# Patient Record
Sex: Male | Born: 1940 | Race: White | Hispanic: No | Marital: Married | Smoking: Former smoker
Health system: Southern US, Community
[De-identification: ages and names within clinical notes are randomized; demographics above are authoritative.]

## PROBLEM LIST (undated history)

## (undated) DIAGNOSIS — F329 Major depressive disorder, single episode, unspecified: Secondary | ICD-10-CM

## (undated) DIAGNOSIS — M1990 Unspecified osteoarthritis, unspecified site: Secondary | ICD-10-CM

## (undated) DIAGNOSIS — K220 Achalasia of cardia: Secondary | ICD-10-CM

## (undated) DIAGNOSIS — K449 Diaphragmatic hernia without obstruction or gangrene: Secondary | ICD-10-CM

## (undated) DIAGNOSIS — I77810 Thoracic aortic ectasia: Secondary | ICD-10-CM

## (undated) DIAGNOSIS — E039 Hypothyroidism, unspecified: Secondary | ICD-10-CM

## (undated) DIAGNOSIS — F32A Depression, unspecified: Secondary | ICD-10-CM

## (undated) DIAGNOSIS — M8580 Other specified disorders of bone density and structure, unspecified site: Secondary | ICD-10-CM

## (undated) DIAGNOSIS — J189 Pneumonia, unspecified organism: Secondary | ICD-10-CM

## (undated) DIAGNOSIS — Z8719 Personal history of other diseases of the digestive system: Secondary | ICD-10-CM

## (undated) DIAGNOSIS — I4510 Unspecified right bundle-branch block: Secondary | ICD-10-CM

## (undated) DIAGNOSIS — I341 Nonrheumatic mitral (valve) prolapse: Secondary | ICD-10-CM

## (undated) DIAGNOSIS — K8020 Calculus of gallbladder without cholecystitis without obstruction: Secondary | ICD-10-CM

## (undated) DIAGNOSIS — I639 Cerebral infarction, unspecified: Secondary | ICD-10-CM

## (undated) DIAGNOSIS — J302 Other seasonal allergic rhinitis: Secondary | ICD-10-CM

## (undated) DIAGNOSIS — Z9889 Other specified postprocedural states: Secondary | ICD-10-CM

## (undated) DIAGNOSIS — R911 Solitary pulmonary nodule: Secondary | ICD-10-CM

## (undated) DIAGNOSIS — G459 Transient cerebral ischemic attack, unspecified: Secondary | ICD-10-CM

## (undated) DIAGNOSIS — E7800 Pure hypercholesterolemia, unspecified: Secondary | ICD-10-CM

## (undated) DIAGNOSIS — K219 Gastro-esophageal reflux disease without esophagitis: Secondary | ICD-10-CM

## (undated) DIAGNOSIS — F419 Anxiety disorder, unspecified: Secondary | ICD-10-CM

## (undated) DIAGNOSIS — Z8379 Family history of other diseases of the digestive system: Secondary | ICD-10-CM

## (undated) DIAGNOSIS — F1021 Alcohol dependence, in remission: Secondary | ICD-10-CM

## (undated) DIAGNOSIS — J449 Chronic obstructive pulmonary disease, unspecified: Secondary | ICD-10-CM

## (undated) HISTORY — DX: Chronic obstructive pulmonary disease, unspecified: J44.9

## (undated) HISTORY — PX: CHOLECYSTECTOMY: SHX55

## (undated) HISTORY — DX: Hypothyroidism, unspecified: E03.9

## (undated) HISTORY — DX: Personal history of other diseases of the digestive system: Z87.19

## (undated) HISTORY — PX: FOOT SURGERY: SHX648

## (undated) HISTORY — DX: Unspecified right bundle-branch block: I45.10

## (undated) HISTORY — DX: Pure hypercholesterolemia, unspecified: E78.00

## (undated) HISTORY — PX: BACK SURGERY: SHX140

## (undated) HISTORY — DX: Solitary pulmonary nodule: R91.1

## (undated) HISTORY — PX: ESOPHAGOSCOPY W/ BOTOX INJECTION: SHX1533

## (undated) HISTORY — DX: Anxiety disorder, unspecified: F41.9

## (undated) HISTORY — DX: Thoracic aortic ectasia: I77.810

## (undated) HISTORY — DX: Pneumonia, unspecified organism: J18.9

## (undated) HISTORY — DX: Other specified postprocedural states: Z98.890

## (undated) HISTORY — DX: Alcohol dependence, in remission: F10.21

## (undated) HISTORY — DX: Nonrheumatic mitral (valve) prolapse: I34.1

## (undated) HISTORY — DX: Family history of other diseases of the digestive system: Z83.79

## (undated) HISTORY — PX: OTHER SURGICAL HISTORY: SHX169

## (undated) HISTORY — DX: Transient cerebral ischemic attack, unspecified: G45.9

## (undated) HISTORY — DX: Achalasia of cardia: K22.0

## (undated) HISTORY — DX: Calculus of gallbladder without cholecystitis without obstruction: K80.20

## (undated) HISTORY — DX: Other seasonal allergic rhinitis: J30.2

## (undated) HISTORY — DX: Other specified disorders of bone density and structure, unspecified site: M85.80

---

## 1997-11-29 ENCOUNTER — Inpatient Hospital Stay (HOSPITAL_COMMUNITY): Admission: RE | Admit: 1997-11-29 | Discharge: 1997-11-30 | Payer: Self-pay | Admitting: *Deleted

## 1998-02-07 ENCOUNTER — Ambulatory Visit (HOSPITAL_COMMUNITY): Admission: RE | Admit: 1998-02-07 | Discharge: 1998-02-07 | Payer: Self-pay

## 1998-02-18 ENCOUNTER — Ambulatory Visit (HOSPITAL_COMMUNITY): Admission: RE | Admit: 1998-02-18 | Discharge: 1998-02-18 | Payer: Self-pay | Admitting: *Deleted

## 1998-03-06 ENCOUNTER — Ambulatory Visit (HOSPITAL_COMMUNITY): Admission: RE | Admit: 1998-03-06 | Discharge: 1998-03-06 | Payer: Self-pay | Admitting: *Deleted

## 1999-10-28 ENCOUNTER — Encounter: Payer: Self-pay | Admitting: Critical Care Medicine

## 1999-10-29 ENCOUNTER — Ambulatory Visit: Admission: RE | Admit: 1999-10-29 | Discharge: 1999-10-29 | Payer: Self-pay | Admitting: Critical Care Medicine

## 1999-11-05 ENCOUNTER — Encounter (HOSPITAL_COMMUNITY): Admission: RE | Admit: 1999-11-05 | Discharge: 2000-02-03 | Payer: Self-pay | Admitting: Critical Care Medicine

## 1999-12-16 ENCOUNTER — Ambulatory Visit (HOSPITAL_COMMUNITY): Admission: RE | Admit: 1999-12-16 | Discharge: 1999-12-16 | Payer: Self-pay | Admitting: Gastroenterology

## 1999-12-16 ENCOUNTER — Encounter: Payer: Self-pay | Admitting: Gastroenterology

## 1999-12-29 ENCOUNTER — Encounter: Admission: RE | Admit: 1999-12-29 | Discharge: 1999-12-29 | Payer: Self-pay | Admitting: *Deleted

## 1999-12-29 ENCOUNTER — Encounter: Payer: Self-pay | Admitting: *Deleted

## 2000-02-04 ENCOUNTER — Encounter (HOSPITAL_COMMUNITY): Admission: RE | Admit: 2000-02-04 | Discharge: 2000-05-04 | Payer: Self-pay | Admitting: Critical Care Medicine

## 2000-02-16 ENCOUNTER — Encounter: Payer: Self-pay | Admitting: General Surgery

## 2000-02-18 ENCOUNTER — Observation Stay (HOSPITAL_COMMUNITY): Admission: RE | Admit: 2000-02-18 | Discharge: 2000-02-19 | Payer: Self-pay | Admitting: General Surgery

## 2000-02-18 ENCOUNTER — Encounter (INDEPENDENT_AMBULATORY_CARE_PROVIDER_SITE_OTHER): Payer: Self-pay | Admitting: Specialist

## 2000-08-05 ENCOUNTER — Encounter: Payer: Self-pay | Admitting: Critical Care Medicine

## 2000-08-05 ENCOUNTER — Ambulatory Visit (HOSPITAL_COMMUNITY): Admission: RE | Admit: 2000-08-05 | Discharge: 2000-08-05 | Payer: Self-pay | Admitting: Critical Care Medicine

## 2002-10-15 DIAGNOSIS — I639 Cerebral infarction, unspecified: Secondary | ICD-10-CM

## 2002-10-15 HISTORY — DX: Cerebral infarction, unspecified: I63.9

## 2002-10-23 ENCOUNTER — Inpatient Hospital Stay (HOSPITAL_COMMUNITY): Admission: EM | Admit: 2002-10-23 | Discharge: 2002-10-24 | Payer: Self-pay | Admitting: Emergency Medicine

## 2002-10-23 ENCOUNTER — Encounter: Payer: Self-pay | Admitting: Emergency Medicine

## 2002-10-24 ENCOUNTER — Encounter: Payer: Self-pay | Admitting: Neurology

## 2003-04-18 ENCOUNTER — Encounter: Payer: Self-pay | Admitting: Internal Medicine

## 2003-04-18 ENCOUNTER — Encounter: Admission: RE | Admit: 2003-04-18 | Discharge: 2003-04-18 | Payer: Self-pay | Admitting: Internal Medicine

## 2004-05-06 ENCOUNTER — Ambulatory Visit (HOSPITAL_COMMUNITY): Admission: RE | Admit: 2004-05-06 | Discharge: 2004-05-06 | Payer: Self-pay | Admitting: Gastroenterology

## 2004-12-09 ENCOUNTER — Ambulatory Visit (HOSPITAL_COMMUNITY): Admission: RE | Admit: 2004-12-09 | Discharge: 2004-12-09 | Payer: Self-pay | Admitting: Gastroenterology

## 2005-07-28 ENCOUNTER — Emergency Department (HOSPITAL_COMMUNITY): Admission: EM | Admit: 2005-07-28 | Discharge: 2005-07-28 | Payer: Self-pay | Admitting: *Deleted

## 2006-07-19 ENCOUNTER — Encounter: Admission: RE | Admit: 2006-07-19 | Discharge: 2006-07-19 | Payer: Self-pay | Admitting: Gastroenterology

## 2006-08-03 ENCOUNTER — Ambulatory Visit (HOSPITAL_COMMUNITY): Admission: RE | Admit: 2006-08-03 | Discharge: 2006-08-03 | Payer: Self-pay | Admitting: Gastroenterology

## 2006-10-26 ENCOUNTER — Ambulatory Visit (HOSPITAL_COMMUNITY): Admission: RE | Admit: 2006-10-26 | Discharge: 2006-10-26 | Payer: Self-pay | Admitting: Gastroenterology

## 2007-07-12 ENCOUNTER — Ambulatory Visit (HOSPITAL_COMMUNITY): Admission: RE | Admit: 2007-07-12 | Discharge: 2007-07-12 | Payer: Self-pay | Admitting: Gastroenterology

## 2007-08-02 ENCOUNTER — Ambulatory Visit (HOSPITAL_COMMUNITY): Admission: RE | Admit: 2007-08-02 | Discharge: 2007-08-02 | Payer: Self-pay | Admitting: Gastroenterology

## 2008-01-24 ENCOUNTER — Ambulatory Visit (HOSPITAL_COMMUNITY): Admission: RE | Admit: 2008-01-24 | Discharge: 2008-01-24 | Payer: Self-pay | Admitting: Gastroenterology

## 2008-09-11 ENCOUNTER — Ambulatory Visit (HOSPITAL_COMMUNITY): Admission: RE | Admit: 2008-09-11 | Discharge: 2008-09-11 | Payer: Self-pay | Admitting: Gastroenterology

## 2009-03-05 ENCOUNTER — Ambulatory Visit (HOSPITAL_COMMUNITY): Admission: RE | Admit: 2009-03-05 | Discharge: 2009-03-05 | Payer: Self-pay | Admitting: Gastroenterology

## 2009-07-29 ENCOUNTER — Ambulatory Visit (HOSPITAL_COMMUNITY): Admission: RE | Admit: 2009-07-29 | Discharge: 2009-07-29 | Payer: Self-pay | Admitting: Gastroenterology

## 2009-12-19 DIAGNOSIS — I10 Essential (primary) hypertension: Secondary | ICD-10-CM

## 2009-12-19 DIAGNOSIS — K219 Gastro-esophageal reflux disease without esophagitis: Secondary | ICD-10-CM

## 2009-12-19 DIAGNOSIS — J449 Chronic obstructive pulmonary disease, unspecified: Secondary | ICD-10-CM

## 2009-12-19 DIAGNOSIS — F329 Major depressive disorder, single episode, unspecified: Secondary | ICD-10-CM

## 2009-12-19 DIAGNOSIS — J301 Allergic rhinitis due to pollen: Secondary | ICD-10-CM

## 2009-12-19 DIAGNOSIS — I080 Rheumatic disorders of both mitral and aortic valves: Secondary | ICD-10-CM

## 2009-12-19 DIAGNOSIS — F419 Anxiety disorder, unspecified: Secondary | ICD-10-CM

## 2009-12-19 DIAGNOSIS — G459 Transient cerebral ischemic attack, unspecified: Secondary | ICD-10-CM | POA: Insufficient documentation

## 2009-12-19 DIAGNOSIS — E785 Hyperlipidemia, unspecified: Secondary | ICD-10-CM

## 2009-12-22 ENCOUNTER — Ambulatory Visit: Payer: Self-pay | Admitting: Critical Care Medicine

## 2009-12-29 ENCOUNTER — Encounter: Payer: Self-pay | Admitting: Critical Care Medicine

## 2010-01-01 ENCOUNTER — Ambulatory Visit (HOSPITAL_COMMUNITY)
Admission: RE | Admit: 2010-01-01 | Discharge: 2010-01-01 | Payer: Self-pay | Source: Home / Self Care | Admitting: Gastroenterology

## 2010-01-06 ENCOUNTER — Telehealth (INDEPENDENT_AMBULATORY_CARE_PROVIDER_SITE_OTHER): Payer: Self-pay | Admitting: *Deleted

## 2010-01-07 ENCOUNTER — Ambulatory Visit: Payer: Self-pay | Admitting: Critical Care Medicine

## 2010-02-03 ENCOUNTER — Ambulatory Visit: Payer: Self-pay | Admitting: Critical Care Medicine

## 2010-04-21 ENCOUNTER — Ambulatory Visit: Payer: Self-pay | Admitting: Critical Care Medicine

## 2010-05-07 ENCOUNTER — Telehealth: Payer: Self-pay | Admitting: Critical Care Medicine

## 2010-05-27 ENCOUNTER — Encounter: Payer: Self-pay | Admitting: Critical Care Medicine

## 2010-06-19 ENCOUNTER — Telehealth: Payer: Self-pay | Admitting: Emergency Medicine

## 2010-07-02 ENCOUNTER — Ambulatory Visit (HOSPITAL_COMMUNITY): Admission: RE | Admit: 2010-07-02 | Discharge: 2010-07-02 | Payer: Self-pay | Admitting: Gastroenterology

## 2010-07-08 ENCOUNTER — Telehealth: Payer: Self-pay | Admitting: Critical Care Medicine

## 2010-07-15 ENCOUNTER — Ambulatory Visit: Payer: Self-pay | Admitting: Critical Care Medicine

## 2010-08-16 HISTORY — PX: OTHER SURGICAL HISTORY: SHX169

## 2010-09-15 ENCOUNTER — Ambulatory Visit
Admission: RE | Admit: 2010-09-15 | Discharge: 2010-09-15 | Payer: Self-pay | Source: Home / Self Care | Attending: Critical Care Medicine | Admitting: Critical Care Medicine

## 2010-09-15 NOTE — Miscellaneous (Signed)
Summary: PFTs from 10/1999   Pulmonary Function Test Date: 10/29/1999 Height (in.): 69 Gender: Male  Pre-Spirometry FVC    Value: 3.56 L/min   Pred: 4.19 L/min     % Pred: 85 % FEV1    Value: 2.13 L     Pred: 3.29 L     % Pred: 65 % FEV1/FVC  Value: 60 %     Pred: 80 %    FEF 25-75  Value: 0.80 L/min   Pred: 3.44 L/min     % Pred: 23 %  Post-Spirometry FVC    Value: 3.64 L/min   Pred: 4.19 L/min     % Pred: 87 % FEV1    Value: 2.05 L     Pred: 3.29 L     % Pred: 62 % FEV1/FVC  Value: 56 %     Pred: 80 %    FEF 25-75  Value: 0.69 L/min   Pred: 3.44 L/min     % Pred: 20 %  Lung Volumes TLC    Value: 6.27 L   % Pred: 98 % RV    Value: 2.71 L   % Pred: 118 % DLCO    Value: 14.3 %   % Pred: 62 % DLCO/VA  Value: 2.94 %   % Pred: 78 % Clinical Lists Changes  Observations: Added new observation of DLCO/VA%EXP: 78 % (12/29/2009 14:54) Added new observation of DLCO/VA: 2.94 % (12/29/2009 14:54) Added new observation of DLCO % EXPEC: 62 % (12/29/2009 14:54) Added new observation of DLCO: 14.3 % (12/29/2009 14:54) Added new observation of RV % EXPECT: 118 % (12/29/2009 14:54) Added new observation of RV: 2.71 L (12/29/2009 14:54) Added new observation of TLC % EXPECT: 98 % (12/29/2009 14:54) Added new observation of TLC: 6.27 L (12/29/2009 14:54) Added new observation of FEF2575%EXPS: 20 % (12/29/2009 14:54) Added new observation of PSTFEF25/75P: 3.44  (12/29/2009 14:54) Added new observation of PSTFEF25/75%: 0.69 L/min (12/29/2009 14:54) Added new observation of FEV1FVCPRDPS: 80 % (12/29/2009 14:54) Added new observation of PSTFEV1/FVC: 56 % (12/29/2009 14:54) Added new observation of POSTFEV1%PRD: 62 % (12/29/2009 14:54) Added new observation of FEV1PRDPST: 3.29 L (12/29/2009 14:54) Added new observation of POST FEV1: 2.05 L/min (12/29/2009 14:54) Added new observation of POST FVC%EXP: 87 % (12/29/2009 14:54) Added new observation of FVCPRDPST: 4.19 L/min (12/29/2009 14:54) Added new  observation of POST FVC: 3.64 L (12/29/2009 14:54) Added new observation of FEF % EXPEC: 23 % (12/29/2009 14:54) Added new observation of FEF25-75%PRE: 3.44 L/min (12/29/2009 14:54) Added new observation of FEF 25-75%: 0.80 L/min (12/29/2009 14:54) Added new observation of FEV1/FVC PRE: 80 % (12/29/2009 14:54) Added new observation of FEV1/FVC: 60 % (12/29/2009 14:54) Added new observation of FEV1 % EXP: 65 % (12/29/2009 14:54) Added new observation of FEV1 PREDICT: 3.29 L (12/29/2009 14:54) Added new observation of FEV1: 2.13 L (12/29/2009 14:54) Added new observation of FVC % EXPECT: 85 % (12/29/2009 14:54) Added new observation of FVC PREDICT: 4.19 L (12/29/2009 14:54) Added new observation of FVC: 3.56 L (12/29/2009 14:54) Added new observation of PFT HEIGHT: 69  (12/29/2009 14:54) Added new observation of PFT DATE: 10/29/1999  (12/29/2009 14:54)

## 2010-09-15 NOTE — Progress Notes (Signed)
Summary: Alpha 1 test results  Phone Note Outgoing Call   Call placed by: Gweneth Dimitri RN,  May 07, 2010 9:09 AM Call placed to: Patient Summary of Call: Alpha 1 test results received.  Results  MM --  normal.   Pt aware test results normal and verbalized his understanding.   Initial call taken by: Gweneth Dimitri RN,  May 07, 2010 9:11 AM

## 2010-09-15 NOTE — Miscellaneous (Signed)
Summary: Alpha 1 Antitrypsin Results  Clinical Lists Changes  Observations: Added new observation of A-1 ANTITRYP: MM (05/27/2010 12:01)

## 2010-09-15 NOTE — Progress Notes (Signed)
Summary: [facility]  [facility]   Imported By: Lester Maybee 01/05/2010 07:29:01  _____________________________________________________________________  External Attachment:    Type:   Image     Comment:   External Document

## 2010-09-15 NOTE — Progress Notes (Signed)
Summary: yellow mucus / cough  Phone Note Call from Patient Call back at [phone]   Caller: Patient Call For: wright Summary of Call: yellow mucus tickling  in throat with  cough [facility] [address] Initial call taken by: Rickard Patience,  Jan 06, 2010 1:47 PM  Follow-up for Phone Call        called and spoke with pt. pt states he saw PW for a pulmonary consult on 12-22-2009.  Pt now c/o tickle in throat and states he "coughs so hard it makes his head hurt."  Pt states he will cough up yellow sputum but states it's difficult to get up.  Pt also c/o nasal congestion and yellow nasal drainage.  Pt denies increased sob.  Pt states PW also wanted a copy of CXR on a disc for him to review.  Pt states he is going to pick up disc this afternoon.  I offered pt an appt today with TP to address cough/congestion.  Pt was ok to wait until tomorrow and see PW instead so he can also have PW look at cxr films.  Pt scheduled to see PW tomorrow at 3:20pm.  Arman Filter LPN  Jan 06, 2010 2:42 PM

## 2010-09-15 NOTE — Assessment & Plan Note (Signed)
Summary: Pulmonary OV   Copy to:  Dr. Kirby Funk Primary Provider/Referring Provider:  Dr. Kirby Funk  CC:  Acute Visit.  On Aug 29 and 30 and pt c/o having high fever and coughing up a lot of mucus with streaks of bright red blood.  Still coughing - now prod with yellow mucus and having hoarseness.  No changes in breathing.Chris Lewis  History of Present Illness: Pulmonary OV       This is a 70 year old man with COPD   April 21, 2010 1:37 PM After mowing and weedeating started coughing and felt congested.  Had fever, then got better but still cough but not bloody, mucus is now yellow.  Sinuses are draining, and redness to membranes.  No fever now.  Pt getting botox injections for reflux and esophageal spasticity.     Preventive Screening-Counseling & Management  Alcohol-Tobacco     Smoking Status: quit > 6 months     Year Quit: 1999     Pack years: 30  Current Medications (verified): 1)  Dexilant 60 Mg Cpdr (Dexlansoprazole) .Chris Lewis.. 1 At Bedtime 2)  Klonopin 0.5 Mg Tabs (Clonazepam) .Chris Lewis.. 1 At Bedtime 3)  Caltrate 600+d Plus 600-400 Mg-Unit Tabs (Calcium Carbonate-Vit D-Min) .Chris Lewis.. 1 Two Times A Day 4)  Lutein 6 Mg Caps (Lutein) .Chris Lewis.. 1 Once Daily 5)  Multivitamins  Tabs (Multiple Vitamin) .Chris Lewis.. 1 Once Daily 6)  Aspirin 81 Mg Tbec (Aspirin) .Chris Lewis.. 1 Once Daily 7)  Metrogel 1 % Gel (Metronidazole) .... As Directed 8)  Simvastatin 40 Mg Tabs (Simvastatin) .... Take 1 Tablet By Mouth Once A Day 9)  Tums E-X 750 750 Mg Chew (Calcium Carbonate Antacid) .... As Needed 10)  Spiriva Handihaler 18 Mcg  Caps (Tiotropium Bromide Monohydrate) .... Two Puffs in Handihaler Daily 11)  Ventolin Hfa 108 (90 Base) Mcg/act  Aers (Albuterol Sulfate) .Chris Lewis.. 1-2 Puffs Every 4-6 Hours As Needed 12)  Benzonatate 100 Mg Caps (Benzonatate) .... Take One - Two By Mouth Every 4-6 Hours As Needed Cough 13)  Fluticasone Propionate 50 Mcg/act Susp (Fluticasone Propionate) .... Two Sprays Each Nostril Daily 14)  Astepro  0.15 % Soln (Azelastine Hcl) .... Two Sprays Each Nostril Daily  Allergies (verified): 1)  ! Lipitor (Atorvastatin) 2)  ! * Ivp Dye 3)  * Crestor  Past History:  Past medical, surgical, family and social histories (including risk factors) reviewed, and no changes noted (except as noted below).  Past Medical History: Reviewed history from 12/22/2009 and no changes required. 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 Cholecystectomy 2001 back surgery x 3 hernia surgery 1959 hernia repair 2005 left foot surgery 2009  Family History: Reviewed history from 12/22/2009 and no changes required. asthma- son heart disease - father, sister Lung Ca - sister  Social History: Reviewed history from 12/22/2009 and no changes required. Patient states former smoker.  Quit in Jan 1999.  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  The patient denies shortness of breath at rest, non-productive cough, coughing up blood, irregular heartbeats, indigestion, loss of appetite, weight change, abdominal pain,  difficulty swallowing, sore throat, tooth/dental problems, headaches, sneezing, itching, ear ache, anxiety, depression, hand/feet swelling, joint stiffness or pain, rash, and fever.    Vital Signs:  Patient profile:   71 year old male Height:      69 inches Weight:      170.50 pounds BMI:     25.27 O2 Sat:      96 % on Room air Temp:     98.1 degrees F  oral Pulse rate:   61 / minute BP sitting:   118 / 84  (left arm) Cuff size:   regular  Vitals Entered By: Gweneth Dimitri RN (April 21, 2010 1:30 PM)  O2 Flow:  Room air CC: Acute Visit.  On Aug 29 and 30, pt c/o having high fever and coughing up a lot of mucus with streaks of bright red blood.  Still coughing - now prod with yellow mucus and having hoarseness.  No changes in breathing. Comments Medications reviewed with patient Daytime contact number verified with patient. Gweneth Dimitri RN  April 21, 2010 1:30 PM    Physical Exam  Additional Exam:  Gen: Pleasant, well-nourished, in no distress,  normal affect ENT: No lesions,  mouth severe erythema,  oropharynx clear, no postnasal drip Neck: No JVD, no TMG, no carotid bruits Lungs: No use of accessory muscles, no dullness to percussion, few exp wheezes, distant BS Cardiovascular: RRR, heart sounds normal, no murmur or gallops, no peripheral edema Abdomen: soft and NT, no HSM,  BS normal, abd protuberant Musculoskeletal: No deformities, no cyanosis or clubbing Neuro: alert, non focal Skin: Warm, no lesions or rashes    Impression & Recommendations:  Problem # 1:  COPD (ICD-496) Assessment Deteriorated copd with acute bronchitic flare plan pulse prednisone for 8days avelox x 5days flu vaccine today chk alpha one antitrypsin assay   Medications Added to Medication List This Visit: 1)  Avelox 400 Mg Tabs (Moxifloxacin hcl) .... By mouth daily 2)  Prednisone 10 Mg Tabs (Prednisone) .... Take as directed take 4 daily for two days, then 3 daily for two days, then two daily for two days then one daily for two days then stop  Complete Medication List: 1)  Dexilant 60 Mg Cpdr (Dexlansoprazole) .Chris Lewis.. 1 at bedtime 2)  Klonopin 0.5 Mg Tabs (Clonazepam) .Chris Lewis.. 1 at bedtime 3)  Caltrate 600+d Plus 600-400 Mg-unit Tabs (Calcium carbonate-vit d-min) .Chris Lewis.. 1 two times a day 4)  Lutein 6 Mg Caps (Lutein) .Chris Lewis.. 1 once daily 5)   Multivitamins Tabs (Multiple vitamin) .Chris Lewis.. 1 once daily 6)  Aspirin 81 Mg Tbec (Aspirin) .Chris Lewis.. 1 once daily 7)  Metrogel 1 % Gel (Metronidazole) .... As directed 8)  Simvastatin 40 Mg Tabs (Simvastatin) .... Take 1 tablet by mouth once a day 9)  Tums E-x 750 750 Mg Chew (Calcium carbonate antacid) .... As needed 10)  Spiriva Handihaler 18 Mcg Caps (Tiotropium bromide monohydrate) .... Two puffs in handihaler daily 11)  Ventolin Hfa 108 (90 Base) Mcg/act Aers (Albuterol sulfate) .Chris Lewis.. 1-2 puffs every 4-6 hours as needed 12)  Benzonatate 100 Mg Caps (Benzonatate) .... Take one - two by mouth every 4-6 hours as needed cough 13)  Fluticasone Propionate 50 Mcg/act Susp (Fluticasone propionate) .... Two sprays each nostril daily 14)  Astepro 0.15 % Soln (Azelastine hcl) .... Two sprays each nostril daily 15)  Avelox 400 Mg Tabs (Moxifloxacin hcl) .... By mouth daily 16)  Prednisone 10 Mg Tabs (Prednisone) .... Take as directed take 4 daily  for two days, then 3 daily for two days, then two daily for two days then one daily for two days then stop  Other Orders: Est. Patient Level IV (04540) Admin 1st Vaccine (98119) Flu Vaccine 4yrs + (14782)  Patient Instructions: 1)  A flu vaccine will be obtained 2)  We will check the alpha one antitrypsin test 3)  Prednisone 10mg  Take 4 daily for two days, then 3 daily for two days, then two daily for two days then one daily for two days then stop 4)  Avelox one daily for 5 days (4 sent to pharmacy plus one sample 5)  No other medication change 6)  Return 2 months Prescriptions: PREDNISONE 10 MG  TABS (PREDNISONE) Take as directed Take 4 daily for two days, then 3 daily for two days, then two daily for two days then one daily for two days then stop  #20 x 0   Entered and Authorized by:   Storm Frisk MD   Signed by:   Storm Frisk MD on 04/21/2010   Method used:   [facility]ronically to        [id number]       [address]        [place], [place]  [address]       Ph: [phone] or [phone]       Fax: [phone]   RxID:   [id number] AVELOX 400 MG  TABS (MOXIFLOXACIN HCL) By mouth daily  #4 x 0   Entered and Authorized by:   Storm Frisk MD   Signed by:   Storm Frisk MD on 04/21/2010   Method used:   [facility] to        [id number]       [address]       [place], [place]  [address]       Ph: [phone] or [phone]       Fax: [phone]   RxID:   [id number]    Immunization History:  Influenza Immunization History:    Influenza:  historical (05/16/2009)   Prevention & Chronic Care Immunizations   Influenza vaccine: Fluvax 3+  (04/21/2010)    Tetanus booster: Not documented    Pneumococcal vaccine: Not documented    H. zoster vaccine: Not documented  Colorectal Screening   Hemoccult: Not documented    Colonoscopy: Not documented  Other Screening   PSA: Not documented   Smoking status: quit > 6 months  (04/21/2010)  Lipids   Total Cholesterol: Not documented   LDL: Not documented   LDL Direct: Not documented   HDL: Not documented   Triglycerides: Not documented    SGOT (AST): Not documented   SGPT (ALT): Not documented   Alkaline phosphatase: Not documented   Total bilirubin: Not documented  Hypertension   Last Blood Pressure: 118 / 84  (04/21/2010)   Serum creatinine: Not documented   Serum potassium Not documented  Self-Management Support :    Hypertension self-management support: Not documented    Lipid self-management support: Not documented    Nursing Instructions: Give Flu vaccine today    Flu Vaccine Consent Questions     Do you have a history of severe allergic reactions to this vaccine? no    Any prior history of allergic reactions to egg and/or gelatin? no    Do you have  a sensitivity to the preservative Thimersol? no    Do you have a past history of Guillan-Barre  Syndrome? no    Do you currently have an acute febrile illness? no    Have you ever had a severe reaction to latex? no    Vaccine information given and explained to patient? yes    Are you currently pregnant? no    Lot Number:[id number]   Exp Date:02/13/2011   Site Given  Left [email or web address].sign  IM .lbflu Gweneth Dimitri RN  April 21, 2010 2:09 PM  Appended Document: Pulmonary OV fax Jonny Ruiz griffin

## 2010-09-15 NOTE — Progress Notes (Signed)
Summary: congestion  Phone Note Call from Patient   Caller: Patient Call For: Roise Emert Summary of Call: head congestion yellow and green. he is not on antibiotic . [facility] [address] Initial call taken by: Rickard Patience,  July 08, 2010 10:38 AM  Follow-up for Phone Call        The patient c/o recurring head and chest congestion. Mucus is yellow to green in color x2-3 days. He denies any increased SOB, wheezing, sore throat of fever. He is using loratadine 10mg  as needed and has started back on fluticasone and astepro daily. Pt is sch for OV with PW on Wed., 11/30.Marland Kitchen He wants to know if he should have Pred taper or abx before OV. Please advise. Follow-up by: Michel Bickers CMA,  July 08, 2010 10:58 AM  Additional Follow-up for Phone Call Additional follow up Details #1::        rx avelox 400mg /d x 5days prednisone 10mg  Take 4 daily for two days, then 3 daily for two days, then two daily for two days then one daily for two days then stop keep 11/30 ov  Additional Follow-up by: Storm Frisk MD,  July 08, 2010 1:14 PM    Additional Follow-up for Phone Call Additional follow up Details #2::    pt advised of recs and to keep appt. rx sent. Carron Curie CMA  July 08, 2010 1:20 PM   New/Updated Medications: PREDNISONE 10 MG TABS (PREDNISONE) Take 4 daily for two days, then 3 daily for two days, then two daily for two days then one daily for two days then stop AVELOX 400 MG TABS (MOXIFLOXACIN HCL) Take 1 tablet by mouth once a day Prescriptions: AVELOX 400 MG TABS (MOXIFLOXACIN HCL) Take 1 tablet by mouth once a day  #5 x 0   Entered by:   Carron Curie CMA   Authorized by:   Storm Frisk MD   Signed by:   Carron Curie CMA on 07/08/2010   Method used:   [facility]ically to        [id number]       [address]       [place], [place]  [address]       Ph: [phone] or [phone]       Fax: [phone]   RxID:    [id number] PREDNISONE 10 MG TABS (PREDNISONE) Take 4 daily for two days, then 3 daily for two days, then two daily for two days then one daily for two days then stop  #20 x 0   Entered by:   Carron Curie CMA   Authorized by:   Storm Frisk MD   Signed by:   Carron Curie CMA on 07/08/2010   Method used:   [facility]ically to        [id number]       [address]       [place], [place]  [address]       Ph: [phone] or [phone]       Fax: [phone]   RxID:   [id number]

## 2010-09-15 NOTE — Assessment & Plan Note (Signed)
Summary: Pulmonary OV   Copy to:  Dr. Kirby Funk Primary Montee Tallman/Referring Raegan Sipp:  Dr. Kirby Funk  CC:  Follow up.  chest congestion - difficult to get up and affects breathing.  also c/o dull chest pain - worse this am..  History of Present Illness: Pulmonary OV       This is a 70 year old man with COPD    July 15, 2010 2:28 PM Was getting better, then awoke 4am with diarrhea and hurt all over.   Wife is ill as well. Also other family members with same illness. Was ok prior to this. Called in on 11/23 and rx pred and avelox and this helped.  Notes dry eyes. Just had botox injection of esophagus on 07/02/10 Dysphagia is better but heartburn is worse     Current Medications (verified): 1)  Dexilant 60 Mg Cpdr (Dexlansoprazole) .Marland Kitchen.. 1 At Bedtime 2)  Klonopin 0.5 Mg Tabs (Clonazepam) .Marland Kitchen.. 1 At Bedtime 3)  Caltrate 600+d Plus 600-400 Mg-Unit Tabs (Calcium Carbonate-Vit D-Min) .Marland Kitchen.. 1 Two Times A Day 4)  Lutein 6 Mg Caps (Lutein) .Marland Kitchen.. 1 Once Daily 5)  Multivitamins  Tabs (Multiple Vitamin) .Marland Kitchen.. 1 Once Daily 6)  Aspirin 81 Mg Tbec (Aspirin) .Marland Kitchen.. 1 Once Daily 7)  Metrogel 1 % Gel (Metronidazole) .... As Directed 8)  Simvastatin 40 Mg Tabs (Simvastatin) .... Take 1 Tablet By Mouth Once A Day 9)  Tums E-X 750 750 Mg Chew (Calcium Carbonate Antacid) .... As Needed 10)  Spiriva Handihaler 18 Mcg  Caps (Tiotropium Bromide Monohydrate) .... Two Puffs in Handihaler Daily 11)  Ventolin Hfa 108 (90 Base) Mcg/act  Aers (Albuterol Sulfate) .Marland Kitchen.. 1-2 Puffs Every 4-6 Hours As Needed 12)  Benzonatate 100 Mg Caps (Benzonatate) .... Take One - Two By Mouth Every 4-6 Hours As Needed Cough 13)  Fluticasone Propionate 50 Mcg/act Susp (Fluticasone Propionate) .... Two Sprays Each Nostril Daily 14)  Astepro 0.15 % Soln (Azelastine Hcl) .... Two Sprays Each Nostril Daily 15)  Prednisone 10 Mg Tabs (Prednisone) .... Take 4 Daily For Two Days, Then 3 Daily For Two Days, Then Two Daily For Two Days  Then One Daily For Two Days Then Stop 16)  Avelox 400 Mg Tabs (Moxifloxacin Hcl) .... Take 1 Tablet By Mouth Once A Day 17)  Loratadine 10 Mg Tabs (Loratadine) .... Take 1 Tablet By Mouth Once A Day  Allergies (verified): 1)  ! Lipitor (Atorvastatin) 2)  ! * Ivp Dye 3)  * Crestor  Past History:  Past medical, surgical, family and social histories (including risk factors) reviewed, and no changes noted (except as noted below).  Past Medical History: Reviewed history from 12/22/2009 and no changes required. 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 Cholecystectomy 2001 back surgery x 3 hernia surgery 1959 hernia repair 2005 left foot surgery 2009  Clinical Reports Reviewed:  CXR:  11/04/2009: CXR Results:  Left base atelectasis,  chronic interstitial changes  PFT's:  12/29/2009: DLCO %Predicted:  62 FEF 25/75 %Predicted:  23 FEV1 %Predicted:  65 FVC %Predicted:  85 Post Spirometry FEF 25/75 %Predicted:  20 Post Spirometry FEV1 %Predicted:  62 Post Spirometry FVC %Predicted:  87 RV %Predicted:  118 TLC %Predicted:  98  12/22/2009: FEV1 %Predicted:  38 FEV1/FVC %Predicted:  63.70 FVC %Predicted:  59.40   Family  History: Reviewed history from 12/22/2009 and no changes required. asthma- son heart disease - father, sister Lung Ca - sister  Social History: Reviewed history from 12/22/2009 and no changes required. Patient states former smoker.  Quit in Jan 1999.  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  The patient denies non-productive cough, coughing up blood, irregular heartbeats, loss of appetite, weight change, tooth/dental problems, headaches, nasal congestion/difficulty breathing through nose, sneezing, itching, ear ache, anxiety, depression, hand/feet swelling, joint stiffness or pain, rash, change in color of mucus, and fever.    Vital Signs:  Patient profile:   70 year old male Height:      69 inches Weight:      173.38 pounds BMI:     25.70 O2 Sat:      95 % on Room air Temp:     98.2 degrees F oral Pulse rate:   70 / minute BP sitting:   114 / 80  (left arm) Cuff size:   regular  Vitals Entered By: Gweneth Dimitri RN (July 15, 2010 2:11 PM)  O2 Flow:  Room air CC: Follow up.  chest congestion - difficult to get up and affects breathing.  also c/o dull chest pain - worse this am. Comments Medications reviewed with patient Daytime contact number verified with patient. Gweneth Dimitri RN  July 15, 2010 2:13 PM    Physical Exam  Additional Exam:  Gen: Pleasant, well-nourished, in no distress,  normal affect ENT: No lesions,  mouth severe erythema,  oropharynx clear, no postnasal drip Neck: No JVD, no TMG, no carotid bruits Lungs: No use of accessory muscles, no dullness to percussion, few exp wheezes, distant BS Cardiovascular: RRR, heart sounds normal, no murmur or gallops, no peripheral edema Abdomen: soft and NT, no HSM,  BS normal, abd protuberant Musculoskeletal: No deformities, no cyanosis or clubbing Neuro: alert, non focal Skin: Warm, no lesions or rashes    Impression & Recommendations:  Problem # 1:  COPD (ICD-496) copd exacerb ppt by gerd and microaspiration plan finish current avelox/pred RX cont BDs  Problem # 2:  ESOPHAGEAL REFLUX (ICD-530.81) Assessment: Deteriorated esophageal dysfunction better with botox but GERD worse with  recent dietary indiscretion /noncompliance plan double dexilant until samples run out (10days samples given)  His updated medication list for this problem includes:    Dexilant 60 Mg Cpdr (Dexlansoprazole) .Marland Kitchen... 1 at bedtime    Tums E-x 750 750 Mg Chew (Calcium carbonate antacid) .Marland Kitchen... As needed  Orders: Est. Patient Level IV (11914)  Medications Added to Medication List This Visit: 1)  Prednisone 10 Mg Tabs (Prednisone) .... Take 4 daily for two days, then 3 daily for two days, then two daily for two days then one daily for two days then stop 2)  Avelox 400 Mg Tabs (Moxifloxacin hcl) .... Take 1 tablet by mouth once a day 3)  Loratadine 10 Mg Tabs (Loratadine) .... Take 1 tablet by mouth once a day  Patient Instructions: 1)  Finish Avelox and prednisone 2)  Follow STRICT Reflux Diet 3)  Double Dexilant one twice a day until samples gone ,then resume one daily 4)  No change in inhaled medications. 5)  Stop loratidine 6)  Stay on fluticasone/astepro 7)  Return 2 months  Appended Document: Pulmonary OV fax john griffin; Dr Randa Evens Enid Baas

## 2010-09-15 NOTE — Assessment & Plan Note (Signed)
Summary: Pulmonary OV   Copy to:  Dr. Kirby Funk Primary Provider/Referring Provider:  Dr. Kirby Funk  CC:  2 wk follow up.  Pt states he is feeling better.  Cough and tickle in throat have improved. Chris Lewis  History of Present Illness: Pulmonary OV       This is a 70 year old man with COPD   Dx with Copd.   since 2002.  Seen by Dr Delford Field 2003, records not available on this OV. PFT[facility] at [facility]  2002 ? results but showed moderate obstruction Was doing well, until resp  virus issue 2/11.  Two grandchildren ill and pt became ill.  Now not getting over the situation.  Worse since that time. Has been rx with zpak, pred, levaquin and now amoxicillin. ([facility]) Pt notes strep screen was neg.  Now on amoxicillin Not able to eat or swallow for two days,  now throat is better was rx spiriva 2 weeks and then stopped.  ? if helped Pt denies any significant sore throat, nasal congestion or excess secretions, fever, chills, sweats, unintended weight loss, pleurtic or exertional chest pain, orthopnea PND, or leg swelling Pt denies any increase in rescue therapy over baseline, denies waking up needing it or having any early am or nocturnal exacerbations of coughing/wheezing/or dyspnea. Pt also denies any obvious fluctuation in symptoms wiht weather or environmental change or other alleviating or aggravating factors  Pt with hx of severe GERD and has had multiple botox injections into distal esoph sphincter per GI MD. Still witn ongoing GERD symptoms and now on dexilant.  He is noncompliant with diet    Jan 07, 2010 3:28 PM Now is coughing more.  The pt notes a tickles in throat.   There is now more yellow mucous.   The pt had botox injection last week into distal esophagus. The pt is using cough drops,  The pt notes  if coughs hard and nose will run and is yellow out of nose,  had pain in L ear. Dyspnea is better compared to prior OV. Pt denies any significant sore throat, nasal congestion  , fever, chills, sweats, unintended weight loss, pleurtic or exertional chest pain, orthopnea PND, or leg swelling  February 03, 2010 2:09 PM At the last ov we rec: Flonase/nasonex two sprays each nostril daily Astepro two sprays each nostril daily Cyclic cough protocol A depomedrol 120mg  injection will be given Augmentin one twice daily for 7days Now cough is less,  still with some mucus that comes up.  Dyspnea is better unless if exerts self excessively The botox not helping as much.  Still has dysphagia despite botox.    Preventive Screening-Counseling & Management  Alcohol-Tobacco     Smoking Status: quit > 6 months  Current Medications (verified): 1)  Dexilant 60 Mg Cpdr (Dexlansoprazole) .Chris Lewis.. 1 At Bedtime 2)  Klonopin 0.5 Mg Tabs (Clonazepam) .Chris Lewis.. 1 At Bedtime 3)  Caltrate 600+d Plus 600-400 Mg-Unit Tabs (Calcium Carbonate-Vit D-Min) .Chris Lewis.. 1 Two Times A Day 4)  Lutein 6 Mg Caps (Lutein) .Chris Lewis.. 1 Once Daily 5)  Lexapro 10 Mg Tabs (Escitalopram Oxalate) .Chris Lewis.. 1 Once Daily 6)  Multivitamins  Tabs (Multiple Vitamin) .Chris Lewis.. 1 Once Daily 7)  Aspirin 81 Mg Tbec (Aspirin) .Chris Lewis.. 1 Once Daily 8)  Metrogel 1 % Gel (Metronidazole) .... As Directed 9)  Simvastatin 40 Mg Tabs (Simvastatin) .... Take 1 Tablet By Mouth Once A Day 10)  Tums E-X 750 750 Mg Chew (Calcium Carbonate Antacid) .Chris KitchenMarland KitchenMarland Lewis  As Needed 11)  Spiriva Handihaler 18 Mcg  Caps (Tiotropium Bromide Monohydrate) .... Two Puffs in Handihaler Daily 12)  Proair Hfa 108 (90 Base) Mcg/act  Aers (Albuterol Sulfate) .Chris Lewis.. 1-2 Puffs Every 4-6 Hours As Needed 13)  Benzonatate 100 Mg Caps (Benzonatate) .... Take One - Two By Mouth Every 4-6 Hours As Needed Cough 14)  Fluticasone Propionate 50 Mcg/act Susp (Fluticasone Propionate) .... Two Sprays Each Nostril Daily 15)  Astepro 0.15 % Soln (Azelastine Hcl) .... Two Sprays Each Nostril Daily  Allergies (verified): 1)  ! Lipitor (Atorvastatin) 2)  ! * Ivp Dye 3)  * Crestor  Past History:  Past medical,  surgical, family and social histories (including risk factors) reviewed, and no changes noted (except as noted below).  Past Medical History: Reviewed history from 12/22/2009 and no changes required. 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 Cholecystectomy 2001 back surgery x 3 hernia surgery 1959 hernia repair 2005 left foot surgery 2009  Family History: Reviewed history from 12/22/2009 and no changes required. asthma- son heart disease - father, sister Lung Ca - sister  Social History: Reviewed history from 12/22/2009 and no changes required. Patient states former smoker.  Quit in Jan 1999.  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  The patient denies shortness of breath at rest, productive cough, non-productive cough, coughing up blood, chest pain, irregular heartbeats, acid heartburn, indigestion, loss of appetite, weight change, abdominal pain, difficulty swallowing, sore throat, tooth/dental problems, headaches, nasal congestion/difficulty breathing through nose, sneezing, itching, ear ache, anxiety, depression, hand/feet swelling, joint stiffness or pain, rash, change in color of mucus, and fever.    Vital Signs:  Patient profile:   70 year old male Height:      69 inches Weight:      172 pounds BMI:     25.49 O2 Sat:      93 % on Room air Temp:     98.0 degrees F oral Pulse rate:   70 / minute BP sitting:   120 / 84  (right arm) Cuff size:   regular  Vitals Entered By: Gweneth Dimitri RN (February 03, 2010 2:03 PM)  O2 Flow:  Room air CC: 2 wk  follow up.  Pt states he is feeling better.  Cough and tickle in throat have improved.  Comments Medications reviewed with patient Daytime contact number verified with patient. Gweneth Dimitri RN  February 03, 2010 2:04 PM    Physical Exam  Additional Exam:  Gen: Pleasant, well-nourished, in no distress,  normal affect ENT: No lesions,  mouth severe erythema,  oropharynx clear, no postnasal drip Neck: No JVD, no TMG, no carotid bruits Lungs: No use of accessory muscles, no dullness to percussion, clear without rales or rhonchi, distant BS Cardiovascular: RRR, heart sounds normal, no murmur or gallops, no peripheral edema Abdomen: soft and NT, no HSM,  BS normal, abd protuberant Musculoskeletal: No deformities, no cyanosis or clubbing Neuro: alert, non focal Skin: Warm, no lesions or rashes    Impression & Recommendations:  Problem # 1:  COPD (ICD-496) Assessment Improved  Moderate COPD Golds stage III with  GERD as a major exacerbating feature.  Not oxygen dependent, improved  with reflux rx  plan reflux diet adherence cont spiriva cont dexilant daily   Problem # 2:  ALLERGIC RHINITIS, SEASONAL (ICD-477.0) Assessment: Improved stable allergic rhinitis plan cont astepro/flonase  Medications Added to Medication List This Visit: 1)  Simvastatin 40 Mg Tabs (Simvastatin) .... Take 1 tablet by mouth once a day 2)  Ventolin Hfa 108 (90 Base) Mcg/act Aers (Albuterol sulfate) .Chris Lewis.. 1-2 puffs every 4-6 hours as needed  Complete Medication List: 1)  Dexilant 60 Mg Cpdr (Dexlansoprazole) .Chris Lewis.. 1 at bedtime 2)  Klonopin 0.5 Mg Tabs (Clonazepam) .Chris Lewis.. 1 at bedtime 3)  Caltrate 600+d Plus 600-400 Mg-unit Tabs (Calcium carbonate-vit d-min) .Chris Lewis.. 1 two times a day 4)  Lutein 6 Mg Caps (Lutein) .Chris Lewis.. 1 once daily 5)  Lexapro 10 Mg Tabs (Escitalopram oxalate) .Chris Lewis.. 1 once daily 6)  Multivitamins Tabs (Multiple vitamin) .Chris Lewis.. 1 once daily 7)  Aspirin 81 Mg Tbec (Aspirin) .Chris Lewis.. 1 once daily 8)   Metrogel 1 % Gel (Metronidazole) .... As directed 9)  Simvastatin 40 Mg Tabs (Simvastatin) .... Take 1 tablet by mouth once a day 10)  Tums E-x 750 750 Mg Chew (Calcium carbonate antacid) .... As needed 11)  Spiriva Handihaler 18 Mcg Caps (Tiotropium bromide monohydrate) .... Two puffs in handihaler daily 12)  Ventolin Hfa 108 (90 Base) Mcg/act Aers (Albuterol sulfate) .Chris Lewis.. 1-2 puffs every 4-6 hours as needed 13)  Benzonatate 100 Mg Caps (Benzonatate) .... Take one - two by mouth every 4-6 hours as needed cough 14)  Fluticasone Propionate 50 Mcg/act Susp (Fluticasone propionate) .... Two sprays each nostril daily 15)  Astepro 0.15 % Soln (Azelastine hcl) .... Two sprays each nostril daily  Other Orders: Est. Patient Level III (04540)  Patient Instructions: 1)  No change in medications 2)  Return in   4       months Prescriptions: VENTOLIN HFA 108 (90 BASE) MCG/ACT  AERS (ALBUTEROL SULFATE) 1-2 puffs every 4-6 hours as needed  #1 x 6   Entered and Authorized by:   Storm Frisk MD   Signed by:   Storm Frisk MD on 02/03/2010   Method used:   Print then Give to Patient   RxID:   [id number]

## 2010-09-15 NOTE — Assessment & Plan Note (Signed)
Summary: Pulmonary Consultation   Copy to:  Dr. Kirby Funk Primary Provider/Referring Provider:  Dr. Kirby Funk  CC:  Pulmonary Consult for COPD.  and COPD initial evaluation.  History of Present Illness: Pulmonary Consultation       This is a 70 year old man who presents for COPD initial evaluation.  The patient complains of history of diagnosed COPD, shortness of breath, chest tightness, chest pain worse with breathing and coughing, cough, mucous production, nocturnal awakening, and congestion, but denies wheezing and exercise induced symptoms.  Prior evaluation and testing has included Cxr.  The dyspnea is described as with ADL's, with walking stairs, and during the day.  Associated disease(s) include(s) difficulty swallowing, sore throat, change in color of mucous, productive cough, non-productive cough, chest pain, and chest tightness.  Prior effective treatment has included tiatropium.    Dx with Copd.   since 2002.  Seen by Dr Delford Field 2003, records not available on this OV. PFTs at [facility]  2002 ? results but showed moderate obstruction Was doing well, until resp  virus issue 2/11.  Two grandchildren ill and pt became ill.  Now not getting over the situation.  Worse since that time. Has been rx with zpak, pred, levaquin and now amoxicillin. ([facility]) Pt notes strep screen was neg.  Now on amoxicillin Not able to eat or swallow for two days,  now throat is better was rx spiriva 2 weeks and then stopped.  ? if helped Pt denies any significant sore throat, nasal congestion or excess secretions, fever, chills, sweats, unintended weight loss, pleurtic or exertional chest pain, orthopnea PND, or leg swelling Pt denies any increase in rescue therapy over baseline, denies waking up needing it or having any early am or nocturnal exacerbations of coughing/wheezing/or dyspnea. Pt also denies any obvious fluctuation in symptoms wiht weather or environmental change or other alleviating  or aggravating factors  Pt with hx of severe GERD and has had multiple botox injections into distal esoph sphincter per GI MD. Still witn ongoing GERD symptoms and now on dexilant.  He is noncompliant with diet   Preventive Screening-Counseling & Management  Alcohol-Tobacco     Smoking Status: quit     Year Quit: 1999     Pack years: 93  Current Medications (verified): 1)  Dexilant 60 Mg Cpdr (Dexlansoprazole) .Marland Kitchen.. 1 Every Am 2)  Klonopin 0.5 Mg Tabs (Clonazepam) .Marland Kitchen.. 1 At Bedtime 3)  Fish Oil 1000 Mg Caps (Omega-3 Fatty Acids) .... 2 Once Daily 4)  Caltrate 600+d Plus 600-400 Mg-Unit Tabs (Calcium Carbonate-Vit D-Min) .Marland Kitchen.. 1 Two Times A Day 5)  Lutein 6 Mg Caps (Lutein) .Marland Kitchen.. 1 Once Daily 6)  Lexapro 10 Mg Tabs (Escitalopram Oxalate) .Marland Kitchen.. 1 Once Daily 7)  Multivitamins  Tabs (Multiple Vitamin) .Marland Kitchen.. 1 Once Daily 8)  Aspirin 81 Mg Tbec (Aspirin) .Marland Kitchen.. 1 Once Daily 9)  Metrogel 1 % Gel (Metronidazole) .... As Directed 10)  Veramyst 27.5 Mcg/spray Susp (Fluticasone Furoate) .... 2 Sprays Each Nostril Once Daily 11)  Pravastatin Sodium 40 Mg Tabs (Pravastatin Sodium) .Marland Kitchen.. 1 Once Daily 12)  Amoxicillin 500 Mg Caps (Amoxicillin) .... Q8h 13)  Tums E-X 750 750 Mg Chew (Calcium Carbonate Antacid) .... As Needed  Allergies (verified): 1)  ! Lipitor (Atorvastatin) 2)  ! * Ivp Dye  Past History:  Past medical, surgical, family and social histories (including risk factors) reviewed, and no changes noted (except as noted below).  Past Medical History: Hx of TIA (ICD-435.9) ESOPHAGEAL REFLUX (  ICD-530.81) MITRAL REGURGITATION (ICD-396.3) ALLERGIC RHINITIS, SEASONAL (ICD-477.0) DEPRESSION (ICD-311) HYPERTENSION, BENIGN (ICD-401.1) HYPERLIPIDEMIA (ICD-272.4) HYPERCHOLESTEROLEMIA (ICD-272.0) ANXIETY (ICD-300.00) COPD (ICD-496)  Past Surgical History: Cholecystectomy 2001 back surgery x 3 hernia surgery 1959 hernia repair 2005 left foot surgery 2009  Family History: Reviewed  history and no changes required. asthma- son heart disease - father, sister Lung Ca - sister  Social History: Reviewed history and no changes required. Patient states former smoker.  Quit in Jan 1999.  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  The patient denies coughing up blood, irregular heartbeats, indigestion, loss of appetite, weight change, tooth/dental problems, headaches, itching, ear ache, hand/feet swelling, joint stiffness or pain, rash, change in color of mucus, and fever.        See HPI for Pulmonary, Cardiac, General, and ENT review of systems.  Vital Signs:  Patient profile:   70 year old male Height:      59 inches Weight:      174 pounds BMI:     35.27 O2 Sat:      96 % on Room air Temp:     98.0 degrees F oral Pulse rate:   74 / minute BP sitting:   116 / 82  (left arm) Cuff size:   59regular  Vitals Entered By: Gweneth Dimitri RN (Dec 22, 2009 3:47 PM)  O2 Flow:  Room air CC: Pulmonary Consult for COPD. , COPD initial evaluation Comments Medications reviewed with patient Daytime contact number verified with patient. Gweneth Dimitri RN  Dec 22, 2009 3:51 PM    Physical Exam  Additional Exam:  Gen: Pleasant, well-nourished, in no distress,  normal affect ENT: No lesions,  mouth severe erythema,  oropharynx clear, no postnasal drip Neck: No JVD, no TMG, no carotid bruits Lungs: No use of accessory muscles, no dullness to percussion, clear without rales or rhonchi, distant BS Cardiovascular: RRR, heart sounds normal, no murmur or gallops, no peripheral edema Abdomen: soft and NT, no  HSM,  BS normal, abd protuberant Musculoskeletal: No deformities, no cyanosis or clubbing Neuro: alert, non focal Skin: Warm, no lesions or rashes    CXR  Procedure date:  11/04/2009  Findings:      Left base atelectasis,  chronic interstitial changes  Pulmonary Function Test Date: 12/22/2009 4:18 PM Gender: Male  Pre-Spirometry FVC    Value: 2.52 L/min   % Pred: 59.40 % FEV1    Value: 1.18 L     Pred: 3.12 L     % Pred: 38 % FEV1/FVC  Value: 46.90 %     % Pred: 63.70 %  Impression & Recommendations:  Problem # 1:  COPD (ICD-496) Assessment Deteriorated Moderate COPD Golds stage III with  GERD as a major exacerbating feature.  Not oxygen dependent plan reflux diet adherence resume spiriva d/c fish oil stay on PPI f/u with GI MD finish amoxicillin  Medications Added to Medication List This Visit: 1)  Fish Oil 1000 Mg Caps (Omega-3 fatty acids) .... 2 once daily 2)  Amoxicillin 500 Mg Caps (Amoxicillin) .... Q8h 3)  Tums E-x 750 750 Mg Chew (Calcium carbonate antacid) .... As needed 4)  Spiriva Handihaler 18 Mcg Caps (Tiotropium bromide monohydrate) .... Two puffs in handihaler daily 5)  Proair Hfa 108 (90 Base) Mcg/act Aers (Albuterol sulfate) .Marland Kitchen.. 1-2 puffs every 4-6 hours as needed  Complete Medication List: 1)  Dexilant 60 Mg Cpdr (Dexlansoprazole) .Marland Kitchen.. 1 every am 2)  Klonopin 0.5 Mg Tabs (Clonazepam) .Marland Kitchen.. 1 at bedtime 3)  Caltrate 600+d Plus 600-400 Mg-unit Tabs (Calcium carbonate-vit d-min) .Marland Kitchen.. 1 two times a day 4)  Lutein 6 Mg Caps (Lutein) .Marland Kitchen.. 1 once daily 5)  Lexapro 10 Mg Tabs (Escitalopram oxalate) .Marland Kitchen.. 1 once daily 6)  Multivitamins Tabs (Multiple vitamin) .Marland Kitchen.. 1 once daily 7)  Aspirin 81 Mg Tbec (Aspirin) .Marland Kitchen.. 1 once daily 8)  Metrogel 1 % Gel (Metronidazole) .... As directed 9)  Veramyst 27.5 Mcg/spray Susp (Fluticasone furoate) .... 2 sprays each nostril once daily 10)  Pravastatin Sodium 40 Mg Tabs (Pravastatin sodium) .Marland Kitchen.. 1 once daily 11)   Amoxicillin 500 Mg Caps (Amoxicillin) .... Q8h 12)  Tums E-x 750 750 Mg Chew (Calcium carbonate antacid) .... As needed 13)  Spiriva Handihaler 18 Mcg Caps (Tiotropium bromide monohydrate) .... Two puffs in handihaler daily 14)  Proair Hfa 108 (90 Base) Mcg/act Aers (Albuterol sulfate) .Marland Kitchen.. 1-2 puffs every 4-6 hours as needed  Other Orders: New Patient Level V (16109) Spirometry w/Graph (60454) Prescription Created Electronically [phone])  Patient Instructions: 1)  Follow Reflux diet 2)  Start Spiriva daily 3)  Stay on Dexilant daily 4)  Stop Fish oil 5)  Return two months Prescriptions: PROAIR HFA 108 (90 BASE) MCG/ACT  AERS (ALBUTEROL SULFATE) 1-2 puffs every 4-6 hours as needed  #1 x 6   Entered and Authorized by:   Storm Frisk MD   Signed by:   Storm Frisk MD on 12/22/2009   Method used:   [facility]  [id number]       [address]       [place], [place]  [address]       Ph: [phone] or [phone]       Fax: [phone]   RxID:   [id number] SPIRIVA HANDIHALER 18 MCG  CAPS (TIOTROPIUM BROMIDE MONOHYDRATE) Two puffs in handihaler daily  #30 x 6   Entered and Authorized by:   Storm Frisk MD   Signed by:   Storm Frisk MD on 12/22/2009   Method used:   [facility]        [facility]  [id number]       [address]       [place], [place]  [address]       Ph: [phone] or [phone]       Fax: [phone]   RxID:   [id number]    CardioPerfect Spirometry  ID: [id number] Patient: Chris Lewis, Chris Lewis DOB: 07-Jan-1941 Age: 70 Years Old Sex: Male Race: White Height: 59 Weight: 174 Status: Confirmed Recorded: 12/22/2009 4:18 PM  Parameter  Measured Predicted %Predicted FVC     2.52        4.24        59.40 FEV1     1.18        3.12        38 FEV1%   46.90        73.60        63.70 PEF    4.80  8.11        59.20   Comments: Severe airflow  obstruction  Interpretation: Pre: FVC= 2.52L FEV1= 1.18L FEV1%= 46.9% 1.18/2.52 FEV1/FVC (12/22/2009 4:22:18 PM), Severe obstruction.Moderately severe restriction     Appended Document: Pulmonary Consultation fax Kirby Funk, Mee Hives both @ [facility]

## 2010-09-15 NOTE — Progress Notes (Signed)
Summary: speak to nurse  Phone Note Call from Patient Call back at Home Phone [phone]   Caller: Patient Call For: wright Summary of Call: Pt c/o of blurred vision, watery eyes, some pain in this area x3 wks wants to know if it could be related to his spiriva, pls advise.//[facility] [address] Initial call taken by: Darletta Moll,  June 19, 2010 4:31 PM  Follow-up for Phone Call        pt saw Dr. Delford Field on 04-21-10 and was given avelox and prednisone for copd flare. At this time he was also c/o watery eyes. pt states all symptoms have improved except for his eyes. he states they have gotten worse and vision is now blurry and eyes are now sore. Pt wants to know could this be due to any meds that he is on. Please advise. Carron Curie CMA  June 19, 2010 4:50 PM  ? whether this reflects allergies that precipitated his COPD exacerbation. Would recommend loratadine 10mg  once daily (OTC) through the weekend, then call our office next week to report if he is better. Leslye Peer MD  June 19, 2010 5:21 PM   Follow-up by: Leslye Peer MD,  June 19, 2010 5:21 PM  Additional Follow-up for Phone Call Additional follow up Details #1::        pt advised of recs.Carron Curie CMA  June 19, 2010 5:36 PM

## 2010-09-15 NOTE — Assessment & Plan Note (Signed)
Summary: Pulmonary OV   Copy to:  Dr. Kirby Funk Primary Provider/Referring Provider:  Dr. Kirby Funk  CC:  Acute visit.  Pt c/o "tickle in throat" and cough worsening over the past several days.  He states that cough is prod with yellow sputum and also has yellow nasal d/c.  Pt states that cough seems to be worse at night especially when he lies down.Marland Kitchen  History of Present Illness: Pulmonary OV       This is a 70 year old man with COPD   Dx with Copd.   since 2002.  Seen by Dr Delford Field 2003, records not available on this OV. PFTs at [facility]  2002 ? results but showed moderate obstruction Was doing well, until resp  virus issue 2/11.  Two grandchildren ill and pt became ill.  Now not getting over the situation.  Worse since that time. Has been rx with zpak, pred, levaquin and now amoxicillin. ([facility]) Pt notes strep screen was neg.  Now on amoxicillin Not able to eat or swallow for two days,  now throat is better was rx spiriva 2 weeks and then stopped.  ? if helped Pt denies any significant sore throat, nasal congestion or excess secretions, fever, chills, sweats, unintended weight loss, pleurtic or exertional chest pain, orthopnea PND, or leg swelling Pt denies any increase in rescue therapy over baseline, denies waking up needing it or having any early am or nocturnal exacerbations of coughing/wheezing/or dyspnea. Pt also denies any obvious fluctuation in symptoms wiht weather or environmental change or other alleviating or aggravating factors  Pt with hx of severe GERD and has had multiple botox injections into distal esoph sphincter per GI MD. Still witn ongoing GERD symptoms and now on dexilant.  He is noncompliant with diet Jan 07, 2010 3:28 PM Now is coughing more.  The pt notes a tickles in throat.   There is now more yellow mucous.   The pt had botox injection last week into distal esophagus. The pt is using cough drops,  The pt notes  if coughs hard and nose will  run and is yellow out of nose,  had pain in L ear. Dyspnea is better compared to prior OV. Pt denies any significant sore throat, nasal congestion , fever, chills, sweats, unintended weight loss, pleurtic or exertional chest pain, orthopnea PND, or leg swelling    Preventive Screening-Counseling & Management  Alcohol-Tobacco     Smoking Status: quit > 6 months  Current Medications (verified): 1)  Dexilant 60 Mg Cpdr (Dexlansoprazole) .Marland Kitchen.. 1 At Bedtime 2)  Klonopin 0.5 Mg Tabs (Clonazepam) .Marland Kitchen.. 1 At Bedtime 3)  Caltrate 600+d Plus 600-400 Mg-Unit Tabs (Calcium Carbonate-Vit D-Min) .Marland Kitchen.. 1 Two Times A Day 4)  Lutein 6 Mg Caps (Lutein) .Marland Kitchen.. 1 Once Daily 5)  Lexapro 10 Mg Tabs (Escitalopram Oxalate) .Marland Kitchen.. 1 Once Daily 6)  Multivitamins  Tabs (Multiple Vitamin) .Marland Kitchen.. 1 Once Daily 7)  Aspirin 81 Mg Tbec (Aspirin) .Marland Kitchen.. 1 Once Daily 8)  Metrogel 1 % Gel (Metronidazole) .... As Directed 9)  Pravastatin Sodium 40 Mg Tabs (Pravastatin Sodium) .Marland Kitchen.. 1 Once Daily 10)  Tums E-X 750 750 Mg Chew (Calcium Carbonate Antacid) .... As Needed 11)  Spiriva Handihaler 18 Mcg  Caps (Tiotropium Bromide Monohydrate) .... Two Puffs in Handihaler Daily 12)  Proair Hfa 108 (90 Base) Mcg/act  Aers (Albuterol Sulfate) .Marland Kitchen.. 1-2 Puffs Every 4-6 Hours As Needed  Allergies (verified): 1)  ! Lipitor (Atorvastatin) 2)  ! *  Ivp Dye  Past History:  Past medical, surgical, family and social histories (including risk factors) reviewed, and no changes noted (except as noted below).  Past Medical History: Reviewed history from 12/22/2009 and no changes required. 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 Cholecystectomy 2001 back surgery x 3 hernia surgery 1959 hernia repair 2005 left foot surgery 2009  Family History: Reviewed history from 12/22/2009 and no changes required. asthma- son heart disease - father, sister Lung Ca - sister  Social History: Reviewed history from 12/22/2009 and no changes required. Patient states former smoker.  Quit in Jan 1999.  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  The patient denies shortness of breath with activity, shortness of breath at rest, coughing up blood, chest pain, irregular heartbeats, acid heartburn, indigestion, loss of appetite, weight change, abdominal pain, difficulty swallowing, sore throat, tooth/dental problems, headaches, sneezing, itching, ear ache, anxiety, depression, hand/feet swelling, joint stiffness or pain, rash, change in color of mucus, and fever.    Vital Signs:  Patient profile:   70 year old male Weight:      174 pounds O2 Sat:      95 % on Room air Temp:     97.7 degrees F oral Pulse rate:   70 / minute BP sitting:   110 / 60  (left arm)  Vitals Entered By: Vernie Murders (Jan 07, 2010 3:13 PM)  O2 Flow:  Room air  Physical Exam  Additional Exam:  Gen: Pleasant, well-nourished, in no distress,  normal affect ENT: No lesions,  mouth severe erythema,  oropharynx clear, no postnasal drip Neck: No JVD, no TMG, no carotid bruits Lungs: No use of accessory muscles, no dullness to percussion, clear without rales or rhonchi, distant BS Cardiovascular: RRR, heart sounds normal, no murmur or gallops, no peripheral edema Abdomen: soft and NT, no HSM,  BS normal, abd protuberant Musculoskeletal: No deformities, no cyanosis or clubbing Neuro: alert, non focal Skin: Warm, no lesions or rashes    Impression  & Recommendations:  Problem # 1:  ACUTE BRONCHITIS (ICD-466.0) Assessment Deteriorated Acute tracheobronchitis with associated moderate COPD exacerbation, cyclical cough, allergic rhinitis also drivers of cough plan Flonase/nasonex two sprays each nostril daily Astepro two sprays each nostril daily Cyclic cough protocol using tussicaps/tessalon perles A depomedrol 120mg  injection will be given Augmentin one twice daily for 7days No other medication changes Return 2 weeks for recheck The following medications were removed from the medication list:    Amoxicillin 500 Mg Caps (Amoxicillin) ..... Q8h His updated medication list for this problem includes:    Spiriva Handihaler 18 Mcg Caps (Tiotropium bromide monohydrate) .Marland Kitchen..Marland Kitchen Two puffs in handihaler daily    Proair Hfa 108 (90 Base) Mcg/act Aers (Albuterol sulfate) .Marland Kitchen... 1-2 puffs every 4-6 hours as needed    Tussicaps 10-8 Mg Xr12h-cap (Hydrocod polst-chlorphen polst) ..... One by mouth two times a day as needed cough    Benzonatate 100 Mg Caps (Benzonatate) .Marland Kitchen... Take one - two by mouth every 4-6 hours as needed cough    Amoxicillin-pot Clavulanate 875-125 Mg Tabs (Amoxicillin-pot clavulanate) ..... One by mouth  two times a day  Orders: Est. Patient Level V (62130) Depo- Medrol 40mg  (J1030) Admin of Therapeutic Inj  intramuscular or subcutaneous (86578)  Medications Added to Medication List This Visit: 1)  Dexilant 60 Mg Cpdr (Dexlansoprazole) .Marland Kitchen.. 1 at bedtime 2)  Tussicaps 10-8 Mg Xr12h-cap (Hydrocod polst-chlorphen polst) .... One by mouth two times a day as needed cough 3)  Benzonatate 100 Mg Caps (Benzonatate) .... Take one - two by mouth every 4-6 hours as needed cough 4)  Fluticasone Propionate 50 Mcg/act Susp (Fluticasone propionate) .... Two sprays each nostril daily 5)  Astepro 0.15 % Soln (Azelastine hcl) .... Two sprays each nostril daily 6)  Amoxicillin-pot Clavulanate 875-125 Mg Tabs (Amoxicillin-pot clavulanate) .... One  by mouth two times a day  Complete Medication List: 1)  Dexilant 60 Mg Cpdr (Dexlansoprazole) .Marland Kitchen.. 1 at bedtime 2)  Klonopin 0.5 Mg Tabs (Clonazepam) .Marland Kitchen.. 1 at bedtime 3)  Caltrate 600+d Plus 600-400 Mg-unit Tabs (Calcium carbonate-vit d-min) .Marland Kitchen.. 1 two times a day 4)  Lutein 6 Mg Caps (Lutein) .Marland Kitchen.. 1 once daily 5)  Lexapro 10 Mg Tabs (Escitalopram oxalate) .Marland Kitchen.. 1 once daily 6)  Multivitamins Tabs (Multiple vitamin) .Marland Kitchen.. 1 once daily 7)  Aspirin 81 Mg Tbec (Aspirin) .Marland Kitchen.. 1 once daily 8)  Metrogel 1 % Gel (Metronidazole) .... As directed 9)  Pravastatin Sodium 40 Mg Tabs (Pravastatin sodium) .Marland Kitchen.. 1 once daily 10)  Tums E-x 750 750 Mg Chew (Calcium carbonate antacid) .... As needed 11)  Spiriva Handihaler 18 Mcg Caps (Tiotropium bromide monohydrate) .... Two puffs in handihaler daily 12)  Proair Hfa 108 (90 Base) Mcg/act Aers (Albuterol sulfate) .Marland Kitchen.. 1-2 puffs every 4-6 hours as needed 13)  Tussicaps 10-8 Mg Xr12h-cap (Hydrocod polst-chlorphen polst) .... One by mouth two times a day as needed cough 14)  Benzonatate 100 Mg Caps (Benzonatate) .... Take one - two by mouth every 4-6 hours as needed cough 15)  Fluticasone Propionate 50 Mcg/act Susp (Fluticasone propionate) .... Two sprays each nostril daily 16)  Astepro 0.15 % Soln (Azelastine hcl) .... Two sprays each nostril daily 17)  Amoxicillin-pot Clavulanate 875-125 Mg Tabs (Amoxicillin-pot clavulanate) .... One by mouth two times a day  Patient Instructions: 1)  Flonase/nasonex two sprays each nostril daily 2)  Astepro two sprays each nostril daily 3)  Cyclic cough protocol 4)  A depomedrol 120mg  injection will be given 5)  Augmentin one twice daily for 7days 6)  No other medication changes 7)  Return 2 weeks for recheck Prescriptions: AMOXICILLIN-POT CLAVULANATE 875-125 MG TABS (AMOXICILLIN-POT CLAVULANATE) One by mouth two times a day  #14 x 0   Entered and Authorized by:   Storm Frisk MD   Signed by:   Storm Frisk MD on  01/07/2010   Method used:   [facility]        [facility]  [id number]       [address]       [place], [place]  [address]       Ph: [phone] or [phone]       Fax: [phone]   RxID:   [id number] ASTEPRO 0.15 % SOLN (AZELASTINE HCL) Two sprays each nostril daily  #1 x 6   Entered and Authorized by:   Storm Frisk MD   Signed by:   Storm Frisk MD on 01/07/2010   Method used:   [facility]        [facility]  [address] [id number]       [address]       [place], [place]  [address]       Ph: [phone] or [phone]       Fax: [phone]   RxID:   [id number] FLUTICASONE PROPIONATE 50 MCG/ACT SUSP (FLUTICASONE PROPIONATE) Two sprays each nostril daily  #1 x 4   Entered and Authorized by:   Storm Frisk MD   Signed by:   Storm Frisk MD on 01/07/2010   Method used:   [facility]        [facility]  [id number]       [address]       [place], [place]  [address]       Ph: [phone] or [phone]       Fax: [phone]   RxID:   [id number] BENZONATATE 100 MG CAPS (BENZONATATE) Take one - two by mouth every 4-6 hours as needed cough  #90 x 4   Entered and Authorized by:   Storm Frisk MD   Signed by:   Storm Frisk MD on 01/07/2010   Method used:   [facility]        [facility]  [id number]       [address]       [place], [place]  [address]       Ph: [phone] or [phone]       Fax: [phone]   RxID:   [id number] TUSSICAPS 10-8 MG XR12H-CAP (HYDROCOD POLST-CHLORPHEN POLST) One by mouth two times a day as needed cough  #14 x 0   Entered and Authorized by:   Storm Frisk MD   Signed by:   Storm Frisk MD on 01/07/2010   Method used:   Print then Give to Patient   RxID:   [id number]      Medication Administration  Injection # 1:    Medication: Depo-  Medrol 40mg     Diagnosis: ACUTE BRONCHITIS (ICD-466.0)    Route: IM    Site: RUOQ gluteus    Exp Date: 06/16/2012    Lot #: [id number]    Mfr: [employer]    Patient tolerated injection without complications    Given by: Vernie Murders (Jan 07, 2010 4:10 PM)  Injection # 2:    Medication: Depo- Medrol 80mg     Diagnosis: ACUTE BRONCHITIS (ICD-466.0)    Route: IM    Site: RUOQ gluteus    Exp Date: 06/16/2012    Lot #: [id number]    Mfr: [employer]    Patient tolerated injection without complications    Given by: Vernie Murders (Jan 07, 2010 4:10 PM)  Orders Added: 1)  Est. Patient Level V [61443] 2)  Depo- Medrol 40mg  [J1030] 3)  Admin of Therapeutic Inj  intramuscular or subcutaneous [96372]   Appended Document: Pulmonary OV fax Kirby Funk

## 2010-09-23 NOTE — Assessment & Plan Note (Signed)
Summary: Pulmonary OV   Copy to:  Dr. Kirby Funk Primary Provider/Referring Provider:  Dr. Kirby Funk  CC:  2 month follow up.  Pt states breathing is doing "fine" but does c/o chest congestion and prod cough with yellowish green mucus off and on x 2 wks.  .  History of Present Illness: Pulmonary OV       This is a 70 year old man with COPD    July 15, 2010 2:28 PM Was getting better, then awoke 4am with diarrhea and hurt all over.   Wife is ill as well. Also other family members with same illness. Was ok prior to this. Called in on 11/23 and rx pred and avelox and this helped.  Notes dry eyes. Just had botox injection of esophagus on 07/02/10 Dysphagia is better but heartburn is worse   September 15, 2010 1:50 PM Is better vs 11/11.   First few weeks of Jan was ok until then with the wind and cool air and got dyspneic and awoke congestion.   Yellow and green mucus is now productive. Pt remains dyspneic. Using botox injections on esophagus.  Not as effective as before, last rx 12/11.   Preventive Screening-Counseling & Management  Alcohol-Tobacco     Smoking Status: quit > 6 months     Year Quit: 1999     Pack years: 69  Current Medications (verified): 1)  Dexilant 60 Mg Cpdr (Dexlansoprazole) .Marland Kitchen.. 1 At Bedtime 2)  Klonopin 0.5 Mg Tabs (Clonazepam) .Marland Kitchen.. 1 At Bedtime 3)  Caltrate 600+d Plus 600-400 Mg-Unit Tabs (Calcium Carbonate-Vit D-Min) .Marland Kitchen.. 1 Two Times A Day 4)  Lutein 6 Mg Caps (Lutein) .Marland Kitchen.. 1 Once Daily 5)  Multivitamins  Tabs (Multiple Vitamin) .Marland Kitchen.. 1 Once Daily 6)  Aspirin 81 Mg Tbec (Aspirin) .Marland Kitchen.. 1 Once Daily 7)  Metrogel 1 % Gel (Metronidazole) .... As Directed 8)  Simvastatin 40 Mg Tabs (Simvastatin) .... Take 1 Tablet By Mouth Once A Day 9)  Tums E-X 750 750 Mg Chew (Calcium Carbonate Antacid) .... As Needed 10)  Spiriva Handihaler 18 Mcg  Caps (Tiotropium Bromide Monohydrate) .... Two Puffs in Handihaler Daily 11)  Ventolin Hfa 108 (90 Base) Mcg/act   Aers (Albuterol Sulfate) .Marland Kitchen.. 1-2 Puffs Every 4-6 Hours As Needed 12)  Benzonatate 100 Mg Caps (Benzonatate) .... Take One - Two By Mouth Every 4-6 Hours As Needed Cough 13)  Fluticasone Propionate 50 Mcg/act Susp (Fluticasone Propionate) .... Two Sprays Each Nostril Daily  Allergies (verified): 1)  ! Lipitor (Atorvastatin) 2)  ! * Ivp Dye 3)  * Crestor  Past History:  Past medical, surgical, family and social histories (including risk factors) reviewed, and no changes noted (except as noted below).  Past Medical History: Reviewed history from 12/22/2009 and no changes required. 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 Cholecystectomy 2001 back surgery x 3 hernia surgery 1959 hernia repair 2005 left foot surgery 2009  Family History: Reviewed history from 12/22/2009 and no changes required. asthma- son heart disease - father, sister Lung Ca - sister  Social History: Reviewed history from 12/22/2009 and no changes required. Patient states former smoker.  Quit in Jan 1999.  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  The patient denies shortness of breath at rest, non-productive cough, coughing up blood, chest pain, irregular heartbeats, loss of appetite, weight change, abdominal pain, difficulty swallowing, sore throat, tooth/dental problems, headaches, nasal congestion/difficulty breathing through nose, sneezing, itching, ear ache, anxiety, depression, hand/feet swelling, joint  stiffness or pain, rash, and fever.    Vital Signs:  Patient profile:   70 year old male Height:      69 inches Weight:      171.38 pounds BMI:     25.40 O2 Sat:      96 % on Room air Temp:     97.9 degrees F oral Pulse rate:   69 / minute BP sitting:   118 / 76  (right arm) Cuff size:   regular  Vitals Entered By: Gweneth Dimitri RN (September 15, 2010 1:42 PM)  O2 Flow:  Room air CC: 2 month follow up.  Pt states breathing is doing "fine" but does c/o chest congestion and prod cough with yellowish green mucus off and on x 2 wks.   Comments Medications reviewed with patient Daytime contact number verified with patient. Gweneth Dimitri RN  September 15, 2010 1:42 PM    Physical Exam  Additional Exam:  Gen: Pleasant, well-nourished, in no distress,  normal affect ENT: No lesions,  mouth severe erythema,  oropharynx clear, no postnasal drip Neck: No JVD, no TMG, no carotid bruits Lungs: No use of accessory muscles, no dullness to percussion, distant BS Cardiovascular: RRR, heart sounds normal, no murmur or gallops, no peripheral edema Abdomen: soft and NT, no HSM,  BS normal, abd protuberant Musculoskeletal: No deformities, no cyanosis or clubbing Neuro: alert, non focal Skin: Warm, no lesions or rashes    Impression & Recommendations:  Problem # 1:  COPD (ICD-496) Assessment Deteriorated acute tracheobronchits with GERD flare  plan Doxycycline one twice daily for 7days Use ranitidine at bedtime once a day for 30days and stop (sent to pharmacy) No other med changes Follow strict reflux diet Return 2 months   Medications Added to Medication List This Visit: 1)  Ranitidine Hcl 300 Mg Tabs (Ranitidine hcl) .... Use one by mouth daily 2)  Doxycycline Monohydrate 100 Mg Caps (Doxycycline monohydrate) .... By mouth twice daily  Complete Medication List: 1)  Dexilant 60 Mg Cpdr (Dexlansoprazole) .Marland Kitchen.. 1 at bedtime 2)  Klonopin 0.5 Mg Tabs (Clonazepam) .Marland Kitchen.. 1 at bedtime 3)   Caltrate 600+d Plus 600-400 Mg-unit Tabs (Calcium carbonate-vit d-min) .Marland Kitchen.. 1 two times a day 4)  Lutein 6 Mg Caps (Lutein) .Marland Kitchen.. 1 once daily 5)  Multivitamins Tabs (Multiple vitamin) .Marland Kitchen.. 1 once daily 6)  Aspirin 81 Mg Tbec (Aspirin) .Marland Kitchen.. 1 once daily 7)  Metrogel 1 % Gel (Metronidazole) .... As directed 8)  Simvastatin 40 Mg Tabs (Simvastatin) .... Take 1 tablet by mouth once a day 9)  Tums E-x 750 750 Mg Chew (Calcium carbonate antacid) .... As needed 10)  Spiriva Handihaler 18 Mcg Caps (Tiotropium bromide monohydrate) .... Two puffs in handihaler daily 11)  Ventolin Hfa 108 (90 Base) Mcg/act Aers (Albuterol sulfate) .Marland Kitchen.. 1-2 puffs every 4-6 hours as needed 12)  Benzonatate 100 Mg Caps (Benzonatate) .... Take one - two by mouth every 4-6 hours as needed cough 13)  Fluticasone Propionate 50 Mcg/act Susp (Fluticasone propionate) .... Two sprays each nostril daily 14)  Ranitidine Hcl 300 Mg Tabs (Ranitidine hcl) .... Use one by mouth  daily 15)  Doxycycline Monohydrate 100 Mg Caps (Doxycycline monohydrate) .... By mouth twice daily  Other Orders: Est. Patient Level IV (16109)  Patient Instructions: 1)  Doxycycline one twice daily for 7days 2)  Use ranitidine at bedtime once a day for 30days and stop (sent to pharmacy) 3)  No other med changes 4)  Follow strict reflux diet 5)  Return 2 months  Prescriptions: DOXYCYCLINE MONOHYDRATE 100 MG  CAPS (DOXYCYCLINE MONOHYDRATE) By mouth twice daily  #14 x 0   Entered and Authorized by:   Storm Frisk MD   Signed by:   Storm Frisk MD on 09/15/2010   Method used:   Electronically to        [facility]  [address] [id number] (retail)       [address]       [place], [place]  [address]       Ph: [phone] or [phone]       Fax: [phone]   RxID:   [id number] RANITIDINE HCL 300 MG TABS (RANITIDINE HCL) Use one by mouth daily  #30 x 0   Entered and Authorized by:   Storm Frisk MD   Signed by:   Storm Frisk MD on 09/15/2010   Method used:   Electronically to        [facility]  [address] [id number] (retail)       [address]       [place], [place]  [address]       Ph: [phone] or [phone]       Fax: [phone]   RxID:   [id number]   Appended Document: Pulmonary OV fax Kirby Funk

## 2010-12-03 ENCOUNTER — Ambulatory Visit (HOSPITAL_COMMUNITY)
Admission: RE | Admit: 2010-12-03 | Discharge: 2010-12-03 | Disposition: A | Discharge status: Home / Self Care | Payer: BC Managed Care – PPO | Source: Ambulatory Visit | Attending: Gastroenterology | Admitting: Gastroenterology

## 2010-12-03 DIAGNOSIS — K220 Achalasia of cardia: Secondary | ICD-10-CM | POA: Insufficient documentation

## 2010-12-09 NOTE — Op Note (Signed)
  NAME:  Chris Lewis, Chris Lewis NO.:  [id number]  MEDICAL RECORD NO.:  [id number]           PATIENT TYPE:  O  LOCATION:  [facility]                         FACILITY:  [facility]  PHYSICIAN:  Danise Edge, M.D.   DATE OF BIRTH:  24-Jan-1941  DATE OF PROCEDURE:  12/03/2010 DATE OF DISCHARGE:                              OPERATIVE REPORT   PROCEDURE:  Esophagogastroduodenoscopy with Botox injection into the lower esophageal sphincter.  HISTORY:  Chris Lewis.  Chris Lewis is a 70 year old male born on 10-16-1940.  The patient has achalasia managed with Botox injections.  The patient has received Botox injections into the lower esophageal sphincter on August 02, 2007, January 24, 2008, September 11, 2008, March 05, 2009, July 29, 2009, February 01, 2010 and July 02, 2010.  ENDOSCOPIST:  Danise Edge, M.D.  PREMEDICATIONS: 1. Fentanyl 100 mcg. 2. Versed 5 mg.  DESCRIPTION OF PROCEDURE:  After obtaining informed consent, the patient was placed in the left lateral decubitus position.  The Pentax gastroscope was passed through the posterior hypopharynx into the proximal esophagus without difficulty.  The hypopharynx, larynx and vocal cords appeared normal.  Esophagoscopy:  The proximal, mid and lower segments of the esophageal mucosa appeared normal.  The squamocolumnar junction was noted at approximately 44 cm from the incisor teeth.  There was a small amount of residual solid food and saliva filling the distal esophagus.  Gastroscopy:  Retroflexed view of the gastric cardia and fundus was normal.  The gastric body, antrum and pylorus appeared completely normal.  Duodenoscopy:  The duodenal bulb and descending duodenum appeared normal.  Botox injection into the lower esophageal sphincter.  A total of 100 units of Botox were injected into the lower esophageal sphincter, 25 units in 4-quadrant injections were performed.  ASSESSMENT: 1. Achalasia associated with normal  esophagogastroduodenoscopy. 2. A 100 units of Botox injected into the lower esophageal sphincter     muscle.          ______________________________ Danise Edge, M.D.     MJ/MEDQ  D:  12/03/2010  T:  12/03/2010  Job:  [id number]  Electronically Signed by Danise Edge M.D. on 12/09/2010 12:07:15 PM

## 2010-12-29 NOTE — Op Note (Signed)
NAME:  HALEY, ROZA NO.:  [id number]   MEDICAL RECORD NO.:  [id number]          PATIENT TYPE:  AMB   LOCATION:  ENDO                         FACILITY:  [facility]   PHYSICIAN:  Danise Edge, M.D.   DATE OF BIRTH:  Jun 17, 1941   DATE OF PROCEDURE:  03/05/2009  DATE OF DISCHARGE:  03/05/2009                               OPERATIVE REPORT   HISTORY:  Mr. Kalik Hoare. Bomar is a 70 year old male, born May 04, 1941.  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  He has elected to receive Botox  injections approximately every 6 months.  He has declined achalasia  surgery.  He received his first Botox injection on August 02, 2007.  He received his second Botox injection on January 24, 2008.  He received  his third Botox injection on September 11, 2008.   ALLERGIES:  IODINATED CONTRAST DYE and LIPITOR.   CURRENT MEDICATIONS:  2% MetroGel, Combivent, pravastatin, clonazepam,  aspirin, multivitamin, B complex vitamin, lutein tablet, calcium,  vitamin D, fish oil, Advair Diskus.   PAST MEDICAL AND SURGICAL HISTORY:  1. Achalasia.  2. Hypercholesterolemia.  3. Chronic obstructive pulmonary disease.  4. Chronic anxiety.  5. Seasonal allergic rhinitis.  6. Transient ischemic attack in 2005.  7. Mitral valve regurgitation.  8. Osteopenia.  9. Cholecystectomy.  10.Right inguinal herniorrhaphy.  11.Three back surgeries.  12.Foot surgery.   HABITS:  The patient smokes cigarettes for 36 years, but quit in 1999.  He does not consume alcohol.   ENDOSCOPIST:  Danise Edge, MD   PREMEDICATIONS:  1. Fentanyl 75 mcg.  2. Versed 10 mg.   PROCEDURE:  After obtaining informed consent, the patient was placed in  the left lateral decubitus position.  I administered intravenous  fentanyl and intravenous Versed to achieve conscious sedation for the  procedure.  The patient's blood pressure, oxygen saturation, and cardiac  rhythm were monitored throughout the procedure and documented in  the  medical record.   The Pentax gastroscope was passed through the posterior hypopharynx into  the proximal esophagus without difficulty.  The hypopharynx, larynx, and  vocal cords appeared normal.   ESOPHAGOSCOPY:  The proximal, mid, and lower segments of the esophageal  mucosa appeared normal.  There was no endoscopic evidence for the  presence of erosive esophagitis or Barrett esophagus.  The  squamocolumnar junction is noted at approximately 45 cm from the incisor  teeth.   GASTROSCOPY:  Retroflex view of the gastric cardia and fundus was  normal.  The gastric body, antrum, and pylorus appeared normal.   DUODENOSCOPY:  There is an isolated erosion in the duodenal bulb with a  normal-appearing descending duodenum.   PROCEDURE:  Botox injection at the esophagogastric junction.  A total of  100 units of Botox was injected at the esophagogastric junction.  25  units of Botox was injected in four quadrants without complications.   ASSESSMENT:  1. Chronic achalasia.  2. 100 units of Botox injected at the esophagogastric junction.           ______________________________  Danise Edge, M.D.  MJ/MEDQ  D:  03/05/2009  T:  03/06/2009  Job:  [id number]   cc:   Thora Lance, M.D.

## 2010-12-29 NOTE — Op Note (Signed)
NAME:  JAHSIR, RAMA NO.:  [id number]   MEDICAL RECORD NO.:  [id number]          PATIENT TYPE:  AMB   LOCATION:  ENDO                         FACILITY:  [facility]   PHYSICIAN:  Danise Edge, M.D.   DATE OF BIRTH:  April 18, 1941   DATE OF PROCEDURE:  09/11/2008  DATE OF DISCHARGE:                               OPERATIVE REPORT   PROCEDURE INDICATIONS:  Mr. Calder Oblinger. Paget is a 70 year old male born on  01/05/1941.  Mr. Labell has chronic achalasia.  To manage his  dysphagia, he has elected to receive Botox injections approximately  every 6 months.  We have discussed achalasia surgery.  His last Botox  injection at the esophagogastric junction was approximately 7 months  ago.  He has redeveloped dysphagia.   CHRONIC MEDICATIONS:  1. Clonazepam 0.5 mg each evening.  2. Doxycycline hyclate 20 mg twice a day.  3. Lipitor 20 mg daily.  4. Nexium 40 mg each morning.  5. MetroGel 2% daily.  6. Combivent inhaler p.r.n.   MEDICATION ALLERGIES:  IODINATED CONTRAST DYE.   PAST MEDICAL HISTORY:  Achalasia, hypercholesterolemia, hypertension,  chronic obstructive pulmonary disease, anxiety, seasonal allergic  rhinitis, remote transient ischemic attack, mitral valve regurgitation,  osteopenia, and normal screening colonoscopy on December 09, 2004   FAMILY HISTORY:  Noncontributory.   HABITS:  Mr. Pardon does not smoke cigarettes or consume alcohol.   ENDOSCOPIST:  Danise Edge, MD   PREMEDICATION:  Fentanyl 100 mcg and Versed 10 mg.   PROCEDURE:  After obtaining informed consent, Mr. Mccauslin was placed in  the left lateral decubitus position.  I administered intravenous  fentanyl and intravenous Versed to achieve conscious sedation for the  procedure.  The patient's blood pressure, oxygen saturation, and cardiac  rhythm were monitored throughout the procedure and documented in the  medical record.   The Pentax gastroscope was passed through the posterior hypopharynx  into  the proximal esophagus without difficulty.  The hypopharynx, larynx, and  vocal cords appeared normal.   Esophagoscopy:  The proximal mid and lower segments of the esophageal  mucosa appeared normal.  There was no endoscopic evidence for the  presence of erosive esophagitis or Barrett esophagus.  It took some  pressure to pop through the closed lower esophageal sphincter.   Gastroscopy:  Retroflexed view of the gastric cardia and fundus was  normal.  The gastric body appeared normal.  There were a few scattered  circular erosions in the gastric antrum.  The pylorus appeared normal.   Duodenoscopy:  The duodenal bulb and descending duodenum appeared  normal.   Procedure:  Botox injection at the esophagogastric junction.  A total of  100 units of Botox was injected at the esophagogastric junction noted at  approximately 40 cm from the incisor teeth.  25 units of Botox was  injected in 4 quadrants without complications.   ASSESSMENT:  Chronic achalasia.  100 units of Botox was injected at the  esophagogastric junction.           ______________________________  Danise Edge, M.D.     MJ/MEDQ  D:  09/11/2008  T:  09/11/2008  Job:  [id number]   cc:   Thora Lance, M.D.

## 2010-12-29 NOTE — Op Note (Signed)
NAME:  Chris Lewis, JUSTO NO.:  [id number]   MEDICAL RECORD NO.:  [id number]          PATIENT TYPE:  AMB   LOCATION:  ENDO                         FACILITY:  [facility]   PHYSICIAN:  Danise Edge, M.D.   DATE OF BIRTH:  Jul 14, 1941   DATE OF PROCEDURE:  01/24/2008  DATE OF DISCHARGE:                               OPERATIVE REPORT   An esophagogastroduodenoscopy with Botox injection at the  esophagogastric junction to treat achalasia.   REFERRING PHYSICIAN:  Thora Lance, MD.   PROCEDURE INDICATION:  Mr. Ruby Dilone. Yeagley is a 70 year old male born  1941-06-27.  Mr. Deguia has achalasia.  On August 02, 2007, he  underwent his first esophagogastroduodenoscopy with Botox injection at  the esophagogastric junction with excellent results.  He is still  considering Heller myotomy surgery, but would like to have a repeat  Botox injection.   ENDOSCOPIST:  Danise Edge, MD.   PREMEDICATION:  1. Fentanyl 50 mcg.  2. Versed 5 mg.   PROCEDURE:  After obtaining informed consent, Mr. Banks was placed in  the left lateral decubitus position.  I administered intravenous  fentanyl and intravenous Versed to achieve conscious sedation for the  procedure.  The patient's blood pressure, oxygen saturation, and cardiac  rhythm were monitored throughout the procedure and documented in the  medical record.   The Pentax gastroscope was passed through the posterior hypopharynx into  the proximal esophagus without difficulty.  The hypopharynx, larynx, and  vocal cords appeared normal.   Esophagoscopy:  The proximal mid and lower segments of the esophageal  mucosa appeared normal.  The esophagogastric junction and squamocolumnar  junction are noted at approximately 43 cm from the incisor teeth.  The  esophageal mucosa appears completely normal.  There is no esophageal  stricture formation.  There is slight resistance of the passage of the  gastroscope through the lower  esophageal sphincter.   Gastroscopy:  Retroflex view of the gastric cardia and fundus was  normal.  The gastric body, antrum, and pylorus appeared normal.   Duodenoscopy:  The duodenal bulb and descending duodenum appeared  normal.   Botox injection.  Four 25 units of Botox injections were delivered in a  circumferential in a four-quadrant fashion at the esophagogastric  junction; 100 units of Botox was administered.   ASSESSMENT:  1. Achalasia.  2. Normal esophagogastroduodenoscopy.  3. Botox 100 units delivered to the lower esophageal sphincter.           ______________________________  Danise Edge, M.D.     MJ/MEDQ  D:  01/24/2008  T:  01/24/2008  Job:  [id number]

## 2010-12-29 NOTE — Op Note (Signed)
NAME:  Chris Lewis, Chris Lewis NO.:  [id number]   MEDICAL RECORD NO.:  [id number]          PATIENT TYPE:  AMB   LOCATION:  ENDO                         FACILITY:  [facility]   PHYSICIAN:  Danise Edge, M.D.   DATE OF BIRTH:  08-Apr-1941   DATE OF PROCEDURE:  08/02/2007  DATE OF DISCHARGE:                               OPERATIVE REPORT   PROCEDURE:  Esophagogastroduodenoscopy with Botox injection at the  esophagogastric junction.   HISTORY:  Mr. Garey Alleva.  Weekes is a 71 year old male born May 04, 1941.  Mr. Altergott has esophageal dysphagia, probably secondary to  achalasia.   On Dec 16, 1999 Mr. Marshburn's esophagogastroduodenoscopy was normal.   On April 16, 2004 his esophagogastroduodenoscopy was normal.  A 16-mm  Savary dilator was passed without mucosal stricture dilation.   On July 19, 2006 his barium swallow revealed diminished primary  peristaltic waves in the esophagus associated with moderate tertiary  contractions in the mid - distal esophagus associated with a sliding  hiatal hernia and possible short segment stricture at the  esophagogastric junction.   On August 03, 2006 his esophagogastroduodenoscopy was normal.  A 16-mm  Savary dilator passed without resistance and without mucosal stricture  dilation.   On October 26, 2006 he underwent an esophageal manometry which revealed a  low resting lower esophageal sphincter pressure with complete  relaxation; frequent swallow associated non peristaltic esophageal body  contractions; low esophageal body peristaltic contractions (30 mmHg).   Mr. Meacham recently underwent an attempted nuclear medicine gastric  emptying study.  The consumed nuclear meal never exited the esophagus  and a gastric emptying study could not be performed.   CHRONIC MEDICATIONS:  Lexapro, clonazepam, Nexium, aspirin, lutein,  MetroGel, folic acid, sublingual hyoscyamine with meals, Lipitor,  multivitamin, Citracal.   ENDOSCOPIST:   Danise Edge, M.D.   PREMEDICATION:  Fentanyl 50 mcg, Versed 5 mg.   PROCEDURE:  After obtaining informed consent, Mr. Toso was placed in  the left lateral decubitus position.  I administered intravenous  fentanyl and intravenous Versed to achieve conscious sedation for the  procedure.  The patient's blood pressure, oxygen saturation and cardiac  rhythm were monitored throughout the procedure and documented in the  medical record.   The Pentax gastroscope was passed through the posterior hypopharynx into  the proximal esophagus without difficulty.  The hypopharynx, larynx and  vocal cords appeared normal.   Esophagoscopy:  The proximal, mid and lower segments of the esophageal  mucosa appeared normal.  There is resistance to the passage of the  gastroscope at the lower esophageal sphincter area into the stomach.  There is residual saliva pooling in the mid - distal esophagus.  The  squamocolumnar junction is noted at 43 cm from the incisor teeth.  There  is no endoscopic evidence for the presence of erosive esophagitis or  Barrett's esophagus.  I do not detect mucosal stricture formation.   Gastroscopy:  Retroflex view of the gastric cardia and fundus was  normal.  The gastric body, antrum and pylorus appeared normal.   Duodenoscopy:  There are multiple  small erosions in the duodenal bulb  extending into the proximal second portion of the duodenum.  The distal  second portion of the duodenum appeared normal.  These were probably due  to his aspirin.   Botox injection.  Four 25 unit Botox injections were performed at the  esophagogastric junction without difficulty.   ASSESSMENT:  1. Probable achalasia.  2. Erosions in the duodenal bulb, probably due to aspirin.  3. 100 units Botox injected at the esophagogastric junction (four      injections at 25 units each).           ______________________________  Danise Edge, M.D.     MJ/MEDQ  D:  08/02/2007  T:  08/02/2007   Job:  [id number]

## 2011-01-01 NOTE — Discharge Summary (Signed)
NAME:  Chris Lewis, Chris Lewis                           ACCOUNT NO.:  [id number]   MEDICAL RECORD NO.:  [id number]                   PATIENT TYPE:  INP   LOCATION:  3019                                 FACILITY:  [facility]   PHYSICIAN:  Casimiro Needle L. Thad Ranger, M.D.           DATE OF BIRTH:  26-Sep-1940   DATE OF ADMISSION:  10/23/2002  DATE OF DISCHARGE:  10/24/2002                                 DISCHARGE SUMMARY   ADMISSION DIAGNOSES:  1. Transient right homonomous hemianopsia most likely transient ischemia     attack.  2. History of gastroesophageal reflux disease.  3. History of lumbosacral radiculopathy with back surgery.   DISCHARGE DIAGNOSES:  1. Transient right homonomous hemianopsia most likely transient ischemia     attack.  2. History of gastroesophageal reflux disease.  3. History of lumbosacral radiculopathy with back surgery.   CONDITION ON DISCHARGE:  Improved.   DIET:  Gastroesophageal.   ACTIVITY:  Ad lib.   DISCHARGE MEDICATIONS:  1. Plavix 75 mg p.o. daily (new).  2. Aspirin 325 mg daily.  3. Protonix 40 mg daily.  4. Crestor 10 mg p.o. daily (new).  5. Folic acid 1 mg p.o. daily (new).  6. Combivent inhaler as needed.   LABORATORY DATA:  1. CT of the head October 23, 2002 negative.  2. Chest x-ray October 23, 2002 emphysema without acute process.  3. MRI of the brain October 24, 2002, essentially normal with no evidence of     acute infarct.  4. MRA intracranial dominant right vertebral artery, mild ectasia of the     cerebral vascular system without high grade stenosis.  5. EKG October 23, 2002 marked sinus bradycardia with a rate of 46 beats per     minute.  6. Carotid Doppler October 24, 2002, no ICA stenosis.  7. Laboratory review.  CBC and CMET normal. Homocystine elevated at 15.54.     Lipid panel:  Total cholesterol 192, HDL low at 30, LDL elevated at 133.   HOSPITAL COURSE:  Please see admission H&P for full details. Briefly, this  is a 70 year old man who  presented with a transient right homonomous  hemianopsia who was admitted for a TIA workup. Plavix was added to his  aspirin. He underwent routine stroke workup with the aforementioned studies.  His risk factors were felt to be primarily hyperlipidemia and  hyperhomocystinemia and he was started on Crestor and folic acid  respectively for these. He had no recurrence of symptoms during  hospitalization. He was felt stable for discharge and was discharged to home  on October 24, 2002. He was to follow up with his primary physician and his  other physicians as scheduled and follow-up with neurology as needed.  Michael L. Thad Ranger, M.D.    MLR/MEDQ  D:  02/10/2003  T:  02/11/2003  Job:  [id number]   cc:   Ike Bene, M.D.  [address]  [place]  [place] [address]  Fax: [phone]   Armanda Magic, M.D.  [address]  [place], [place] [address]  Fax: [phone]   Danise Edge, M.D.  [address]  [place]  [place] [address]  Fax: [phone]    cc:   Ike Bene, M.D.  [address]  [place]  [place] [address]  Fax: [phone]   Armanda Magic, M.D.  [address]  [place], [place] [address]  Fax: [phone]   Danise Edge, M.D.  [address]  [place]  [place] [address]  Fax: [phone]

## 2011-01-01 NOTE — Op Note (Signed)
NAME:  CAULIN, BEGLEY NO.:  [id number]   MEDICAL RECORD NO.:  [id number]          PATIENT TYPE:  AMB   LOCATION:  ENDO                         FACILITY:  [facility]   PHYSICIAN:  Danise Edge, M.D.   DATE OF BIRTH:  1941-06-29   DATE OF PROCEDURE:  08/03/2006  DATE OF DISCHARGE:                               OPERATIVE REPORT   PROCEDURE:  Esophagogastroduodenoscopy and Savary esophageal dilation.   INDICATIONS FOR PROCEDURE:  Mr. Tiegan Terpstra is a 70 year old male born  08-06-1941.  Mr. Banner has chronic esophageal dysphagia,  probably due to an esophageal motility disorder (diffuse esophageal  spasm versus achalasia).  He takes Nexium to control heartburn.   On Dec 16, 1999, his esophagogastroduodenoscopy was normal except for  the presence of a small hiatal hernia.   On April 16, 2004, his esophagogastroduodenoscopy was normal.  A 16  mm Savary dilator was passed without mucosal stricture dilation.   On April 19, 2006, Mr. Sagraves underwent a barium swallow x-ray study  including the barium tablet.  A sliding hiatal hernia with short segment  stricture was noted at the gastroesophageal junction.  There was a  diminished primary peristalsis with moderate tertiary contractions in  the mid and distal esophagus.  The barium tablet lodged at the  gastroesophageal junction.   CHRONIC MEDICATIONS:  Nexium and aspirin.   ENDOSCOPIST:  Danise Edge, M.D.   PREMEDICATION:  Fentanyl 60 mcg and Versed 7.5 mg.   PROCEDURE:  After obtaining informed consent, Mr. Kocsis was placed in  the left lateral decubitus position.  I administered intravenous  fentanyl and intravenous Versed to achieve conscious sedation for the  procedure.  The patient's blood pressure, oxygen saturation and cardiac  rhythm were monitored throughout the procedure and documented in the  medical record.   The Pentax gastroscope was passed through the posterior hypopharynx into  the  proximal esophagus without difficulty.  The hypopharynx, larynx and  vocal cords appeared normal.   ESOPHAGOSCOPY:  The proximal mid and lower segments of the esophageal  mucosa appear completely normal.  The squamocolumnar junction and  esophagogastric junction are noted at approximately 42 cm from the  incisor teeth.  There is no endoscopic evidence for the presence of  erosive esophagitis or Barrett's esophagus.  Pressure was applied in  order to pop through the lower esophageal sphincter into the stomach.  There are no esophageal mucosal strictures present.  The esophagus is  slightly dilated and tortuous.   GASTROSCOPY:  Retroflexed view of the gastric cardia and fundus was  normal.  The gastric body, antrum and pylorus appeared normal.   DUODENOSCOPY:  The duodenal bulb and descending duodenum appeared  normal.   SAVARY ESOPHAGEAL DILATION:  The Savary dilator wire was passed through  the gastroscope and the tip of the guidewire advanced to the distal  gastric antrum.  The 16 mm Savary dilator was passed without resistance.  Radiology was not utilized.  Repeat esophagogastrostomy revealed a  completely intact and normal esophageal mucosa and no gastric trauma due  to the guidewire.  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  I think Mr. Martos is developing achalasia.  He does  have diminished primary peristalsis by barium swallow.  There is  resistance at the lower esophageal sphincter to passage of the  gastroscope unassociated with an esophageal stricture.   I will discuss esophageal manometry with Mr. Formica in the office.           ______________________________  Danise Edge, M.D.     MJ/MEDQ  D:  08/03/2006  T:  08/03/2006  Job:  [id number]   cc:   Thora Lance, M.D.

## 2011-01-01 NOTE — Op Note (Signed)
[facility]  Patient:    Chris Lewis, Chris Lewis                        MRN: [id number] Proc. Date: 02/18/00 Adm. Date:  [id number] Attending:  Brandy Hale CC:         Yvette Rack. Dorna Bloom, M.D.                           Operative Report  PREOPERATIVE DIAGNOSIS:  Chronic cholecystitis with cholelithiasis.  POSTOPERATIVE DIAGNOSIS:  Chronic cholecystitis with cholelithiasis.  OPERATION PERFORMED:  Laparoscopic cholecystectomy.  SURGEON:  Angelia Mould. Derrell Lolling, M.D.  FIRST ASSISTANT:  Rose Phi. Maple Hudson, M.D.  OPERATIVE INDICATIONS:  This is a 70 year old white male who gives a four-year history of intermittent episodes of postprandial right upper quadrant pain and upper mid-right back pain.  He gets bloated after certain types of meals. These pain episodes last about one hour and then resolve.  This has become progressive and is now almost a daily problem.  He has had extensive workup, including an extensive cardiac workup, which has been urevealing.  He is noted to have moderately severe COPD.  He has gastroesophageal reflux disease and has undergone esophageal dilatation in the past but is now completely asymptomatic.  CT scan of the chest and abdomen shows bullous emphysema of the lungs and a calcified gallstone.  It is felt that his symptoms are almost certainly due to biliary colic.  He is brought to the operating room electively.  OPERATIVE FINDINGS:  The gallbladder was chronically inflamed.  It was discolored, slightly thick-walled, and contained extensive adhesions of the omentum and duodenum to it, suggesting prior inflammatory episodes.  The liver otherwise looked normal.  The stomach and duodenum, small intestine and large intestine were otherwise normal.  There were no other intra-abdominal adhesions.  OPERATIVE TECHNIQUE:  Following the induction of general endotracheal anesthesia, the patients abdomen was prepped and draped in a sterile  fashion. Marcaine 0.5% with epinephrine was used as a local infiltration anesthetic. A vertical incision was made inside the lower rim of the umbilicus.  The fascia was incised in the midline and the abdominal cavity entered under direct vision.  A 10 mm Hasson trocar was inserted and secured with a pursestring suture of 0 Vicryl.  Pneumoperitoneum was created.  A video camera was inserted with visualization and findings as described above.  A 10 mm trocar was placed in the subxiphoid region and two 5 mm trocars placed in the right midabdomen.  The gallbladder was placed on traction.  Adhesions were taken down.  We carefully dissected out the infundibulum of the gallbladder, the cystic duct, and cystic artery.  We clearly identified the junction of the cystic duct with the gallbladder and the region of the common bile duct.  We created a large window behind the cystic duct and the cystic artery to clarify the anatomy. The cystic duct and cystic artery were then secured with multiple metal clips and divided.  The gallbladder was dissected from its bed uneventfully with electrocautery and was removed through the umbilical port.  The operative field was copiously irrigated both in the subhepatic and subphrenic spaces. At the completion of the case, there was no bleeding and no bile leak whatsoever.  The trocars were removed under direct vision, and there was no bleeding from the trocar sites.  The pneumoperitoneum was released.  The fascia of the umbilicus  was closed with 0 Vicryl suture.  The skin incisions were closed with subcuticular sutures of 4-0 Vicryl and Steri-Strips.  Clean bandages were placed and the patient taken to the recovery room in stable condition.  Estimated blood loss was about 10 cc.  Complications:  None. Sponge, needle, and instrument counts were correct.DD:  02/18/00 TD:  02/18/00 Job: [id number]

## 2011-01-01 NOTE — H&P (Signed)
NAME:  Chris Lewis, Chris Lewis                           ACCOUNT NO.:  [id number]   MEDICAL RECORD NO.:  [id number]                   PATIENT TYPE:  EMS   LOCATION:  MAJO                                 FACILITY:  [facility]   PHYSICIAN:  Michael L. Thad Ranger, M.D.           DATE OF BIRTH:  04-10-1941   DATE OF ADMISSION:  10/23/2002  DATE OF DISCHARGE:                                HISTORY & PHYSICAL   CHIEF COMPLAINT:  Visual changes and confusion.   HISTORY OF PRESENT ILLNESS:  This is the initial stroke service admission  for this 70 year old man with a past medical history which includes  gastroesophageal reflux disease.  He was in his usual state of fairly good  health until last evening when he says while looking at catalogue, he  noticed that he had trouble reading the right half of words; along with  this, he had a fairly severe headache in the frontal areas behind the eyes  which had a little bit of a throbbing quality.  There was no associated  speech abnormality and he denied experiencing any weakness or numbness of  the extremities.  This lasted about 15 minutes and as far as he knows,  completely resolved, although he did not try to do any more reading that  evening.  The next morning he awakened and felt pretty good, although he  still had a mild residual headache.  He had another episode of having  difficulty reading, specifically being able to see the right half of words  around noontime, which again resolved after 15 minutes.  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  He noted this difficulty with speech and  comprehension and went to the local fire department for evaluation and they  advised him to come here to be further evaluation.  He says  this has now  resolved and he feels fine right now.  He denies having any previous history  of similar events to this.  He did have some chest pain last evening, but he  says this was typical of his gastroesophageal reflux disease and was  eventually relieved with Tums.  He denies any shortness of breath or  palpitations.   PAST MEDICAL HISTORY:  Past medical history as above.  He has had some  endoscopies for his stomach problems.  He also has mitral valve prolapse and  bradycardia.  He had a recent negative Cardiolite done by Dr. Armanda Magic.  He also has a history of back surgery x3 done by Dr. Alanson Aly. 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   FAMILY HISTORY:  Father had a stroke and is now on Coumadin.  He also has a  sister with a pacemaker.   SOCIAL HISTORY:  He quit smoking in 1999.  He is normally independent in his  activities of daily living.   ALLERGIES:  No known drug allergies.   MEDICATIONS:  Aspirin, Protonix and Tums.   REVIEW OF SYSTEMS:  CONSTITUTIONAL:  No fever or chills.  No weight changes.  HEAD:  Headache as above.  He has no history of headache.  EYES:  Visual  changes as above.  ENT:  No recent URI symptoms.  CV:  Some chest pain as  above, not definitely cardiac.  No palpitations.  RESPIRATORY:  Occasional  shortness of breath and coughing.  GI:  He does have some vague abdominal  pain but no nausea or vomiting.  GU:  Some nocturia.  MUSCULOSKELETAL:  Negative.  NEUROLOGIC:  As above.  NEUROLOGIC:  He has had no definite  slurring of speech, weakness, focal weakness or numbness.   PHYSICAL EXAMINATION:  VITAL SIGNS:  Temperature 97.1, blood pressure  110/75, pulse 55, respirations 18.  GENERAL:  This is a healthy-appearing man lying on the gurney in no evident  distress.  HEAD:  Cranium is normocephalic and atraumatic.  Oropharynx is benign.  NECK:  Neck supple without carotid bruits.  HEART:   Heart bradycardic, sometimes markedly so, but was regular in rhythm  without murmur.  CHEST:  Chest clear to auscultation bilaterally.  ABDOMEN:  Abdomen is soft and nontender with normoactive bowel sounds.  EXTREMITIES:  No edema.  Pulses 2+.  NEUROLOGIC:  Mental status:  He is awake, alert and completely oriented to  time, place and person.  Recent and remote memory are intact.  Attention  span, concentration and fund of knowledge are appropriate.  Speech is fluent  and not dysarthric.  He is able to repeat a phrase and can name objects and  read both simple words and sentences.  Cranial nerves:  Funduscopic exam is  benign.  Pupils are equal and briskly reactive.  Extraocular movements are  normal without nystagmus.  Visual fields are full to confrontation.  Hearing  is intact and symmetric to finger rub.  Facial sensation is intact to  pinprick.  Face, tongue and palate all move normally and symmetrically.  Shoulder shrug strength is normal.  Motor testing:  Normal bulk and tone.  There is weakness of the left ankle in both dorsiflexors and plantarflexors  as well as extensors of the toes, otherwise, normal strength throughout.  Sensation is intact to pinprick and light touch throughout.  Reflexes are 2+  and symmetric.  Toes are downgoing.  Cerebellar:  Rapid alternating  movements are normal.  Finger-to-nose and heel-to-shin are performed well.  Gait is normal.  He favors the right leg a little bit and has a bit of  trouble tandem walking due to some weakness of the left foot.   LABORATORY REVIEW:  CBC and CMET are normal.  Cardiac enzymes are negative.  Coagulations are normal.   CT of the head is personally reviewed and to my eye is normal.   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  These symptoms are highly concerning for left brain transient  ischemic  attacks involving the posterotemporal and/or occipital areas.   PLAN:  1. Admit for observation.  2. Will  add Plavix to aspirin.  3. MRI and MRA of the head.  4. Carotid Doppler.  5. He had a recent echocardiogram at Dr. Norris Cross office on December 2003     and we will acquire the records for that.  He is not likely to require     another echo.  6. We will check fasting homocysteine and lipid panel.  7. Disposition pending above.                                               Michael L. Thad Ranger, M.D.    MLR/MEDQ  D:  10/23/2002  T:  10/24/2002  Job:  [id number]   cc:   Ike Bene, M.D.  [address]  [place]  [place] [address]  Fax: [phone]   Armanda Magic, M.D.  [address]  [place], [place] [address]  Fax: [phone]   Danise Edge, M.D.  [address]  [place]  [place] [address]  Fax: [phone]

## 2011-01-01 NOTE — Op Note (Signed)
NAME:  Chris Lewis, Chris Lewis NO.:  [id number]   MEDICAL RECORD NO.:  [id number]          PATIENT TYPE:  AMB   LOCATION:  ENDO                         FACILITY:  [facility]   PHYSICIAN:  Danise Edge, M.D.   DATE OF BIRTH:  Dec 20, 1940   DATE OF PROCEDURE:  DATE OF DISCHARGE:  10/26/2006                               OPERATIVE REPORT   PROCEDURE INDICATION:  The patient is a 70 year old male born [birth year].  The patient has chronic solid food and liquid esophageal  dysphagia.   On Dec 16, 1999, his esophagogastroduodenoscopy was normal.   On April 16, 2004, his esophagogastroduodenoscopy was normal.  A 16-  mm Savary dilator was passed without mucosal stricture dilation.   On July 19, 2006, his barium swallow x-ray revealed diminished  primary peristaltic waves with moderate tertiary contractions in the mid  - distal esophagus associated with a sliding hiatal hernia and possible  short segment stricture at the esophagogastric junction.   On August 03, 2006 his esophagogastroduodenoscopy was normal.  The 16-  mm Savary dilator passed without resistance and without esophageal  mucosal stricture dilation.   The patient is scheduled for esophageal manometry to look for signs of  achalasia.   LOWER ESOPHAGEAL SPHINCTER DATA:  The patient's resting lower esophageal  sphincter pressure is low at 11.2 mmHg.  His lower esophageal sphincter  relaxes completely with each swallow.  With relaxation the residual  pressure at the lower esophageal sphincter is 0.1 mmHg.   ESOPHAGEAL BODY DATA:  The patient performed 9 wet swallows.  Thirty-  three (33%) percent of the wet swallows generated a peristaltic  contraction with a low amplitude (30 mmHg).  Fifty-six (56%) percent of  the wet swallows generated simultaneous contractions in the esophagus.  Eleven (11%) percent of the wet swallows resulted in a retrograde  contractions of the esophagus.   IMPRESSION:  1.  Low resting lower esophageal sphincter pressure with complete      relaxation.  2. Frequent swallow-associated non peristaltic esophageal body      contractions.  3. Low esophageal body peristaltic contractions (30 mmHg).   PLAN:  The patient is being started on Prevacid SoluTab twice daily plus  pre-meal sublingual hyoscyamine.           ______________________________  Danise Edge, M.D.    MJ/MEDQ  D:  11/11/2006  T:  11/11/2006  Job:  [id number]

## 2011-01-01 NOTE — Op Note (Signed)
NAME:  Chris Lewis, Chris Lewis NO.:  [id number]   MEDICAL RECORD NO.:  [id number]          PATIENT TYPE:  AMB   LOCATION:  ENDO                         FACILITY:  [facility]   PHYSICIAN:  Danise Edge, M.D.   DATE OF BIRTH:  Oct 24, 1940   DATE OF PROCEDURE:  12/09/2004  DATE OF DISCHARGE:                                 OPERATIVE REPORT   PROCEDURE PERFORMED:  Screening colonoscopy.   INDICATIONS:  Chris Lewis is a 70 year old male born May 04, 1941. Chris Lewis is scheduled to undergo his first screening colonoscopy with  polypectomy to prevent colon cancer.   ENDOSCOPIST:  Danise Edge, M.D.   PREMEDICATION:  Versed 7.5 milligrams, Demerol 70 milligrams.   PROCEDURE:  After obtaining informed consent, Chris Lewis was placed in the  left lateral decubitus position. I administered intravenous Demerol and  intravenous Versed to achieve conscious sedation for the procedure. The  patient's blood pressure, oxygen saturation and cardiac rhythm were  monitored throughout the procedure and documented in the medical record.   Anal inspection was normal. Digital rectal exam revealed a nonnodular  prostate. The Olympus adjustable pediatric colonoscope was introduced into  the rectum and advanced to the cecum. A normal-appearing ileocecal valve was  intubated and the distal ileum inspected. Colonic preparation for the exam  today was excellent.   Rectum normal.  Retroflex view of the distal rectum normal.  Sigmoid colon and descending colon.  A few small diverticula are present.  Splenic flexure normal.  Transverse colon normal.  Hepatic flexure normal.  Descending colon normal.  Cecum and ileocecal valve normal.  Distal ileum normal.   ASSESSMENT:  Normal proctocolonoscopy to the cecum with inspection of the  distal ileum.      MJ/MEDQ  D:  12/09/2004  T:  12/09/2004  Job:  [id number]

## 2011-01-01 NOTE — Op Note (Signed)
NAME:  Chris Lewis, Chris Lewis NO.:  [id number]   MEDICAL RECORD NO.:  [id number]          PATIENT TYPE:  AMB   LOCATION:  ENDO                         FACILITY:  [facility]   PHYSICIAN:  Danise Edge, M.D.   DATE OF BIRTH:  06-04-1941   DATE OF PROCEDURE:  05/06/2004  DATE OF DISCHARGE:                                 OPERATIVE REPORT   PROCEDURE:  Esophagogastroduodenoscopy with Savary esophageal dilation.   PROCEDURE INDICATION:  Chris Lewis is a 70 year old male, born  September 14, 1940.  Chris Lewis has intermittent dysphagia to both liquids  and solids.  When he develops dysphagia, he has no difficulty breathing.  He  is able to excuse himself from the table and regurgitate the obstructing  solid or liquid bolus.  He is on Protonix for heartburn prevention.  He has  a history of mitral valve prolapse and takes amoxicillin prior to dental  procedures.   Dec 16, 1999, esophagogastroduodenoscopy was performed to evaluate dysphagia  and revealed a small hiatal hernia; otherwise, the  esophagogastroduodenoscopy was normal.   ENDOSCOPIST:  Charolett Bumpers, M.D.   PREMEDICATION:  1.  Versed 7.5 mg.  2.  Demerol 50 mg.   DESCRIPTION OF PROCEDURE:  After obtaining informed consent, Chris Lewis was  placed in the left lateral decubitus position.  I administered intravenous  Demerol and intravenous Versed to achieve conscious sedation for the  procedure.  The patient's blood pressure, oxygen saturation, and cardiac  rhythm were monitored throughout the procedure and documented in the medical  record.   The Olympus gastroscope was passed through the posterior hypopharynx into  the proximal esophagus without difficulty.  The hypopharynx, larynx, and  vocal cords appeared normal.   ESOPHAGOSCOPY:  The proximal, mid, and lower segments of the esophageal  mucosa appear completely normal.  There is no endoscopic evidence for the  presence of erosive esophagitis,  Barrett's esophagus, esophageal mucosal  scarring, or esophageal obstruction.   GASTROSCOPY:  Retroflexed view of the gastric cardia and fundus was normal.  The diaphragmatic hiatus was somewhat patulous.  The gastric body, antrum,  and pylorus appear normal.   DUODENOSCOPY:  The duodenal bulb and descending duodenum appear normal.   SAVARY ESOPHAGEAL DILATION:  The Savary dilator wire was passed through the  endoscope and the tip of the guidewire advanced to the distal gastric  antrum, as confirmed both endoscopically and fluoroscopically.  Under  fluoroscopic guidance, the 16 mm Savary dilator was passed without  resistance.  Repeat esophagogastroscopy following esophageal dilation  revealed intact esophageal mucosa, indicating no esophageal mucosa stricture  formation and no gastric trauma due to the guidewire.   ASSESSMENT:  Normal esophagogastroduodenoscopy.  A 16 mm Savary dilator  passed without disrupting the normal-appearing esophageal mucosa.   RECOMMENDATIONS:  I suspect Chris Lewis has an esophageal motility disorder  such as diffuse esophageal spasm.  I had no difficulty in traversing the  esophagogastric junction, and I doubt he has full-blown achalasia.   To improve esophageal motility, he could try p.r.n. sublingual nitroglycerin  or p.r.n. sublingual hyoscyamine.  MJ/MEDQ  D:  05/06/2004  T:  05/06/2004  Job:  [id number]   cc:   Ike Bene, M.D.  [address]  [place]  [place] [id number]  Fax: [phone]

## 2011-01-12 ENCOUNTER — Other Ambulatory Visit: Payer: Self-pay | Admitting: Critical Care Medicine

## 2011-05-13 ENCOUNTER — Ambulatory Visit (HOSPITAL_COMMUNITY)
Admission: RE | Admit: 2011-05-13 | Discharge: 2011-05-13 | Disposition: A | Discharge status: Home / Self Care | Payer: BC Managed Care – PPO | Source: Ambulatory Visit | Attending: Gastroenterology | Admitting: Gastroenterology

## 2011-05-13 DIAGNOSIS — J4489 Other specified chronic obstructive pulmonary disease: Secondary | ICD-10-CM | POA: Insufficient documentation

## 2011-05-13 DIAGNOSIS — J449 Chronic obstructive pulmonary disease, unspecified: Secondary | ICD-10-CM | POA: Insufficient documentation

## 2011-05-13 DIAGNOSIS — E7800 Pure hypercholesterolemia, unspecified: Secondary | ICD-10-CM | POA: Insufficient documentation

## 2011-05-13 DIAGNOSIS — K220 Achalasia of cardia: Secondary | ICD-10-CM | POA: Insufficient documentation

## 2011-05-13 DIAGNOSIS — F411 Generalized anxiety disorder: Secondary | ICD-10-CM | POA: Insufficient documentation

## 2011-05-13 DIAGNOSIS — Z8673 Personal history of transient ischemic attack (TIA), and cerebral infarction without residual deficits: Secondary | ICD-10-CM | POA: Insufficient documentation

## 2011-05-13 DIAGNOSIS — I059 Rheumatic mitral valve disease, unspecified: Secondary | ICD-10-CM | POA: Insufficient documentation

## 2011-05-13 DIAGNOSIS — K2960 Other gastritis without bleeding: Secondary | ICD-10-CM | POA: Insufficient documentation

## 2011-05-13 DIAGNOSIS — R1310 Dysphagia, unspecified: Secondary | ICD-10-CM | POA: Insufficient documentation

## 2011-05-16 NOTE — Op Note (Signed)
  NAME:  STEFFEN, HASE NO.:  [id number]  MEDICAL RECORD NO.:  [id number]  LOCATION:  WLEN                         FACILITY:  [facility]  PHYSICIAN:  Danise Edge, M.D.   DATE OF BIRTH:  [birth year]  DATE OF PROCEDURE:  05/13/2011 DATE OF DISCHARGE:                              OPERATIVE REPORT   REFERRING PHYSICIANS: 1. Thora Lance, M.D. 2. Armanda Magic, M.D.  PROCEDURE:  Esophagogastroduodenoscopy with Botox injection into the lower esophageal sphincter muscle.  HISTORY:  Chris Lewis is a 70 year old male, born on Apr 19, 1941.  The patient has chronic achalasia and has received Botox injections since December 2008.  ENDOSCOPIST:  Danise Edge, M.D.  PREMEDICATION: 1. Fentanyl 100 mcg. 2. Versed 7.5 mg.  PROCEDURE IN DETAIL:  The patient was placed in the left lateral decubitus position.  The Pentax gastroscope was passed through the posterior hypopharynx into the proximal esophagus without difficulty. The hypopharynx, larynx, and vocal cords appeared normal.  Esophagoscopy:  The proximal mid and lower segments of the esophageal mucosa appeared normal.  The squamocolumnar junction was noted at approximately 40 cm from the incisor teeth.  A 100 units Botox was injected into the lower esophageal sphincter at the esophagogastric junction.  Four quadrant injections with 25 units of Botox were utilized.  Gastroscopy:  Retroflex view of the gastric, cardia, and fundus was normal.  The gastric body appeared normal.  There were a few scattered circular erosions in the gastric antrum.  The pylorus appeared normal.  Duodenoscopy:  The duodenal bulb and __________ duodenum appeared normal.  ASSESSMENT: 1. Chronic achalasia.  A 100 units of Botox injected into the lower     esophageal sphincter muscle. 2. Scattered erosions in the gastric antrum.          ______________________________ Danise Edge, M.D.     MJ/MEDQ  D:  05/13/2011   T:  05/13/2011  Job:  [id number]  Electronically Signed by Danise Edge M.D. on 05/16/2011 01:44:36 PM

## 2011-05-18 ENCOUNTER — Ambulatory Visit (HOSPITAL_COMMUNITY)
Admission: RE | Admit: 2011-05-18 | Discharge: 2011-05-18 | Disposition: A | Discharge status: Home / Self Care | Payer: BC Managed Care – PPO | Source: Ambulatory Visit | Attending: Cardiology | Admitting: Cardiology

## 2011-05-18 ENCOUNTER — Encounter: Payer: Self-pay | Admitting: Thoracic Surgery (Cardiothoracic Vascular Surgery)

## 2011-05-18 ENCOUNTER — Inpatient Hospital Stay (HOSPITAL_BASED_OUTPATIENT_CLINIC_OR_DEPARTMENT_OTHER)
Admission: RE | Admit: 2011-05-18 | Discharge: 2011-05-18 | Disposition: A | Discharge status: Home / Self Care | Payer: BC Managed Care – PPO | Source: Ambulatory Visit | Attending: Cardiology | Admitting: Cardiology

## 2011-05-18 DIAGNOSIS — E7800 Pure hypercholesterolemia, unspecified: Secondary | ICD-10-CM

## 2011-05-18 DIAGNOSIS — Z8719 Personal history of other diseases of the digestive system: Secondary | ICD-10-CM

## 2011-05-18 DIAGNOSIS — Z9889 Other specified postprocedural states: Secondary | ICD-10-CM | POA: Insufficient documentation

## 2011-05-18 DIAGNOSIS — J4489 Other specified chronic obstructive pulmonary disease: Secondary | ICD-10-CM | POA: Insufficient documentation

## 2011-05-18 DIAGNOSIS — J449 Chronic obstructive pulmonary disease, unspecified: Secondary | ICD-10-CM | POA: Insufficient documentation

## 2011-05-18 DIAGNOSIS — G459 Transient cerebral ischemic attack, unspecified: Secondary | ICD-10-CM | POA: Insufficient documentation

## 2011-05-18 DIAGNOSIS — I059 Rheumatic mitral valve disease, unspecified: Secondary | ICD-10-CM | POA: Insufficient documentation

## 2011-05-18 DIAGNOSIS — I340 Nonrheumatic mitral (valve) insufficiency: Secondary | ICD-10-CM

## 2011-05-18 DIAGNOSIS — J302 Other seasonal allergic rhinitis: Secondary | ICD-10-CM | POA: Insufficient documentation

## 2011-05-18 DIAGNOSIS — Z8379 Family history of other diseases of the digestive system: Secondary | ICD-10-CM | POA: Insufficient documentation

## 2011-05-18 LAB — POCT I-STAT 3, ART BLOOD GAS (G3+)
Acid-Base Excess: 1 mmol/L (ref 0.0–2.0)
Bicarbonate: 25.9 mEq/L — ABNORMAL HIGH (ref 20.0–24.0)
O2 Saturation: 90 %
pO2, Arterial: 59 mmHg — ABNORMAL LOW (ref 80.0–100.0)

## 2011-05-18 LAB — POCT I-STAT 3, VENOUS BLOOD GAS (G3P V)
Bicarbonate: 22.3 mEq/L (ref 20.0–24.0)
O2 Saturation: 69 %
TCO2: 23 mmol/L (ref 0–100)
pH, Ven: 7.347 — ABNORMAL HIGH (ref 7.250–7.300)

## 2011-05-19 ENCOUNTER — Other Ambulatory Visit: Payer: Self-pay | Admitting: Cardiology

## 2011-05-19 DIAGNOSIS — K220 Achalasia of cardia: Secondary | ICD-10-CM

## 2011-05-21 ENCOUNTER — Ambulatory Visit
Admission: RE | Admit: 2011-05-21 | Discharge: 2011-05-21 | Disposition: A | Discharge status: Home / Self Care | Payer: BC Managed Care – PPO | Source: Ambulatory Visit | Attending: Cardiology | Admitting: Cardiology

## 2011-05-21 DIAGNOSIS — K220 Achalasia of cardia: Secondary | ICD-10-CM

## 2011-05-24 ENCOUNTER — Encounter: Payer: BC Managed Care – PPO | Admitting: Thoracic Surgery (Cardiothoracic Vascular Surgery)

## 2011-05-25 ENCOUNTER — Encounter: Payer: Self-pay | Admitting: Thoracic Surgery (Cardiothoracic Vascular Surgery)

## 2011-05-25 ENCOUNTER — Institutional Professional Consult (permissible substitution) (INDEPENDENT_AMBULATORY_CARE_PROVIDER_SITE_OTHER): Payer: BC Managed Care – PPO | Admitting: Thoracic Surgery (Cardiothoracic Vascular Surgery)

## 2011-05-25 ENCOUNTER — Other Ambulatory Visit: Payer: Self-pay | Admitting: Thoracic Surgery (Cardiothoracic Vascular Surgery)

## 2011-05-25 VITALS — BP 129/80 | HR 90 | Resp 20 | Ht 69.0 in | Wt 165.0 lb

## 2011-05-25 DIAGNOSIS — I059 Rheumatic mitral valve disease, unspecified: Secondary | ICD-10-CM

## 2011-05-25 DIAGNOSIS — I340 Nonrheumatic mitral (valve) insufficiency: Secondary | ICD-10-CM

## 2011-05-25 NOTE — Progress Notes (Signed)
PCP is Lillia Mountain, MD, MD Referring Provider is Quintella Reichert, MD  Chief Complaint  Patient presents with  . Mitral Regurgitation    HPI:  Patient is a 70 year old gentleman from [place] with long-standing history of mitral valve prolapse and mitral regurgitation. Most recently the patient has been followed by Dr. Armanda Magic with serial echocardiograms. Recent echocardiogram performed as an outpatient confirmed the presence of mitral valve prolapse with at least moderate mitral regurgitation. The patient underwent elective left and right heart catheterization on 05/18/2011. He was found to have normal coronary artery anatomy with no significant coronary artery disease. There was some difficulty passing the Swan-Ganz catheter at the time of the procedure, and right heart pressures have not been reported. Elective cardiothoracic surgical consultation has been requested to consider mitral valve repair.   Past Medical History  Diagnosis Date  . Achalasia   . Hypercholesterolemia   . Chronic obstructive pulmonary disease   . Anxiety   . Seasonal allergic rhinitis   . Transient ischemic attack   . Mitral valve regurgitation   . Osteopenia   . FH: cholecystectomy   . S/P right inguinal herniorrhaphy   . COPD (chronic obstructive pulmonary disease)     Past Surgical History  Procedure Date  . Back surgery     x 3  . Foot surgery   . Esophagogastroduodenoscopy and savary esophageal dilation.     x9  . Eyebrow surgery 2012  . Rt inguinal herniorrhaphy 1969/1997  . Cholecystectomy   . Esophagoscopy w/ botox injection     x9 beginning 2008, most recent Sept. 2012    Family History  Problem Relation Age of Onset  . Stroke Father 94    cva  . Scleroderma Mother 64  . Lung cancer Sister   . Heart disease Sister     Social History History  Substance Use Topics  . Smoking status: Former Smoker    Quit date: 08/16/1997  . Smokeless tobacco: Never Used  .  Alcohol Use: No    Current Outpatient Prescriptions  Medication Sig Dispense Refill  . albuterol-ipratropium (COMBIVENT) 18-103 MCG/ACT inhaler Inhale 2 puffs into the lungs every 6 (six) hours as needed.        Marland Kitchen aspirin 81 MG tablet Take 81 mg by mouth daily.        . beta carotene w/minerals (OCUVITE) tablet Take 1 tablet by mouth daily.        . calcium carbonate 200 MG capsule Take 250 mg by mouth 2 (two) times daily with a meal.        . clonazePAM (KLONOPIN) 0.5 MG tablet Take 0.5 mg by mouth 2 (two) times daily as needed.        Marland Kitchen dextromethorphan-guaiFENesin (MUCINEX DM) 30-600 MG per 12 hr tablet Take 1 tablet by mouth every 12 (twelve) hours.        Marland Kitchen doxycycline (VIBRA-TABS) 100 MG tablet Take 100 mg by mouth daily.       . metroNIDAZOLE (METROGEL) 0.75 % gel Apply topically 2 (two) times daily.        . multivitamin-lutein (OCUVITE-LUTEIN) CAPS Take 1 capsule by mouth daily.        . predniSONE (DELTASONE) 20 MG tablet 5 mg. Tapering down 2 1/2 tablets po every day      . tiotropium (SPIRIVA) 18 MCG inhalation capsule Place 18 mcg into inhaler and inhale daily.        . VENTOLIN HFA 108 (90 BASE)  MCG/ACT inhaler USE 1-2 PUFFS EVERY 4 TO 6 HOURS AS NEEDED  1 Inhaler  6    Allergies  Allergen Reactions  . Atorvastatin     REACTION: muscle pain  . Contrast Media (Iodinated Diagnostic Agents)   . Rosuvastatin     REACTION: muscle weakness    Review of Systems  Constitutional: Positive for unexpected weight change.       The patient reports losing a few pounds in weight recently because he waited so long to have his achalasia treated.  HENT: Negative.   Eyes: Negative.   Respiratory: Positive for cough and shortness of breath.   Cardiovascular: Negative for chest pain, palpitations and leg swelling.       The patient reports mild exertional shortness of breath which occurs only with relatively strenuous physical activity.  However, the patient also reports occasional  episodes of PND.  He denies any chest pain, resting SOB, orthopnea, palpitations, syncope, or lower extremity edema.   Gastrointestinal:       The patient has longstanding symptoms of difficulty swallowing some types of solid foods which has partially improved following recent EGD with Botox injection.  Genitourinary: Positive for frequency.  Musculoskeletal: Positive for myalgias.  Neurological: Negative.   Hematological: Negative.   Psychiatric/Behavioral: Negative.     BP 129/80  Pulse 90  Resp 20  Ht 5\' 9"  (1.753 m)  Wt 165 lb (74.844 kg)  BMI 24.37 kg/m2  SpO2 95% Physical Exam  Vitals reviewed. Constitutional: He is oriented to person, place, and time. He appears well-developed and well-nourished.  HENT:  Head: Normocephalic.  Eyes: Pupils are equal, round, and reactive to light.  Neck: Normal range of motion. Neck supple. No JVD present.  Cardiovascular: Normal rate and regular rhythm.   Murmur heard.      Prominent grade IV/VI holosystolic murmur heard best at the apex with radiation all across the precordium and to the back.  Pulmonary/Chest: Effort normal and breath sounds normal. He has no wheezes. He has no rales.  Abdominal: Soft. Bowel sounds are normal.  Musculoskeletal: Normal range of motion. He exhibits no edema.  Lymphadenopathy:    He has no cervical adenopathy.  Neurological: He is alert and oriented to person, place, and time.       Grossly non-focal  Skin: Skin is warm and dry.  Psychiatric: He has a normal mood and affect. His behavior is normal. Judgment and thought content normal.     Diagnostic Tests:  Transthoracic echocardiogram performed at [facility] cardiology on 04/29/2011 is reviewed and compared with transthoracic echocardiogram performed at [place] on 05/18/2011. Both reveal the presence of at least moderate mitral regurgitation with the jet of regurgitation that is eccentric and directed anteriorly around the left atrium. There appears  to be likely prolapse of the posterior leaflet, although the leaflet is not well visualized. Left ventricular size and function are normal. No other significant abnormalities are noted. There is mild left atrial enlargement. There is trace to mild tricuspid regurgitation.  Left heart catheterization performed 05/18/2011 is reviewed. This reveals normal coronary artery anatomy with no significant coronary artery disease. Right heart pressure data are not available. Left ventricular function appears normal. There is mitral regurgitation appreciated, although probably not 4+.  Upper GI barium swallow performed October 5 is reviewed. The esophagus is only mildly dilated. There is classical bird beak appearance of the GE junction consistent with long-standing achalasia. There does not appear to be any sort of epiphrenic diverticulum or  significant other distal esophageal pathology. Contrast does pass into the stomach although delayed.   Impression:  Mitral valve prolapse with probably at least moderate mitral regurgitation. The patient has very mild symptoms of exertional shortness of breath that he states only occur with fairly strenuous physical activity. However, he does report occasional episodes of paroxysmal nocturnal dyspnea that is rapidly improved by sitting up or getting out of bed. Left ventricular size and function are normal. The patient does not have coronary artery disease. Transesophageal echocardiogram would be helpful to more definitively establish the severity of mitral regurgitation and whether or not the patient has a flail portion of his posterior mitral leaflet. If this is true and he therefore in fact had severe mitral regurgitation, it would be reasonable to consider elective surgical repair. However, the patient also has achalasia, and there is some concern as to whether or not transesophageal echocardiogram would be safe. However, I suspect that transesophageal echocardiogram could be  performed safely as long as the probe is not advanced all the way into the stomach. We could also potential use a pediatric sized probe to potentially decrease the risk. Intraoperative transesophageal echocardiogram would be necessary at the time of surgery to guide repair. Finally, the patient is currently receiving a prolonged taper course of oral prednisone therapy for unclear reasons. Apparently this was prescribed by Dr. Valentina Lucks.   Plan:  We will discuss with Dr. Mayford Knife the possibility of proceeding with transesophageal echocardiogram to more definitively assess the severity of mitral regurgitation and the feasibility of mitral valve repair. We will also obtain records from Dr. Jone Baseman office to ascertain as to the reason for the patient's current course of steroids. We will obtain MRA of the thoracic and abdominal aorta to rule out significant aortoiliac disease. CT angiogram could be performed instead, but the patient has some type of history of IV contrast allergy to iodine containing contrast. We will plan to see the patient back in 2 weeks for further followup.

## 2011-05-26 ENCOUNTER — Encounter: Payer: BC Managed Care – PPO | Admitting: Thoracic Surgery (Cardiothoracic Vascular Surgery)

## 2011-05-26 ENCOUNTER — Other Ambulatory Visit: Payer: Self-pay | Admitting: Thoracic Surgery (Cardiothoracic Vascular Surgery)

## 2011-05-26 DIAGNOSIS — I7409 Other arterial embolism and thrombosis of abdominal aorta: Secondary | ICD-10-CM

## 2011-05-27 ENCOUNTER — Other Ambulatory Visit: Payer: Self-pay | Admitting: Thoracic Surgery (Cardiothoracic Vascular Surgery)

## 2011-05-27 DIAGNOSIS — I7409 Other arterial embolism and thrombosis of abdominal aorta: Secondary | ICD-10-CM

## 2011-06-01 ENCOUNTER — Other Ambulatory Visit: Payer: Self-pay | Admitting: Thoracic Surgery (Cardiothoracic Vascular Surgery)

## 2011-06-02 ENCOUNTER — Encounter: Payer: Self-pay | Admitting: Critical Care Medicine

## 2011-06-02 ENCOUNTER — Ambulatory Visit (INDEPENDENT_AMBULATORY_CARE_PROVIDER_SITE_OTHER): Payer: BC Managed Care – PPO | Admitting: Critical Care Medicine

## 2011-06-02 DIAGNOSIS — J449 Chronic obstructive pulmonary disease, unspecified: Secondary | ICD-10-CM

## 2011-06-02 DIAGNOSIS — J4489 Other specified chronic obstructive pulmonary disease: Secondary | ICD-10-CM

## 2011-06-02 DIAGNOSIS — J209 Acute bronchitis, unspecified: Secondary | ICD-10-CM

## 2011-06-02 LAB — CREATININE, SERUM: Creat: 1.08 mg/dL (ref 0.50–1.35)

## 2011-06-02 MED ORDER — AZITHROMYCIN 250 MG PO TABS: ORAL_TABLET | ORAL | Status: DC

## 2011-06-02 MED ORDER — TIOTROPIUM BROMIDE MONOHYDRATE 18 MCG IN CAPS: 18.0000 ug | ORAL_CAPSULE | Freq: Every day | RESPIRATORY_TRACT | Status: DC

## 2011-06-02 MED ORDER — ALBUTEROL SULFATE HFA 108 (90 BASE) MCG/ACT IN AERS: 2.0000 | INHALATION_SPRAY | RESPIRATORY_TRACT | Status: DC | PRN

## 2011-06-02 NOTE — Progress Notes (Signed)
Subjective:    Patient ID: Chris Lewis, male    DOB: 11/23/1940, 70 y.o.   MRN: [id number]  HPI This is a 70 year old man with COPD  July 15, 2010 2:28 PM  Was getting better, then awoke 4am with diarrhea and hurt all over. Wife is ill as well.  Also other family members with same illness. Was ok prior to this.  Called in on 11/23 and rx pred and avelox and this helped. Notes dry eyes.  Just had botox injection of esophagus on 07/02/10 Dysphagia is better but heartburn is worse  September 15, 2010 1:50 PM  Is better vs 11/11. First few weeks of Jan was ok until then with the wind and cool air and got dyspneic and awoke congestion. Yellow and green mucus is now productive.  Pt remains dyspneic. Using botox injections on esophagus. Not as effective as before, last rx 12/11.   06/02/2011 Not seen since 1/12.  Noting more cough and congestion. Noting on Echo with Mitral valve regurge.  05/18/11: R/L heart cath>>>not as bad MR.  Saw Dr Cornelius Moras,  For a CT scan of thorax for check.  ??needs a repair. Notes more clear mucus , notes sl pndrip.  Notes some dyspnea with exertion.   Pt denies any significant sore throat, nasal congestion or excess secretions, fever, chills, sweats, unintended weight loss, pleurtic or exertional chest pain, orthopnea PND, or leg swelling Pt denies any increase in rescue therapy over baseline, denies waking up needing it or having any early am or nocturnal exacerbations of coughing/wheezing/or dyspnea. Pt also denies any obvious fluctuation in symptoms with  weather or environmental change or other alleviating or aggravating factors     Review of Systems  HENT: Sore throat: pw.   ignore above entry, error Review of Systems  HENT: Sore throat: pw.   Constitutional:   No  weight loss, night sweats,  Fevers, chills, fatigue, lassitude. HEENT:   No headaches,  Difficulty swallowing,  Tooth/dental problems,  Sore throat,                No sneezing, itching, ear ache,  nasal congestion, post nasal drip,   CV:  No chest pain,  Orthopnea, PND, swelling in lower extremities, anasarca, dizziness, palpitations  GI  No heartburn, indigestion, abdominal pain, nausea, vomiting, diarrhea, change in bowel habits, loss of appetite  Resp: Notes  shortness of breath with exertion not at rest.  Notes  excess mucus, notes  productive cough,  No non-productive cough,  No coughing up of blood.  No change in color of mucus.  No wheezing.  No chest wall deformity  Skin: no rash or lesions.  GU: no dysuria, change in color of urine, no urgency or frequency.  No flank pain.  MS:  No joint pain or swelling.  No decreased range of motion.  No back pain.  Psych:  No change in mood or affect. No depression or anxiety.  No memory loss.     Objective:   Physical Exam  Filed Vitals:   06/02/11 1427  BP: 116/76  Pulse: 74  Temp: 98.4 F (36.9 C)  TempSrc: Oral  Height: 5\' 9"  (1.753 m)  Weight: 163 lb (73.936 kg)  SpO2: 94%    Gen: Pleasant, well-nourished, in no distress,  normal affect  ENT: No lesions,  mouth clear,  oropharynx clear, no postnasal drip  Neck: No JVD, no TMG, no carotid bruits  Lungs: No use of accessory muscles, no dullness  to percussion, few exp wheezes  Cardiovascular: RRR, heart sounds normal, no murmur or gallops, no peripheral edema  Abdomen: soft and NT, no HSM,  BS normal  Musculoskeletal: No deformities, no cyanosis or clubbing  Neuro: alert, non focal  Skin: Warm, no lesions or rashes       Assessment & Plan:   COPD 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  albuterol (VENTOLIN HFA) 108 (90 BASE) MCG/ACT inhaler Inhale 2 puffs into the lungs every 4 (four) hours as needed for wheezing.  1 Inhaler  6  . aspirin 81 MG tablet Take 81 mg by mouth daily.        . Biotin 1000 MCG tablet Take 1,000 mcg by mouth daily.        . calcium carbonate (TUMS EX) 750 MG chewable tablet Chew 1 tablet by mouth as needed.        . calcium carbonate 200 MG capsule Take 600 mg by mouth 2 (two) times daily with a meal.       . Cholecalciferol (VITAMIN D3) 1000 UNITS CAPS Take 2,000 Units by mouth daily.        . clonazePAM (KLONOPIN) 0.5 MG tablet Take 0.5 mg by mouth daily.       Marland Kitchen doxycycline (VIBRA-TABS) 100 MG tablet Take 100 mg by mouth daily.       . Lutein 6 MG CAPS Take by mouth daily.        . metroNIDAZOLE (METROGEL) 0.75 % gel Apply topically 2 (two) times daily.        . multivitamin-lutein (OCUVITE-LUTEIN) CAPS Take 1 capsule by mouth daily.        . predniSONE (DELTASONE) 5 MG tablet Take 5 mg by mouth daily. Pt takes 2.5 tablets daily,  12.5mg  total      . simvastatin (ZOCOR) 40 MG tablet Take 40 mg by mouth daily.        Chris Lewis (EYE DROPS ADVANCED RELIEF OP) Apply 1-2 drops to eye 2 (two) times daily.        Marland Kitchen tiotropium (SPIRIVA) 18 MCG inhalation capsule Place 1 capsule (18 mcg total) into inhaler and inhale daily.  30 capsule  6  . DISCONTD: tiotropium (SPIRIVA) 18 MCG inhalation capsule Place 18 mcg into inhaler and inhale daily.        Marland Kitchen DISCONTD: VENTOLIN HFA 108 (90 BASE) MCG/ACT inhaler USE 1-2 PUFFS EVERY 4 TO 6 HOURS AS NEEDED  1 Inhaler  6  . azithromycin (ZITHROMAX) 250 MG tablet Take two once then one daily until gone  6 each  0  . dextromethorphan-guaiFENesin (MUCINEX DM) 30-600 MG per 12 hr tablet Take 1 tablet by mouth every 12 (twelve) hours.        Marland Kitchen DISCONTD: albuterol-ipratropium (COMBIVENT) 18-103 MCG/ACT inhaler Inhale 2 puffs into the lungs every 6 (six) hours as needed.        Marland Kitchen DISCONTD: beta carotene  w/minerals (OCUVITE) tablet Take 1 tablet by mouth daily.        Marland Kitchen DISCONTD: predniSONE (DELTASONE) 20 MG tablet 5 mg. Tapering down 2 1/2 tablets po every day

## 2011-06-02 NOTE — Patient Instructions (Signed)
Azithromycin for 5days sent to pharmacy with refills on Ventolin/Spiriva Return 4 months  No other med changes

## 2011-06-02 NOTE — Assessment & Plan Note (Addendum)
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

## 2011-06-04 ENCOUNTER — Ambulatory Visit
Admission: RE | Admit: 2011-06-04 | Discharge: 2011-06-04 | Disposition: A | Discharge status: Home / Self Care | Payer: BC Managed Care – PPO | Source: Ambulatory Visit | Attending: Thoracic Surgery (Cardiothoracic Vascular Surgery) | Admitting: Thoracic Surgery (Cardiothoracic Vascular Surgery)

## 2011-06-04 ENCOUNTER — Other Ambulatory Visit: Payer: BC Managed Care – PPO

## 2011-06-04 DIAGNOSIS — I7409 Other arterial embolism and thrombosis of abdominal aorta: Secondary | ICD-10-CM

## 2011-06-04 DIAGNOSIS — I340 Nonrheumatic mitral (valve) insufficiency: Secondary | ICD-10-CM | POA: Insufficient documentation

## 2011-06-04 DIAGNOSIS — I341 Nonrheumatic mitral (valve) prolapse: Secondary | ICD-10-CM | POA: Insufficient documentation

## 2011-06-04 MED ORDER — IOHEXOL 350 MG/ML SOLN: 100.0000 mL | Freq: Once | INTRAVENOUS | Status: AC | PRN

## 2011-06-04 NOTE — Cardiovascular Report (Addendum)
NAME:  Chris Lewis, Chris Lewis NO.:  [id number]  MEDICAL RECORD NO.:  [id number]  LOCATION:                               FACILITY:  [facility]  PHYSICIAN:  Armanda Magic, M.D.     DATE OF BIRTH:  1941-07-11  DATE OF PROCEDURE:  05/18/2011 DATE OF DISCHARGE:                           CARDIAC CATHETERIZATION   REFERRING PHYSICIAN:  Thora Lance, MD  PROCEDURE:  Right and left heart catheterization, coronary angiography, left ventriculography.  OPERATOR:  Armanda Magic, MD  INDICATIONS:  Severe mitral valve prolapse with MR by echo and shortness of breath.  COMPLICATIONS:  None.  IV ACCESS:  Via right femoral artery 4-French sheath, right femoral vein 5-French sheath.  This is a 70 year old male with a history of achalasia treated with Botox injections, dyslipidemia, COPD, and severe mitral valve prolapse of the posterior mitral valve leaflets with moderate to severe MR who presented for heart catheterization in preparation for possible mitral valve repair.  The patient was brought to the cardiac catheterization laboratory in a fasting nonsedated state.  Informed consent was obtained.  The patient was connected to continuous heart rate and pulse oximetry monitoring, intermittent blood monitoring.  The right groin was prepped and draped in sterile fashion.  Xylocaine 1% was used for local anesthesia.  Using modified Seldinger technique, a 5-French sheath was placed in the right femoral vein.  Under fluoroscopic guidance, a Swan-Ganz catheter was advanced into the right atrium under balloon flotation.  Right atrial pressure was obtained and right atrial O2 saturations were obtained. The catheter was then advanced into the right ventricle and right ventricular pressure was obtained.  The catheter was then attempted to be advanced across the pulmonic valve, but after multiple attempts the catheter could not be advanced or pulled back and the catheter was attempted to  pulled back.  The catheter was pulled back and removed under fluoroscopic guidance.  A 4-French JR-4 catheter was placed under fluoroscopic guidance in the vicinity of the left coronary artery could not engage the coronary ostium.  The catheter was exchanged out over a guidewire for a 4-French JL-5 catheter which successfully engaged the left coronary ostium. Multiple cine films were taken at 30 degree RAO, 40 degree LAO views. This catheter was then exchanged out over a guidewire for a 4-French 3-D RCA catheter which successfully engaged the right coronary ostium. Multiple cine films were taken at 30 degree RAO, 40 degree LAO views. This catheter was then exchanged out over a guidewire for a 4-French angled pigtail catheter which was placed under fluoroscopic guidance in left ventricular cavity.  Left ventriculography was performed in the 30 degree RAO view using total of 30 mL of contrast, 12 mL per second.  The catheter was then pulled back across the aortic valve with no significant gradient noted.  At the end of procedure, all catheters and sheaths were removed.  Manual compression was performed until adequate hemostasis was obtained.  The patient was transferred back to room in stable condition.  RESULTS:  Left main coronary artery is widely patent and bifurcates in left anterior descending artery and left circumflex artery.  The left anterior descending artery is  widely patent throughout its course giving rise to a large first diagonal branch which is widely patent in a small second diagonal branch.  The ongoing LAD traverses the apex and is widely patent.  The left circumflex is widely patent throughout its course.  It gives rise to a first obtuse marginal branch which is large and widely patent. It then gives rise to a small second obtuse marginal branch which is widely patent.  It then gives rise to a posterior descending artery which traverses the inferior wall and is  widely patent.  The right coronary artery is nondominant and widely patent giving rise distally to acute RV marginal branch and then terminating in a posterior lateral branch.  The right system is widely patent.  Left ventriculography has normal LV function, EF 60%, LV pressure 113/12 mmHg, aortic pressure 113/66 mmHg, LVEDP 17 mmHg, EF 60%.  ASSESSMENT: 1. Widely patent coronary arteries with left dominant system. 2. Normal left ventricular function. 3. Mitral valve prolapse with severe mitral regurgitation.  PLAN: 1. We will check a 2-D echocardiogram since there was some difficulty     in passing the Swan-Ganz catheter across the pulmonic valve. 2. If 2-D echocardiogram okay, we will discharge home after IV fluid     and bedrest are complete.  I will schedule a CVTS consult with Dr.     Cornelius Moras for mitral valve prolapse with MR for possible mitral valve     repair.  He will follow up with my nurse practitioner in 2 weeks     for groin check.     Armanda Magic, M.D.     TT/MEDQ  D:  05/18/2011  T:  05/18/2011  Job:  [id number]  cc:   Thora Lance, M.D.  Electronically Signed by Armanda Magic M.D. on 06/04/2011 08:21:36 PM

## 2011-06-07 ENCOUNTER — Encounter: Payer: Self-pay | Admitting: Thoracic Surgery (Cardiothoracic Vascular Surgery)

## 2011-06-07 ENCOUNTER — Ambulatory Visit (INDEPENDENT_AMBULATORY_CARE_PROVIDER_SITE_OTHER): Payer: BC Managed Care – PPO | Admitting: Thoracic Surgery (Cardiothoracic Vascular Surgery)

## 2011-06-07 VITALS — BP 122/75 | HR 72 | Resp 18 | Ht 69.0 in | Wt 165.0 lb

## 2011-06-07 DIAGNOSIS — I340 Nonrheumatic mitral (valve) insufficiency: Secondary | ICD-10-CM

## 2011-06-07 DIAGNOSIS — R911 Solitary pulmonary nodule: Secondary | ICD-10-CM | POA: Insufficient documentation

## 2011-06-07 DIAGNOSIS — J984 Other disorders of lung: Secondary | ICD-10-CM

## 2011-06-07 DIAGNOSIS — I341 Nonrheumatic mitral (valve) prolapse: Secondary | ICD-10-CM

## 2011-06-07 DIAGNOSIS — I059 Rheumatic mitral valve disease, unspecified: Secondary | ICD-10-CM

## 2011-06-07 NOTE — Progress Notes (Addendum)
PCP is Lillia Mountain, MD, MD Referring Provider is Lillia Mountain, MD  Chief Complaint  Patient presents with  . Follow-up    2 week f/u, discuss CTA Chest results    HPI: Patient returns for followup of mitral regurgitation. He was originally seen in our office 2 weeks ago and a full consultation note was dictated at that time and is available in EPIC. Since then he underwent CT angiogram of the chest abdomen and pelvis. We also obtained records from his primary care physician's office. He has not yet heard from Dr. Norris Cross office with regards to scheduling a transesophageal echocardiogram. He feels well and reports no new problems or complaints. His review of systems is unchanged.  Past Medical History  Diagnosis Date  . Achalasia   . Hypercholesterolemia   . Chronic obstructive pulmonary disease   . Anxiety   . Seasonal allergic rhinitis   . Transient ischemic attack   . Mitral valve regurgitation   . Osteopenia   . FH: cholecystectomy   . S/P right inguinal herniorrhaphy   . COPD (chronic obstructive pulmonary disease)   . MVP (mitral valve prolapse)   . MR (mitral regurgitation)     Past Surgical History  Procedure Date  . Back surgery     x 3  . Foot surgery   . Esophagogastroduodenoscopy and savary esophageal dilation.     x9  . Eyebrow surgery 2012  . Rt inguinal herniorrhaphy 1969/1997  . Cholecystectomy   . Esophagoscopy w/ botox injection     x9 beginning 2008, most recent Sept. 2012    Family History  Problem Relation Age of Onset  . Stroke Father 49    cva  . Scleroderma Mother 52  . Lung cancer Sister   . Heart disease Sister     Social History History  Substance Use Topics  . Smoking status: Former Smoker -- 1.5 packs/day for 38 years    Types: Cigarettes    Quit date: 08/16/1997  . Smokeless tobacco: Never Used  . Alcohol Use: No    Current Outpatient Prescriptions  Medication Sig Dispense Refill  . albuterol (VENTOLIN HFA)  108 (90 BASE) MCG/ACT inhaler Inhale 2 puffs into the lungs every 4 (four) hours as needed for wheezing.  1 Inhaler  6  . aspirin 81 MG tablet Take 81 mg by mouth daily.        Marland Kitchen azithromycin (ZITHROMAX) 250 MG tablet Take two once then one daily until gone  6 each  0  . Biotin 1000 MCG tablet Take 1,000 mcg by mouth daily.        . calcium carbonate (TUMS EX) 750 MG chewable tablet Chew 1 tablet by mouth as needed.        . calcium carbonate 200 MG capsule Take 600 mg by mouth 2 (two) times daily with a meal.       . Cholecalciferol (VITAMIN D3) 1000 UNITS CAPS Take 2,000 Units by mouth daily.        . clonazePAM (KLONOPIN) 0.5 MG tablet Take 0.5 mg by mouth daily.       Marland Kitchen dextromethorphan-guaiFENesin (MUCINEX DM) 30-600 MG per 12 hr tablet Take 1 tablet by mouth every 12 (twelve) hours.        Marland Kitchen doxycycline (VIBRA-TABS) 100 MG tablet Take 100 mg by mouth daily.       . Lutein 6 MG CAPS Take by mouth daily.        . metroNIDAZOLE (METROGEL) 0.75 %  gel Apply topically 2 (two) times daily.        . multivitamin-lutein (OCUVITE-LUTEIN) CAPS Take 1 capsule by mouth daily.        . predniSONE (DELTASONE) 5 MG tablet Take 5 mg by mouth daily. Pt takes 2.5 tablets daily,  12.5mg  total      . simvastatin (ZOCOR) 40 MG tablet Take 40 mg by mouth daily.        Rocco Pauls (EYE DROPS ADVANCED RELIEF OP) Apply 1-2 drops to eye 2 (two) times daily.        Marland Kitchen tiotropium (SPIRIVA) 18 MCG inhalation capsule Place 1 capsule (18 mcg total) into inhaler and inhale daily.  30 capsule  6    Allergies  Allergen Reactions  . Contrast Media (Iodinated Diagnostic Agents) Rash    Heavy rash and itching 20 yrs ago. Needs full premeds and does well with this.  . Atorvastatin     REACTION: muscle pain  . Rosuvastatin     REACTION: muscle weakness    Review of Systems: Unchanged  BP 122/75  Pulse 72  Resp 18  Ht 5\' 9"  (1.753 m)  Wt 165 lb (74.844 kg)  BMI 24.37 kg/m2  SpO2 94% Physical  Exam: Unchanged  Diagnostic Tests: CT angiogram of the chest abdomen and pelvis performed 06/04/2011 is reviewed. This demonstrates mild atherosclerotic disease afflicting the descending thoracic and abdominal aorta. There is some eccentric plaque located in the descending abdominal aorta below the renal arteries. This is fairly mild and there is minimal associated calcification. There are no flow limiting lesions noted in there are no significant intraluminal filling defects appreciated. There was a very small 4 mm benign-appearing nodule appreciated in the right upper lobe. This does not appear very suspicious that all.  Impression: Mitral valve prolapse with mitral regurgitation. The patient has very mild symptoms of exertional shortness of breath which are quite stable. Transesophageal echocardiogram would be most helpful to ascertain exactly how severe the mitral regurgitation is at this point in time, to see whether or not there is presence of a flail leaflet, and to get a better feel for how likely it would be that his valve should be repairable at the time of elective surgery. The patient does have achalasia. However, recent barium swallow does not reveal any signs of complications related to achalasia, and I suspect that with care a transesophageal echocardiogram could be completed with fairly low risk, particularly if no attempt is made to advance the probe through the GE junction. Based upon recent CT angiogram, I feel that the patient may be a good candidate for minimally invasive approach for mitral valve repair once we can ascertain whether or not he in fact has severe mitral regurgitation.  Plan: We will touch base with Dr. Mayford Knife with regards to possible plans for transesophageal echocardiogram. We will see the patient back in followup once this has been completed.   We obtain records from Dr. Jone Baseman office. Mr. Vosler increased dose of prednisone has been diagnosed to treat  polymyalgia rheumatica. He continues on a slow taper.

## 2011-07-01 ENCOUNTER — Other Ambulatory Visit: Payer: Self-pay | Admitting: Cardiology

## 2011-07-01 ENCOUNTER — Encounter: Payer: Self-pay | Admitting: Cardiology

## 2011-07-01 NOTE — H&P (Signed)
Subjective:     CC:    1. TT/PRE TEE WORK-UP/424.0/LABS BMET/CBC WITH DIFF/PT PANEL/SEE [name].        HPI:  General:  Mr Tetrault is a 70 yo male followed by Dr Mayford Knife with a hx of MVP with significant MR. He also has a hx of COPD and GERD. He is being evaluated for possible MR surgery by Dr Barry Dienes. Recent Cardiac Cath with patent vessels and normal LV function. Dr Mayford Knife plans to proceed with TEE to further evaluate Mitral valve per Dr Orvan July request. He has a history of achalasia. He says that his SOB is stable and he has not needed his inhalers. He denies any chest pain, but does have epigastric soreness chronically. .  Patient denies chest pain, palpitations, dizziness, syncope, swelling, nor PND. no SOB at rest.       ROS:  as noted in HPI, slight nasuea this am after eating out in diner this am, with mild diarrhea, otherwise no complaints, no back pain, no neurological complaints. no cough, congestion, weakness, dizziness, no falls.       Medical History: achalasia treated with Botox injections, Hypercholesterolemia, COPD, Wright, Anxiety, Seasonal allergic rhinitis, TIA,2005, posterior MV leaflet MVP with moderate to severe MR, mild TR/PR, mild LAE by echo 04/29/2011, Osteopenia, t-1.9 and -2.3 back and hip, 9/08, ALLERGY TO IODINATED CONTRAST, 10/12 cardiac cath with patent vessels and normal LV function.        Surgical History: cholecystectomy, [facility]rell Lolling , right inguinal herniorrhaphy 1969/1997, back surgery x 3, Robinson , foot surgery 2008, EGD , eyebrow surgery 2012.        Family History: Father: deceased 66 yrs CVA Mother: deceased 82 yrs Scleroderma Sister 1: alive 54 yrs Lung cancer, pacemaker Sister 2: alive 87 yrs 2 sister(s) . 1 son(s) , 1 daughter(s) .  Significant GI family history: None.       Social History:  General: History of smoking cigarettes: Former smoker, Quit in year 1999, Pack-year Hx: 54. no Smoking. 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 Caffeine: yes. no Recreational drug use. Exercise: minimal. Occupation: retired, Naval architect. Marital Status: Married.        Medications: Calcium 600 + D 600-200 MG-UNIT Tablet 1 tablet twice a day, Multivitamins Tablet one tablet once a day, Aspirin 81 MG Tablet 1 tablet Once a day, Metrogel 2% Gel 1 application a thin layer to affected area Once a day, Clonazepam 0.5 MG Tablet 1 tablet as needed each evening, Lutein 6 MG Capsule 1 capsule Daily, Simvastatin 40 MG Tablet 1 tablet every evening Once a day, Albuterol Sulfate HFA 108 (90 Base) MCG/ACT Aerosol Solution 2 puffs as needed every 4 hrs, Spiriva HandiHaler 18 MCG Capsule 1 capsule, 2 puffs Once a day, PredniSONE 5 MG 5 MG Tablet 2 tablets once a day, Biotin 1000 MCG Tablet 1 tablet Once a day, Vitamin D3 1000 UNIT Tablet 1 tablet Once a day, Tums 500 MG Tablet Chewable 1 tablet PRN, OTC Advanced Eye Relief 1-2 drops morn & evening, Medication List reviewed and reconciled with the patient       Allergies: iodinated contrast dye, Lipitor: muscle aches.       Objective:     Vitals: Wt 165.6, Wt change .8 lb, Ht 68.5, BMI 24.81, Pulse sitting 72, BP sitting 110/84.       Examination:  Cardiology, General:  GENERAL APPEARANCE: pleasant, NAD. HEENT: unremarkable. CAROTID UPSTROKE: normal, no bruit. JVD: flat. HEART  SOUNDS: regular, normal S1, S2, no S3 or S4. MURMUR: absent. LUNGS: no rales or wheezes. ABDOMEN: soft, non tender, positive bowel sounds, no masses felt. EXTREMITIES: no leg edema. PERIPHERAL PULSES: 2 plus bilateral.        Assessment:     Assessment:  1. Mitral valve disorders - 424.0 (Primary)    Plan:     1. Mitral valve disorders  LAB: BASIC METABOLIC    GLUCOSE 88 70-99 - MG/DL    BUN 15 0-98 - MG/DL    CREATININE 1.19 1.47-8.29 - MG/DL    eGFR (NON-AFRICAN AMERICAN) 69.43 >60.00 - ML/MIN    eGFR (AFRICAN AMERICAN) 84.01 >60.00 - ML/MIN    SODIUM 144 136-145 - MMOL/L    POTASSIUM 4.1 3.5-5.5  - MMOL/L    CHLORIDE 103 98-107 - MMOL/L    C02 30 22-32 - MMOL/L    ANION GAP 15.1 6.0-20.0 - MMOL/L    CALCIUM 9.1 8.6-10.3 - MG/DL     FERGUSON,CYNTHIA A 06/30/2011 07:46:27 PM > ok for TEE McVey,[name] 07/01/2011 08:44:49 AM >    LAB: CBC WITH DIFF    WBC 6.8 4.0-11.0 - K/uL    RBC 5.18 4.20-5.80 - M/uL    HGB 16.0 13.0-17.0 - g/dL    HCT 56.2 13.0-86.5 - %    MCV 92.3 80.0-94.0 - fL    MCHC 33.5 32.0-36.0 - g/dL    RDW 78.4 69.6-29.5 - %    PLT 152 150-400 - K/uL    NEUT % 67.2 43.3-71.9 - %    LYMPH% 19.9 16.8-43.5 - %    MONO % 9.8 4.6-12.4 - %    EOS % 2.70 0.00-7.80 - %    BASO % 0.4 0.0-1.0 - %    NEUT # 4.5 1.9-7.2 - K/uL    LYMPH# 1.40 1.10-2.73 - K/uL    MONO # 0.7 0.3-0.8 - K/uL    EOS # 0.20 0.00-0.60 - K/uL    BASO # 0.00 0.00-0.10 - K/uL     FERGUSON,CYNTHIA A 06/30/2011 07:46:48 PM > ok for TEE McVey,[name] 07/01/2011 08:44:29 AM >    LAB: PT and PTT (284132) ,LC    aPTT 28 24-33 - sec    Prothrombin Time 10.8 9.1-12.0 - sec    INR 1.0 0.8-1.2 -     FERGUSON,CYNTHIA A 07/01/2011 08:15:36 AM > ok     will proceed with TEE with possible risk incluidng injury to esophagus, reasction to sedation and respiratory effects reviewed. questions answered and pt is agreeable to proceed.         Immunizations:        Labs:        Procedure Codes: 44010 ECL BMP, 85025 ECL CBC PLATELET DIFF, 85730 PTT, 85610 ZZPROTHROMBIN TIME, 85610 PROTHROMBIN TIME, 36415 BLOOD COLLECTION ROUTINE VENIPUNCTURE       Preventive:         Follow Up: TT as scheduled (Reason: Mitral valve prolapse.)      Provider: Michaell Cowing. Emelda Fear, NP  Patient: Lonell, Stamos DOB: 02/06/1941 Date: 06/30/2011

## 2011-07-06 ENCOUNTER — Encounter (HOSPITAL_COMMUNITY)
Admission: RE | Disposition: A | Discharge status: Home / Self Care | Payer: Self-pay | Source: Ambulatory Visit | Attending: Cardiology

## 2011-07-06 ENCOUNTER — Encounter (HOSPITAL_COMMUNITY): Payer: Self-pay | Admitting: *Deleted

## 2011-07-06 ENCOUNTER — Ambulatory Visit (HOSPITAL_COMMUNITY)
Admission: RE | Admit: 2011-07-06 | Discharge: 2011-07-06 | Disposition: A | Discharge status: Home / Self Care | Payer: BC Managed Care – PPO | Source: Ambulatory Visit | Attending: Cardiology | Admitting: Cardiology

## 2011-07-06 DIAGNOSIS — I059 Rheumatic mitral valve disease, unspecified: Secondary | ICD-10-CM | POA: Insufficient documentation

## 2011-07-06 DIAGNOSIS — R0602 Shortness of breath: Secondary | ICD-10-CM | POA: Insufficient documentation

## 2011-07-06 DIAGNOSIS — J4489 Other specified chronic obstructive pulmonary disease: Secondary | ICD-10-CM | POA: Insufficient documentation

## 2011-07-06 DIAGNOSIS — J449 Chronic obstructive pulmonary disease, unspecified: Secondary | ICD-10-CM | POA: Insufficient documentation

## 2011-07-06 DIAGNOSIS — K219 Gastro-esophageal reflux disease without esophagitis: Secondary | ICD-10-CM | POA: Insufficient documentation

## 2011-07-06 HISTORY — PX: TEE WITHOUT CARDIOVERSION: SHX5443

## 2011-07-06 HISTORY — DX: Gastro-esophageal reflux disease without esophagitis: K21.9

## 2011-07-06 HISTORY — DX: Diaphragmatic hernia without obstruction or gangrene: K44.9

## 2011-07-06 HISTORY — DX: Cerebral infarction, unspecified: I63.9

## 2011-07-06 HISTORY — DX: Unspecified osteoarthritis, unspecified site: M19.90

## 2011-07-06 SURGERY — ECHOCARDIOGRAM, TRANSESOPHAGEAL
Anesthesia: Moderate Sedation

## 2011-07-06 MED ORDER — FENTANYL CITRATE 0.05 MG/ML IJ SOLN
INTRAMUSCULAR | Status: AC
  Filled 2011-07-06: qty 2

## 2011-07-06 MED ORDER — FENTANYL CITRATE 0.05 MG/ML IJ SOLN
INTRAMUSCULAR | Status: DC | PRN
  Administered 2011-07-06: 25 ug via INTRAVENOUS

## 2011-07-06 MED ORDER — MIDAZOLAM HCL 10 MG/2ML IJ SOLN
INTRAMUSCULAR | Status: AC
  Filled 2011-07-06: qty 2

## 2011-07-06 MED ORDER — LIDOCAINE VISCOUS 2 % MT SOLN
OROMUCOSAL | Status: AC
  Filled 2011-07-06: qty 15

## 2011-07-06 MED ORDER — MIDAZOLAM HCL 10 MG/2ML IJ SOLN
INTRAMUSCULAR | Status: DC | PRN
  Administered 2011-07-06: 2 mg via INTRAVENOUS

## 2011-07-06 MED ORDER — SODIUM CHLORIDE 0.45 % IV SOLN
INTRAVENOUS | Status: DC
  Administered 2011-07-06: 500 mL via INTRAVENOUS

## 2011-07-06 NOTE — Procedures (Signed)
Low normal LVF EF 50% Normal trileaflet AV with no AI Normal TV with mild TR Normal PV with mild PR Moderately dilated LA with normal LA appendage Normal RA Normal RV Severe MVP of the posterior MV leaflet with possible ruptured chordae tendinae and severe MR eccentrically directed towards the septum and wraps around the entire LA with 2 large jets. Normal IAS with no evidence of PFO by colorflow doppler Normal thoracic aorat

## 2011-07-06 NOTE — H&P (View-Only) (Signed)
Subjective:     CC:    1. TT/PRE TEE WORK-UP/424.0/LABS BMET/CBC WITH DIFF/PT PANEL/SEE [name].        HPI:  General:  Mr Chris Lewis is a 70 yo male followed by Dr Turner with a hx of MVP with significant MR. He also has a hx of COPD and GERD. He is being evaluated for possible MR surgery by Dr Owens. Recent Cardiac Cath with patent vessels and normal LV function. Dr Turner plans to proceed with TEE to further evaluate Mitral valve per Dr [name]'s request. He has a history of achalasia. He says that his SOB is stable and he has not needed his inhalers. He denies any chest pain, but does have epigastric soreness chronically. .  Patient denies chest pain, palpitations, dizziness, syncope, swelling, nor PND. no SOB at rest.       ROS:  as noted in HPI, slight nasuea this am after eating out in diner this am, with mild diarrhea, otherwise no complaints, no back pain, no neurological complaints. no cough, congestion, weakness, dizziness, no falls.       Medical History: achalasia treated with Botox injections, Hypercholesterolemia, COPD, Wright, Anxiety, Seasonal allergic rhinitis, TIA,2005, posterior MV leaflet MVP with moderate to severe MR, mild TR/PR, mild LAE by echo 04/29/2011, Osteopenia, t-1.9 and -2.3 back and hip, 9/08, ALLERGY TO IODINATED CONTRAST, 10/12 cardiac cath with patent vessels and normal LV function.        Surgical History: cholecystectomy, [place] , right inguinal herniorrhaphy 1969/1997, back surgery x 3, Robinson , foot surgery 2008, EGD , eyebrow surgery 2012.        Family History: Father: deceased 94 yrs CVA Mother: deceased 84 yrs Scleroderma Sister 1: alive 74 yrs Lung cancer, pacemaker Sister 2: alive 76 yrs 2 sister(s) . 1 son(s) , 1 daughter(s) .  Significant GI family history: None.       Social History:  General: History of smoking cigarettes: Former smoker, Quit in year 1999, Pack-year Hx: 54. no Smoking. 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 Caffeine: yes. no Recreational drug use. Exercise: minimal. Occupation: retired, purchasing agent. Marital Status: Married.        Medications: Calcium 600 + D 600-200 MG-UNIT Tablet 1 tablet twice a day, Multivitamins Tablet one tablet once a day, Aspirin 81 MG Tablet 1 tablet Once a day, Metrogel 2% Gel 1 application a thin layer to affected area Once a day, Clonazepam 0.5 MG Tablet 1 tablet as needed each evening, Lutein 6 MG Capsule 1 capsule Daily, Simvastatin 40 MG Tablet 1 tablet every evening Once a day, Albuterol Sulfate HFA 108 (90 Base) MCG/ACT Aerosol Solution 2 puffs as needed every 4 hrs, Spiriva HandiHaler 18 MCG Capsule 1 capsule, 2 puffs Once a day, PredniSONE 5 MG 5 MG Tablet 2 tablets once a day, Biotin 1000 MCG Tablet 1 tablet Once a day, Vitamin D3 1000 UNIT Tablet 1 tablet Once a day, Tums 500 MG Tablet Chewable 1 tablet PRN, OTC Advanced Eye Relief 1-2 drops morn & evening, Medication List reviewed and reconciled with the patient       Allergies: iodinated contrast dye, Lipitor: muscle aches.       Objective:     Vitals: Wt 165.6, Wt change .8 lb, Ht 68.5, BMI 24.81, Pulse sitting 72, BP sitting 110/84.       Examination:  Cardiology, General:  GENERAL APPEARANCE: pleasant, NAD. HEENT: unremarkable. CAROTID UPSTROKE: normal, no bruit. JVD: flat. HEART   SOUNDS: regular, normal S1, S2, no S3 or S4. MURMUR: absent. LUNGS: no rales or wheezes. ABDOMEN: soft, non tender, positive bowel sounds, no masses felt. EXTREMITIES: no leg edema. PERIPHERAL PULSES: 2 plus bilateral.        Assessment:     Assessment:  1. Mitral valve disorders - 424.0 (Primary)    Plan:     1. Mitral valve disorders  LAB: BASIC METABOLIC    GLUCOSE 88 70-99 - MG/DL    BUN 15 6-26 - MG/DL    CREATININE 1.05 0.60-1.30 - MG/DL    eGFR (NON-AFRICAN AMERICAN) 69.43 >60.00 - ML/MIN    eGFR (AFRICAN AMERICAN) 84.01 >60.00 - ML/MIN    SODIUM 144 136-145 - MMOL/L    POTASSIUM 4.1 3.5-5.5  - MMOL/L    CHLORIDE 103 98-107 - MMOL/L    C02 30 22-32 - MMOL/L    ANION GAP 15.1 6.0-20.0 - MMOL/L    CALCIUM 9.1 8.6-10.3 - MG/DL     FERGUSON,CYNTHIA A 06/30/2011 07:46:27 PM > ok for TEE McVey,[name] 07/01/2011 08:44:49 AM >    LAB: CBC WITH DIFF    WBC 6.8 4.0-11.0 - K/uL    RBC 5.18 4.20-5.80 - M/uL    HGB 16.0 13.0-17.0 - g/dL    HCT 47.8 39.0-52.0 - %    MCV 92.3 80.0-94.0 - fL    MCHC 33.5 32.0-36.0 - g/dL    RDW 13.3 11.5-15.5 - %    PLT 152 150-400 - K/uL    NEUT % 67.2 43.3-71.9 - %    LYMPH% 19.9 16.8-43.5 - %    MONO % 9.8 4.6-12.4 - %    EOS % 2.70 0.00-7.80 - %    BASO % 0.4 0.0-1.0 - %    NEUT # 4.5 1.9-7.2 - K/uL    LYMPH# 1.40 1.10-2.73 - K/uL    MONO # 0.7 0.3-0.8 - K/uL    EOS # 0.20 0.00-0.60 - K/uL    BASO # 0.00 0.00-0.10 - K/uL     FERGUSON,CYNTHIA A 06/30/2011 07:46:48 PM > ok for TEE McVey,[name] 07/01/2011 08:44:29 AM >    LAB: PT and PTT (020321) ,LC    aPTT 28 24-33 - sec    Prothrombin Time 10.8 9.1-12.0 - sec    INR 1.0 0.8-1.2 -     FERGUSON,CYNTHIA A 07/01/2011 08:15:36 AM > ok     will proceed with TEE with possible risk incluidng injury to esophagus, reasction to sedation and respiratory effects reviewed. questions answered and pt is agreeable to proceed.         Immunizations:        Labs:        Procedure Codes: 80048 ECL BMP, 85025 ECL CBC PLATELET DIFF, 85730 PTT, 85610 ZZPROTHROMBIN TIME, 85610 PROTHROMBIN TIME, 36415 BLOOD COLLECTION ROUTINE VENIPUNCTURE       Preventive:         Follow Up: TT as scheduled (Reason: Mitral valve prolapse.)      Provider: Cynthia A. Ferguson, NP  Patient: Lankford, Yukio A DOB: 05/06/1941 Date: 06/30/2011     

## 2011-07-06 NOTE — Interval H&P Note (Signed)
History and Physical Interval Note:   07/06/2011   10:46 AM   Chris Lewis  has presented today for surgery, with the diagnosis of mitral valve eval  The various methods of treatment have been discussed with the patient and family. After consideration of risks, benefits and other options for treatment, the patient has consented to  Procedure(s): TRANSESOPHAGEAL ECHOCARDIOGRAM (TEE) as a surgical intervention .  The patients' history has been reviewed, patient examined, no change in status, stable for surgery.  I have reviewed the patients' chart and labs.  Questions were answered to the patient's satisfaction.     Quintella Reichert  MD

## 2011-07-07 ENCOUNTER — Encounter (HOSPITAL_COMMUNITY): Payer: Self-pay

## 2011-07-12 ENCOUNTER — Ambulatory Visit (INDEPENDENT_AMBULATORY_CARE_PROVIDER_SITE_OTHER): Payer: BC Managed Care – PPO | Admitting: Thoracic Surgery (Cardiothoracic Vascular Surgery)

## 2011-07-12 ENCOUNTER — Encounter: Payer: Self-pay | Admitting: Thoracic Surgery (Cardiothoracic Vascular Surgery)

## 2011-07-12 VITALS — BP 109/73 | HR 68 | Temp 96.8°F | Resp 20 | Ht 69.0 in | Wt 165.0 lb

## 2011-07-12 DIAGNOSIS — J984 Other disorders of lung: Secondary | ICD-10-CM

## 2011-07-12 DIAGNOSIS — I059 Rheumatic mitral valve disease, unspecified: Secondary | ICD-10-CM

## 2011-07-12 MED ORDER — AMIODARONE HCL 200 MG PO TABS: 200.0000 mg | ORAL_TABLET | Freq: Two times a day (BID) | ORAL | Status: DC

## 2011-07-12 NOTE — Patient Instructions (Signed)
Begin amiodarone one week prior to surgery. Stop aspirin two weeks prior to surgery.

## 2011-07-12 NOTE — Progress Notes (Signed)
[address]            [place] [address]          [phone]     CARDIOTHORACIC SURGERY OFFICE NOTE  Referring Provider is Lillia Mountain, MD PCP is Lillia Mountain, MD, MD   HPI:  Patient returns for follow-up of mitral regurgitation.  He was last seen here in the office on 06/07/2011. Last week he underwent transesophageal echocardiogram by Dr. Mayford Knife. This procedure was tolerated well despite the fact patient has achalasia. Transesophageal echocardiogram confirmed the presence of severe mitral regurgitation with a flail segment of the posterior leaflet of the mitral valve. Left ventricular systolic function was normal with ejection fraction estimated 50-55%. No other significant abnormalities were noted.  The patient reports that overall he remains essentially stable. He does complain of continued progression of exertional fatigue and shortness of breath. Last week he states that he had a brief viral illness that was manifested as generalized arthralgias and myalgias without fever. These symptoms resolved fairly rapidly and he states that he is now essentially back to his baseline. His review of systems is otherwise unchanged from previously.   Current Outpatient Prescriptions  Medication Sig Dispense Refill  . albuterol (VENTOLIN HFA) 108 (90 BASE) MCG/ACT inhaler Inhale 2 puffs into the lungs every 4 (four) hours as needed for wheezing.  1 Inhaler  6  . aspirin 81 MG tablet Take 81 mg by mouth daily.        . Biotin 1000 MCG tablet Take 1,000 mcg by mouth daily.        . calcium carbonate (TUMS EX) 750 MG chewable tablet Chew 1 tablet by mouth as needed.       . calcium carbonate 200 MG capsule Take 600 mg by mouth 2 (two) times daily with a meal.       . Cholecalciferol (VITAMIN D3) 1000 UNITS CAPS Take 1,000 Units by mouth daily.       . clonazePAM (KLONOPIN) 0.5 MG tablet Take 0.5 mg by mouth at bedtime as needed.       . Lutein 6 MG  CAPS Take by mouth daily.        . metroNIDAZOLE (METROGEL) 1 % gel Apply 1 application topically.        . predniSONE (DELTASONE) 5 MG tablet Take 5 mg by mouth daily. Pt takes 2 tablets daily      . simvastatin (ZOCOR) 40 MG tablet Take 40 mg by mouth daily.        Rocco Pauls (EYE DROPS ADVANCED RELIEF OP) Apply 1-2 drops to eye 2 (two) times daily.        Marland Kitchen tiotropium (SPIRIVA) 18 MCG inhalation capsule Place 1 capsule (18 mcg total) into inhaler and inhale daily.  30 capsule  6  . amiodarone (PACERONE) 200 MG tablet Take 1 tablet (200 mg total) by mouth 2 (two) times daily. Begin 7 days prior to surgery.  14 tablet  0      Physical Exam:   BP 109/73  Pulse 68  Temp(Src) 96.8 F (36 C) (Oral)  Resp 20  Ht 5\' 9"  (1.753 m)  Wt 165 lb (74.844 kg)  BMI 24.37 kg/m2  SpO2 96%  Auscultation of the chest demonstrates clear breath sounds which are symmetrical bilaterally. Cardiovascular exam confirms regular rate and rhythm with a grade 4/6 holosystolic murmur heard  best along the sternal border. The abdomen is soft and nontender. Extremities are warm and well-perfused. There is no lower extremity edema.  Diagnostic Tests:  Transesophageal echocardiogram performed 07/06/2011 is reviewed. This confirmed the presence of mitral valve prolapse with a flail segment of the middle scallop of the posterior leaflet and severe mitral regurgitation. There are no other complicating features. 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 There should be a high likelihood for successful valve repair.  Impression:  Mitral valve prolapse with severe mitral regurgitation and partially flail posterior leaflet. The patient appears to be reasonably good candidate for elective mitral valve repair using minimally invasive approach. Risks associated with surgery will be slightly increased due to his other comorbid medical conditions, but overall  the risks of surgery should be quite low and there should be a high likelihood that his valve should be repairable. The patient does have achalasia but he tolerated transesophageal echocardiogram well.  Plan:  The rationale for elective mitral valve repair surgery has been explained, including a comparison between surgery and continued medical therapy with close follow-up.  The likelihood of successful and durable valve repair has been discussed with particular reference to the findings of their recent echocardiogram.  Based upon these findings and previous experience, I have quoted them a greater than 95% percent likelihood of successful valve repair.  In the unlikely event that their valve cannot be successfully repaired, we discussed the possibility of replacing the mitral valve using a mechanical prosthesis with the attendant need for long-term anticoagulation versus the alternative of replacing it using a bioprosthetic tissue valve with its potential for late structural valve deterioration and failure, depending upon the patient's longevity.  Alternative surgical approaches have been discussed, including a comparison between conventional sternotomy and minimally-invasive techniques.  The relative risks and benefits of each have been reviewed as they pertain to the patient's specific circumstances, and all of their questions have been addressed.  The patient and his wife desire to proceed with elective surgery before the end of the year. We tentatively plan for minimally invasive mitral valve repair on Tuesday, December 18. I've given the patient a prescription for amiodarone to begin one week prior to surgery to decrease his risk of perioperative atrial arrhythmias. I've instructed him to stop taking aspirin 2 weeks prior to surgery. We will plan to see him back for final followup on Monday, December 17. All of his questions been addressed.    Salvatore Decent. Cornelius Moras, MD

## 2011-07-13 ENCOUNTER — Other Ambulatory Visit: Payer: Self-pay | Admitting: Thoracic Surgery (Cardiothoracic Vascular Surgery)

## 2011-07-13 DIAGNOSIS — I359 Nonrheumatic aortic valve disorder, unspecified: Secondary | ICD-10-CM

## 2011-07-13 DIAGNOSIS — I059 Rheumatic mitral valve disease, unspecified: Secondary | ICD-10-CM

## 2011-07-14 ENCOUNTER — Other Ambulatory Visit: Payer: Self-pay

## 2011-07-14 ENCOUNTER — Encounter (HOSPITAL_COMMUNITY): Payer: Self-pay | Admitting: Cardiology

## 2011-07-14 DIAGNOSIS — I059 Rheumatic mitral valve disease, unspecified: Secondary | ICD-10-CM

## 2011-07-23 ENCOUNTER — Encounter (HOSPITAL_COMMUNITY): Payer: Self-pay

## 2011-07-30 ENCOUNTER — Encounter (HOSPITAL_COMMUNITY)
Admission: RE | Admit: 2011-07-30 | Discharge: 2011-07-30 | Disposition: A | Discharge status: Home / Self Care | Payer: BC Managed Care – PPO | Source: Ambulatory Visit | Attending: Thoracic Surgery (Cardiothoracic Vascular Surgery) | Admitting: Thoracic Surgery (Cardiothoracic Vascular Surgery)

## 2011-07-30 ENCOUNTER — Ambulatory Visit (HOSPITAL_COMMUNITY)
Admission: RE | Admit: 2011-07-30 | Discharge: 2011-07-30 | Disposition: A | Discharge status: Home / Self Care | Payer: BC Managed Care – PPO | Source: Ambulatory Visit | Attending: Thoracic Surgery (Cardiothoracic Vascular Surgery) | Admitting: Thoracic Surgery (Cardiothoracic Vascular Surgery)

## 2011-07-30 ENCOUNTER — Encounter (HOSPITAL_COMMUNITY): Payer: Self-pay

## 2011-07-30 ENCOUNTER — Other Ambulatory Visit: Payer: Self-pay | Admitting: Cardiology

## 2011-07-30 ENCOUNTER — Other Ambulatory Visit: Payer: Self-pay

## 2011-07-30 ENCOUNTER — Inpatient Hospital Stay (HOSPITAL_COMMUNITY)
Admission: RE | Admit: 2011-07-30 | Discharge: 2011-07-30 | Disposition: A | Discharge status: Home / Self Care | Payer: BC Managed Care – PPO | Source: Ambulatory Visit | Attending: Thoracic Surgery (Cardiothoracic Vascular Surgery) | Admitting: Thoracic Surgery (Cardiothoracic Vascular Surgery)

## 2011-07-30 DIAGNOSIS — Z01818 Encounter for other preprocedural examination: Secondary | ICD-10-CM | POA: Insufficient documentation

## 2011-07-30 DIAGNOSIS — R0602 Shortness of breath: Secondary | ICD-10-CM | POA: Insufficient documentation

## 2011-07-30 DIAGNOSIS — I059 Rheumatic mitral valve disease, unspecified: Secondary | ICD-10-CM

## 2011-07-30 DIAGNOSIS — Z01810 Encounter for preprocedural cardiovascular examination: Secondary | ICD-10-CM | POA: Insufficient documentation

## 2011-07-30 DIAGNOSIS — J449 Chronic obstructive pulmonary disease, unspecified: Secondary | ICD-10-CM | POA: Insufficient documentation

## 2011-07-30 DIAGNOSIS — J4489 Other specified chronic obstructive pulmonary disease: Secondary | ICD-10-CM | POA: Insufficient documentation

## 2011-07-30 DIAGNOSIS — Z01812 Encounter for preprocedural laboratory examination: Secondary | ICD-10-CM | POA: Insufficient documentation

## 2011-07-30 DIAGNOSIS — R0789 Other chest pain: Secondary | ICD-10-CM | POA: Insufficient documentation

## 2011-07-30 DIAGNOSIS — F17200 Nicotine dependence, unspecified, uncomplicated: Secondary | ICD-10-CM | POA: Insufficient documentation

## 2011-07-30 HISTORY — DX: Depression, unspecified: F32.A

## 2011-07-30 HISTORY — DX: Major depressive disorder, single episode, unspecified: F32.9

## 2011-07-30 LAB — COMPREHENSIVE METABOLIC PANEL
Alkaline Phosphatase: 66 U/L (ref 39–117)
BUN: 13 mg/dL (ref 6–23)
GFR calc Af Amer: >90 mL/min (ref >90)
Glucose, Bld: 112 mg/dL — ABNORMAL HIGH (ref 70–99)
Potassium: 3.9 mEq/L (ref 3.5–5.1)
Total Bilirubin: 0.5 mg/dL (ref 0.3–1.2)
Total Protein: 6.5 g/dL (ref 6.0–8.3)

## 2011-07-30 LAB — BLOOD GAS, ARTERIAL
Bicarbonate: 25.5 mEq/L — ABNORMAL HIGH (ref 20.0–24.0)
FIO2: 0.21 %
O2 Saturation: 94.8 %
Patient temperature: 98.6
TCO2: 26.7 mmol/L (ref 0–100)

## 2011-07-30 LAB — URINALYSIS, ROUTINE W REFLEX MICROSCOPIC
Ketones, ur: NEGATIVE mg/dL
Leukocytes, UA: NEGATIVE
Nitrite: NEGATIVE
Protein, ur: NEGATIVE mg/dL

## 2011-07-30 LAB — SURGICAL PCR SCREEN
MRSA, PCR: NEGATIVE
Staphylococcus aureus: NEGATIVE

## 2011-07-30 LAB — ABO/RH: ABO/RH(D): A POS

## 2011-07-30 LAB — PROTIME-INR: Prothrombin Time: 13 seconds (ref 11.6–15.2)

## 2011-07-30 LAB — PULMONARY FUNCTION TEST

## 2011-07-30 LAB — CBC
HCT: 46 % (ref 39.0–52.0)
Hemoglobin: 15.9 g/dL (ref 13.0–17.0)
MCHC: 34.6 g/dL (ref 30.0–36.0)
MCV: 91.1 fL (ref 78.0–100.0)

## 2011-07-30 MED ORDER — ALBUTEROL SULFATE (5 MG/ML) 0.5% IN NEBU
2.5000 mg | INHALATION_SOLUTION | Freq: Once | RESPIRATORY_TRACT | Status: AC
  Administered 2011-07-30: 2.5 mg via RESPIRATORY_TRACT
  Filled 2011-07-30: qty 0.5

## 2011-07-30 MED ORDER — CHLORHEXIDINE GLUCONATE 4 % EX LIQD: 30.0000 mL | CUTANEOUS | Status: DC

## 2011-07-30 NOTE — Progress Notes (Signed)
*  PRELIMINARY RESULTS* Pre Operative Dopplers for MVR completed. No evidence of extracranial carotid artery stenosis. Vertebral arteries are patent and flow is antegrade. Palmer arch evaluation -  Doppler waveforms remain norma with radial and ulnar compression on the right. Left Doppler waveforms remained normal with radial compression and reversed with ulnar compression.  Chris Lewis, [place] D 07/30/2011, 10:02 AM

## 2011-07-30 NOTE — Pre-Procedure Instructions (Signed)
20 Chris Lewis  07/30/2011   Your procedure is scheduled on:  Tuesday, December 18th.  Report to [facility] at 5:30AM.  Call this number if you have problems the morning of surgery: [phone]   Remember:   Do not eat food:After Midnight.  May have clear liquids: up to 4 Hours before arrival. 1:30am  Clear liquids include soda, tea, black coffee, apple or grape juice, broth.  Take these medicines the morning of surgery with A SIP OF WATER: Albuertrol Inhaler, Amiondarone, Prednisone, Eye Drops, Spiriva    Stop Multivitamin, Lutein, Vitamin D, Calcium Citrate, Biotin, Aspirin now  Do not wear jewelry, make-up or nail polish.  Do not wear lotions, powders, or perfumes. You may wear deodorant.  Do not shave 48 hours prior to surgery.  Do not bring valuables to the hospital.  Contacts, dentures or bridgework may not be worn into surgery.  Leave suitcase in the car. After surgery it may be brought to your room.  For patients admitted to the hospital, checkout time is 11:00 AM the day of discharge.     Special Instructions: Incentive Spirometry - Practice and bring it with you on the day of surgery. and CHG Shower Use Special Wash: 1/2 bottle night before surgery and 1/2 bottle morning of surgery.   Please read over the following fact sheets that you were given: Pain Booklet, Coughing and Deep Breathing, Blood Transfusion Information, Open Heart Packet and Surgical Site Infection Prevention

## 2011-08-02 ENCOUNTER — Ambulatory Visit (INDEPENDENT_AMBULATORY_CARE_PROVIDER_SITE_OTHER): Payer: BC Managed Care – PPO | Admitting: Thoracic Surgery (Cardiothoracic Vascular Surgery)

## 2011-08-02 ENCOUNTER — Encounter: Payer: Self-pay | Admitting: Thoracic Surgery (Cardiothoracic Vascular Surgery)

## 2011-08-02 VITALS — BP 122/71 | HR 65 | Resp 18 | Ht 69.0 in | Wt 165.0 lb

## 2011-08-02 DIAGNOSIS — I059 Rheumatic mitral valve disease, unspecified: Secondary | ICD-10-CM

## 2011-08-02 DIAGNOSIS — J984 Other disorders of lung: Secondary | ICD-10-CM

## 2011-08-02 MED ORDER — DEXTROSE 5 % IV SOLN
750.0000 mg | INTRAVENOUS | Status: DC
  Filled 2011-08-02: qty 750

## 2011-08-02 MED ORDER — VANCOMYCIN HCL 1000 MG IV SOLR
1250.0000 mg | INTRAVENOUS | Status: AC
  Administered 2011-08-03: 1250 mg via INTRAVENOUS
  Filled 2011-08-02: qty 1250

## 2011-08-02 MED ORDER — POTASSIUM CHLORIDE 2 MEQ/ML IV SOLN
80.0000 meq | INTRAVENOUS | Status: DC
  Filled 2011-08-02: qty 40

## 2011-08-02 MED ORDER — DOPAMINE-DEXTROSE 3.2-5 MG/ML-% IV SOLN
2.0000 ug/kg/min | INTRAVENOUS | Status: DC
  Filled 2011-08-02: qty 250

## 2011-08-02 MED ORDER — METOPROLOL TARTRATE 12.5 MG HALF TABLET
12.5000 mg | ORAL_TABLET | Freq: Once | ORAL | Status: AC
  Administered 2011-08-03: 12.5 mg via ORAL
  Filled 2011-08-02: qty 1

## 2011-08-02 MED ORDER — NITROGLYCERIN IN D5W 200-5 MCG/ML-% IV SOLN
2.0000 ug/min | INTRAVENOUS | Status: AC
  Administered 2011-08-03: 5 ug/min via INTRAVENOUS
  Filled 2011-08-02: qty 250

## 2011-08-02 MED ORDER — SODIUM CHLORIDE 0.9 % IV SOLN
INTRAVENOUS | Status: AC
  Administered 2011-08-03: 1 [IU]/h via INTRAVENOUS
  Filled 2011-08-02: qty 1

## 2011-08-02 MED ORDER — MAGNESIUM SULFATE 50 % IJ SOLN
40.0000 meq | INTRAMUSCULAR | Status: DC
  Filled 2011-08-02: qty 10

## 2011-08-02 MED ORDER — SODIUM CHLORIDE 0.9 % IV SOLN
INTRAVENOUS | Status: DC
  Filled 2011-08-02: qty 40

## 2011-08-02 MED ORDER — DEXTROSE 5 % IV SOLN
1.5000 g | INTRAVENOUS | Status: AC
  Administered 2011-08-03: 1.5 g via INTRAVENOUS
  Administered 2011-08-03: 750 g via INTRAVENOUS
  Filled 2011-08-02: qty 1.5

## 2011-08-02 MED ORDER — PHENYLEPHRINE HCL 10 MG/ML IJ SOLN
30.0000 ug/min | INTRAVENOUS | Status: AC
  Administered 2011-08-03: 15 ug/min via INTRAVENOUS
  Filled 2011-08-02: qty 2

## 2011-08-02 MED ORDER — EPINEPHRINE HCL 1 MG/ML IJ SOLN
0.5000 ug/min | INTRAVENOUS | Status: DC
  Filled 2011-08-02: qty 4

## 2011-08-02 MED ORDER — SODIUM CHLORIDE 0.9 % IV SOLN
0.1000 ug/kg/h | INTRAVENOUS | Status: AC
  Administered 2011-08-03: .2 ug/h via INTRAVENOUS
  Filled 2011-08-02: qty 4

## 2011-08-02 MED ORDER — PLASMA-LYTE 148 IV SOLN
INTRAVENOUS | Status: AC
  Administered 2011-08-03: 08:00:00
  Filled 2011-08-02: qty 0.5

## 2011-08-02 NOTE — Progress Notes (Signed)
[address]            [place] [address]          [phone]     CARDIOTHORACIC SURGERY OFFICE NOTE  Referring Provider is Armanda Magic, MD PCP is Lillia Mountain, MD, MD   HPI:  Patient returns for follow-up with plans to proceed with elective mitral valve repair tomorrow morning. He reports no new problems since he was last seen in the office and he is eager to proceed with surgery. He has tolerated amiodarone without significant side effect over the past week. He has no new polyps or complaints. His review of systems is unchanged from previously.   Current Outpatient Prescriptions  Medication Sig Dispense Refill  . albuterol (VENTOLIN HFA) 108 (90 BASE) MCG/ACT inhaler Inhale 2 puffs into the lungs every 4 (four) hours as needed for wheezing.  1 Inhaler  6  . amiodarone (PACERONE) 200 MG tablet Take 1 tablet (200 mg total) by mouth 2 (two) times daily. Begin 7 days prior to surgery.  14 tablet  0  . aspirin 81 MG tablet Take 81 mg by mouth daily.        . Biotin 1000 MCG tablet Take 1,000 mcg by mouth daily.        . calcium carbonate (TUMS EX) 750 MG chewable tablet Chew 1 tablet by mouth as needed. For gas and reflux.      . Calcium Citrate-Vitamin D (CALCIUM CITRATE + PO) Take 1 tablet by mouth daily.        . Cholecalciferol (VITAMIN D3) 1000 UNITS CAPS Take 1,000 Units by mouth 2 (two) times daily.       . clonazePAM (KLONOPIN) 0.5 MG tablet Take 0.5 mg by mouth at bedtime as needed. For sleep.      Marland Kitchen doxycycline (VIBRAMYCIN) 100 MG capsule       . metroNIDAZOLE (METROGEL) 1 % gel Apply 1 application topically daily.       . Multiple Vitamins-Minerals (MULTIVITAMINS THER. W/MINERALS) TABS Take 1 tablet by mouth daily.        Bertram Gala Glycol-Propyl Glycol (SYSTANE OP) Apply 1 drop to eye 2 (two) times daily.        . predniSONE (DELTASONE) 5 MG tablet Take 5 mg by mouth daily.       . simvastatin (ZOCOR) 40 MG tablet Take 40 mg by  mouth daily.        Marland Kitchen tiotropium (SPIRIVA) 18 MCG inhalation capsule Place 1 capsule (18 mcg total) into inhaler and inhale daily.  30 capsule  6  . Lutein 6 MG CAPS Take 1 capsule by mouth daily.        No current facility-administered medications for this visit.   Facility-Administered Medications Ordered in Other Visits  Medication Dose Route Frequency Provider Last Rate Last Dose  . aminocaproic acid (AMICAR) 10 g in sodium chloride 0.9 % 100 mL infusion   Intravenous To OR Crystal Stillinger Robertson, PHARMD      . cefUROXime (ZINACEF) 1.5 g in dextrose 5 % 50 mL IVPB  1.5 g Intravenous To OR Crystal [employer], PHARMD      . cefUROXime (ZINACEF) 750 mg in dextrose 5 % 50 mL IVPB  750 mg Intravenous To OR Crystal Stillinger Robertson, PHARMD      . chlorhexidine (HIBICLENS) 4 % liquid 2 application  30 mL Topical  UD Purcell Nails, MD      . dexmedetomidine [facility] 400 mcg in sodium chloride 0.9 % 100 mL infusion  0.1-0.7 mcg/kg/hr Intravenous To OR Crystal Salomon Fick, PHARMD      . DOPamine (INTROPIN) 800 mg in dextrose 5 % 250 mL infusion  2-20 mcg/kg/min Intravenous To OR Crystal Stillinger Robertson, PHARMD      . EPINEPHrine (ADRENALIN) 4,000 mcg in dextrose 5 % 250 mL infusion  0.5-20 mcg/min Intravenous To OR Crystal Salomon Fick, PHARMD      . heparin 2,500 Units, papaverine 30 mg in electrolyte-148 (PLASMALYTE-148) 500 mL irrigation   Irrigation To OR Crystal Stillinger Robertson, PHARMD      . insulin regular (NOVOLIN R,HUMULIN R) 1 Units/mL in sodium chloride 0.9 % 100 mL infusion   Intravenous To OR Crystal [employer], PHARMD      . magnesium sulfate (IV Push/IM) injection 40 mEq  40 mEq Other To OR [employer], PHARMD      . metoprolol tartrate (LOPRESSOR) tablet 12.5 mg  12.5 mg Oral Once Purcell Nails, MD      . nitroGLYCERIN 0.2 mg/mL in dextrose 5 % infusion  2-200 mcg/min Intravenous To OR Crystal [employer], PHARMD      . phenylephrine (NEO-SYNEPHRINE) 20,000 mcg in dextrose 5 % 250 mL infusion  30-200 mcg/min Intravenous To OR Crystal [employer], PHARMD      . potassium chloride injection 80 mEq  80 mEq Other To OR [employer], PHARMD      . vancomycin (VANCOCIN) 1,250 mg in sodium chloride 0.9 % 250 mL IVPB  1,250 mg Intravenous To OR Crystal Salomon Fick, PHARMD          Physical Exam:   BP 122/71  Pulse 65  Resp 18  Ht 5\' 9"  (1.753 m)  Wt 165 lb (74.844 kg)  BMI 24.37 kg/m2  HEENT:  Unremarkable  Chest:   Clear to auscultation  CV:   Regular rate and rhythm with prominent systolic murmur  Abdomen:  Soft and nontender  Extremities:  Warm and well-perfused  Diagnostic Tests:  Pulmonary function tests performed 07/30/2011 are reviewed. These demonstrate severe chronic obstructive pulmonary disease with baseline FEV1 measured 1.42 L or 46% predicted. This increased slightly to 1.58 L with bronchodilator therapy. Forced vital capacity was 2.91 L. The FEF 25-75 was 0.57 L or 25% predicted. There was evidence for hyperinflation. Diffusion capacity was 56% predicted.   Impression:  Mitral valve prolapse with severe mitral regurgitation associated with mild exertional shortness of breath and occasional episodes of paroxysmal nocturnal dyspnea. The patient also has severe chronic obstructive pulmonary disease.  He quit smoking many years ago.  Plan:  We plan to proceed with elective mitral valve repair via right mini thoracotomy approach tomorrow.  The rationale for elective mitral valve repair surgery has been explained, including a comparison between surgery and continued medical therapy with close follow-up.  The likelihood of successful and durable valve repair has been discussed with particular reference to the findings of their recent echocardiogram.  Based upon these findings and previous experience, I have quoted them a greater than 90  percent likelihood of successful valve repair.  In the unlikely event that their valve cannot be successfully repaired, we discussed the possibility of replacing the mitral valve using a mechanical prosthesis with the attendant need for long-term anticoagulation versus the alternative of replacing it using a bioprosthetic tissue valve with its potential for late structural valve  deterioration and failure, depending upon the patient's longevity.  The patient specifically requests that if the mitral valve must be replaced that it be done using a bioprosthetic tissue valve.  Alternative surgical approaches have been discussed, including a comparison between conventional sternotomy and minimally-invasive techniques.  The relative risks and benefits of each have been reviewed as they pertain to the patient's specific circumstances, and all of their questions have been addressed.   Salvatore Decent. Cornelius Moras, MD 08/02/2011 4:19 PM

## 2011-08-02 NOTE — H&P (Signed)
PCP is Chris Mountain, MD, MD Referring Provider is Chris Reichert, MD    Chief Complaint   Patient presents with   .  Mitral Regurgitation     HPI:  Patient is a 70 year old gentleman from [place] with long-standing history of mitral valve prolapse and mitral regurgitation. Most recently the patient has been followed by Dr. Armanda Lewis with serial echocardiograms. Recent echocardiogram performed as an outpatient confirmed the presence of mitral valve prolapse with severe mitral regurgitation.  Elective cardiothoracic surgical consultation has been requested to consider mitral valve repair.     Past Medical History   Diagnosis  Date   .  Achalasia     .  Hypercholesterolemia     .  Chronic obstructive pulmonary disease     .  Anxiety     .  Seasonal allergic rhinitis     .  Transient ischemic attack     .  Mitral valve regurgitation     .  Osteopenia     .  FH: cholecystectomy     .  S/P right inguinal herniorrhaphy     .  COPD (chronic obstructive pulmonary disease)         Past Surgical History   Procedure  Date   .  Back surgery         x 3   .  Foot surgery     .  Esophagogastroduodenoscopy and savary esophageal dilation.         x9   .  Eyebrow surgery  2012   .  Rt inguinal herniorrhaphy  1969/1997   .  Cholecystectomy     .  Esophagoscopy w/ botox injection         x9 beginning 2008, most recent Sept. 2012       Family History   Problem  Relation  Age of Onset   .  Stroke  Father  37       cva   .  Scleroderma  Mother  24   .  Lung cancer  Sister     .  Heart disease  Sister       Social History History   Substance Use Topics   .  Smoking status:  Former Smoker       Quit date:  08/16/1997   .  Smokeless tobacco:  Never Used   .  Alcohol Use:  No       Current Outpatient Prescriptions   Medication  Sig  Dispense  Refill   .  albuterol-ipratropium (COMBIVENT) 18-103 MCG/ACT inhaler  Inhale 2 puffs into the lungs every 6 (six) hours as  needed.           Marland Kitchen  aspirin 81 MG tablet  Take 81 mg by mouth daily.           .  beta carotene w/minerals (OCUVITE) tablet  Take 1 tablet by mouth daily.           .  calcium carbonate 200 MG capsule  Take 250 mg by mouth 2 (two) times daily with a meal.           .  clonazePAM (KLONOPIN) 0.5 MG tablet  Take 0.5 mg by mouth 2 (two) times daily as needed.           Marland Kitchen  dextromethorphan-guaiFENesin (MUCINEX DM) 30-600 MG per 12 hr tablet  Take 1 tablet by mouth every 12 (twelve) hours.           Marland Kitchen  doxycycline (VIBRA-TABS) 100 MG tablet  Take 100 mg by mouth daily.          .  metroNIDAZOLE (METROGEL) 0.75 % gel  Apply topically 2 (two) times daily.           .  multivitamin-lutein (OCUVITE-LUTEIN) CAPS  Take 1 capsule by mouth daily.           .  predniSONE (DELTASONE) 20 MG tablet  5 mg. Tapering down 2 1/2 tablets po every day         .  tiotropium (SPIRIVA) 18 MCG inhalation capsule  Place 18 mcg into inhaler and inhale daily.           .  VENTOLIN HFA 108 (90 BASE) MCG/ACT inhaler  USE 1-2 PUFFS EVERY 4 TO 6 HOURS AS NEEDED   1 Inhaler   6       Allergies   Allergen  Reactions   .  Atorvastatin         REACTION: muscle pain   .  Contrast Media (Iodinated Diagnostic Agents)     .  Rosuvastatin         REACTION: muscle weakness     Review of Systems  Constitutional: Positive for unexpected weight change.        The patient reports losing a few pounds in weight recently because he waited so long to have his achalasia treated.  HENT: Negative.   Eyes: Negative.   Respiratory: Positive for cough and shortness of breath.   Cardiovascular: Negative for chest pain, palpitations and leg swelling.        The patient reports mild exertional shortness of breath which occurs only with relatively strenuous physical activity.  However, the patient also reports occasional episodes of PND.  He denies any chest pain, resting SOB, orthopnea, palpitations, syncope, or lower extremity edema.     Gastrointestinal:        The patient has longstanding symptoms of difficulty swallowing some types of solid foods which has partially improved following recent EGD with Botox injection.  Genitourinary: Positive for frequency.  Musculoskeletal: Positive for myalgias.  Neurological: Negative.   Hematological: Negative.   Psychiatric/Behavioral: Negative.     BP 129/80  Pulse 90  Resp 20  Ht 5\' 9"  (1.753 m)  Wt 165 lb (74.844 kg)  BMI 24.37 kg/m2  SpO2 95% Physical Exam  Vitals reviewed. Constitutional: He is oriented to person, place, and time. He appears well-developed and well-nourished.  HENT:   Head: Normocephalic.  Eyes: Pupils are equal, round, and reactive to light.  Neck: Normal range of motion. Neck supple. No JVD present.  Cardiovascular: Normal rate and regular rhythm.    Murmur heard.      Prominent grade IV/VI holosystolic murmur heard best at the apex with radiation all across the precordium and to the back.  Pulmonary/Chest: Effort normal and breath sounds normal. He has no wheezes. He has no rales.  Abdominal: Soft. Bowel sounds are normal.  Musculoskeletal: Normal range of motion. He exhibits no edema.  Lymphadenopathy:    He has no cervical adenopathy.  Neurological: He is alert and oriented to person, place, and time.       Grossly non-focal  Skin: Skin is warm and dry.  Psychiatric: He has a normal mood and affect. His behavior is normal. Judgment and thought content normal.     Diagnostic Tests:  Transthoracic echocardiogram performed at [facility] cardiology on 04/29/2011 is reviewed and compared with transthoracic echocardiogram performed  at [facility] on 05/18/2011. Both reveal the presence of at least moderate mitral regurgitation with the jet of regurgitation that is eccentric and directed anteriorly around the left atrium. There appears to be likely prolapse of the posterior leaflet, although the leaflet is not well visualized. Left ventricular size  and function are normal. No other significant abnormalities are noted. There is mild left atrial enlargement. There is trace to mild tricuspid regurgitation.  Left heart catheterization performed 05/18/2011 is reviewed. This reveals normal coronary artery anatomy with no significant coronary artery disease. Right heart pressure data are not available. Left ventricular function appears normal. There is mitral regurgitation appreciated, although probably not 4+.  Upper GI barium swallow performed October 5 is reviewed. The esophagus is only mildly dilated. There is classical bird beak appearance of the GE junction consistent with long-standing achalasia. There does not appear to be any sort of epiphrenic diverticulum or significant other distal esophageal pathology. Contrast does pass into the stomach although delayed.  CT angiogram of the chest abdomen and pelvis performed 06/04/2011 is reviewed. This demonstrates mild atherosclerotic disease afflicting the descending thoracic and abdominal aorta. There is some eccentric plaque located in the descending abdominal aorta below the renal arteries. This is fairly mild and there is minimal associated calcification. There are no flow limiting lesions noted in there are no significant intraluminal filling defects appreciated. There was a very small 4 mm benign-appearing nodule appreciated in the right upper lobe. This does not appear very suspicious that all.  Transesophageal echocardiogram performed 07/06/2011 is reviewed. This confirmed the presence of mitral valve prolapse with a flail segment of the middle scallop of the posterior leaflet and severe mitral regurgitation. There are no other complicating features. 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 There should be a high likelihood for successful valve repair.  Pulmonary function tests performed 07/30/2011 are reviewed. These demonstrate severe chronic  obstructive pulmonary disease with baseline FEV1 measured 1.42 L or 46% predicted. This increased slightly to 1.58 L with bronchodilator therapy. Forced vital capacity was 2.91 L. The FEF 25-75 was 0.57 L or 25% predicted. There was evidence for hyperinflation. Diffusion capacity was 56% predicted.   Impression:  Mitral valve prolapse with severe mitral regurgitation associated with mild exertional shortness of breath and occasional episodes of paroxysmal nocturnal dyspnea. The patient also has severe chronic obstructive pulmonary disease.  He quit smoking many years ago.  Plan:  We plan to proceed with elective mitral valve repair via right mini thoracotomy approach tomorrow.  The rationale for elective mitral valve repair surgery has been explained, including a comparison between surgery and continued medical therapy with close follow-up.  The likelihood of successful and durable valve repair has been discussed with particular reference to the findings of their recent echocardiogram.  Based upon these findings and previous experience, I have quoted them a greater than 90 percent likelihood of successful valve repair.  In the unlikely event that their valve cannot be successfully repaired, we discussed the possibility of replacing the mitral valve using a mechanical prosthesis with the attendant need for long-term anticoagulation versus the alternative of replacing it using a bioprosthetic tissue valve with its potential for late structural valve deterioration and failure, depending upon the patient's longevity.  The patient specifically requests that if the mitral valve must be replaced that it be done using a bioprosthetic tissue valve.  Alternative surgical approaches have been discussed, including a comparison between conventional sternotomy and minimally-invasive  techniques.  The relative risks and benefits of each have been reviewed as they pertain to the patient's specific circumstances, and all of  their questions have been addressed.   Salvatore Decent. Cornelius Moras, MD 08/02/2011 5:34 PM

## 2011-08-03 ENCOUNTER — Encounter (HOSPITAL_COMMUNITY): Payer: Self-pay | Admitting: Anesthesiology

## 2011-08-03 ENCOUNTER — Inpatient Hospital Stay (HOSPITAL_COMMUNITY): Payer: BC Managed Care – PPO | Admitting: Anesthesiology

## 2011-08-03 ENCOUNTER — Encounter (HOSPITAL_COMMUNITY)
Admission: RE | Disposition: A | Discharge status: Home / Self Care | Payer: Self-pay | Source: Ambulatory Visit | Attending: Thoracic Surgery (Cardiothoracic Vascular Surgery)

## 2011-08-03 ENCOUNTER — Other Ambulatory Visit: Payer: Self-pay

## 2011-08-03 ENCOUNTER — Encounter (HOSPITAL_COMMUNITY): Payer: Self-pay | Admitting: Thoracic Surgery (Cardiothoracic Vascular Surgery)

## 2011-08-03 ENCOUNTER — Inpatient Hospital Stay (HOSPITAL_COMMUNITY)
Admission: RE | Admit: 2011-08-03 | Discharge: 2011-08-09 | DRG: 105 | Disposition: A | Discharge status: Home / Self Care | Payer: BC Managed Care – PPO | Source: Ambulatory Visit | Attending: Thoracic Surgery (Cardiothoracic Vascular Surgery) | Admitting: Thoracic Surgery (Cardiothoracic Vascular Surgery)

## 2011-08-03 ENCOUNTER — Encounter (HOSPITAL_COMMUNITY): Payer: Self-pay | Admitting: *Deleted

## 2011-08-03 ENCOUNTER — Other Ambulatory Visit: Payer: Self-pay | Admitting: Thoracic Surgery (Cardiothoracic Vascular Surgery)

## 2011-08-03 ENCOUNTER — Inpatient Hospital Stay (HOSPITAL_COMMUNITY): Payer: BC Managed Care – PPO

## 2011-08-03 DIAGNOSIS — D62 Acute posthemorrhagic anemia: Secondary | ICD-10-CM | POA: Diagnosis not present

## 2011-08-03 DIAGNOSIS — I059 Rheumatic mitral valve disease, unspecified: Principal | ICD-10-CM | POA: Diagnosis present

## 2011-08-03 DIAGNOSIS — F329 Major depressive disorder, single episode, unspecified: Secondary | ICD-10-CM | POA: Diagnosis present

## 2011-08-03 DIAGNOSIS — F411 Generalized anxiety disorder: Secondary | ICD-10-CM | POA: Diagnosis present

## 2011-08-03 DIAGNOSIS — J449 Chronic obstructive pulmonary disease, unspecified: Secondary | ICD-10-CM

## 2011-08-03 DIAGNOSIS — R911 Solitary pulmonary nodule: Secondary | ICD-10-CM | POA: Diagnosis present

## 2011-08-03 DIAGNOSIS — E8779 Other fluid overload: Secondary | ICD-10-CM | POA: Diagnosis not present

## 2011-08-03 DIAGNOSIS — Z87891 Personal history of nicotine dependence: Secondary | ICD-10-CM

## 2011-08-03 DIAGNOSIS — J309 Allergic rhinitis, unspecified: Secondary | ICD-10-CM | POA: Diagnosis present

## 2011-08-03 DIAGNOSIS — K220 Achalasia of cardia: Secondary | ICD-10-CM | POA: Diagnosis present

## 2011-08-03 DIAGNOSIS — I10 Essential (primary) hypertension: Secondary | ICD-10-CM | POA: Diagnosis present

## 2011-08-03 DIAGNOSIS — Z8673 Personal history of transient ischemic attack (TIA), and cerebral infarction without residual deficits: Secondary | ICD-10-CM

## 2011-08-03 DIAGNOSIS — J4489 Other specified chronic obstructive pulmonary disease: Secondary | ICD-10-CM | POA: Diagnosis present

## 2011-08-03 DIAGNOSIS — K219 Gastro-esophageal reflux disease without esophagitis: Secondary | ICD-10-CM | POA: Diagnosis present

## 2011-08-03 DIAGNOSIS — E785 Hyperlipidemia, unspecified: Secondary | ICD-10-CM | POA: Diagnosis present

## 2011-08-03 DIAGNOSIS — F3289 Other specified depressive episodes: Secondary | ICD-10-CM | POA: Diagnosis present

## 2011-08-03 HISTORY — PX: MITRAL VALVE REPAIR: SHX2039

## 2011-08-03 LAB — POCT I-STAT 3, ART BLOOD GAS (G3+)
Acid-base deficit: 4 mmol/L — ABNORMAL HIGH (ref 0.0–2.0)
Acid-base deficit: 3 mmol/L — ABNORMAL HIGH (ref 0.0–2.0)
Bicarbonate: 22.4 mEq/L (ref 20.0–24.0)
Bicarbonate: 24.9 mEq/L — ABNORMAL HIGH (ref 20.0–24.0)
Bicarbonate: 24.8 mEq/L — ABNORMAL HIGH (ref 20.0–24.0)
Bicarbonate: 23.7 mEq/L (ref 20.0–24.0)
O2 Saturation: 100 %
O2 Saturation: 91 %
O2 Saturation: 96 %
O2 Saturation: 93 %
Patient temperature: 33.9
Patient temperature: 36.3
TCO2: 24 mmol/L (ref 0–100)
TCO2: 27 mmol/L (ref 0–100)
TCO2: 26 mmol/L (ref 0–100)
TCO2: 25 mmol/L (ref 0–100)
pCO2 arterial: 41.9 mmHg (ref 35.0–45.0)
pCO2 arterial: 43.3 mmHg (ref 35.0–45.0)
pH, Arterial: 7.308 — ABNORMAL LOW (ref 7.350–7.450)
pH, Arterial: 7.377 (ref 7.350–7.450)
pH, Arterial: 7.342 — ABNORMAL LOW (ref 7.350–7.450)

## 2011-08-03 LAB — CBC
Hemoglobin: 11.7 g/dL — ABNORMAL LOW (ref 13.0–17.0)
MCH: 30.8 pg (ref 26.0–34.0)
MCH: 30.2 pg (ref 26.0–34.0)
MCHC: 33.9 g/dL (ref 30.0–36.0)
MCHC: 33.4 g/dL (ref 30.0–36.0)
Platelets: 123 10*3/uL — ABNORMAL LOW (ref 150–400)
RDW: 13.1 % (ref 11.5–15.5)

## 2011-08-03 LAB — POCT I-STAT 4, (NA,K, GLUC, HGB,HCT)
Glucose, Bld: 91 mg/dL (ref 70–99)
Glucose, Bld: 132 mg/dL — ABNORMAL HIGH (ref 70–99)
Glucose, Bld: 150 mg/dL — ABNORMAL HIGH (ref 70–99)
Glucose, Bld: 109 mg/dL — ABNORMAL HIGH (ref 70–99)
HCT: 32 % — ABNORMAL LOW (ref 39.0–52.0)
HCT: 32 % — ABNORMAL LOW (ref 39.0–52.0)
Hemoglobin: 13.6 g/dL (ref 13.0–17.0)
Hemoglobin: 13.6 g/dL (ref 13.0–17.0)
Hemoglobin: 10.9 g/dL — ABNORMAL LOW (ref 13.0–17.0)
Hemoglobin: 10.9 g/dL — ABNORMAL LOW (ref 13.0–17.0)
Potassium: 3.5 mEq/L (ref 3.5–5.1)
Potassium: 3.8 mEq/L (ref 3.5–5.1)
Potassium: 3 mEq/L — ABNORMAL LOW (ref 3.5–5.1)
Sodium: 143 mEq/L (ref 135–145)
Sodium: 143 mEq/L (ref 135–145)
Sodium: 140 mEq/L (ref 135–145)
Sodium: 143 mEq/L (ref 135–145)

## 2011-08-03 LAB — PROTIME-INR
INR: 1.36 (ref 0.00–1.49)
Prothrombin Time: 17 seconds — ABNORMAL HIGH (ref 11.6–15.2)

## 2011-08-03 LAB — POCT I-STAT, CHEM 8
BUN: 11 mg/dL (ref 6–23)
Creatinine, Ser: 0.9 mg/dL (ref 0.50–1.35)
Glucose, Bld: 176 mg/dL — ABNORMAL HIGH (ref 70–99)
Potassium: 4.3 mEq/L (ref 3.5–5.1)
Sodium: 142 mEq/L (ref 135–145)

## 2011-08-03 LAB — GLUCOSE, CAPILLARY
Glucose-Capillary: 102 mg/dL — ABNORMAL HIGH (ref 70–99)
Glucose-Capillary: 145 mg/dL — ABNORMAL HIGH (ref 70–99)
Glucose-Capillary: 153 mg/dL — ABNORMAL HIGH (ref 70–99)
Glucose-Capillary: 177 mg/dL — ABNORMAL HIGH (ref 70–99)
Glucose-Capillary: 99 mg/dL (ref 70–99)

## 2011-08-03 LAB — CREATININE, SERUM: Creatinine, Ser: 0.86 mg/dL (ref 0.50–1.35)

## 2011-08-03 LAB — MAGNESIUM: Magnesium: 2.7 mg/dL — ABNORMAL HIGH (ref 1.5–2.5)

## 2011-08-03 LAB — PLATELET COUNT: Platelets: 85 10*3/uL — ABNORMAL LOW (ref 150–400)

## 2011-08-03 SURGERY — REPAIR, MITRAL VALVE, MINIMALLY INVASIVE
Anesthesia: General | Site: Chest | Laterality: Right | Wound class: Clean

## 2011-08-03 MED ORDER — DOCUSATE SODIUM 100 MG PO CAPS
200.0000 mg | ORAL_CAPSULE | Freq: Every day | ORAL | Status: DC
  Administered 2011-08-04: 200 mg via ORAL
  Filled 2011-08-03: qty 2

## 2011-08-03 MED ORDER — VECURONIUM BROMIDE 10 MG IV SOLR
INTRAVENOUS | Status: DC | PRN
  Administered 2011-08-03: 10 mg via INTRAVENOUS
  Administered 2011-08-03 (×2): 5 mg via INTRAVENOUS

## 2011-08-03 MED ORDER — PROPOFOL 10 MG/ML IV BOLUS
INTRAVENOUS | Status: DC | PRN
  Administered 2011-08-03: 50 mg via INTRAVENOUS

## 2011-08-03 MED ORDER — ALBUMIN HUMAN 5 % IV SOLN
250.0000 mL | INTRAVENOUS | Status: AC | PRN
  Administered 2011-08-04: 250 mL via INTRAVENOUS

## 2011-08-03 MED ORDER — PANTOPRAZOLE SODIUM 40 MG PO TBEC: 40.0000 mg | DELAYED_RELEASE_TABLET | Freq: Every day | ORAL | Status: DC

## 2011-08-03 MED ORDER — MORPHINE SULFATE 4 MG/ML IJ SOLN
2.0000 mg | INTRAMUSCULAR | Status: DC | PRN
  Administered 2011-08-03: 4 mg via INTRAVENOUS
  Filled 2011-08-03: qty 1

## 2011-08-03 MED ORDER — SODIUM CHLORIDE 0.9 % IV SOLN: INTRAVENOUS | Status: DC

## 2011-08-03 MED ORDER — METOPROLOL TARTRATE 12.5 MG HALF TABLET
12.5000 mg | ORAL_TABLET | Freq: Two times a day (BID) | ORAL | Status: DC
  Administered 2011-08-05 – 2011-08-08 (×7): 12.5 mg via ORAL
  Filled 2011-08-03 (×13): qty 1

## 2011-08-03 MED ORDER — SIMVASTATIN 40 MG PO TABS
40.0000 mg | ORAL_TABLET | Freq: Every day | ORAL | Status: DC
  Administered 2011-08-04 – 2011-08-08 (×5): 40 mg via ORAL
  Filled 2011-08-03 (×7): qty 1

## 2011-08-03 MED ORDER — MIDAZOLAM HCL 2 MG/2ML IJ SOLN: 2.0000 mg | INTRAMUSCULAR | Status: DC | PRN

## 2011-08-03 MED ORDER — HYDROCORTISONE SOD SUCCINATE 100 MG IJ SOLR
100.0000 mg | Freq: Three times a day (TID) | INTRAMUSCULAR | Status: AC
  Administered 2011-08-03 – 2011-08-04 (×3): 100 mg via INTRAVENOUS
  Filled 2011-08-03 (×3): qty 2

## 2011-08-03 MED ORDER — 0.9 % SODIUM CHLORIDE (POUR BTL) OPTIME
TOPICAL | Status: DC | PRN
  Administered 2011-08-03: 6000 mL

## 2011-08-03 MED ORDER — ROCURONIUM BROMIDE 100 MG/10ML IV SOLN
INTRAVENOUS | Status: DC | PRN
  Administered 2011-08-03: 50 mg via INTRAVENOUS

## 2011-08-03 MED ORDER — POTASSIUM CHLORIDE 10 MEQ/50ML IV SOLN
10.0000 meq | INTRAVENOUS | Status: AC
  Administered 2011-08-03 (×3): 10 meq via INTRAVENOUS

## 2011-08-03 MED ORDER — SODIUM CHLORIDE 0.9 % IR SOLN
Status: DC | PRN
  Administered 2011-08-03: 9000 mL

## 2011-08-03 MED ORDER — INSULIN ASPART 100 UNIT/ML ~~LOC~~ SOLN
0.0000 [IU] | SUBCUTANEOUS | Status: AC
  Administered 2011-08-03: 4 [IU] via SUBCUTANEOUS
  Administered 2011-08-03: 2 [IU] via SUBCUTANEOUS
  Filled 2011-08-03: qty 3

## 2011-08-03 MED ORDER — CALCIUM CHLORIDE 10 % IV SOLN
1.0000 g | Freq: Once | INTRAVENOUS | Status: AC | PRN
  Filled 2011-08-03: qty 10

## 2011-08-03 MED ORDER — METOPROLOL TARTRATE 25 MG/10 ML ORAL SUSPENSION
12.5000 mg | Freq: Two times a day (BID) | ORAL | Status: DC
  Administered 2011-08-04: 12.5 mg
  Filled 2011-08-03 (×13): qty 5

## 2011-08-03 MED ORDER — VANCOMYCIN HCL 1000 MG IV SOLR
1000.0000 mg | Freq: Once | INTRAVENOUS | Status: AC
  Administered 2011-08-03: 1000 mg via INTRAVENOUS
  Filled 2011-08-03: qty 1000

## 2011-08-03 MED ORDER — ASPIRIN 81 MG PO CHEW: 324.0000 mg | CHEWABLE_TABLET | Freq: Every day | ORAL | Status: DC

## 2011-08-03 MED ORDER — LACTATED RINGERS IV SOLN: INTRAVENOUS | Status: DC

## 2011-08-03 MED ORDER — POTASSIUM CHLORIDE 10 MEQ/50ML IV SOLN
10.0000 meq | INTRAVENOUS | Status: AC
  Administered 2011-08-03 (×2): 10 meq via INTRAVENOUS

## 2011-08-03 MED ORDER — SODIUM CHLORIDE 0.9 % IR SOLN
Status: DC | PRN
  Administered 2011-08-03: 1000 mL

## 2011-08-03 MED ORDER — SODIUM CHLORIDE 0.9 % IV SOLN
5.0000 g | Freq: Once | INTRAVENOUS | Status: DC
  Filled 2011-08-03: qty 20

## 2011-08-03 MED ORDER — DOXYCYCLINE HYCLATE 100 MG PO TABS
100.0000 mg | ORAL_TABLET | Freq: Every day | ORAL | Status: DC
  Administered 2011-08-04: 100 mg via ORAL
  Filled 2011-08-03: qty 1

## 2011-08-03 MED ORDER — PHENYLEPHRINE HCL 10 MG/ML IJ SOLN
0.0000 ug/min | INTRAVENOUS | Status: DC
  Administered 2011-08-03: 15 ug/min via INTRAVENOUS
  Filled 2011-08-03 (×2): qty 2

## 2011-08-03 MED ORDER — BISACODYL 10 MG RE SUPP: 10.0000 mg | Freq: Every day | RECTAL | Status: DC

## 2011-08-03 MED ORDER — ONDANSETRON HCL 4 MG/2ML IJ SOLN
4.0000 mg | Freq: Four times a day (QID) | INTRAMUSCULAR | Status: DC | PRN
  Administered 2011-08-04 – 2011-08-05 (×2): 4 mg via INTRAVENOUS
  Filled 2011-08-03 (×2): qty 2

## 2011-08-03 MED ORDER — MORPHINE SULFATE 2 MG/ML IJ SOLN: 1.0000 mg | INTRAMUSCULAR | Status: AC | PRN

## 2011-08-03 MED ORDER — MIDAZOLAM HCL 5 MG/5ML IJ SOLN
INTRAMUSCULAR | Status: DC | PRN
  Administered 2011-08-03 (×5): 2 mg via INTRAVENOUS

## 2011-08-03 MED ORDER — OXYCODONE HCL 5 MG PO TABS
5.0000 mg | ORAL_TABLET | ORAL | Status: DC | PRN
  Administered 2011-08-04 – 2011-08-05 (×2): 5 mg via ORAL
  Filled 2011-08-03 (×2): qty 1

## 2011-08-03 MED ORDER — HYDROCORTISONE SOD SUCCINATE 100 MG PF FOR IT USE
INTRAMUSCULAR | Status: DC | PRN
  Administered 2011-08-03 (×2): 125 mg via INTRATHECAL

## 2011-08-03 MED ORDER — MAGNESIUM SULFATE 40 MG/ML IJ SOLN
4.0000 g | Freq: Once | INTRAMUSCULAR | Status: AC
  Administered 2011-08-03: 4 g via INTRAVENOUS
  Filled 2011-08-03: qty 100

## 2011-08-03 MED ORDER — INSULIN ASPART 100 UNIT/ML ~~LOC~~ SOLN
0.0000 [IU] | SUBCUTANEOUS | Status: DC
  Administered 2011-08-04 (×2): 2 [IU] via SUBCUTANEOUS
  Administered 2011-08-04: 4 [IU] via SUBCUTANEOUS
  Administered 2011-08-04 – 2011-08-05 (×3): 2 [IU] via SUBCUTANEOUS

## 2011-08-03 MED ORDER — NITROGLYCERIN IN D5W 200-5 MCG/ML-% IV SOLN: 0.0000 ug/min | INTRAVENOUS | Status: DC

## 2011-08-03 MED ORDER — DEXMEDETOMIDINE HCL 100 MCG/ML IV SOLN
0.1000 ug/kg/h | INTRAVENOUS | Status: DC
  Filled 2011-08-03: qty 2

## 2011-08-03 MED ORDER — FENTANYL CITRATE 0.05 MG/ML IJ SOLN
INTRAMUSCULAR | Status: DC | PRN
  Administered 2011-08-03: 700 ug via INTRAVENOUS
  Administered 2011-08-03: 100 ug via INTRAVENOUS
  Administered 2011-08-03: 250 ug via INTRAVENOUS
  Administered 2011-08-03: 150 ug via INTRAVENOUS
  Administered 2011-08-03: 50 ug via INTRAVENOUS

## 2011-08-03 MED ORDER — TIOTROPIUM BROMIDE MONOHYDRATE 18 MCG IN CAPS
18.0000 ug | ORAL_CAPSULE | Freq: Every day | RESPIRATORY_TRACT | Status: DC
  Administered 2011-08-04 – 2011-08-09 (×6): 18 ug via RESPIRATORY_TRACT
  Filled 2011-08-03 (×2): qty 5

## 2011-08-03 MED ORDER — ACETAMINOPHEN 160 MG/5ML PO SOLN
975.0000 mg | Freq: Four times a day (QID) | ORAL | Status: AC
  Filled 2011-08-03: qty 40.6

## 2011-08-03 MED ORDER — HEPARIN SODIUM (PORCINE) 1000 UNIT/ML IJ SOLN
INTRAMUSCULAR | Status: DC | PRN
  Administered 2011-08-03: 26000 [IU] via INTRAVENOUS

## 2011-08-03 MED ORDER — BISACODYL 5 MG PO TBEC
10.0000 mg | DELAYED_RELEASE_TABLET | Freq: Every day | ORAL | Status: DC
  Administered 2011-08-04 – 2011-08-05 (×2): 10 mg via ORAL
  Filled 2011-08-03 (×2): qty 2

## 2011-08-03 MED ORDER — DEXTROSE 5 % IV SOLN
1.5000 g | Freq: Two times a day (BID) | INTRAVENOUS | Status: DC
  Administered 2011-08-03 – 2011-08-04 (×3): 1.5 g via INTRAVENOUS
  Filled 2011-08-03 (×4): qty 1.5

## 2011-08-03 MED ORDER — SODIUM CHLORIDE 0.9 % IJ SOLN: 3.0000 mL | INTRAMUSCULAR | Status: DC | PRN

## 2011-08-03 MED ORDER — SODIUM CHLORIDE 0.9 % IV SOLN
10.0000 g | INTRAVENOUS | Status: DC | PRN
  Administered 2011-08-03: 5 g/h via INTRAVENOUS
  Administered 2011-08-03: 13:00:00 via INTRAVENOUS

## 2011-08-03 MED ORDER — SODIUM CHLORIDE 0.9 % IV SOLN
INTRAVENOUS | Status: DC
  Filled 2011-08-03: qty 1

## 2011-08-03 MED ORDER — GLYCOPYRROLATE 0.2 MG/ML IJ SOLN
INTRAMUSCULAR | Status: DC | PRN
  Administered 2011-08-03: 0.2 mg via INTRAVENOUS

## 2011-08-03 MED ORDER — SODIUM CHLORIDE 0.9 % IJ SOLN
3.0000 mL | Freq: Two times a day (BID) | INTRAMUSCULAR | Status: DC
  Administered 2011-08-04 (×2): 3 mL via INTRAVENOUS

## 2011-08-03 MED ORDER — BUPIVACAINE 0.5 % ON-Q PUMP SINGLE CATH 400 ML
400.0000 mL | INJECTION | Status: AC
  Filled 2011-08-03: qty 400

## 2011-08-03 MED ORDER — METOPROLOL TARTRATE 1 MG/ML IV SOLN: 2.5000 mg | INTRAVENOUS | Status: DC | PRN

## 2011-08-03 MED ORDER — ALBUMIN HUMAN 5 % IV SOLN
INTRAVENOUS | Status: DC | PRN
  Administered 2011-08-03 (×3): via INTRAVENOUS

## 2011-08-03 MED ORDER — ACETAMINOPHEN 500 MG PO TABS
1000.0000 mg | ORAL_TABLET | Freq: Four times a day (QID) | ORAL | Status: AC
  Administered 2011-08-04 – 2011-08-08 (×16): 1000 mg via ORAL
  Filled 2011-08-03 (×20): qty 2

## 2011-08-03 MED ORDER — LACTATED RINGERS IV SOLN
INTRAVENOUS | Status: DC | PRN
  Administered 2011-08-03: 07:00:00 via INTRAVENOUS

## 2011-08-03 MED ORDER — ACETAMINOPHEN 650 MG RE SUPP
650.0000 mg | RECTAL | Status: AC
  Administered 2011-08-03: 650 mg via RECTAL

## 2011-08-03 MED ORDER — FAMOTIDINE IN NACL 20-0.9 MG/50ML-% IV SOLN
20.0000 mg | Freq: Two times a day (BID) | INTRAVENOUS | Status: AC
  Administered 2011-08-04: 20 mg via INTRAVENOUS
  Filled 2011-08-03: qty 50

## 2011-08-03 MED ORDER — SODIUM CHLORIDE 0.45 % IV SOLN: INTRAVENOUS | Status: DC

## 2011-08-03 MED ORDER — PROTAMINE SULFATE 10 MG/ML IV SOLN
INTRAVENOUS | Status: DC | PRN
  Administered 2011-08-03: 260 mg via INTRAVENOUS

## 2011-08-03 MED ORDER — SODIUM CHLORIDE 0.9 % IV SOLN: 250.0000 mL | INTRAVENOUS | Status: DC

## 2011-08-03 MED ORDER — ASPIRIN EC 325 MG PO TBEC
325.0000 mg | DELAYED_RELEASE_TABLET | Freq: Every day | ORAL | Status: DC
  Administered 2011-08-04: 325 mg via ORAL
  Filled 2011-08-03 (×2): qty 1

## 2011-08-03 MED ORDER — ACETAMINOPHEN 160 MG/5ML PO SOLN: 650.0000 mg | ORAL | Status: AC

## 2011-08-03 SURGICAL SUPPLY — 133 items
ADAPTER CARDIO PERF ANTE/RETRO (ADAPTER) ×2 IMPLANT
ADH SKN CLS APL DERMABOND .7 (GAUZE/BANDAGES/DRESSINGS) ×5
ADPR PRFSN 84XANTGRD RTRGD (ADAPTER) ×1
APL SKNCLS STERI-STRIP NONHPOA (GAUZE/BANDAGES/DRESSINGS) ×1
ATTRACTOMAT 16X20 MAGNETIC DRP (DRAPES) ×2 IMPLANT
BAG DECANTER FOR FLEXI CONT (MISCELLANEOUS) ×2 IMPLANT
BENZOIN TINCTURE PRP APPL 2/3 (GAUZE/BANDAGES/DRESSINGS) ×2 IMPLANT
BLADE STERNUM SYSTEM 6 (BLADE) ×2 IMPLANT
BLADE SURG 11 STRL SS (BLADE) ×2 IMPLANT
BLADE SURG ROTATE 9660 (MISCELLANEOUS) ×2 IMPLANT
CANISTER SUCTION 2500CC (MISCELLANEOUS) ×6 IMPLANT
CANNULA FEM VENOUS REMOTE 22FR (CANNULA) ×1 IMPLANT
CANNULA FEMORAL ART 14 SM (MISCELLANEOUS) ×2 IMPLANT
CANNULA GUNDRY RCSP 15FR (MISCELLANEOUS) ×2 IMPLANT
CANNULA OPTISITE PERFUSION 16F (CANNULA) IMPLANT
CANNULA OPTISITE PERFUSION 18F (CANNULA) ×1 IMPLANT
CARDIAC SUCTION (MISCELLANEOUS) ×2 IMPLANT
CATH KIT ON Q 5IN SLV (PAIN MANAGEMENT) ×1 IMPLANT
CLOTH BEACON ORANGE TIMEOUT ST (SAFETY) ×2 IMPLANT
CONN ST 1/4X3/8  BEN (MISCELLANEOUS) ×2
CONN ST 1/4X3/8 BEN (MISCELLANEOUS) ×2 IMPLANT
CONT SPEC STER OR (MISCELLANEOUS) ×2 IMPLANT
COVER MAYO STAND STRL (DRAPES) ×2 IMPLANT
COVER SURGICAL LIGHT HANDLE (MISCELLANEOUS) ×4 IMPLANT
CRADLE DONUT ADULT HEAD (MISCELLANEOUS) ×2 IMPLANT
DERMABOND ADVANCED (GAUZE/BANDAGES/DRESSINGS) ×5
DERMABOND ADVANCED .7 DNX12 (GAUZE/BANDAGES/DRESSINGS) ×2 IMPLANT
DEVICE TROCAR PUNCTURE CLOSURE (ENDOMECHANICALS) ×2 IMPLANT
DRAIN CHANNEL 28F RND 3/8 FF (WOUND CARE) ×4 IMPLANT
DRAPE BILATERAL SPLIT (DRAPES) ×2 IMPLANT
DRAPE C-ARM 42X72 X-RAY (DRAPES) ×2 IMPLANT
DRAPE CV SPLIT W-CLR ANES SCRN (DRAPES) ×2 IMPLANT
DRAPE INCISE IOBAN 66X45 STRL (DRAPES) ×5 IMPLANT
DRAPE SLUSH/WARMER DISC (DRAPES) ×1 IMPLANT
DRSG COVADERM 4X14 (GAUZE/BANDAGES/DRESSINGS) ×1 IMPLANT
DRSG COVADERM 4X8 (GAUZE/BANDAGES/DRESSINGS) ×2 IMPLANT
ELECT BLADE 6.5 EXT (BLADE) ×2 IMPLANT
ELECT REM PT RETURN 9FT ADLT (ELECTROSURGICAL) ×4
ELECTRODE REM PT RTRN 9FT ADLT (ELECTROSURGICAL) ×2 IMPLANT
FEMORAL VENOUS CANN RAP (CANNULA) IMPLANT
GLOVE BIO SURGEON STRL SZ 6 (GLOVE) ×6 IMPLANT
GLOVE BIO SURGEON STRL SZ 6.5 (GLOVE) ×6 IMPLANT
GLOVE BIO SURGEON STRL SZ7.5 (GLOVE) ×2 IMPLANT
GLOVE BIOGEL PI IND STRL 6.5 (GLOVE) IMPLANT
GLOVE BIOGEL PI IND STRL 7.0 (GLOVE) IMPLANT
GLOVE BIOGEL PI INDICATOR 6.5 (GLOVE) ×5
GLOVE BIOGEL PI INDICATOR 7.0 (GLOVE) ×5
GLOVE ORTHO TXT STRL SZ7.5 (GLOVE) ×6 IMPLANT
GOWN STRL NON-REIN LRG LVL3 (GOWN DISPOSABLE) ×14 IMPLANT
GUIDEWIRE ANG ZIPWIRE 038X150 (WIRE) ×2 IMPLANT
INSERT CONFORM CROSS CLAMP 66M (MISCELLANEOUS) ×2 IMPLANT
INSERT CONFORM CROSS CLAMP 86M (MISCELLANEOUS) ×2 IMPLANT
IV NS 1000ML (IV SOLUTION) ×2
IV NS 1000ML BAXH (IV SOLUTION) IMPLANT
IV NS IRRIG 3000ML ARTHROMATIC (IV SOLUTION) ×3 IMPLANT
KIT BASIN OR (CUSTOM PROCEDURE TRAY) ×2 IMPLANT
KIT DILATOR VASC 18G NDL (KITS) ×2 IMPLANT
KIT DRAINAGE VACCUM ASSIST (KITS) ×1 IMPLANT
KIT ROOM TURNOVER OR (KITS) ×2 IMPLANT
KIT SUCTION CATH 14FR (SUCTIONS) ×2 IMPLANT
LEAD PACING MYOCARDI (MISCELLANEOUS) ×2 IMPLANT
LINE VENT (MISCELLANEOUS) ×1 IMPLANT
NDL AORTIC ROOT 14G 7F (CATHETERS) ×1 IMPLANT
NDL SUT 1 .5 CRC FRENCH EYE (NEEDLE) IMPLANT
NEEDLE AORTIC ROOT 14G 7F (CATHETERS) ×2 IMPLANT
NEEDLE FRENCH EYE (NEEDLE) ×2
NS IRRIG 1000ML POUR BTL (IV SOLUTION) ×12 IMPLANT
PACK OPEN HEART (CUSTOM PROCEDURE TRAY) ×2 IMPLANT
PAD ARMBOARD 7.5X6 YLW CONV (MISCELLANEOUS) ×4 IMPLANT
PAD ELECT DEFIB RADIOL ZOLL (MISCELLANEOUS) ×2 IMPLANT
PATCH CORMATRIX 4CMX7CM (Prosthesis & Implant Heart) ×1 IMPLANT
RETRACTOR TRL SOFT TISSUE LG (INSTRUMENTS) IMPLANT
RETRACTOR TRM SOFT TISSUE 7.5 (INSTRUMENTS) ×1 IMPLANT
RING MITRAL MEMO 3D 30MM SMD30 (Prosthesis & Implant Heart) ×1 IMPLANT
SET CANNULATION TOURNIQUET (MISCELLANEOUS) ×2 IMPLANT
SET CARDIOPLEGIA MPS 5001102 (MISCELLANEOUS) ×1 IMPLANT
SET IRRIG TUBING LAPAROSCOPIC (IRRIGATION / IRRIGATOR) ×2 IMPLANT
SOLUTION ANTI FOG 6CC (MISCELLANEOUS) ×2 IMPLANT
SPONGE GAUZE 4X4 12PLY (GAUZE/BANDAGES/DRESSINGS) ×2 IMPLANT
SPONGE GAUZE 4X4 FOR O.R. (GAUZE/BANDAGES/DRESSINGS) ×1 IMPLANT
SUCKER WEIGHTED FLEX (MISCELLANEOUS) ×4 IMPLANT
SUT BONE WAX W31G (SUTURE) ×2 IMPLANT
SUT ETHIBOND (SUTURE) ×3 IMPLANT
SUT ETHIBOND 2 0 SH (SUTURE) IMPLANT
SUT ETHIBOND 2 0 V4 (SUTURE) IMPLANT
SUT ETHIBOND 2 0V4 GREEN (SUTURE) IMPLANT
SUT ETHIBOND 2-0 RB-1 WHT (SUTURE) ×3 IMPLANT
SUT ETHIBOND 4 0 TF (SUTURE) IMPLANT
SUT ETHIBOND 5 0 C 1 30 (SUTURE) IMPLANT
SUT ETHIBOND NAB MH 2-0 36IN (SUTURE) ×2 IMPLANT
SUT ETHIBOND X763 2 0 SH 1 (SUTURE) ×2 IMPLANT
SUT GORETEX 6.0 TH-9 30 IN (SUTURE) IMPLANT
SUT GORETEX CV 4 TH 22 36 (SUTURE) ×2 IMPLANT
SUT GORETEX CV-5 36 IN (SUTURE) ×1 IMPLANT
SUT GORETEX CV-5THC-13 36IN (SUTURE) ×14 IMPLANT
SUT GORETEX CV4 TH-18 (SUTURE) ×5 IMPLANT
SUT GORETEX TH-18 36 INCH (SUTURE) IMPLANT
SUT MNCRL AB 3-0 PS2 18 (SUTURE) ×4 IMPLANT
SUT PROLENE 3 0 SH DA (SUTURE) ×8 IMPLANT
SUT PROLENE 3 0 SH1 36 (SUTURE) ×8 IMPLANT
SUT PROLENE 4 0 RB 1 (SUTURE) ×4
SUT PROLENE 4-0 RB1 .5 CRCL 36 (SUTURE) ×1 IMPLANT
SUT PROLENE 5 0 C 1 36 (SUTURE) ×4 IMPLANT
SUT PROLENE 6 0 C 1 30 (SUTURE) ×7 IMPLANT
SUT SILK  1 MH (SUTURE) ×9
SUT SILK 1 MH (SUTURE) ×9 IMPLANT
SUT SILK 1 TIES 10X30 (SUTURE) ×2 IMPLANT
SUT SILK 2 0 SH CR/8 (SUTURE) ×2 IMPLANT
SUT SILK 2 0 TIES 10X30 (SUTURE) ×2 IMPLANT
SUT SILK 2 0SH CR/8 30 (SUTURE) ×4 IMPLANT
SUT SILK 3 0 (SUTURE) ×2
SUT SILK 3 0 SH CR/8 (SUTURE) ×2 IMPLANT
SUT SILK 3 0SH CR/8 30 (SUTURE) ×2 IMPLANT
SUT SILK 3-0 18XBRD TIE 12 (SUTURE) ×1 IMPLANT
SUT TEM PAC WIRE 2 0 SH (SUTURE) ×4 IMPLANT
SUT VIC AB 2-0 CTX 36 (SUTURE) ×4 IMPLANT
SUT VIC AB 2-0 UR6 27 (SUTURE) ×4 IMPLANT
SUT VIC AB 3-0 SH 8-18 (SUTURE) ×7 IMPLANT
SUT VICRYL 2 TP 1 (SUTURE) ×2 IMPLANT
SYRINGE 10CC LL (SYRINGE) ×2 IMPLANT
SYSTEM SAHARA CHEST DRAIN ATS (WOUND CARE) ×2 IMPLANT
TAPE CLOTH SURG 4X10 WHT LF (GAUZE/BANDAGES/DRESSINGS) ×2 IMPLANT
TOWEL OR 17X24 6PK STRL BLUE (TOWEL DISPOSABLE) ×2 IMPLANT
TOWEL OR 17X26 10 PK STRL BLUE (TOWEL DISPOSABLE) ×3 IMPLANT
TRAY FOLEY IC TEMP SENS 14FR (CATHETERS) ×2 IMPLANT
TROCAR XCEL BLADELESS 5X75MML (TROCAR) ×2 IMPLANT
TROCAR XCEL NON-BLD 11X100MML (ENDOMECHANICALS) ×4 IMPLANT
TUBE SUCT INTRACARD DLP 20F (MISCELLANEOUS) ×2 IMPLANT
TUNNELER SHEATH ON-Q 11GX8 (MISCELLANEOUS) ×1 IMPLANT
UNDERPAD 30X30 INCONTINENT (UNDERPADS AND DIAPERS) ×2 IMPLANT
WATER STERILE IRR 1000ML POUR (IV SOLUTION) ×4 IMPLANT
WIRE BENTSON .035X145CM (WIRE) ×2 IMPLANT
YANKAUER SUCT BULB TIP NO VENT (SUCTIONS) ×1 IMPLANT

## 2011-08-03 NOTE — Transfer of Care (Signed)
Immediate Anesthesia Transfer of Care Note  Patient: Chris Lewis  Procedure(s) Performed:  MINIMALLY INVASIVE MITRAL VALVE REPAIR (MVR) - minimally invasive MVR  Patient Location: PACU and SICU  Anesthesia Type: General  Level of Consciousness: sedated and unresponsive  Airway & Oxygen Therapy: Patient remains intubated per anesthesia plan and Patient placed on Ventilator (see vital sign flow sheet for setting)  Post-op Assessment: Report given to PACU RN and Post -op Vital signs reviewed and stable  Post vital signs: Reviewed and stable  Complications: No apparent anesthesia complications

## 2011-08-03 NOTE — Procedures (Signed)
Extubation Procedure Note  Patient Details:   Name: Chris Lewis DOB: 1941/01/04 MRN: [id number]   Airway Documentation:   Pt awake and alert. Extubated per protocol, placed on 4L [place], sat 92%. Positive cuff leak, NIF-20, VC 800, HR 80, RR 25. Pt able to vocalize.  Evaluation  O2 sats: stable throughout and currently acceptable Complications: No apparent complications Patient did tolerate procedure well. Bilateral Breath Sounds: Clear   Yes  Arloa Koh 08/03/2011, 7:21 PM

## 2011-08-03 NOTE — Anesthesia Preprocedure Evaluation (Addendum)
Anesthesia Evaluation  Patient identified by MRN, date of birth, ID band Patient awake    Reviewed: Allergy & Precautions, H&P , NPO status , Patient's Chart, lab work & pertinent test results, reviewed documented beta blocker date and time   Airway Mallampati: II TM Distance: <3 FB Neck ROM: Full    Dental  (+) Teeth Intact   Pulmonary shortness of breath and with exertion, COPD COPD inhaler,  clear to auscultation        Cardiovascular hypertension, Pt. on medications Regular + Systolic murmurs    Neuro/Psych PSYCHIATRIC DISORDERS Anxiety Depression  Neuromuscular disease CVA, No Residual Symptoms    GI/Hepatic hiatal hernia, GERD-  Medicated,  Endo/Other    Renal/GU      Musculoskeletal   Abdominal   Peds  Hematology   Anesthesia Other Findings   Reproductive/Obstetrics                          Anesthesia Physical Anesthesia Plan  ASA: III  Anesthesia Plan: General   Post-op Pain Management:    Induction: Intravenous  Airway Management Planned: Double Lumen EBT  Additional Equipment: Arterial line, PA Cath, Ultrasound Guidance Line Placement, 3D TEE and CVP  Intra-op Plan:   Post-operative Plan: Post-operative intubation/ventilation  Informed Consent: I have reviewed the patients History and Physical, chart, labs and discussed the procedure including the risks, benefits and alternatives for the proposed anesthesia with the patient or authorized representative who has indicated his/her understanding and acceptance.   Dental advisory given  Plan Discussed with: CRNA and Anesthesiologist  Anesthesia Plan Comments:         Anesthesia Quick Evaluation

## 2011-08-03 NOTE — Brief Op Note (Signed)
08/03/2011  2:38 PM  PATIENT:  Chris Lewis  70 y.o. 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

## 2011-08-03 NOTE — OR Nursing (Signed)
13:50pm called vol. Desk to inform family off pump, 1st call made to SICU

## 2011-08-03 NOTE — Progress Notes (Signed)
  Echocardiogram Echocardiogram Transesophageal has been performed.  [name] 08/03/2011, 9:11 AM

## 2011-08-03 NOTE — Op Note (Signed)
CARDIOTHORACIC SURGERY OPERATIVE NOTE  Date of Procedure: 08/03/2011  Preoperative Diagnosis: Severe Mitral Regurgitation  Postoperative Diagnosis: Same  Procedure:    Minimally-Invasive Mitral Valve Repair  Complex valvuloplasty including triangular resection of posterior leaflet  Goretex neocord replacement x2  # 30 mm Sorin Memo 3D Ring Annuloplasty    Surgeon: Salvatore Decent. Cornelius Moras, MD Assistant: Al Corpus, CSFA Anesthesia: Zenon Mayo, MD  Operative Findings:  Fibroelastic deficiency type degenerative disease  Type II dysfunction with severe mitral regurgitation  Normal left ventricular systolic function  No residual mitral regurgitation following successful valve repair   BRIEF CLINICAL NOTE AND INDICATIONS FOR SURGERY  Patient is a 70 year old gentleman from [place] with long-standing history of mitral valve prolapse and mitral regurgitation. Most recently the patient has been followed by Dr. Armanda Magic with serial echocardiograms. Recent echocardiogram performed as an outpatient confirmed the presence of mitral valve prolapse with severe mitral regurgitation.  The patient reports mild exertional shortness of breath which occurs only with relatively strenuous physical activity. However, the patient also reports occasional episodes of PND. He denies any chest pain, resting SOB, orthopnea, palpitations, syncope, or lower extremity edema. Left heart catheterization performed 05/18/2011 is reviewed. This reveals normal coronary artery anatomy with no significant coronary artery disease.  Transesophageal echocardiogram performed 07/06/2011 is reviewed. This confirmed the presence of mitral valve prolapse with a flail segment of the middle scallop of the posterior leaflet and severe mitral regurgitation. There are no other complicating features. 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 There should be a high  likelihood for successful valve repair.  The patient provides full informed consent for the procedure as described.   DETAILS OF THE OPERATIVE PROCEDURE  The patient is brought to the operating room on the above mentioned date and central monitoring was established by the anesthesia team including placement of Swan-Ganz catheter through the left internal jugular vein.  A radial arterial line is placed. The patient is placed in the supine position on the operating table.  Intravenous antibiotics are administered. General endotracheal anesthesia is induced uneventfully. The patient is initially intubated using a dual lumen endotracheal tube.  A Foley catheter is placed.  Baseline transesophageal echocardiogram was performed.  Findings were notable for flail segment of middle scallop (P2) of posterior leaflet with severe mitral regurgitation.  There is normal LV systolic function.  A soft roll is placed behind the patient's left scapula and the neck gently extended and turned to the left.   The patient's right neck, chest, abdomen, both groins, and both lower extremities are prepared and draped in a sterile manner. A time out procedure is performed.  A small incision is made in the right inguinal crease and the anterior surface of the right common femoral artery and right common femoral vein are identified.  A right miniature anterolateral thoracotomy incision is performed. The incision is placed just lateral to and superior to the right nipple. The pectoralis major muscle is retracted medially and completely preserved. The right pleural space is entered through the 4th intercostal space. A soft tissue retractor is placed.  Two 11 mm ports are placed through separate stab incisions inferiorly. The right pleural space is insufflated continuously with carbon dioxide gas through the posterior port during the remainder of the operation.  A pledgeted sutures placed through the dome of the right hemidiaphragm  and retracted inferiorly to facilitate exposure.  A longitudinal incision is made in the pericardium 3 cm anterior  to the phrenic nerve and silk traction sutures are placed on either side of the incision for exposure.  The patient is placed in Trendelenburg position. The right internal jugular vein is cannulated with Seldinger technique and a guidewire advanced into the right atrium. The patient is heparinized systemically. The right internal jugular vein is cannulated with a 14 [place] pediatric femoral venous cannula. Pursestring sutures are placed on the anterior surface of the right common femoral vein and right common femoral artery. The right common femoral vein is cannulated with the Seldinger technique and a guidewire is advanced under transesophageal echocardiogram guidance through the right atrium. The femoral vein is cannulated with a long 22 French femoral venous cannula. The right common femoral artery is cannulated with Seldinger technique and a flexible guidewire is advanced until it can be appreciated intraluminally in the descending thoracic aorta on transesophageal echocardiogram. The femoral artery is cannulated with an 18 French femoral arterial cannula.  Adequate heparinization is verified.      The entire pre-bypass portion of the operation was notable for stable hemodynamics.  Cardiopulmonary bypass was begun.  Vacuum assist venous drainage is utilized. The incision in the pericardium is extended in both directions. Venous drainage and exposure are notably excellent. A retrograde cardioplegia cannula is placed through the right atrium into the coronary sinus using transesophageal echocardiogram guidance.  An antegrade cardioplegia cannula is placed in the ascending aorta.    The patient is cooled to 28C systemic temperature.  The aortic cross clamp is applied and cold blood cardioplegia is delivered initially in an antegrade fashion through the aortic root.   Supplemental cardioplegia  is given retrograde through the coronary sinus catheter. The initial cardioplegic arrest is rapid with early diastolic arrest.  Repeat doses of cardioplegia are administered intermittently every 20 to 30 minutes throughout the entire cross clamp portion of the operation through the aortic root and through the coronary sinus catheter in order to maintain completely flat electrocardiogram.  Myocardial protection was felt to be excellent.  A left atriotomy incision was performed through the interatrial groove and extended partially across the back wall of the left atrium after opening the oblique sinus inferiorly.  The mitral valve is exposed using a self-retaining retractor.  The mitral valve was inspected and notable for fibroelastic deficiency type degenerative disease with a flail portion of the middle scallop (P2) of the posterior leaflet.  There is also a small perforation in the flail segment.  There are no other complicating features.  Interrupted 2-0 Ethibond horizontal mattress sutures are placed circumferentially around the entire mitral valve annulus. The sutures will ultimately be utilized for ring annuloplasty, and at this juncture there are utilized to suspend the valve symmetrically.  A triangular resection of the flail portion of P2 is performed including the portion with a small perforation.  The intervening vertical defect is closed using interrupted everting simple CV-5 Goretex sutures.  A single pledgeted CV-4 Goretex suture is placed through the head of the posterior papillary muscle in a horizontal mattress fashion and tied.  The two limbs of the suture are gathered in an organized fashion and placed into the LV for later retrieval.    The mitral valve is sized to accept a 30 mm annuloplasty ring based upon the distance between the two commissures, the height and the total surface area of the anterior leaflet.  Ring annuloplasty is performed using a Sorin Memo 3D annuloplasty ring (size  30 mm, catalog #[id number], serial [id number]).  The ring  is secured in place uneventfully.  The ventricle is filled with saline and the repair appears competent.  The two Goretex cords are retrieved, woven through the posterior leaflet in the middle of P2, and tied after filling the ventricle with saline to adjust the correct length.  The valve is again tested with saline and appears to be perfectly competent with a broad symmetrical line of coaptation of the anterior and posterior leaflet. There is no residual leak. Rewarming is begun.  The atriotomy was closed  using a 2-layer closure of running 3-0 Prolene suture after placing a sump drain across the mitral valve to serve as a left ventricular vent.  One final dose of warm retrograde "hot shot" cardioplegia was administered retrograde through the coronary sinus catheter while all air was evacuated through the aortic root.  The aortic cross clamp was removed after a total cross clamp time of 139 minutes.  Epicardial pacing wires are fixed to the inferior wall of the right ventricule and to the right atrial appendage. The patient is rewarmed to 37C temperature. The left ventricular vent is removed.  The patient is ventilated and flow volumes turndown while the mitral valve repair is inspected using transesophageal echocardiogram. The valve repair appears intact with no residual leak. The antegrade cardioplegia cannula is now removed. The patient is weaned and disconnected from cardiopulmonary bypass.  The patient's rhythm at separation from bypass was AV paced.  The patient was weaned from bypass without any inotropic support. Total cardiopulmonary bypass time for the operation was 191 minutes.  Followup transesophageal echocardiogram performed after separation from bypass revealed a well-seated annuloplasty ring in the mitral position with a normal functioning mitral valve. There was no residual leak.  Left ventricular function was unchanged from  preoperatively.  The femoral arterial and venous cannulae were removed uneventfully. There was a palpable pulse in the distal right common femoral artery after removal of the cannula. Protamine was administered to reverse the anticoagulation. The right internal jugular cannula was removed and manual pressure held on the neck for 15 minutes.  Single lung ventilation was begun. The atriotomy closure was inspected for hemostasis. The pericardial sac was drained using a 28 French Bard drain placed through the anterior port incision.  The pericardium was closed using a patch of core matrix bovine submucosal tissue patch. The right pleural space is irrigated with saline solution and inspected for hemostasis. The right pleural space was drained using a 28 French Bard drain placed through the posterior port incision. The miniature thoracotomy incision was closed in multiple layers in routine fashion. The right groin incision was inspected for hemostasis and closed in multiple layers in routine fashion.  The post-bypass portion of the operation was notable for stable rhythm and hemodynamics.  No blood products were administered during the operation.  The patient tolerated the procedure well.  The patient was reintubated using a single lumen endotracheal tube and subsequently transported to the surgical intensive care unit in stable condition. There were no intraoperative complications. All sponge instrument and needle counts are verified correct at completion of the operation.   Salvatore Decent. Cornelius Moras MD 08/03/2011 3:02 PM

## 2011-08-03 NOTE — Anesthesia Procedure Notes (Addendum)
Procedure Name: Intubation Date/Time: 08/03/2011 8:00 AM Performed by: Elizbeth Squires Pre-anesthesia Checklist: Patient identified, Emergency Drugs available, Suction available and Patient being monitored Patient Re-evaluated:Patient Re-evaluated prior to inductionOxygen Delivery Method: Circle System Utilized Preoxygenation: Pre-oxygenation with 100% oxygen Intubation Type: IV induction Ventilation: Oral airway inserted - appropriate to patient size and Two handed mask ventilation required Grade View: Grade II Tube type: Oral Endobronchial tube: Double lumen EBT, Left, EBT position confirmed by auscultation and EBT position confirmed by fiberoptic bronchoscope and 39 Fr Number of attempts: 2 Airway Equipment and Method: video-laryngoscopy Placement Confirmation: ETT inserted through vocal cords under direct vision,  positive ETCO2 and breath sounds checked- equal and bilateral Tube secured with: Tape Dental Injury: Teeth and Oropharynx as per pre-operative assessment  Difficulty Due To: Difficult Airway- due to limited oral opening and Difficult Airway- due to reduced neck mobility Comments: DVL with MAC 3 x1 - unable to visualize cords.  DVL with glidescope.  Grade I view. Bougie used to place DLT.  Visualized placement thru cords with Glidescope.   Performed by: Elizbeth Squires Tube size: 8.0 mm Number of attempts: 1 Comments: DLT exchanged over bougie under Glidescope view.

## 2011-08-03 NOTE — Progress Notes (Signed)
Patient ID: Chris Lewis, male   DOB: [birth year], 70 y.o.   MRN: [id number]   Filed Vitals:   08/03/11 1910 08/03/11 1915 08/03/11 1930 08/03/11 1945  BP: 122/82     Pulse: 80 80 80 80  Temp: 97.3 F (36.3 C) 97.2 F (36.2 C) 97.2 F (36.2 C) 97.2 F (36.2 C)  TempSrc:      Resp: 25 21 19 17   Weight:      SpO2: 92% 95% 91% 94%   CI=2.14  Extubated Urine output good CT output low  BMET    Component Value Date/Time   NA 143 08/03/2011 1521   K 3.0* 08/03/2011 1521   CL 105 07/30/2011 1233   CO2 24 07/30/2011 1233   GLUCOSE 109* 08/03/2011 1521   BUN 13 07/30/2011 1233   CREATININE 0.95 07/30/2011 1233   CREATININE 1.08 06/01/2011 1115   CALCIUM 9.5 07/30/2011 1233   GFRNONAA 81* 07/30/2011 1233   GFRAA >90 07/30/2011 1233    CBC    Component Value Date/Time   WBC 13.5* 08/03/2011 1515   RBC 3.80* 08/03/2011 1515   HGB 11.2* 08/03/2011 1521   HCT 33.0* 08/03/2011 1521   PLT 123* 08/03/2011 1515   MCV 90.8 08/03/2011 1515   MCH 30.8 08/03/2011 1515   MCHC 33.9 08/03/2011 1515   RDW 13.0 08/03/2011 1515    A/P:  Stable s/p MV repair.  K+ repleated.  Continue present course.

## 2011-08-03 NOTE — Interval H&P Note (Signed)
History and Physical Interval Note:  08/03/2011 7:42 AM  Chris Lewis  has presented today for surgery, with the diagnosis of mitral regurgitation  The various methods of treatment have been discussed with the patient and family. After consideration of risks, benefits and other options for treatment, the patient has consented to  Procedure(s): MINIMALLY INVASIVE MITRAL VALVE REPAIR (MVR) as a surgical intervention .  The patients' history has been reviewed, patient examined, no change in status, stable for surgery.  I have reviewed the patients' chart and labs.  Questions were answered to the patient's satisfaction.     [name],[name]

## 2011-08-03 NOTE — OR Nursing (Signed)
14:30 2nd call made to SICU

## 2011-08-03 NOTE — Anesthesia Postprocedure Evaluation (Signed)
  Anesthesia Post-op Note  Patient: Chris Lewis  Procedure(s) Performed:  MINIMALLY INVASIVE MITRAL VALVE REPAIR (MVR) - minimally invasive MVR  Patient Location: SICU  Anesthesia Type: General  Level of Consciousness: Patient remains intubated per anesthesia plan  Airway and Oxygen Therapy: Patient remains intubated per anesthesia plan  Post-op Pain: none  Post-op Assessment: Post-op Vital signs reviewed, Patient's Cardiovascular Status Stable and Respiratory Function Stable  Post-op Vital Signs: Reviewed and stable  Complications: No apparent anesthesia complications

## 2011-08-04 ENCOUNTER — Inpatient Hospital Stay (HOSPITAL_COMMUNITY): Payer: BC Managed Care – PPO

## 2011-08-04 ENCOUNTER — Encounter (HOSPITAL_COMMUNITY): Payer: Self-pay | Admitting: Thoracic Surgery (Cardiothoracic Vascular Surgery)

## 2011-08-04 DIAGNOSIS — K220 Achalasia of cardia: Secondary | ICD-10-CM | POA: Insufficient documentation

## 2011-08-04 LAB — PREPARE FRESH FROZEN PLASMA: Unit division: 0

## 2011-08-04 LAB — POCT I-STAT, CHEM 8
Calcium, Ion: 1.15 mmol/L (ref 1.12–1.32)
Creatinine, Ser: 0.9 mg/dL (ref 0.50–1.35)
Glucose, Bld: 150 mg/dL — ABNORMAL HIGH (ref 70–99)
Hemoglobin: 11.2 g/dL — ABNORMAL LOW (ref 13.0–17.0)
TCO2: 25 mmol/L (ref 0–100)

## 2011-08-04 LAB — GLUCOSE, CAPILLARY
Glucose-Capillary: 93 mg/dL (ref 70–99)
Glucose-Capillary: 121 mg/dL — ABNORMAL HIGH (ref 70–99)
Glucose-Capillary: 158 mg/dL — ABNORMAL HIGH (ref 70–99)

## 2011-08-04 LAB — BASIC METABOLIC PANEL
BUN: 12 mg/dL (ref 6–23)
Calcium: 8.1 mg/dL — ABNORMAL LOW (ref 8.4–10.5)
Chloride: 108 mEq/L (ref 96–112)
Creatinine, Ser: 0.85 mg/dL (ref 0.50–1.35)
GFR calc Af Amer: >90 mL/min (ref >90)
GFR calc non Af Amer: 85 mL/min — ABNORMAL LOW (ref >90)

## 2011-08-04 LAB — CBC
HCT: 33.3 % — ABNORMAL LOW (ref 39.0–52.0)
MCH: 30.8 pg (ref 26.0–34.0)
MCH: 31.3 pg (ref 26.0–34.0)
MCHC: 33.9 g/dL (ref 30.0–36.0)
MCHC: 33.9 g/dL (ref 30.0–36.0)
MCV: 90.7 fL (ref 78.0–100.0)
Platelets: 134 10*3/uL — ABNORMAL LOW (ref 150–400)
Platelets: 115 10*3/uL — ABNORMAL LOW (ref 150–400)
RDW: 13.2 % (ref 11.5–15.5)
RDW: 13.5 % (ref 11.5–15.5)

## 2011-08-04 LAB — PREPARE PLATELET PHERESIS: Unit division: 0

## 2011-08-04 LAB — TYPE AND SCREEN
ABO/RH(D): A POS
Antibody Screen: NEGATIVE

## 2011-08-04 LAB — CREATININE, SERUM: Creatinine, Ser: 0.92 mg/dL (ref 0.50–1.35)

## 2011-08-04 LAB — MAGNESIUM: Magnesium: 2.5 mg/dL (ref 1.5–2.5)

## 2011-08-04 MED ORDER — POTASSIUM CHLORIDE 10 MEQ/50ML IV SOLN
10.0000 meq | INTRAVENOUS | Status: AC
  Administered 2011-08-04 (×3): 10 meq via INTRAVENOUS
  Filled 2011-08-04: qty 100

## 2011-08-04 MED ORDER — INSULIN ASPART 100 UNIT/ML ~~LOC~~ SOLN: 0.0000 [IU] | SUBCUTANEOUS | Status: DC

## 2011-08-04 MED ORDER — FUROSEMIDE 10 MG/ML IJ SOLN
20.0000 mg | Freq: Four times a day (QID) | INTRAMUSCULAR | Status: AC
  Administered 2011-08-04 (×2): 20 mg via INTRAVENOUS
  Filled 2011-08-04 (×3): qty 2

## 2011-08-04 MED ORDER — AMIODARONE HCL 200 MG PO TABS
200.0000 mg | ORAL_TABLET | Freq: Two times a day (BID) | ORAL | Status: DC
  Administered 2011-08-04 – 2011-08-09 (×11): 200 mg via ORAL
  Filled 2011-08-04 (×13): qty 1

## 2011-08-04 MED ORDER — PATIENT'S GUIDE TO USING COUMADIN BOOK
Freq: Once | Status: AC
  Administered 2011-08-05: 10:00:00
  Filled 2011-08-04: qty 1

## 2011-08-04 MED ORDER — WARFARIN SODIUM 2.5 MG PO TABS
2.5000 mg | ORAL_TABLET | Freq: Every day | ORAL | Status: DC
  Administered 2011-08-04 – 2011-08-06 (×3): 2.5 mg via ORAL
  Filled 2011-08-04 (×4): qty 1

## 2011-08-04 MED ORDER — WARFARIN VIDEO
Freq: Once | Status: AC
  Administered 2011-08-05: 16:00:00

## 2011-08-04 MED FILL — Potassium Chloride Inj 2 mEq/ML: INTRAVENOUS | Qty: 40 | Status: AC

## 2011-08-04 MED FILL — Magnesium Sulfate Inj 50%: INTRAMUSCULAR | Qty: 10 | Status: AC

## 2011-08-04 NOTE — Progress Notes (Signed)
   CARDIOTHORACIC SURGERY PROGRESS NOTE   R1 Day Post-Op Procedure(s) (LRB): MINIMALLY INVASIVE MITRAL VALVE REPAIR (MVR) (Right)  Subjective: Doing very well. Denies pain, SOB. No complaints.  Objective: Vital signs: BP Readings from Last 1 Encounters:  08/04/11 98/65   Pulse Readings from Last 1 Encounters:  08/04/11 80   Resp Readings from Last 1 Encounters:  08/04/11 19   Temp Readings from Last 1 Encounters:  08/04/11 98.6 F (37 C) Core (Comment)    Hemodynamics: PAP: (21-49)/(8-30) 34/14 mmHg CO:  [4 L/min-5 L/min] 4.6 L/min CI:  [2.1 L/min/m2-2.6 L/min/m2] 2.4 L/min/m2  Physical Exam:  Rhythm:   Sinus brady, A-paced  Breath sounds: clear  Heart sounds:  RRR, no murmur  Incisions:  Dressings dry  Abdomen:  Soft, non tender  Extremities:  Warm, well perfused   Intake/Output from previous day: 12/18 0701 - 12/19 0700 In: 6139.4 [I.V.:3449.4; Blood:1560; NG/GT:30; IV Piggyback:1100] Out: 6900 [Urine:4610; Blood:1600; Chest Tube:690] Intake/Output this shift: Total I/O In: 66.3 [I.V.:66.3] Out: 50 [Urine:50]  Lab Results:  [facility] 08/04/11 0424 08/03/11 2047  WBC 18.9* 13.5*  HGB 11.3* 11.7*  HCT 33.3* 35.0*  PLT 134* 126*   BMET:  Basename 08/04/11 0424 08/03/11 2047 08/03/11 2042  NA 140 -- 142  K 3.8 -- 4.3  CL 108 -- 107  CO2 23 -- --  GLUCOSE 100* -- 176*  BUN 12 -- 11  CREATININE 0.85 0.86 --  CALCIUM 8.1* -- --    CBG (last 3)   Basename 08/04/11 0735 08/04/11 0348 08/03/11 2349  GLUCAP 121* 93 145*   ABG    Component Value Date/Time   PHART 7.342* 08/03/2011 2038   HCO3 23.7 08/03/2011 2038   TCO2 24 08/03/2011 2042   ACIDBASEDEF 2.0 08/03/2011 2038   O2SAT 93.0 08/03/2011 2038   CXR: Looks good  Assessment/Plan: S/P Procedure(s) (LRB): MINIMALLY INVASIVE MITRAL VALVE REPAIR (MVR) (Right)  Doing very well POD#1 Vasodilated on low dose Neo drip Expected post op acute blood loss anemia, mild Expected post op volume  excess, mild   Mobilize  Wean neo as tolerated  Hold diuretics until stable off neo  Chris Lewis H 08/04/2011 8:31 AM

## 2011-08-04 NOTE — Progress Notes (Signed)
Patient ID: Chris Lewis, male   DOB: 11-01-1940, 70 y.o.   MRN: [id number] POD #1 MV repair Comfortable BP 92/60  Pulse 80  Temp(Src) 97.8 F (36.6 C) (Oral)  Resp 20  Ht 5\' 9"  (1.753 m)  Wt 78 kg (171 lb 15.3 oz)  BMI 25.39 kg/m2  SpO2 95% I/O net - 536 so far today Up in chair K 4.2 Cr 0.9 HCT 33

## 2011-08-04 NOTE — Progress Notes (Deleted)
Discussed in the long length of stay meeting Chris Lewis Weeks 08/04/2011  

## 2011-08-04 NOTE — Progress Notes (Signed)
UR Completed.   Muns, Rigoberto Repass Jane [phone] 08/04/2011  

## 2011-08-05 ENCOUNTER — Encounter (HOSPITAL_COMMUNITY): Payer: Self-pay | Admitting: Pharmacy Technician

## 2011-08-05 ENCOUNTER — Inpatient Hospital Stay (HOSPITAL_COMMUNITY): Payer: BC Managed Care – PPO

## 2011-08-05 LAB — BASIC METABOLIC PANEL
BUN: 15 mg/dL (ref 6–23)
CO2: 27 mEq/L (ref 19–32)
Calcium: 8 mg/dL — ABNORMAL LOW (ref 8.4–10.5)
Creatinine, Ser: 0.96 mg/dL (ref 0.50–1.35)

## 2011-08-05 LAB — CBC
MCH: 30.2 pg (ref 26.0–34.0)
MCHC: 32.6 g/dL (ref 30.0–36.0)
MCV: 92.6 fL (ref 78.0–100.0)
Platelets: 92 10*3/uL — ABNORMAL LOW (ref 150–400)
RDW: 13.8 % (ref 11.5–15.5)
WBC: 12.9 10*3/uL — ABNORMAL HIGH (ref 4.0–10.5)

## 2011-08-05 LAB — GLUCOSE, CAPILLARY: Glucose-Capillary: 121 mg/dL — ABNORMAL HIGH (ref 70–99)

## 2011-08-05 MED ORDER — HYPROMELLOSE (GONIOSCOPIC) 2.5 % OP SOLN
2.0000 [drp] | Freq: Three times a day (TID) | OPHTHALMIC | Status: DC | PRN
  Filled 2011-08-05: qty 15

## 2011-08-05 MED ORDER — PANTOPRAZOLE SODIUM 40 MG PO TBEC
40.0000 mg | DELAYED_RELEASE_TABLET | Freq: Every day | ORAL | Status: DC
Start: 2011-08-05 — End: 2011-08-09
  Administered 2011-08-05 – 2011-08-09 (×5): 40 mg via ORAL
  Filled 2011-08-05 (×4): qty 1

## 2011-08-05 MED ORDER — ALBUMIN HUMAN 5 % IV SOLN: 12.5000 g | Freq: Once | INTRAVENOUS | Status: DC

## 2011-08-05 MED ORDER — CLONAZEPAM 0.5 MG PO TABS: 0.5000 mg | ORAL_TABLET | Freq: Every evening | ORAL | Status: DC | PRN

## 2011-08-05 MED ORDER — ONDANSETRON HCL 4 MG/2ML IJ SOLN: 4.0000 mg | Freq: Four times a day (QID) | INTRAMUSCULAR | Status: DC | PRN

## 2011-08-05 MED ORDER — ALUM & MAG HYDROXIDE-SIMETH 400-400-40 MG/5ML PO SUSP
15.0000 mL | ORAL | Status: DC | PRN
  Filled 2011-08-05 (×2): qty 30

## 2011-08-05 MED ORDER — ONDANSETRON HCL 4 MG PO TABS: 4.0000 mg | ORAL_TABLET | Freq: Four times a day (QID) | ORAL | Status: DC | PRN

## 2011-08-05 MED ORDER — POTASSIUM CHLORIDE 10 MEQ/50ML IV SOLN
10.0000 meq | INTRAVENOUS | Status: AC
  Administered 2011-08-05 (×3): 10 meq via INTRAVENOUS
  Filled 2011-08-05 (×2): qty 50

## 2011-08-05 MED ORDER — PREDNISONE 5 MG PO TABS
5.0000 mg | ORAL_TABLET | Freq: Every day | ORAL | Status: DC
  Administered 2011-08-05 – 2011-08-09 (×5): 5 mg via ORAL
  Filled 2011-08-05 (×6): qty 1

## 2011-08-05 MED ORDER — ZOLPIDEM TARTRATE 5 MG PO TABS: 5.0000 mg | ORAL_TABLET | Freq: Every evening | ORAL | Status: DC | PRN

## 2011-08-05 MED ORDER — FUROSEMIDE 40 MG PO TABS
40.0000 mg | ORAL_TABLET | Freq: Two times a day (BID) | ORAL | Status: DC
  Administered 2011-08-06 – 2011-08-07 (×3): 40 mg via ORAL
  Filled 2011-08-05 (×5): qty 1

## 2011-08-05 MED ORDER — POLYVINYL ALCOHOL 1.4 % OP SOLN
2.0000 [drp] | Freq: Three times a day (TID) | OPHTHALMIC | Status: DC | PRN
  Filled 2011-08-05: qty 15

## 2011-08-05 MED ORDER — SODIUM CHLORIDE 0.9 % IJ SOLN
3.0000 mL | Freq: Two times a day (BID) | INTRAMUSCULAR | Status: DC
  Administered 2011-08-05 – 2011-08-08 (×7): 3 mL via INTRAVENOUS

## 2011-08-05 MED ORDER — TRAMADOL HCL 50 MG PO TABS: 50.0000 mg | ORAL_TABLET | ORAL | Status: DC | PRN

## 2011-08-05 MED ORDER — POVIDONE-IODINE 10 % EX SOLN
1.0000 "application " | Freq: Two times a day (BID) | CUTANEOUS | Status: DC
  Administered 2011-08-05 – 2011-08-09 (×9): 1 via TOPICAL
  Filled 2011-08-05: qty 15

## 2011-08-05 MED ORDER — ALBUTEROL SULFATE HFA 108 (90 BASE) MCG/ACT IN AERS
2.0000 | INHALATION_SPRAY | RESPIRATORY_TRACT | Status: DC | PRN
  Filled 2011-08-05: qty 6.7

## 2011-08-05 MED ORDER — POTASSIUM CHLORIDE 10 MEQ/50ML IV SOLN
INTRAVENOUS | Status: AC
  Filled 2011-08-05: qty 50

## 2011-08-05 MED ORDER — OXYCODONE HCL 5 MG PO TABS
5.0000 mg | ORAL_TABLET | ORAL | Status: DC | PRN
  Administered 2011-08-07 (×2): 5 mg via ORAL
  Administered 2011-08-08 – 2011-08-09 (×2): 10 mg via ORAL
  Filled 2011-08-05 (×2): qty 1
  Filled 2011-08-05 (×2): qty 2

## 2011-08-05 MED ORDER — ACETAMINOPHEN 325 MG PO TABS: 650.0000 mg | ORAL_TABLET | Freq: Four times a day (QID) | ORAL | Status: DC | PRN

## 2011-08-05 MED ORDER — MOVING RIGHT ALONG BOOK
Freq: Once | Status: AC
  Administered 2011-08-05: 17:00:00
  Filled 2011-08-05 (×2): qty 1

## 2011-08-05 MED ORDER — SODIUM CHLORIDE 0.9 % IJ SOLN
3.0000 mL | INTRAMUSCULAR | Status: DC | PRN
  Administered 2011-08-05: 3 mL via INTRAVENOUS
  Administered 2011-08-05: 11:00:00 via INTRAVENOUS

## 2011-08-05 MED ORDER — COUMADIN BOOK
Freq: Once | Status: AC
  Filled 2011-08-05: qty 1

## 2011-08-05 MED ORDER — POTASSIUM CHLORIDE CRYS ER 20 MEQ PO TBCR
20.0000 meq | EXTENDED_RELEASE_TABLET | Freq: Two times a day (BID) | ORAL | Status: DC
  Administered 2011-08-06 – 2011-08-07 (×3): 20 meq via ORAL
  Filled 2011-08-05 (×6): qty 1

## 2011-08-05 MED ORDER — ALBUMIN HUMAN 5 % IV SOLN
12.5000 g | Freq: Once | INTRAVENOUS | Status: AC
  Administered 2011-08-05: 12.5 g via INTRAVENOUS

## 2011-08-05 MED ORDER — SODIUM CHLORIDE 0.9 % IV SOLN: 250.0000 mL | INTRAVENOUS | Status: DC | PRN

## 2011-08-05 MED ORDER — ASPIRIN EC 81 MG PO TBEC
81.0000 mg | DELAYED_RELEASE_TABLET | Freq: Every day | ORAL | Status: DC
  Administered 2011-08-05 – 2011-08-09 (×5): 81 mg via ORAL
  Filled 2011-08-05 (×5): qty 1

## 2011-08-05 MED FILL — Electrolyte-R (PH 7.4) Solution: INTRAVENOUS | Qty: 4000 | Status: AC

## 2011-08-05 MED FILL — Heparin Sodium (Porcine) Inj 1000 Unit/ML: INTRAMUSCULAR | Qty: 60 | Status: AC

## 2011-08-05 MED FILL — Sodium Chloride Irrigation Soln 0.9%: Qty: 3000 | Status: AC

## 2011-08-05 MED FILL — Sodium Chloride IV Soln 0.9%: INTRAVENOUS | Qty: 1000 | Status: AC

## 2011-08-05 NOTE — Progress Notes (Signed)
   CARDIOTHORACIC SURGERY PROGRESS NOTE   R2 Days Post-Op Procedure(s) (LRB): MINIMALLY INVASIVE MITRAL VALVE REPAIR (MVR) (Right)  Subjective: Feels nauseated this morning.  Some pain mild chest pain. No SOB  Objective: Vital signs: BP Readings from Last 1 Encounters:  08/05/11 92/64   Pulse Readings from Last 1 Encounters:  08/05/11 58   Resp Readings from Last 1 Encounters:  08/05/11 20   Temp Readings from Last 1 Encounters:  08/05/11 97.9 F (36.6 C) Oral    Hemodynamics: PAP: (28-39)/(10-20) 35/15 mmHg  Physical Exam:  Rhythm:   Sinus w/ HR 50-60  Breath sounds: clear  Heart sounds:  RRR, no murmur  Incisions:  Dressings dry  Abdomen:  Soft, non-tender  Extremities:  warm   Intake/Output from previous day: 12/19 0701 - 12/20 0700 In: 1015.4 [I.V.:511.4; IV Piggyback:504] Out: 2305 [Urine:2005; Chest Tube:300] Intake/Output this shift:    Lab Results:  Basename 08/05/11 0430 08/04/11 1750 08/04/11 1730  WBC 12.9* -- 15.1*  HGB 10.6* 11.2* --  HCT 32.5* 33.0* --  PLT 92* -- 115*   BMET:  Basename 08/05/11 0430 08/04/11 1750 08/04/11 0424  NA 137 140 --  K 3.6 4.2 --  CL 104 103 --  CO2 27 -- 23  GLUCOSE 130* 150* --  BUN 15 12 --  CREATININE 0.96 0.90 --  CALCIUM 8.0* -- 8.1*    CBG (last 3)   Basename 08/05/11 0748 08/05/11 0355 08/04/11 2344  GLUCAP 117* 137* 150*   ABG    Component Value Date/Time   PHART 7.342* 08/03/2011 2038   HCO3 23.7 08/03/2011 2038   TCO2 25 08/04/2011 1750   ACIDBASEDEF 2.0 08/03/2011 2038   O2SAT 93.0 08/03/2011 2038   CXR: clear  Assessment/Plan: S/P Procedure(s) (LRB): MINIMALLY INVASIVE MITRAL VALVE REPAIR (MVR) (Right)  Stable POD #2  Mobilize Diuresis Transfer step down Keep tubes in until output decreased D/C metoprolol and continue amiodarone Coumadin   Arlon Bleier H 08/05/2011 8:11 AM

## 2011-08-05 NOTE — Plan of Care (Signed)
Problem: Phase III Progression Outcomes Goal: Time patient transferred to PCTU/Telemetry POD Outcome: Completed/Met Date Met:  08/05/11 08/05/11 @ 1520

## 2011-08-06 ENCOUNTER — Inpatient Hospital Stay (HOSPITAL_COMMUNITY): Payer: BC Managed Care – PPO

## 2011-08-06 LAB — CBC
MCH: 30.7 pg (ref 26.0–34.0)
MCHC: 32.8 g/dL (ref 30.0–36.0)
Platelets: 104 10*3/uL — ABNORMAL LOW (ref 150–400)
RBC: 3.71 MIL/uL — ABNORMAL LOW (ref 4.22–5.81)

## 2011-08-06 LAB — BASIC METABOLIC PANEL
Calcium: 8.6 mg/dL (ref 8.4–10.5)
GFR calc non Af Amer: 82 mL/min — ABNORMAL LOW (ref >90)
Sodium: 139 mEq/L (ref 135–145)

## 2011-08-06 LAB — PROTIME-INR: Prothrombin Time: 15.2 seconds (ref 11.6–15.2)

## 2011-08-06 MED ORDER — GUAIFENESIN ER 600 MG PO TB12
600.0000 mg | ORAL_TABLET | Freq: Two times a day (BID) | ORAL | Status: DC
  Administered 2011-08-06 – 2011-08-09 (×6): 600 mg via ORAL
  Filled 2011-08-06 (×8): qty 1

## 2011-08-06 NOTE — Progress Notes (Addendum)
3 Days Post-Op Procedure(s) (LRB): MINIMALLY INVASIVE MITRAL VALVE REPAIR (MVR) (Right)  Subjective: C/O cough, with thick, dark mucus.  Hard to cough it up.  Also, received multiple laxatives, etc.and had diarrhea yesterday.  Feels better this am, but still weak.  Objective: Vital signs in last 24 hours: Patient Vitals for the past 24 hrs:  BP Temp Temp src Pulse Resp SpO2 Weight  08/06/11 0530 122/73 mmHg 97.7 F (36.5 C) Oral 65  20  93 % -  08/06/11 0441 - - - - - - 169 lb 8.5 oz (76.9 kg)  08/05/11 2047 - 98.7 F (37.1 C) Oral 71  20  91 % -  08/05/11 1519 102/62 mmHg 97.5 F (36.4 C) Oral 62  20  95 % -  08/05/11 1202 - 97.5 F (36.4 C) Oral - - - -  08/05/11 1200 105/64 mmHg - - 59  21  94 % -  08/05/11 1000 96/59 mmHg - - 58  14  92 % -  08/05/11 0900 99/61 mmHg - - 58  18  92 % -   Current Weight  08/06/11 169 lb 8.5 oz (76.9 kg)     Intake/Output from previous day: 12/20 0701 - 12/21 0700 In: 203 [I.V.:53; IV Piggyback:150] Out: 902 [Urine:400; Emesis/NG output:20; Stool:2; Chest Tube:480]    PHYSICAL EXAM:  Heart: RRR Lungs: coarse bilateral BS Wound: clean and dry Extremities: mild LE edema  Lab Results: CBC: Basename 08/06/11 0552 08/05/11 0430  WBC 11.0* 12.9*  HGB 11.4* 10.6*  HCT 34.8* 32.5*  PLT 104* 92*   BMET:  Basename 08/06/11 0552 08/05/11 0430  NA 139 137  K 4.7 3.6  CL 105 104  CO2 28 27  GLUCOSE 112* 130*  BUN 17 15  CREATININE 0.93 0.96  CALCIUM 8.6 8.0*    PT/INR:  Basename 08/06/11 0552  LABPROT 15.2  INR 1.18   CXR- stable small right pleural effusion/atx  Assessment/Plan: S/P Procedure(s) (LRB): MINIMALLY INVASIVE MITRAL VALVE REPAIR (MVR) (Right) CV- SR/SB, HRs in 50s-70s.  BPs 90-120s.  Continue low dose beta blocker, Amiodarone. Pulm- encouraged IS, will add flutter valve, Mucinex.  Wean O2 as tolerated (on 2.5 L). CT put out 480 cc over past 24 hours.  Will leave for now.   Cont. Coumadin for MV repair. GI-  hold laxatives and stool softeners today. CRPI Vol overload- continue diuresis.   LOS: 3 days    COLLINS,GINA H 08/06/2011   I have seen and examined the patient and agree with Ms. Collins' assessment

## 2011-08-06 NOTE — Progress Notes (Signed)
CARDIAC REHAB PHASE I   PRE:  Rate/Rhythm: 65 SR    BP: sitting 107/56    SaO2: 95 2 1/2 L  MODE:  Ambulation: 350 ft   POST:  Rate/Rhythm: 76 SR    BP: sitting 94/56     SaO2: 93 2L  Tolerated well, steady with RW, 2L and assist x2. No specific c/o. To recliner.  [phone]  Harriet Masson CES, ACSM

## 2011-08-07 ENCOUNTER — Inpatient Hospital Stay (HOSPITAL_COMMUNITY): Payer: BC Managed Care – PPO

## 2011-08-07 LAB — PROTIME-INR
INR: 1.04 (ref 0.00–1.49)
Prothrombin Time: 13.8 seconds (ref 11.6–15.2)

## 2011-08-07 MED ORDER — WARFARIN SODIUM 5 MG PO TABS
5.0000 mg | ORAL_TABLET | Freq: Every day | ORAL | Status: DC
  Administered 2011-08-07 – 2011-08-08 (×2): 5 mg via ORAL
  Filled 2011-08-07 (×3): qty 1

## 2011-08-07 MED ORDER — FUROSEMIDE 40 MG PO TABS
40.0000 mg | ORAL_TABLET | Freq: Every day | ORAL | Status: DC
  Administered 2011-08-08: 40 mg via ORAL
  Filled 2011-08-07 (×2): qty 1

## 2011-08-07 MED ORDER — POTASSIUM CHLORIDE CRYS ER 20 MEQ PO TBCR
20.0000 meq | EXTENDED_RELEASE_TABLET | Freq: Every day | ORAL | Status: DC
  Administered 2011-08-08: 20 meq via ORAL
  Filled 2011-08-07 (×2): qty 1

## 2011-08-07 NOTE — Progress Notes (Signed)
Chest tube discontinued per protocol 08/07/2011 At 10:00 AM Patient tolerated well. Vaseline occlusive dressing and gauze applied.  Elease Hashimoto RN

## 2011-08-07 NOTE — Progress Notes (Addendum)
[address]            [place] [address]          [phone]     4 Days Post-Op  Procedure(s) (LRB): MINIMALLY INVASIVE MITRAL VALVE REPAIR (MVR) (Right) Subjective: Feeling stronger, appetite improved. Shortness of breath improving.  Objective  Telemetry NSR  Temp:  [97.9 F (36.6 C)-99.3 F (37.4 C)] 97.9 F (36.6 C) (12/22 0510) Pulse Rate:  [65-69] 67  (12/22 0510) Resp:  [18] 18  (12/22 0510) BP: (88-103)/(56-72) 103/72 mmHg (12/22 0510) SpO2:  [89 %-96 %] 91 % (12/22 0510) Weight:  [164 lb 0.4 oz (74.4 kg)] 164 lb 0.4 oz (74.4 kg) (12/22 0510)   Intake/Output Summary (Last 24 hours) at 08/07/11 0841 Last data filed at 08/07/11 0700  Gross per 24 hour  Intake    240 ml  Output   2245 ml  Net  -2005 ml       General appearance: alert, cooperative and no distress Heart: regular rate and rhythm, S1, S2 normal and no murmur or rub Lungs: mild coarseness on right, otherwise clear Abdomen: soft, non-tender; bowel sounds normal; no masses,  no organomegaly Extremities: no significant edema Wound: incisions healing without signs of infection  Lab Results:  Basename 08/06/11 0552 08/05/11 0430 08/04/11 1730  NA 139 137 --  K 4.7 3.6 --  CL 105 104 --  CO2 28 27 --  GLUCOSE 112* 130* --  BUN 17 15 --  CREATININE 0.93 0.96 --  CALCIUM 8.6 8.0* --  MG -- -- 2.2  PHOS -- -- --   No results found for this basename: AST:2,ALT:2,ALKPHOS:2,BILITOT:2,PROT:2,ALBUMIN:2 in the last 72 hours No results found for this basename: LIPASE:2,AMYLASE:2 in the last 72 hours  Basename 08/06/11 0552 08/05/11 0430  WBC 11.0* 12.9*  NEUTROABS -- --  HGB 11.4* 10.6*  HCT 34.8* 32.5*  MCV 93.8 92.6  PLT 104* 92*   No results found for this basename: CKTOTAL:4,CKMB:4,TROPONINI:4 in the last 72 hours No components found with this basename: POCBNP:3 No results found for this basename: DDIMER in the last 72 hours No results found for this  basename: HGBA1C in the last 72 hours No results found for this basename: CHOL,HDL,LDLCALC,TRIG,CHOLHDL in the last 72 hours No results found for this basename: TSH,T4TOTAL,FREET3,T3FREE,THYROIDAB in the last 72 hours No results found for this basename: VITAMINB12,FOLATE,FERRITIN,TIBC,IRON,RETICCTPCT in the last 72 hours  Medications: Scheduled    . acetaminophen  1,000 mg Oral Q6H   Or  . acetaminophen (TYLENOL) oral liquid 160 mg/5 mL  975 mg Per Tube Q6H  . amiodarone  200 mg Oral BID PC  . aspirin EC  81 mg Oral Daily  . furosemide  40 mg Oral BID  . guaiFENesin  600 mg Oral BID  . metoprolol tartrate  12.5 mg Oral BID   Or  . metoprolol tartrate  12.5 mg Per Tube BID  . pantoprazole  40 mg Oral QAC breakfast  . potassium chloride  20 mEq Oral BID PC  . povidone-iodine  1 application Topical BID  . predniSONE  5 mg Oral Daily  . simvastatin  40 mg Oral q1800  . sodium chloride  3 mL Intravenous Q12H  . tiotropium  18 mcg Inhalation Daily  . warfarin  2.5 mg Oral q1800  . DISCONTD: bisacodyl  10 mg Oral Daily  . DISCONTD: bisacodyl  10 mg Rectal Daily     Radiology/Studies:  Dg Chest Port 1 View  08/06/2011  *RADIOLOGY REPORT*  Clinical Data: Open heart surgery, follow-up  PORTABLE CHEST - 1 VIEW  Comparison: Portable chest x-ray of 08/05/2011  Findings: Two right chest tubes remain and no pneumothorax is seen. There is right basilar atelectasis present.  Mild right chest wall subcutaneous emphysema remains.  Cardiomegaly is stable.  IMPRESSION: Two right chest tubes remain with right basilar atelectasis.  No pneumothorax.  Original Report Authenticated By: Juline Patch, M.D.    INR: 1.04 Will add last result for INR, ABG once components are confirmed Will add last 4 CBG results once components are confirmed  Assessment/Plan: S/P Procedure(s) (LRB): MINIMALLY INVASIVE MITRAL VALVE REPAIR (MVR) (Right)  1. Rhythm stable on current rx 2. CBG well controlled 3.  Decrease lasix to once daily 4. D/c chest tubes, 170 cc out yesterday 5. Increase coumadin dose  LOS: 4 days    GOLD,WAYNE E 12/22/20128:41 AM    I have seen and examined the patient and agree with the above assessment. On Q empty- will d/c

## 2011-08-07 NOTE — Progress Notes (Signed)
CARDIAC REHAB PHASE I   PRE:  Rate/Rhythm: 65 SR  BP:  Supine: 100/62  Sitting:   Standing:    SaO2: 90 RA  MODE:  Ambulation: 450 ft   POST:  Rate/Rhythem: 69 SR  BP:  Supine:   Sitting: 100/56  Standing:    SaO2: 91 RA  Pt did well with ambulation in hallway.  At half way point, pt SaO2 was 89% RA.  Pt had no complaints with walk and returned to bed with call bell in reach after walk.  Reviewed pt d/c education.  Pt would like walker for home use as it makes him feel safer.  Also, please continue to monitor oxygen level for home use since he does linger around 90%.  Fabio Pierce, MA, ACSM RCEP   Juanetta Gosling, Doy Mince

## 2011-08-07 NOTE — Progress Notes (Signed)
Patient ambulated 300 ft on room air.  Patient tolerated procedure well. Patient 02 sats 95/ra.  Will continue to monitor

## 2011-08-07 NOTE — Progress Notes (Signed)
Discussed discharge plan with pt (anticipate discharge Monday, 08-09-11). Pt. stated that he will ask friends and family to stay with him, alternating, so that he has 24/7 assistance for at least one week after d/c. Pt. expressed concern about going to office frequently for PT/INR lab draws as he may not always have someone to drive him. Will set him up with [facility] for lab draws, HHPT, and RW for home. Patient agreeable and has prior experience with [facility] through family member and would like [facility] for himself. CM notified. Harlow Asa

## 2011-08-08 MED ORDER — AMIODARONE HCL 200 MG PO TABS: 200.0000 mg | ORAL_TABLET | Freq: Two times a day (BID) | ORAL | Status: DC

## 2011-08-08 MED ORDER — WARFARIN SODIUM 5 MG PO TABS: 5.0000 mg | ORAL_TABLET | Freq: Every day | ORAL | Status: DC

## 2011-08-08 MED ORDER — METOPROLOL TARTRATE 12.5 MG HALF TABLET: 12.5000 mg | ORAL_TABLET | Freq: Two times a day (BID) | ORAL | Status: DC

## 2011-08-08 MED ORDER — POTASSIUM CHLORIDE CRYS ER 20 MEQ PO TBCR: 20.0000 meq | EXTENDED_RELEASE_TABLET | Freq: Every day | ORAL | Status: DC

## 2011-08-08 MED ORDER — OXYCODONE HCL 5 MG PO TABS: 5.0000 mg | ORAL_TABLET | Freq: Four times a day (QID) | ORAL | Status: AC | PRN

## 2011-08-08 MED ORDER — FUROSEMIDE 40 MG PO TABS: 40.0000 mg | ORAL_TABLET | Freq: Every day | ORAL | Status: DC

## 2011-08-08 NOTE — Progress Notes (Addendum)
[address]            [place] [address]          [phone]     5 Days Post-Op  Procedure(s) (LRB): MINIMALLY INVASIVE MITRAL VALVE REPAIR (MVR) (Right) Subjective: Continues to feel stronger  Objective  Telemetry NSR  Temp:  [98 F (36.7 C)-98.7 F (37.1 C)] 98.3 F (36.8 C) (12/23 0430) Pulse Rate:  [67-74] 70  (12/23 0430) Resp:  [18] 18  (12/23 0430) BP: (100-112)/(60-62) 112/61 mmHg (12/23 0430) SpO2:  [92 %-94 %] 94 % (12/23 0900) Weight:  [161 lb 8 oz (73.256 kg)] 161 lb 8 oz (73.256 kg) (12/23 0430)   Intake/Output Summary (Last 24 hours) at 08/08/11 1010 Last data filed at 08/07/11 1738  Gross per 24 hour  Intake    480 ml  Output    650 ml  Net   -170 ml       General appearance: alert, cooperative and no distress Heart: RRR, soft syst murmur Lungs: mildly diminished in bases Abdomen: soft, non-tender; bowel sounds normal; no masses,  no organomegaly Extremities: minor edema Wound: incisions healing well  Lab Results:  Basename 08/06/11 0552  NA 139  K 4.7  CL 105  CO2 28  GLUCOSE 112*  BUN 17  CREATININE 0.93  CALCIUM 8.6  MG --  PHOS --   No results found for this basename: AST:2,ALT:2,ALKPHOS:2,BILITOT:2,PROT:2,ALBUMIN:2 in the last 72 hours No results found for this basename: LIPASE:2,AMYLASE:2 in the last 72 hours  Basename 08/06/11 0552  WBC 11.0*  NEUTROABS --  HGB 11.4*  HCT 34.8*  MCV 93.8  PLT 104*   No results found for this basename: CKTOTAL:4,CKMB:4,TROPONINI:4 in the last 72 hours No components found with this basename: POCBNP:3 No results found for this basename: DDIMER in the last 72 hours No results found for this basename: HGBA1C in the last 72 hours No results found for this basename: CHOL,HDL,LDLCALC,TRIG,CHOLHDL in the last 72 hours No results found for this basename: TSH,T4TOTAL,FREET3,T3FREE,THYROIDAB in the last 72 hours No results found for this basename:  VITAMINB12,FOLATE,FERRITIN,TIBC,IRON,RETICCTPCT in the last 72 hours  Medications: Scheduled    . acetaminophen  1,000 mg Oral Q6H   Or  . acetaminophen (TYLENOL) oral liquid 160 mg/5 mL  975 mg Per Tube Q6H  . amiodarone  200 mg Oral BID PC  . aspirin EC  81 mg Oral Daily  . furosemide  40 mg Oral Daily  . guaiFENesin  600 mg Oral BID  . metoprolol tartrate  12.5 mg Oral BID   Or  . metoprolol tartrate  12.5 mg Per Tube BID  . pantoprazole  40 mg Oral QAC breakfast  . potassium chloride  20 mEq Oral Daily  . povidone-iodine  1 application Topical BID  . predniSONE  5 mg Oral Daily  . simvastatin  40 mg Oral q1800  . sodium chloride  3 mL Intravenous Q12H  . tiotropium  18 mcg Inhalation Daily  . warfarin  5 mg Oral q1800     Radiology/Studies:  Dg Chest Port 1v Same Day  08/07/2011  *RADIOLOGY REPORT*  Clinical Data: Chest tube removal, follow-up  PORTABLE CHEST - 1 VIEW SAME DAY  Comparison: Portable chest x-ray of 08/06/2011  Findings: The right chest tubes have been removed.  No pneumothorax is seen.  Right basilar linear atelectasis is noted and aeration has  improved.  Mild cardiomegaly is stable.  IMPRESSION: Right chest tubes removed.  No pneumothorax.  Slightly better aeration with persistent right basilar linear atelectasis.  Original Report Authenticated By: Juline Patch, M.D.    INR:1.18 Will add last result for INR, ABG once components are confirmed Will add last 4 CBG results once components are confirmed  Assessment/Plan: S/P Procedure(s) (LRB): MINIMALLY INVASIVE MITRAL VALVE REPAIR (MVR) (Right) 1. Cont rehab 2. Cont pulm toilet 3. Cont diuresis 4. Cont ac rx, d/c epw's  5. Poss home in am   LOS: 5 days    GOLD,WAYNE E 12/23/201210:10 AM    I have seen and examined the patient and agree with the assessment and plan above INR yet to bump, 5 mg coumadin today

## 2011-08-08 NOTE — Progress Notes (Signed)
EPW discontinued per protocol. Tips intact. Patient tolerated well. Last INR INR/Prothrombin Time on   .  Patient advised Bedrest X 1 hour. Chris Lewis

## 2011-08-08 NOTE — Discharge Summary (Signed)
[address]            [place] [address]          [phone]      JULEN RUBERT July 11, 1941 70 y.o. [id number]  08/03/2011   Purcell Nails, MD  mitral regurgitation HPI:  Patient is a 70 year old gentleman from [place] with long-standing history of mitral valve prolapse and mitral regurgitation. Most recently the patient has been followed by Dr. Armanda Magic with serial echocardiograms. Recent echocardiogram performed as an outpatient confirmed the presence of mitral valve prolapse with severe mitral regurgitation. Elective cardiothoracic surgical consultation has been requested to consider mitral valve repair.  Past Medical History   Diagnosis  Date   .  Achalasia    .  Hypercholesterolemia    .  Chronic obstructive pulmonary disease    .  Anxiety    .  Seasonal allergic rhinitis    .  Transient ischemic attack    .  Mitral valve regurgitation    .  Osteopenia    .  FH: cholecystectomy    .  S/P right inguinal herniorrhaphy    .  COPD (chronic obstructive pulmonary disease)     Past Surgical History   Procedure  Date   .  Back surgery      x 3   .  Foot surgery    .  Esophagogastroduodenoscopy and savary esophageal dilation.      x9   .  Eyebrow surgery  2012   .  Rt inguinal herniorrhaphy  1969/1997   .  Cholecystectomy    .  Esophagoscopy w/ botox injection      x9 beginning 2008, most recent Sept. 2012    Family History   Problem  Relation  Age of Onset   .  Stroke  Father  67      cva    .  Scleroderma  Mother  15   .  Lung cancer  Sister    .  Heart disease  Sister    Social History  History   Substance Use Topics   .  Smoking status:  Former Smoker     Quit date:  08/16/1997   .  Smokeless tobacco:  Never Used   .  Alcohol Use:  No    Current Outpatient Prescriptions   Medication  Sig  Dispense  Refill   .  albuterol-ipratropium (COMBIVENT) 18-103 MCG/ACT inhaler  Inhale 2 puffs into the lungs every 6  (six) hours as needed.     Marland Kitchen  aspirin 81 MG tablet  Take 81 mg by mouth daily.     .  beta carotene w/minerals (OCUVITE) tablet  Take 1 tablet by mouth daily.     .  calcium carbonate 200 MG capsule  Take 250 mg by mouth 2 (two) times daily with a meal.     .  clonazePAM (KLONOPIN) 0.5 MG tablet  Take 0.5 mg by mouth 2 (two) times daily as needed.     Marland Kitchen  dextromethorphan-guaiFENesin (MUCINEX DM) 30-600 MG per 12 hr tablet  Take 1 tablet by mouth every 12 (twelve) hours.     Marland Kitchen  doxycycline (VIBRA-TABS) 100 MG tablet  Take 100 mg by mouth daily.     .  metroNIDAZOLE (METROGEL) 0.75 % gel  Apply topically 2 (two) times daily.     Marland Kitchen  multivitamin-lutein (OCUVITE-LUTEIN) CAPS  Take 1 capsule by mouth daily.     .  predniSONE (DELTASONE) 20 MG tablet  5 mg. Tapering down 2 1/2 tablets po every day     .  tiotropium (SPIRIVA) 18 MCG inhalation capsule  Place 18 mcg into inhaler and inhale daily.     .  VENTOLIN HFA 108 (90 BASE) MCG/ACT inhaler  USE 1-2 PUFFS EVERY 4 TO 6 HOURS AS NEEDED  1 Inhaler  6    Allergies   Allergen  Reactions   .  Atorvastatin      REACTION: muscle pain   .  Contrast Media (Iodinated Diagnostic Agents)    .  Rosuvastatin      REACTION: muscle weakness   Review of Systems at time of consultation Constitutional: Positive for unexpected weight change.  The patient reports losing a few pounds in weight recently because he waited so long to have his achalasia treated.  HENT: Negative.  Eyes: Negative.  Respiratory: Positive for cough and shortness of breath.  Cardiovascular: Negative for chest pain, palpitations and leg swelling.  The patient reports mild exertional shortness of breath which occurs only with relatively strenuous physical activity. However, the patient also reports occasional episodes of PND. He denies any chest pain, resting SOB, orthopnea, palpitations, syncope, or lower extremity edema.  Gastrointestinal:  The patient has longstanding symptoms of  difficulty swallowing some types of solid foods which has partially improved following recent EGD with Botox injection.  Genitourinary: Positive for frequency.  Musculoskeletal: Positive for myalgias.  Neurological: Negative.  Hematological: Negative.  Psychiatric/Behavioral: Negative.  BP 129/80  Pulse 90  Resp 20  Ht 5\' 9"  (1.753 m)  Wt 165 lb (74.844 kg)  BMI 24.37 kg/m2  SpO2 95%  Physical Exam at time of consultation  Vitals reviewed.  Constitutional: He is oriented to person, place, and time. He appears well-developed and well-nourished.  HENT:  Head: Normocephalic.  Eyes: Pupils are equal, round, and reactive to light.  Neck: Normal range of motion. Neck supple. No JVD present.  Cardiovascular: Normal rate and regular rhythm.  Murmur heard. Prominent grade IV/VI holosystolic murmur heard best at the apex with radiation all across the precordium and to the back.  Pulmonary/Chest: Effort normal and breath sounds normal. He has no wheezes. He has no rales.  Abdominal: Soft. Bowel sounds are normal.  Musculoskeletal: Normal range of motion. He exhibits no edema.  Lymphadenopathy:  He has no cervical adenopathy.  Neurological: He is alert and oriented to person, place, and time.  Grossly non-focal  Skin: Skin is warm and dry.  Psychiatric: He has a normal mood and affect. His behavior is normal. Judgment and thought content normal.  Diagnostic Tests:  Transthoracic echocardiogram performed at [facility] cardiology on 04/29/2011 is reviewed and compared with transthoracic echocardiogram performed at [place] on 05/18/2011. Both reveal the presence of at least moderate mitral regurgitation with the jet of regurgitation that is eccentric and directed anteriorly around the left atrium. There appears to be likely prolapse of the posterior leaflet, although the leaflet is not well visualized. Left ventricular size and function are normal. No other significant abnormalities are noted.  There is mild left atrial enlargement. There is trace to mild tricuspid regurgitation.  Left heart catheterization performed 05/18/2011 is reviewed. This reveals normal coronary artery anatomy with no significant coronary artery disease. Right heart pressure data are not available. Left ventricular function appears normal. There is mitral regurgitation appreciated, although probably not 4+.  Upper GI barium swallow performed October 5 is reviewed. The esophagus is only mildly dilated. There is classical bird beak appearance of the GE junction consistent with long-standing achalasia. There does not appear to be any sort of epiphrenic diverticulum or significant other distal esophageal pathology. Contrast does pass into the stomach although delayed.  CT angiogram of the chest abdomen and pelvis performed 06/04/2011 is reviewed. This demonstrates mild atherosclerotic disease afflicting the descending thoracic and abdominal aorta. There is some eccentric plaque located in the descending abdominal aorta below the renal arteries. This is fairly mild and there is minimal associated calcification. There are no flow limiting lesions noted in there are no significant intraluminal filling defects appreciated. There was a very small 4 mm benign-appearing nodule appreciated in the right upper lobe. This does not appear very suspicious that all.  Transesophageal echocardiogram performed 07/06/2011 is reviewed. This confirmed the presence of mitral valve prolapse with a flail segment of the middle scallop of the posterior leaflet and severe mitral regurgitation. There are no other complicating features. 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 There should be a high likelihood for successful valve repair.  Pulmonary function tests performed 07/30/2011 are reviewed. These demonstrate severe chronic obstructive pulmonary disease with baseline FEV1 measured 1.42 L or 46%  predicted. This increased slightly to 1.58 L with bronchodilator therapy. Forced vital capacity was 2.91 L. The FEF 25-75 was 0.57 L or 25% predicted. There was evidence for hyperinflation. Diffusion capacity was 56% predicted.  Dr. Cornelius Moras fully evaluated the patient and his studies and agreed with recommendations to proceed with minimally invasive mitral valve repair. He was admitted this hospitalization for the procedure.   Procedure:   CARDIOTHORACIC SURGERY OPERATIVE NOTE  Date of Procedure: 08/03/2011  Preoperative Diagnosis: Severe Mitral Regurgitation  Postoperative Diagnosis: Same  Procedure:  Minimally-Invasive Mitral Valve Repair  Complex valvuloplasty including triangular resection of posterior leaflet  Goretex neocord replacement x2  # 30 mm Sorin Memo 3D Ring Annuloplasty Surgeon: Salvatore Decent. Cornelius Moras, MD  Assistant: Al Corpus, CSFA  Anesthesia: Zenon Mayo, MD  Operative Findings:  Fibroelastic deficiency type degenerative disease  Type II dysfunction with severe mitral regurgitation  Normal left ventricular systolic function  No residual mitral regurgitation following successful valve repair   He tolerated the procedure well and was taken to the surgical intensive care unit in stable condition  Post operative hospital course:  Overall the patient has done quite nicely. He has remained hemodynamically stable initially requiring low dose pressor support for vasodilation, however this was discontinued without difficulty. Additionally, he was weaned from the ventilator without difficulty. All routine lines, monitors, drainage devices have been discontinued in the standard fashion. He does have a moderate postoperative volume overload but is responding well to diuretics. He does have a mild acute blood loss anemia. These values have stabilized. Most recent hematocrit is 34.8 on 08/06/2011. Incisions are healing well without evidence of infection. He is tolerating diet. He  is tolerating increasing activities using standard postoperative protocols. He has had no significant cardiac dysrhythmias. He has been started on Coumadin and we are monitoring daily PT/INR. Currently his status is felt to be tentatively stable for discharge in the morning following rounds.       Basename 08/06/11 0552  NA 139  K 4.7  CL 105  CO2 28  GLUCOSE 112*  BUN 17  CALCIUM 8.6    Basename 08/06/11 0552  WBC 11.0*  HGB 11.4*  HCT 34.8*  PLT 104*    Basename 08/08/11 0705 08/07/11 0530  INR 1.18 1.04     Discharge Instructions:  The patient is discharged to home with extensive instructions on wound care and progressive ambulation.  They are instructed not to drive or perform any heavy lifting until returning to see the physician in his office.  Discharge Diagnosis:  mitral regurgitation  Secondary Diagnosis: Patient Active Problem List  Diagnoses  . HYPERLIPIDEMIA  . ANXIETY  . DEPRESSION  . HYPERTENSION, BENIGN  . ALLERGIC RHINITIS, SEASONAL  . COPD  . Esophageal reflux  . Transient ischemic attack  . History of back surgery  . MVP (mitral valve prolapse)  . Incidental lung nodule, > 3mm and < 8mm  . Mitral regurgitation due to cusp prolapse  . S/P mitral valve repair  . Achalasia   Past Medical History  Diagnosis Date  . Achalasia   . Hypercholesterolemia   . Chronic obstructive pulmonary disease   . Seasonal allergic rhinitis   . Transient ischemic attack   . Mitral valve regurgitation   . Osteopenia   . FH: cholecystectomy   . S/P right inguinal herniorrhaphy   . COPD (chronic obstructive pulmonary disease)   . MVP (mitral valve prolapse)   . MR (mitral regurgitation)   . GERD (gastroesophageal reflux disease)   . Shortness of breath     sometimes at rest  . Stroke 10/2002  . Arthritis   . Anxiety     causes shortness  . Hiatal hernia   . Depression        [place], Blowe  Home Medication Instructions ZOX:[id number]   Printed  on:08/08/11 1234  Medication Information                    aspirin 81 MG tablet Take 81 mg by mouth daily.             clonazePAM (KLONOPIN) 0.5 MG tablet Take 0.5 mg by mouth at bedtime as needed. For sleep.           simvastatin (ZOCOR) 40 MG tablet Take 40 mg by mouth daily.             predniSONE (DELTASONE) 5 MG tablet Take 5 mg by mouth daily.            Lutein 6 MG CAPS Take 1 capsule by mouth daily.            Biotin 1000 MCG tablet Take 1,000 mcg by mouth daily.             Cholecalciferol (VITAMIN D3) 1000 UNITS CAPS Take 1,000 Units by mouth 2 (two) times daily.            calcium carbonate (TUMS EX) 750 MG chewable tablet Chew 1 tablet by mouth as needed. For gas and reflux.           tiotropium (SPIRIVA) 18 MCG inhalation capsule Place 1 capsule (18 mcg total) into inhaler and inhale daily.           albuterol (VENTOLIN HFA) 108 (90 BASE) MCG/ACT inhaler Inhale 2 puffs into the lungs every 4 (four) hours as needed for wheezing.           Calcium Citrate-Vitamin D (CALCIUM CITRATE + PO) Take 1 tablet by mouth daily.             Multiple Vitamins-Minerals (MULTIVITAMINS THER. W/MINERALS) TABS Take 1 tablet by mouth daily.  Polyethyl Glycol-Propyl Glycol (SYSTANE OP) Apply 1 drop to eye 2 (two) times daily.             amiodarone (PACERONE) 200 MG tablet Take 1 tablet (200 mg total) by mouth 2 (two) times daily after a meal.           furosemide (LASIX) 40 MG tablet Take 1 tablet (40 mg total) by mouth daily.           metoprolol tartrate (LOPRESSOR) 12.5 mg TABS Take 0.5 tablets (12.5 mg total) by mouth 2 (two) times daily.           oxyCODONE (OXY IR/ROXICODONE) 5 MG immediate release tablet Take 1-2 tablets (5-10 mg total) by mouth every 6 (six) hours as needed for pain.           potassium chloride SA (K-DUR,KLOR-CON) 20 MEQ tablet Take 1 tablet (20 mEq total) by mouth daily.           warfarin (COUMADIN) 5 MG tablet Take 1 tablet (5  mg total) by mouth daily at 6 PM.             Disposition: Discharged home  Patient's condition is Good  Gershon Crane, PA-C 08/08/2011  12:34 PM

## 2011-08-09 MED ORDER — SIMVASTATIN 20 MG PO TABS
20.0000 mg | ORAL_TABLET | Freq: Every day | ORAL | Status: DC
  Filled 2011-08-09: qty 1

## 2011-08-09 MED ORDER — METOPROLOL TARTRATE 12.5 MG HALF TABLET
12.5000 mg | ORAL_TABLET | Freq: Two times a day (BID) | ORAL | Status: DC
  Administered 2011-08-09: 12.5 mg via ORAL
  Filled 2011-08-09 (×2): qty 1

## 2011-08-09 MED ORDER — METOPROLOL TARTRATE 25 MG/10 ML ORAL SUSPENSION
12.5000 mg | Freq: Two times a day (BID) | ORAL | Status: DC
  Filled 2011-08-09 (×2): qty 5

## 2011-08-09 MED ORDER — METOPROLOL TARTRATE 25 MG PO TABS: 25.0000 mg | ORAL_TABLET | Freq: Two times a day (BID) | ORAL | Status: DC

## 2011-08-09 MED ORDER — METOPROLOL TARTRATE 25 MG/10 ML ORAL SUSPENSION: 12.5000 mg | Freq: Two times a day (BID) | ORAL | Status: DC

## 2011-08-09 NOTE — Progress Notes (Signed)
   CARDIOTHORACIC SURGERY PROGRESS NOTE  6 Days Post-Op  S/P Procedure(s) (LRB): MINIMALLY INVASIVE MITRAL VALVE REPAIR (MVR) (Right)  Subjective: Feels well except still marginal appetite.  Eating enough but not great.  Otherwise looks and feels well  Objective: Vital signs in last 24 hours: Temp:  [98 F (36.7 C)-98.5 F (36.9 C)] 98.3 F (36.8 C) (12/24 0546) Pulse Rate:  [74-78] 76  (12/24 0546) Cardiac Rhythm:  [-]  Resp:  [16-18] 16  (12/24 0546) BP: (97-107)/(56-67) 99/63 mmHg (12/24 0546) SpO2:  [90 %-94 %] 92 % (12/24 0546) Weight:  [71.6 kg (157 lb 13.6 oz)] 157 lb 13.6 oz (71.6 kg) (12/24 0339)  Physical Exam:  Rhythm:   sinus  Breath sounds: clear  Heart sounds:  RRR no murmur  Incisions:  Clean and dry  Abdomen:  soft  Extremities:  Warm, no edema   Intake/Output from previous day: 12/23 0701 - 12/24 0700 In: 840 [P.O.:840] Out: 1000 [Urine:1000] Intake/Output this shift:    Lab Results: No results found for this basename: WBC:2,HGB:2,HCT:2,PLT:2 in the last 72 hours BMET: No results found for this basename: NA:2,K:2,CL:2,CO2:2,GLUCOSE:2,BUN:2,CREATININE:2,CALCIUM:2 in the last 72 hours  CBG (last 3)  No results found for this basename: GLUCAP:3 in the last 72 hours PT/INR:   Basename 08/09/11 0615  LABPROT 16.5*  INR 1.31    CXR:  N/A  Assessment/Plan: S/P Procedure(s) (LRB): MINIMALLY INVASIVE MITRAL VALVE REPAIR (MVR) (Right)  Doing well D/C home today Coumadin 5 mg daily Stop lasix, KCl  [name],[name] 08/09/2011 8:35 AM

## 2011-08-09 NOTE — Discharge Summary (Signed)
I agree with the above discharge summary and plan for follow-up.  Alba Kriesel H  

## 2011-08-09 NOTE — Progress Notes (Signed)
Patient up and ambulating in room on room air with no apparent difficulty.  Patient ambulated on room air 293ft X2 on unit. 02 sats 94-96%. Patient tolerated well.  Will continue to monitor patient

## 2011-08-19 ENCOUNTER — Other Ambulatory Visit: Payer: Self-pay | Admitting: Thoracic Surgery (Cardiothoracic Vascular Surgery)

## 2011-08-19 DIAGNOSIS — I059 Rheumatic mitral valve disease, unspecified: Secondary | ICD-10-CM

## 2011-08-23 ENCOUNTER — Ambulatory Visit
Admission: RE | Admit: 2011-08-23 | Discharge: 2011-08-23 | Disposition: A | Discharge status: Home / Self Care | Payer: BC Managed Care – PPO | Source: Ambulatory Visit | Attending: Thoracic Surgery (Cardiothoracic Vascular Surgery) | Admitting: Thoracic Surgery (Cardiothoracic Vascular Surgery)

## 2011-08-23 ENCOUNTER — Encounter: Payer: Self-pay | Admitting: Thoracic Surgery (Cardiothoracic Vascular Surgery)

## 2011-08-23 ENCOUNTER — Ambulatory Visit (INDEPENDENT_AMBULATORY_CARE_PROVIDER_SITE_OTHER): Payer: Self-pay | Admitting: Thoracic Surgery (Cardiothoracic Vascular Surgery)

## 2011-08-23 VITALS — BP 109/67 | HR 64 | Resp 18 | Ht 69.0 in | Wt 160.0 lb

## 2011-08-23 DIAGNOSIS — Z9889 Other specified postprocedural states: Secondary | ICD-10-CM | POA: Insufficient documentation

## 2011-08-23 DIAGNOSIS — I059 Rheumatic mitral valve disease, unspecified: Secondary | ICD-10-CM

## 2011-08-23 DIAGNOSIS — E785 Hyperlipidemia, unspecified: Secondary | ICD-10-CM | POA: Diagnosis not present

## 2011-08-23 DIAGNOSIS — I340 Nonrheumatic mitral (valve) insufficiency: Secondary | ICD-10-CM

## 2011-08-23 MED ORDER — AMIODARONE HCL 200 MG PO TABS: 200.0000 mg | ORAL_TABLET | Freq: Every day | ORAL | Status: DC

## 2011-08-23 NOTE — Progress Notes (Signed)
[address]            [place] [address]          [phone]     CARDIOTHORACIC SURGERY OFFICE NOTE  Referring Provider is Armanda Magic, MD PCP is Lillia Mountain, MD, MD   HPI:  Patient returns for follow-up status post mitral valve repair on 08/03/2011. The patient's postoperative recovery has been entirely uncomplicated. Since hospital discharge he has had his prothrombin time checked and it was monitored through the [facility] cardiology clinic. He returns for office for routine followup today. The patient reports overall doing exceptionally well. He has mild residual soreness in his chest and now only occasionally takes one pain pill. He has no shortness of breath. He denies any fevers or chills. He has not been coughing at all. His appetite is quite good. His activity level is good. He states that last week he went shopping with his wife and spent a full day shopping. The next day he was exhausted but overall he has not had any problems in his breathing tolerance is better than it was prior to surgery. The remainder of his review of systems is unremarkable.   Current Outpatient Prescriptions  Medication Sig Dispense Refill  . albuterol (VENTOLIN HFA) 108 (90 BASE) MCG/ACT inhaler Inhale 2 puffs into the lungs every 4 (four) hours as needed for wheezing.  1 Inhaler  6  . amiodarone (PACERONE) 200 MG tablet Take 1 tablet (200 mg total) by mouth daily.  60 tablet  1  . aspirin 81 MG tablet Take 81 mg by mouth daily.        . Biotin 1000 MCG tablet Take 1,000 mcg by mouth daily.        . calcium carbonate (TUMS EX) 750 MG chewable tablet Chew 1 tablet by mouth as needed. For gas and reflux.      . Calcium Citrate-Vitamin D (CALCIUM CITRATE + PO) Take 1 tablet by mouth daily.        . Cholecalciferol (VITAMIN D3) 1000 UNITS CAPS Take 1,000 Units by mouth 2 (two) times daily.       . clonazePAM (KLONOPIN) 0.5 MG tablet Take 0.5 mg by mouth at bedtime as  needed. For sleep.      . Lutein 6 MG CAPS Take 1 capsule by mouth daily.       . metoprolol tartrate (LOPRESSOR) 12.5 mg TABS Take 0.5 tablets (12.5 mg total) by mouth 2 (two) times daily.  30 tablet  1  . Multiple Vitamins-Minerals (MULTIVITAMINS THER. W/MINERALS) TABS Take 1 tablet by mouth daily.        Marland Kitchen oxycodone (OXY-IR) 5 MG capsule Take 5 mg by mouth every 4 (four) hours as needed.        Bertram Gala Glycol-Propyl Glycol (SYSTANE OP) Apply 1 drop to eye 2 (two) times daily.        . predniSONE (DELTASONE) 5 MG tablet Take 5 mg by mouth daily.       . simvastatin (ZOCOR) 40 MG tablet Take 40 mg by mouth daily.        Marland Kitchen tiotropium (SPIRIVA) 18 MCG inhalation capsule Place 1 capsule (18 mcg total) into inhaler and inhale daily.  30 capsule  6  . warfarin (COUMADIN) 5 MG tablet Take 1 tablet (5 mg total) by mouth daily at 6 PM.  60 tablet  1  No current facility-administered medications for this visit.   Facility-Administered Medications Ordered in Other Visits  Medication Dose Route Frequency Provider Last Rate Last Dose  . chlorhexidine (HIBICLENS) 4 % liquid 2 application  30 mL Topical UD Purcell Nails, MD          Physical Exam:   BP 109/67  Pulse 64  Resp 18  Ht 5\' 9"  (1.753 m)  Wt 160 lb (72.576 kg)  BMI 23.63 kg/m2  SpO2 94%  General:  Well-appearing male who looks quite good in no distress  Chest:   Clear to auscultation with symmetrical breath sounds bilaterally. All the surgical incisions have healed nicely. Chest tube sutures are removed  CV:   Regular rate and rhythm with no murmurs  Abdomen:  Soft and nontender  Extremities:  Warm and well-perfused. There is no lower extremity edema. Groin incision is healing nicely.  Diagnostic Tests:  Chest x-ray performed today demonstrates clear lung fields bilaterally. There are no significant pleural effusions. No other abnormalities are noted.   Impression:  Excellent progress following recent mitral valve  repair.  Plan:  I've encouraged the patient to continue to gradually increase his physical activity as tolerated with his only limitation remaining that he refrain from heavy lifting or strenuous use of his arms or shoulders. I think that within the next week or so he probably could resume driving an automobile if he chooses to, so long as he is not having any significant pain that requires oral narcotic pain relievers. I've encouraged him to get involved in the cardiac rehabilitation program. I've instructed him to cut his dose of amiodarone back to 200 mg daily and to stop taking it completely and when his current prescription runs out. All of his questions been addressed. We will plan to see him back in 4 weeks for routine followup.   Salvatore Decent. Cornelius Moras, MD 08/23/2011 2:42 PM

## 2011-08-23 NOTE — Patient Instructions (Signed)
Decrease amiodarone to 200 mg daily until the current prescription runs out, then d/c completely.  The patient has been advised to continue to gradually increase their physical activity as tolerated.  They should refrain from any heavy lifting or strenuous use of their arms and shoulders until at least 8 weeks from the time of their surgery, and they should avoid activities that cause increased pain in their chest on the side of their surgical incision. The patient has been instructed that they may return driving an automobile in one week as long as they are no longer requiring oral narcotic pain relievers during the daytime.  They have been advised to start driving short distances during the daylight and gradually increase from there as they feel comfortable.

## 2011-08-27 ENCOUNTER — Other Ambulatory Visit: Payer: Self-pay

## 2011-08-27 DIAGNOSIS — Z9889 Other specified postprocedural states: Secondary | ICD-10-CM

## 2011-08-27 DIAGNOSIS — G8918 Other acute postprocedural pain: Secondary | ICD-10-CM

## 2011-08-27 MED ORDER — HYDROCODONE-ACETAMINOPHEN 5-500 MG PO TABS: 1.0000 | ORAL_TABLET | ORAL | Status: DC | PRN

## 2011-09-06 DIAGNOSIS — Z7901 Long term (current) use of anticoagulants: Secondary | ICD-10-CM | POA: Diagnosis not present

## 2011-09-20 ENCOUNTER — Ambulatory Visit (INDEPENDENT_AMBULATORY_CARE_PROVIDER_SITE_OTHER): Payer: Self-pay | Admitting: Thoracic Surgery (Cardiothoracic Vascular Surgery)

## 2011-09-20 ENCOUNTER — Encounter: Payer: Self-pay | Admitting: Thoracic Surgery (Cardiothoracic Vascular Surgery)

## 2011-09-20 VITALS — BP 106/65 | HR 64 | Resp 20 | Ht 69.0 in | Wt 160.0 lb

## 2011-09-20 DIAGNOSIS — Z9889 Other specified postprocedural states: Secondary | ICD-10-CM

## 2011-09-20 DIAGNOSIS — I059 Rheumatic mitral valve disease, unspecified: Secondary | ICD-10-CM

## 2011-09-20 NOTE — Patient Instructions (Signed)
Stop amiodarone.  May stop coumadin in 4 weeks if rhythm stable and start aspirin at that time.

## 2011-09-20 NOTE — Progress Notes (Signed)
[address]            [place] [address]          [phone]     CARDIOTHORACIC SURGERY OFFICE NOTE  Referring Provider is Armanda Magic, MD PCP is Lillia Mountain, MD, MD   HPI:  Patient returns for follow-up status post minimally invasive mitral valve repair. He was last seen here in the office On 08/23/2011. If any continues to do very well. He states that his breathing is quite good. He has not had any tachypalpitations. Soreness in his right chest has for the most part resolved and he has only mild residual pain. Overall he is doing well and has no complaints. He did decide not to participate in the cardiac rehabilitation program but he reports that he is walking a great deal and also using his stationary bicycle at home.   Current Outpatient Prescriptions  Medication Sig Dispense Refill  . albuterol (VENTOLIN HFA) 108 (90 BASE) MCG/ACT inhaler Inhale 2 puffs into the lungs every 4 (four) hours as needed for wheezing.  1 Inhaler  6  . amiodarone (PACERONE) 200 MG tablet Take 1 tablet (200 mg total) by mouth daily.  60 tablet  1  . aspirin 81 MG tablet Take 81 mg by mouth daily.        . Biotin 1000 MCG tablet Take 1,000 mcg by mouth daily.        . calcium carbonate (TUMS EX) 750 MG chewable tablet Chew 1 tablet by mouth as needed. For gas and reflux.      . Calcium Citrate-Vitamin D (CALCIUM CITRATE + PO) Take 1 tablet by mouth daily.        . Cholecalciferol (VITAMIN D3) 1000 UNITS CAPS Take 1,000 Units by mouth 2 (two) times daily.       . clonazePAM (KLONOPIN) 0.5 MG tablet Take 0.5 mg by mouth at bedtime as needed. For sleep.      Marland Kitchen HYDROcodone-acetaminophen (VICODIN) 5-500 MG per tablet Take 1 tablet by mouth every 4 (four) hours as needed for pain.  40 tablet  0  . Lutein 6 MG CAPS Take 1 capsule by mouth daily.       . metoprolol tartrate (LOPRESSOR) 12.5 mg TABS Take 0.5 tablets (12.5 mg total) by mouth 2 (two) times daily.  30  tablet  1  . Multiple Vitamins-Minerals (MULTIVITAMINS THER. W/MINERALS) TABS Take 1 tablet by mouth daily.        Bertram Gala Glycol-Propyl Glycol (SYSTANE OP) Apply 1 drop to eye 2 (two) times daily.        . predniSONE (DELTASONE) 5 MG tablet Take 5 mg by mouth daily.       . simvastatin (ZOCOR) 40 MG tablet Take 20 mg by mouth daily.       Marland Kitchen tiotropium (SPIRIVA) 18 MCG inhalation capsule Place 1 capsule (18 mcg total) into inhaler and inhale daily.  30 capsule  6  . warfarin (COUMADIN) 5 MG tablet Take 1 tablet (5 mg total) by mouth daily at 6 PM.  60 tablet  1   No current facility-administered medications for this visit.   Facility-Administered Medications Ordered in Other Visits  Medication Dose Route Frequency Provider Last Rate Last Dose  . chlorhexidine (HIBICLENS) 4 % liquid 2 application  30 mL Topical UD Purcell Nails, MD  Physical Exam:   BP 106/65  Pulse 64  Resp 20  Ht 5\' 9"  (1.753 m)  Wt 160 lb (72.576 kg)  BMI 23.63 kg/m2  SpO2 94%  General:  Well-appearing  Chest:   Clear to auscultation. Her minithoracotomy incision is healed nicely  CV:   Regular rate and rhythm without murmur  Abdomen:  Soft and nontender  Extremities:  Warm and well-perfused  Diagnostic Tests:  n/a   Impression:  Patient is doing well nearly 2 months following mitral valve repair.  Plan:  I think it would be reasonable to discontinue amiodarone at this time. I would recommend continuing Coumadin for another 4 weeks or so. I've encouraged Mr. Rayburn to continue to increase his physical activity as tolerated but continued to be careful not to lift more than 25 pounds in total weight. All of his questions been addressed. We'll plan to see him back in a proximally 4 months. Some point we would like to see a followup echocardiogram to reassess left ventricular function status post mitral valve repair.   Salvatore Decent. Cornelius Moras, MD 09/20/2011 4:30 PM

## 2011-09-23 DIAGNOSIS — K1120 Sialoadenitis, unspecified: Secondary | ICD-10-CM | POA: Diagnosis not present

## 2011-09-27 DIAGNOSIS — Z7901 Long term (current) use of anticoagulants: Secondary | ICD-10-CM | POA: Diagnosis not present

## 2011-09-30 DIAGNOSIS — K1120 Sialoadenitis, unspecified: Secondary | ICD-10-CM | POA: Diagnosis not present

## 2011-10-04 ENCOUNTER — Other Ambulatory Visit: Payer: Self-pay | Admitting: Surgical

## 2011-10-06 DIAGNOSIS — Z7901 Long term (current) use of anticoagulants: Secondary | ICD-10-CM | POA: Diagnosis not present

## 2011-10-13 DIAGNOSIS — I059 Rheumatic mitral valve disease, unspecified: Secondary | ICD-10-CM | POA: Diagnosis not present

## 2011-10-21 ENCOUNTER — Other Ambulatory Visit: Payer: Self-pay | Admitting: Surgical

## 2011-10-28 DIAGNOSIS — Z7901 Long term (current) use of anticoagulants: Secondary | ICD-10-CM | POA: Diagnosis not present

## 2011-11-23 DIAGNOSIS — L219 Seborrheic dermatitis, unspecified: Secondary | ICD-10-CM | POA: Diagnosis not present

## 2011-11-23 DIAGNOSIS — D235 Other benign neoplasm of skin of trunk: Secondary | ICD-10-CM | POA: Diagnosis not present

## 2011-11-29 ENCOUNTER — Other Ambulatory Visit: Payer: Self-pay | Admitting: Surgical

## 2012-01-03 DIAGNOSIS — I10 Essential (primary) hypertension: Secondary | ICD-10-CM | POA: Diagnosis not present

## 2012-01-03 DIAGNOSIS — I059 Rheumatic mitral valve disease, unspecified: Secondary | ICD-10-CM | POA: Diagnosis not present

## 2012-01-11 ENCOUNTER — Other Ambulatory Visit: Payer: Self-pay | Admitting: Critical Care Medicine

## 2012-01-13 ENCOUNTER — Encounter (HOSPITAL_COMMUNITY): Payer: Self-pay

## 2012-01-13 ENCOUNTER — Encounter (HOSPITAL_COMMUNITY)
Admission: RE | Disposition: A | Discharge status: Home / Self Care | Payer: Self-pay | Source: Ambulatory Visit | Attending: Gastroenterology

## 2012-01-13 ENCOUNTER — Ambulatory Visit (HOSPITAL_COMMUNITY)
Admission: RE | Admit: 2012-01-13 | Discharge: 2012-01-13 | Disposition: A | Discharge status: Home / Self Care | Payer: BC Managed Care – PPO | Source: Ambulatory Visit | Attending: Gastroenterology | Admitting: Gastroenterology

## 2012-01-13 DIAGNOSIS — E7800 Pure hypercholesterolemia, unspecified: Secondary | ICD-10-CM | POA: Insufficient documentation

## 2012-01-13 DIAGNOSIS — K220 Achalasia of cardia: Secondary | ICD-10-CM | POA: Insufficient documentation

## 2012-01-13 DIAGNOSIS — J4489 Other specified chronic obstructive pulmonary disease: Secondary | ICD-10-CM | POA: Insufficient documentation

## 2012-01-13 DIAGNOSIS — J449 Chronic obstructive pulmonary disease, unspecified: Secondary | ICD-10-CM | POA: Insufficient documentation

## 2012-01-13 DIAGNOSIS — Z8673 Personal history of transient ischemic attack (TIA), and cerebral infarction without residual deficits: Secondary | ICD-10-CM | POA: Insufficient documentation

## 2012-01-13 DIAGNOSIS — M899 Disorder of bone, unspecified: Secondary | ICD-10-CM | POA: Insufficient documentation

## 2012-01-13 DIAGNOSIS — Z79899 Other long term (current) drug therapy: Secondary | ICD-10-CM | POA: Insufficient documentation

## 2012-01-13 HISTORY — PX: ESOPHAGOGASTRODUODENOSCOPY: SHX5428

## 2012-01-13 SURGERY — EGD (ESOPHAGOGASTRODUODENOSCOPY)
Anesthesia: Moderate Sedation

## 2012-01-13 MED ORDER — DIPHENHYDRAMINE HCL 50 MG/ML IJ SOLN
INTRAMUSCULAR | Status: AC
  Filled 2012-01-13: qty 1

## 2012-01-13 MED ORDER — MIDAZOLAM HCL 10 MG/2ML IJ SOLN
INTRAMUSCULAR | Status: AC
  Filled 2012-01-13: qty 4

## 2012-01-13 MED ORDER — FENTANYL CITRATE 0.05 MG/ML IJ SOLN
INTRAMUSCULAR | Status: AC
  Filled 2012-01-13: qty 4

## 2012-01-13 MED ORDER — SODIUM CHLORIDE 0.9 % IV SOLN
Freq: Once | INTRAVENOUS | Status: AC
  Administered 2012-01-13: 500 mL via INTRAVENOUS

## 2012-01-13 MED ORDER — SODIUM CHLORIDE 0.9 % IV SOLN: Freq: Once | INTRAVENOUS | Status: DC

## 2012-01-13 MED ORDER — MIDAZOLAM HCL 10 MG/2ML IJ SOLN
INTRAMUSCULAR | Status: DC | PRN
  Administered 2012-01-13 (×3): 2.5 mg via INTRAVENOUS

## 2012-01-13 MED ORDER — ONABOTULINUMTOXINA 100 UNITS IJ SOLR
INTRAMUSCULAR | Status: DC | PRN
  Administered 2012-01-13: 100 [IU] via INTRAMUSCULAR

## 2012-01-13 MED ORDER — SODIUM CHLORIDE 0.9 % IJ SOLN
100.0000 [IU] | Freq: Once | INTRAMUSCULAR | Status: DC
  Filled 2012-01-13: qty 100

## 2012-01-13 MED ORDER — BUTAMBEN-TETRACAINE-BENZOCAINE 2-2-14 % EX AERO
INHALATION_SPRAY | CUTANEOUS | Status: DC | PRN
  Administered 2012-01-13: 2 via TOPICAL

## 2012-01-13 MED ORDER — FENTANYL NICU IV SYRINGE 50 MCG/ML
INJECTION | INTRAMUSCULAR | Status: DC | PRN
  Administered 2012-01-13: 25 ug via INTRAVENOUS
  Administered 2012-01-13: 50 ug via INTRAVENOUS

## 2012-01-13 NOTE — Discharge Instructions (Signed)
Endoscopy Care After Please read the instructions outlined below and refer to this sheet in the next few weeks. These discharge instructions provide you with general information on caring for yourself after you leave the hospital. Your doctor may also give you specific instructions. While your treatment has been planned according to the most current medical practices available, unavoidable complications occasionally occur. If you have any problems or questions after discharge, please call your doctor. HOME CARE INSTRUCTIONS Activity  You may resume your regular activity but move at a slower pace for the next 24 hours.   Take frequent rest periods for the next 24 hours.   Walking will help expel (get rid of) the air and reduce the bloated feeling in your abdomen.   No driving for 24 hours (because of the anesthesia (medicine) used during the test).   You may shower.   Do not sign any important legal documents or operate any machinery for 24 hours (because of the anesthesia used during the test).  Nutrition  Drink plenty of fluids.   You may resume your normal diet.   Begin with a light meal and progress to your normal diet.   Avoid alcoholic beverages for 24 hours or as instructed by your caregiver.  Medications You may resume your normal medications unless your caregiver tells you otherwise. What you can expect today  You may experience abdominal discomfort such as a feeling of fullness or "gas" pains.   You may experience a sore throat for 2 to 3 days. This is normal. Gargling with salt water may help this.  Follow-up Your doctor will discuss the results of your test with you. SEEK IMMEDIATE MEDICAL CARE IF:  You have excessive nausea (feeling sick to your stomach) and/or vomiting.   You have severe abdominal pain and distention (swelling).   You have trouble swallowing.   You have a temperature over 100 F (37.8 C).   You have rectal bleeding or vomiting of blood.    Document Released: 03/16/2004 Document Revised: 07/22/2011 Document Reviewed: 09/27/2007 [employer] Patient Information 2012 [employer]. 

## 2012-01-13 NOTE — Op Note (Signed)
Procedure: Esophagogastroduodenoscopy with Botox injection into the lower esophageal sphincter muscle.  Endoscopist: Danise Edge  Premedication: Versed 7.5 mg. Fentanyl 75 mcg.  Procedure: The patient was placed in the left lateral decubitus position. Pentax gastroscope was passed through the posterior hypopharynx into the proximal esophagus without difficulty. Hypopharynx, larynx, and cords appeared.  Esophagoscopy. The proximal, mid, and lower segments of the esophageal mucosa. The squamocolumnar junction was noted at approximately 40 cm from the incisor teeth. There is no endoscopic evidence for the presence of erosive esophagitis, Barrett's esophagus, or esophageal stricture formation. A total of 100 units of Botox was injected into the lower esophageal sphincter( four-quadrant injections using 5 units of Botox).  Gastroscopy: Retroflex view of the gastric cardia and fundus was normal. The gastric body, antrum, and pylorus appeared normal.  Duodenoscopy: The bulb and descending duodenum appeared normal.  Assessment: Achalasia associated with a normal esophagogastroduodenoscopy Botox injected into the lower esophageal sphincter muscles (100 units).

## 2012-01-13 NOTE — Progress Notes (Signed)
Chris Lewis in Endoscopy for OP procedure. He no longer has chest tube. DC'd  In active LDA's

## 2012-01-13 NOTE — H&P (Signed)
Problem: Achalasia.  History: The patient is a 71 year old male born 11-24-1940. The patient has chronic achalasia and has received Botox injections since December 2008. He is scheduled to undergo esophagogastroduodenoscopy with Botox injection into the lower esophageal sphincter muscle.  Allergies: Lipitor causes muscle aches. Iodinated contrast dye.  Chronic medications: Calcium. Vitamin D. Aspirin. MetroGel. Simvastatin. Albuterol. Spiriva. Clonazepam. Fluticasone.Lutein.  Past medical and surgical history: Achalasia. Hypercholesterolemia. Chronic obstructive pulmonary disease. Anxiety. Seasonal allergic rhinitis. Transient ischemic attack in 2005. Mitral valve regurgitation. Osteopenia. Cholecystectomy. Right inguinal herniorrhaphy back surgery. Eyebrow surgery.  Plan: Proceed with diagnostic esophagogastroduodenoscopy.

## 2012-01-14 ENCOUNTER — Encounter (HOSPITAL_COMMUNITY): Payer: Self-pay

## 2012-01-14 ENCOUNTER — Encounter (HOSPITAL_COMMUNITY): Payer: Self-pay | Admitting: Gastroenterology

## 2012-01-19 ENCOUNTER — Telehealth: Payer: Self-pay | Admitting: Critical Care Medicine

## 2012-01-19 ENCOUNTER — Other Ambulatory Visit: Payer: Self-pay | Admitting: Critical Care Medicine

## 2012-01-19 NOTE — Telephone Encounter (Signed)
I spoke with the pt and he is c/o feeling like he has phlegm in his chest but he is having trouble bringing it up. He states when he does it is clear. He denies any increased SOB, and he states he is not coughing more, just feels like there is phlegm in his chest. I advised the pt to try mucinex OTC with plenty of fluids. I advised if he does not see any improvement or if he does cough up phlegm and it has a color to call. Pt states understanding.Carron Curie, CMA

## 2012-01-24 ENCOUNTER — Ambulatory Visit (INDEPENDENT_AMBULATORY_CARE_PROVIDER_SITE_OTHER): Payer: BC Managed Care – PPO | Admitting: Thoracic Surgery (Cardiothoracic Vascular Surgery)

## 2012-01-24 ENCOUNTER — Encounter: Payer: Self-pay | Admitting: Thoracic Surgery (Cardiothoracic Vascular Surgery)

## 2012-01-24 VITALS — BP 129/73 | HR 88 | Resp 20 | Ht 69.0 in | Wt 167.0 lb

## 2012-01-24 DIAGNOSIS — Z9889 Other specified postprocedural states: Secondary | ICD-10-CM

## 2012-01-24 DIAGNOSIS — I059 Rheumatic mitral valve disease, unspecified: Secondary | ICD-10-CM | POA: Diagnosis not present

## 2012-01-24 NOTE — Progress Notes (Signed)
[address]            [place] [address]          [phone]     CARDIOTHORACIC SURGERY OFFICE NOTE  Referring Provider is Armanda Magic, MD PCP is Lillia Mountain, MD, MD   HPI:  Patient returns for routine followup 6 months status post minimally invasive mitral valve repair.  He was last seen here in the office on 09/20/2011. Since then he has continued to do very well. He underwent a followup echocardiogram 09/28/2011 at [facility] cardiology. By report the mitral valve repair looked good with no significant residual mitral regurgitation. Left ventricular function was normal. There is mild tricuspid regurgitation. 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 Since then the patient has continued to do well from a cardiovascular standpoint. He has returned to essentially normal physical activity and is limited primarily by problems related to his back. He still suffers from achalasia and he had to go back and have Botox injection by Dr. Laural Benes, although he notes that this time it took longer for his symptoms of dysphagia to return. Otherwise he has no complaints.   Current Outpatient Prescriptions  Medication Sig Dispense Refill  . aspirin 81 MG tablet Take 81 mg by mouth daily.        . Biotin 1000 MCG tablet Take 1,000 mcg by mouth daily.        . calcium carbonate (TUMS EX) 750 MG chewable tablet Chew 1 tablet by mouth as needed. For gas and reflux.      . Calcium Citrate-Vitamin D (CALCIUM CITRATE + PO) Take 1 tablet by mouth daily.        . Cholecalciferol (VITAMIN D3) 1000 UNITS CAPS Take 1,000 Units by mouth 2 (two) times daily.       . clonazePAM (KLONOPIN) 0.5 MG tablet Take 0.5 mg by mouth at bedtime as needed. For sleep.      . Lutein 6 MG CAPS Take 1 capsule by mouth daily.       . Multiple Vitamins-Minerals (MULTIVITAMINS THER. W/MINERALS) TABS Take 1 tablet by mouth daily.        Bertram Gala  Glycol-Propyl Glycol (SYSTANE OP) Apply 1 drop to eye 2 (two) times daily.        . simvastatin (ZOCOR) 40 MG tablet Take 20 mg by mouth daily.       Marland Kitchen SPIRIVA HANDIHALER 18 MCG inhalation capsule INHALE 1 CAPSULE VIA HANDIHALER ONCE DAILY AT THE SAME TIME EVERY DAY  30 each  2  . VENTOLIN HFA 108 (90 BASE) MCG/ACT inhaler INHALE 2 PUFFS INTO THE LUNGS EVERY 4 (FOUR) HOURS AS NEEDED FOR WHEEZING.  1 Inhaler  1   No current facility-administered medications for this visit.   Facility-Administered Medications Ordered in Other Visits  Medication Dose Route Frequency Provider Last Rate Last Dose  . chlorhexidine (HIBICLENS) 4 % liquid 2 application  30 mL Topical UD Purcell Nails, MD          Physical Exam:   BP 129/73  Pulse 88  Resp 20  Ht 5\' 9"  (1.753 m)  Wt 167 lb (75.751 kg)  BMI 24.66 kg/m2  SpO2 95%  General:  Well-appearing  Chest:   Clear to auscultation  CV:   Regular rate and rhythm without murmur  Incisions:  Completely healed  Abdomen:  Soft and nontender  Extremities:  Warm and well-perfused  Diagnostic Tests:  Report of transthoracic echocardiogram performed 09/23/2011 at [facility] is reviewed. By report there was normal left ventricular systolic function with previous mitral valve repair and no residual mitral regurgitation, mild tricuspid regurgitation, grade 2 diastolic dysfunction   Impression:  Excellent result now 6 months following minimally invasive mitral valve repair   Plan:  Patient will call and return to see [place] as needed in the future.   Salvatore Decent. Cornelius Moras, MD 01/24/2012 12:08 PM

## 2012-03-02 ENCOUNTER — Other Ambulatory Visit: Payer: Self-pay | Admitting: Critical Care Medicine

## 2012-03-08 ENCOUNTER — Ambulatory Visit (INDEPENDENT_AMBULATORY_CARE_PROVIDER_SITE_OTHER): Payer: BC Managed Care – PPO | Admitting: Critical Care Medicine

## 2012-03-08 ENCOUNTER — Encounter: Payer: Self-pay | Admitting: Critical Care Medicine

## 2012-03-08 VITALS — BP 112/68 | HR 75 | Temp 97.6°F | Ht 69.0 in | Wt 169.8 lb

## 2012-03-08 DIAGNOSIS — J449 Chronic obstructive pulmonary disease, unspecified: Secondary | ICD-10-CM | POA: Diagnosis not present

## 2012-03-08 MED ORDER — ALBUTEROL SULFATE HFA 108 (90 BASE) MCG/ACT IN AERS: 2.0000 | INHALATION_SPRAY | RESPIRATORY_TRACT | Status: DC | PRN

## 2012-03-08 NOTE — Progress Notes (Signed)
Subjective:    Patient ID: Chris Lewis, male    DOB: [birth year], 71 y.o.   MRN: [id number]  HPI This is a 71 year old man with COPD  July 15, 2010 2:28 PM  Was getting better, then awoke 4am with diarrhea and hurt all over. Wife is ill as well.  Also other family members with same illness. Was ok prior to this.  Called in on 11/23 and rx pred and avelox and this helped. Notes dry eyes.  Just had botox injection of esophagus on 07/02/10 Dysphagia is better but heartburn is worse  September 15, 2010 1:50 PM  Is better vs 11/11. First few weeks of Jan was ok until then with the wind and cool air and got dyspneic and awoke congestion. Yellow and green mucus is now productive.  Pt remains dyspneic. Using botox injections on esophagus. Not as effective as before, last rx 12/11.   06/02/2011 Not seen since 1/12.  Noting more cough and congestion. Noting on Echo with Mitral valve regurge.  05/18/11: R/L heart cath>>>not as bad MR.  Saw Dr Cornelius Moras,  For a CT scan of thorax for check.  ??needs a repair. Notes more clear mucus , notes sl pndrip.  Notes some dyspnea with exertion.   Pt denies any significant sore throat, nasal congestion or excess secretions, fever, chills, sweats, unintended weight loss, pleurtic or exertional chest pain, orthopnea PND, or leg swelling Pt denies any increase in rescue therapy over baseline, denies waking up needing it or having any early am or nocturnal exacerbations of coughing/wheezing/or dyspnea. Pt also denies any obvious fluctuation in symptoms with  weather or environmental change or other alleviating or aggravating factors   03/08/2012 Pt had open heart surgery with mitral valve regurgitation.  Dr Barry Dienes did the repair.  TEE showed the issues.  Repair of mitral valve and hole in the heart.  Limited approach and no sternotomy. Since this repair the dyspnea is better.  Pt is no longer coughing.  Occ has phlegm to release    Review of Systems 11 pt Ros taken and  neg except as per HPI    Objective:   Physical Exam  Filed Vitals:   03/08/12 1153  BP: 112/68  Pulse: 75  Temp: 97.6 F (36.4 C)  TempSrc: Oral  Height: 5\' 9"  (1.753 m)  Weight: 169 lb 12.8 oz (77.021 kg)  SpO2: 96%    Gen: Pleasant, well-nourished, in no distress,  normal affect  ENT: No lesions,  mouth clear,  oropharynx clear, no postnasal drip  Neck: No JVD, no TMG, no carotid bruits  Lungs: No use of accessory muscles, no dullness to percussion, distant bs  Cardiovascular: RRR, heart sounds normal, no murmur or gallops, no peripheral edema  Abdomen: soft and NT, no HSM,  BS normal  Musculoskeletal: No deformities, no cyanosis or clubbing  Neuro: alert, non focal  Skin: Warm, no lesions or rashes  No results found.       Assessment & Plan:   COPD Golds C Copd  Stable at this time Achalasia may be resulting in reflux induced mucus plugging Dyspnea much better with Mitral valve repair Plan Cont spiriva Use flutter valve more often   Updated Medication List Outpatient Encounter Prescriptions as of 03/08/2012  Medication Sig Dispense Refill  . albuterol (VENTOLIN HFA) 108 (90 BASE) MCG/ACT inhaler Inhale 2 puffs into the lungs every 4 (four) hours as needed for wheezing.  18 g  6  . aspirin 81 MG  tablet Take 81 mg by mouth daily.        . Biotin 1000 MCG tablet Take 1,000 mcg by mouth daily.        . calcium carbonate (TUMS EX) 750 MG chewable tablet Chew 1 tablet by mouth as needed. For gas and reflux.      . Calcium Citrate-Vitamin D (CALCIUM CITRATE + PO) Take 1 tablet by mouth daily.        . Cholecalciferol (VITAMIN D3) 1000 UNITS CAPS Take 1,000 Units by mouth 2 (two) times daily.       . clonazePAM (KLONOPIN) 0.5 MG tablet Take 0.5 mg by mouth at bedtime as needed. For sleep.      . Lutein 6 MG CAPS Take 1 capsule by mouth daily.       . metroNIDAZOLE (METROGEL) 1 % gel Apply 1 application topically daily. Apply thin layer to affected area  externally      . Multiple Vitamins-Minerals (MULTIVITAMINS THER. W/MINERALS) TABS Take 1 tablet by mouth daily.        Bertram Gala Glycol-Propyl Glycol (SYSTANE OP) Apply 1 drop to eye 2 (two) times daily.        Marland Kitchen SPIRIVA HANDIHALER 18 MCG inhalation capsule INHALE 1 CAPSULE VIA HANDIHALER ONCE DAILY AT THE SAME TIME EVERY DAY  30 each  2  . traMADol (ULTRAM) 50 MG tablet Take 1 tablet by mouth Every 6 hours as needed.      Marland Kitchen DISCONTD: VENTOLIN HFA 108 (90 BASE) MCG/ACT inhaler INHALE 2 PUFFS INTO THE LUNGS EVERY 4 (FOUR) HOURS AS NEEDED FOR WHEEZING.  18 g  1  . DISCONTD: simvastatin (ZOCOR) 40 MG tablet Take 20 mg by mouth daily.        Facility-Administered Encounter Medications as of 03/08/2012  Medication Dose Route Frequency Provider Last Rate Last Dose  . chlorhexidine (HIBICLENS) 4 % liquid 2 application  30 mL Topical UD Purcell Nails, MD

## 2012-03-08 NOTE — Patient Instructions (Addendum)
Use your flutter valve more often No other medication changes Return 6 months

## 2012-03-08 NOTE — Assessment & Plan Note (Signed)
Golds C Copd  Stable at this time Achalasia may be resulting in reflux induced mucus plugging Dyspnea much better with Mitral valve repair Plan Cont spiriva Use flutter valve more often

## 2012-03-09 DIAGNOSIS — Z1331 Encounter for screening for depression: Secondary | ICD-10-CM | POA: Diagnosis not present

## 2012-03-09 DIAGNOSIS — Z0000 Encounter for general adult medical examination without abnormal findings: Secondary | ICD-10-CM | POA: Diagnosis not present

## 2012-03-09 DIAGNOSIS — I059 Rheumatic mitral valve disease, unspecified: Secondary | ICD-10-CM | POA: Diagnosis not present

## 2012-03-09 DIAGNOSIS — E7800 Pure hypercholesterolemia, unspecified: Secondary | ICD-10-CM | POA: Diagnosis not present

## 2012-03-09 DIAGNOSIS — I10 Essential (primary) hypertension: Secondary | ICD-10-CM | POA: Diagnosis not present

## 2012-04-27 ENCOUNTER — Other Ambulatory Visit: Payer: Self-pay | Admitting: Thoracic Surgery (Cardiothoracic Vascular Surgery)

## 2012-04-28 ENCOUNTER — Other Ambulatory Visit: Payer: Self-pay | Admitting: Thoracic Surgery (Cardiothoracic Vascular Surgery)

## 2012-04-28 DIAGNOSIS — D381 Neoplasm of uncertain behavior of trachea, bronchus and lung: Secondary | ICD-10-CM

## 2012-05-02 DIAGNOSIS — Z23 Encounter for immunization: Secondary | ICD-10-CM | POA: Diagnosis not present

## 2012-05-16 ENCOUNTER — Other Ambulatory Visit: Payer: Self-pay | Admitting: Surgery

## 2012-05-16 DIAGNOSIS — L219 Seborrheic dermatitis, unspecified: Secondary | ICD-10-CM | POA: Diagnosis not present

## 2012-05-22 ENCOUNTER — Other Ambulatory Visit: Payer: Self-pay | Admitting: Critical Care Medicine

## 2012-06-01 ENCOUNTER — Other Ambulatory Visit: Payer: Self-pay | Admitting: Thoracic Surgery (Cardiothoracic Vascular Surgery)

## 2012-06-01 DIAGNOSIS — E785 Hyperlipidemia, unspecified: Secondary | ICD-10-CM | POA: Diagnosis not present

## 2012-06-02 LAB — BUN: BUN: 21 mg/dL (ref 6–23)

## 2012-06-02 LAB — CREATININE, SERUM: Creat: 1.18 mg/dL (ref 0.50–1.35)

## 2012-06-05 ENCOUNTER — Other Ambulatory Visit: Payer: Self-pay | Admitting: *Deleted

## 2012-06-05 ENCOUNTER — Other Ambulatory Visit: Payer: Self-pay | Admitting: Thoracic Surgery (Cardiothoracic Vascular Surgery)

## 2012-06-05 DIAGNOSIS — D381 Neoplasm of uncertain behavior of trachea, bronchus and lung: Secondary | ICD-10-CM

## 2012-06-05 NOTE — Telephone Encounter (Signed)
Encounter originated in error.

## 2012-06-06 ENCOUNTER — Inpatient Hospital Stay
Admission: RE | Admit: 2012-06-06 | Discharge: 2012-06-06 | Payer: BC Managed Care – PPO | Source: Ambulatory Visit | Attending: Thoracic Surgery (Cardiothoracic Vascular Surgery) | Admitting: Thoracic Surgery (Cardiothoracic Vascular Surgery)

## 2012-06-06 ENCOUNTER — Ambulatory Visit
Admission: RE | Admit: 2012-06-06 | Discharge: 2012-06-06 | Disposition: A | Discharge status: Home / Self Care | Payer: BC Managed Care – PPO | Source: Ambulatory Visit | Attending: Thoracic Surgery (Cardiothoracic Vascular Surgery) | Admitting: Thoracic Surgery (Cardiothoracic Vascular Surgery)

## 2012-06-06 DIAGNOSIS — D381 Neoplasm of uncertain behavior of trachea, bronchus and lung: Secondary | ICD-10-CM

## 2012-06-06 DIAGNOSIS — J438 Other emphysema: Secondary | ICD-10-CM | POA: Diagnosis not present

## 2012-06-06 MED ORDER — IOHEXOL 300 MG/ML  SOLN: 75.0000 mL | Freq: Once | INTRAMUSCULAR | Status: AC | PRN

## 2012-06-12 ENCOUNTER — Encounter: Payer: Self-pay | Admitting: Thoracic Surgery (Cardiothoracic Vascular Surgery)

## 2012-06-12 ENCOUNTER — Ambulatory Visit (INDEPENDENT_AMBULATORY_CARE_PROVIDER_SITE_OTHER): Payer: BC Managed Care – PPO | Admitting: Thoracic Surgery (Cardiothoracic Vascular Surgery)

## 2012-06-12 VITALS — BP 108/73 | HR 80 | Resp 18 | Ht 69.0 in | Wt 168.0 lb

## 2012-06-12 DIAGNOSIS — R911 Solitary pulmonary nodule: Secondary | ICD-10-CM

## 2012-06-12 DIAGNOSIS — Z9889 Other specified postprocedural states: Secondary | ICD-10-CM

## 2012-06-12 NOTE — Progress Notes (Signed)
[address]            [place] [address]          [phone]     CARDIOTHORACIC SURGERY OFFICE NOTE  Referring Provider is Quintella Reichert, MD PCP is Lillia Mountain, MD   HPI:  Patient returns to the office for followup of a small solitary lung nodule noticed on CT scan of the chest performed last year prior to elective mitral valve repair.  He was last seen here in our office for followup on 01/24/2012 after having undergone minimally invasive mitral valve repair on 08/03/2011. Over the last 4 months the patient has continued to do very well. He still struggles with his achalasia, and he thinks that he may need to have Botox injection performed again soon. He otherwise feels well and he specifically denies any problems with exertional shortness of breath.   Current Outpatient Prescriptions  Medication Sig Dispense Refill  . albuterol (VENTOLIN HFA) 108 (90 BASE) MCG/ACT inhaler Inhale 2 puffs into the lungs every 4 (four) hours as needed for wheezing.  18 g  6  . aspirin 81 MG tablet Take 81 mg by mouth daily.        . Biotin 1000 MCG tablet Take 1,000 mcg by mouth daily.        . calcium carbonate (OS-CAL) 1250 MG chewable tablet Chew 1 tablet by mouth daily.      . calcium carbonate (TUMS EX) 750 MG chewable tablet Chew 1 tablet by mouth as needed. For gas and reflux.      . Calcium Citrate-Vitamin D (CALCIUM CITRATE + PO) Take 1 tablet by mouth daily.        . Cholecalciferol (VITAMIN D3) 1000 UNITS CAPS Take 1,000 Units by mouth 2 (two) times daily.       . clonazePAM (KLONOPIN) 0.5 MG tablet Take 0.5 mg by mouth at bedtime as needed. For sleep.      . Lutein 6 MG CAPS Take 1 capsule by mouth daily.       . metroNIDAZOLE (METROGEL) 1 % gel Apply 1 application topically daily. Apply thin layer to affected area externally      . Multiple Vitamins-Minerals (MULTIVITAMINS THER. W/MINERALS) TABS Take 1 tablet by mouth daily.        Bertram Gala  Glycol-Propyl Glycol (SYSTANE OP) Apply 1 drop to eye 2 (two) times daily.        Marland Kitchen SPIRIVA HANDIHALER 18 MCG inhalation capsule INHALE 1 CAPSULE VIA HANDIHALER ONCE DAILY AT THE SAME TIME EVERY DAY  30 each  6  . traMADol (ULTRAM) 50 MG tablet Take 1 tablet by mouth Every 6 hours as needed.      . isosorbide dinitrate (ISORDIL) 10 MG tablet Take 10 mg by mouth 3 (three) times daily. Takes 1/2 tab, SL under tongue prior to each meal       No current facility-administered medications for this visit.   Facility-Administered Medications Ordered in Other Visits  Medication Dose Route Frequency Provider Last Rate Last Dose  . chlorhexidine (HIBICLENS) 4 % liquid 2 application  30 mL Topical UD Purcell Nails, MD          Physical Exam:   BP 108/73  Pulse 80  Resp 18  Ht 5\' 9"  (1.753 m)  Wt 168 lb (76.204 kg)  BMI 24.81 kg/m2  SpO2 94%  General:  Well-appearing  Chest:   Clear to auscultation with symmetrical breath sounds  CV:   Regular rate and rhythm without murmur  Incisions:  Completely healed  Abdomen:  Soft and nontender  Extremities:  Warm and well-perfused  Diagnostic Tests:  *RADIOLOGY REPORT*  Clinical Data: Follow up of pulmonary nodule. Ex-smoker quit in  1999. COPD. Mitral valve repair 12/12. No history of cancer.  CT CHEST WITH CONTRAST  Technique: Multidetector CT imaging of the chest was performed  following the standard protocol during bolus administration of  intravenous contrast.  Contrast: 75 ml Omnipaque-300  Comparison: Plain film of 08/23/2011. CT of 06/04/2011.  Findings: Lung windows demonstrate severe centrilobular emphysema.  No change in a perifissural right upper lobe nodule on image 37  which measures 4 mm and is most likely a subpleural lymph node.  Partially calcified pleural-based scarring anteriorly on the right;  image 50.  Calcified right lower lobe granuloma on image 40. No new or  enlarging nodules.  Soft tissue windows demonstrate  tortuous descending thoracic aorta.  Prior mitral valve repair. Mild LAD calcified coronary artery  atherosclerosis. No pericardial or pleural effusion. Normal heart  size. No mediastinal or hilar adenopathy. Moderately dilated  thoracic esophagus with air-fluid levels within. Similar.  Limited abdominal imaging demonstrates old granulomatous disease in  the liver. Underdistended proximal stomach. Prominent jejunal  mesenteric nodes with increased density in the mesenteric fat.  Incompletely imaged, but similar to 06/04/2011. Cholecystectomy.  IMPRESSION:  1. Marked centrilobular emphysema with a similar perifissural  right upper lobe 4 mm nodule. Favored to represent a subpleural  lymph node. Per consensus criteria, this does not require further  evaluation/follow-up. This recommendation follows the consensus  statement: Guidelines for Management of Small Pulmonary Nodules  Detected on CT Scans: A Statement from the Fleischner Society as  published in Radiology 2005; 237:395-400.  2. No acute process in the chest.  3. Esophageal air fluid level suggests dysmotility or  gastroesophageal reflux.  4. Incompletely imaged but likely similar increased density in the  jejunal mesentery with small nodes within. Favored to represent  mesenteric adenitis/panniculitis.  Original Report Authenticated By: Consuello Bossier, M.D.    Impression:  Stable benign appearing 4 mm nodule in the right upper lobe.  Plan:  The patient has been reassured that this nodular opacity appears entirely benign and no longer requires any long-term followup. The future he will call and return to see [place] only as needed.   Salvatore Decent. Cornelius Moras, MD 06/12/2012 3:27 PM

## 2012-06-12 NOTE — Patient Instructions (Signed)
Call should any further problems or questions arise

## 2012-06-27 ENCOUNTER — Encounter (HOSPITAL_COMMUNITY)
Admission: RE | Disposition: A | Discharge status: Home / Self Care | Payer: Self-pay | Source: Ambulatory Visit | Attending: Gastroenterology

## 2012-06-27 ENCOUNTER — Encounter (HOSPITAL_COMMUNITY): Payer: Self-pay

## 2012-06-27 ENCOUNTER — Encounter (HOSPITAL_COMMUNITY): Payer: Self-pay | Admitting: Gastroenterology

## 2012-06-27 ENCOUNTER — Ambulatory Visit (HOSPITAL_COMMUNITY)
Admission: RE | Admit: 2012-06-27 | Discharge: 2012-06-27 | Disposition: A | Discharge status: Home / Self Care | Payer: BC Managed Care – PPO | Source: Ambulatory Visit | Attending: Gastroenterology | Admitting: Gastroenterology

## 2012-06-27 DIAGNOSIS — J449 Chronic obstructive pulmonary disease, unspecified: Secondary | ICD-10-CM | POA: Insufficient documentation

## 2012-06-27 DIAGNOSIS — K220 Achalasia of cardia: Secondary | ICD-10-CM | POA: Insufficient documentation

## 2012-06-27 DIAGNOSIS — J4489 Other specified chronic obstructive pulmonary disease: Secondary | ICD-10-CM | POA: Insufficient documentation

## 2012-06-27 DIAGNOSIS — E7800 Pure hypercholesterolemia, unspecified: Secondary | ICD-10-CM | POA: Insufficient documentation

## 2012-06-27 DIAGNOSIS — Z8673 Personal history of transient ischemic attack (TIA), and cerebral infarction without residual deficits: Secondary | ICD-10-CM | POA: Insufficient documentation

## 2012-06-27 HISTORY — PX: ESOPHAGOGASTRODUODENOSCOPY: SHX5428

## 2012-06-27 HISTORY — PX: BOTOX INJECTION: SHX5754

## 2012-06-27 SURGERY — EGD (ESOPHAGOGASTRODUODENOSCOPY)
Anesthesia: Moderate Sedation

## 2012-06-27 MED ORDER — MIDAZOLAM HCL 10 MG/2ML IJ SOLN
INTRAMUSCULAR | Status: DC | PRN
  Administered 2012-06-27 (×4): 2.5 mg via INTRAVENOUS

## 2012-06-27 MED ORDER — MIDAZOLAM HCL 10 MG/2ML IJ SOLN
INTRAMUSCULAR | Status: AC
  Filled 2012-06-27: qty 2

## 2012-06-27 MED ORDER — ONABOTULINUMTOXINA 100 UNITS IJ SOLR
100.0000 [IU] | Freq: Once | INTRAMUSCULAR | Status: AC
  Administered 2012-06-27: 100 [IU] via INTRAMUSCULAR
  Filled 2012-06-27: qty 100

## 2012-06-27 MED ORDER — FENTANYL CITRATE 0.05 MG/ML IJ SOLN
INTRAMUSCULAR | Status: AC
  Filled 2012-06-27: qty 2

## 2012-06-27 MED ORDER — FENTANYL CITRATE 0.05 MG/ML IJ SOLN
INTRAMUSCULAR | Status: DC | PRN
  Administered 2012-06-27: 50 ug via INTRAVENOUS
  Administered 2012-06-27: 25 ug via INTRAVENOUS

## 2012-06-27 MED ORDER — BUTAMBEN-TETRACAINE-BENZOCAINE 2-2-14 % EX AERO
INHALATION_SPRAY | CUTANEOUS | Status: DC | PRN
  Administered 2012-06-27: 1 via TOPICAL

## 2012-06-27 MED ORDER — SODIUM CHLORIDE 0.9 % IV SOLN: INTRAVENOUS | Status: DC

## 2012-06-27 NOTE — H&P (Signed)
  Problem: Achalasia associated with dysphagia.  History: The patient is a 71 year old male born [birth year]. The patient has chronic achalasia treated with Botox injections. The patient is scheduled to undergo an esophagogastroduodenoscopy with Botox injection into the lower esophageal sphincter muscle today.  Allergies: Iodinated contrast dye. Statins.  Past medical and surgical history: Achalasia. Hypercholesterolemia. Chronic obstructive pulmonary disease. Chronic anxiety. Seasonal allergic rhinitis. Transient ischemic attack. Osteopenia. Cholecystectomy. Right inguinal hernia repair. Back surgeries. Foot surgery. Mitral valve repair.  Exam: The patient is alert and lying comfortably on the hospital stretcher. Lungs are clear to auscultation. Cardiac exam reveals a regular rhythm. Abdomen is soft, flat, and nontender to palpation in all quadrants.  Plan: Proceed with esophagogastroduodenoscopy with Botox injection into the lower esophageal sphincter muscle to treat chronic achalasia as scheduled.

## 2012-06-27 NOTE — Op Note (Signed)
Procedure: Diagnostic esophagogastroduodenoscopy with Botox injection into the lower esophageal sphincter muscle to treat chronic achalasia.  Endoscopist: Danise Edge  Premedication: Versed 10 mg intravenously. Fentanyl 75 mcg intravenously.  Procedure: The patient was placed in the left lateral decubitus position. The Pentax gastroscope was passed through the posterior hypopharynx into the proximal esophagus without difficulty. The hypopharynx, larynx, and vocal cords appeared normal.  Esophagoscopy: The proximal, mid, and lower segments of the esophageal mucosa appear normal. The squamocolumnar junction is noted at 45 cm from the incisor teeth. There is no endoscopic evidence for the presence of esophageal stricture formation or Barrett's esophagus. A total of 100 units of Botox was injected into the lower esophageal sphincter muscle at 45 cm from the incisor teeth. Four quadrant injections using 25 units per injection.  Gastroscopy: Retroflex view of the gastric cardia and fundus was normal. The gastric body, antrum, and pylorus appeared normal.  Duodenoscopy: The duodenal bulb and descending duodenum appear normal.  Assessment:  #1. Normal esophagogastroduodenoscopy.  #2. 100 units of Botox was injected into the lower esophageal sphincter muscle to treat chronic achalasia.

## 2012-06-28 ENCOUNTER — Encounter (HOSPITAL_COMMUNITY): Payer: Self-pay | Admitting: Gastroenterology

## 2012-06-30 DIAGNOSIS — L259 Unspecified contact dermatitis, unspecified cause: Secondary | ICD-10-CM | POA: Diagnosis not present

## 2012-06-30 DIAGNOSIS — Z85828 Personal history of other malignant neoplasm of skin: Secondary | ICD-10-CM | POA: Diagnosis not present

## 2012-08-23 DIAGNOSIS — J310 Chronic rhinitis: Secondary | ICD-10-CM | POA: Diagnosis not present

## 2012-09-07 DIAGNOSIS — I10 Essential (primary) hypertension: Secondary | ICD-10-CM | POA: Diagnosis not present

## 2012-09-07 DIAGNOSIS — I059 Rheumatic mitral valve disease, unspecified: Secondary | ICD-10-CM | POA: Diagnosis not present

## 2012-09-12 ENCOUNTER — Encounter: Payer: Self-pay | Admitting: Critical Care Medicine

## 2012-09-12 ENCOUNTER — Ambulatory Visit (INDEPENDENT_AMBULATORY_CARE_PROVIDER_SITE_OTHER): Payer: BC Managed Care – PPO | Admitting: Critical Care Medicine

## 2012-09-12 VITALS — BP 122/76 | HR 73 | Temp 97.3°F | Ht 69.0 in | Wt 164.4 lb

## 2012-09-12 DIAGNOSIS — J449 Chronic obstructive pulmonary disease, unspecified: Secondary | ICD-10-CM

## 2012-09-12 MED ORDER — ALBUTEROL SULFATE HFA 108 (90 BASE) MCG/ACT IN AERS: 2.0000 | INHALATION_SPRAY | RESPIRATORY_TRACT | Status: DC | PRN

## 2012-09-12 MED ORDER — CHLORPHENIRAMINE TANNATE 12 MG PO TABS: 12.0000 mg | ORAL_TABLET | Freq: Every evening | ORAL | Status: DC | PRN

## 2012-09-12 MED ORDER — TIOTROPIUM BROMIDE MONOHYDRATE 18 MCG IN CAPS: 18.0000 ug | ORAL_CAPSULE | Freq: Every day | RESPIRATORY_TRACT | Status: DC

## 2012-09-12 NOTE — Assessment & Plan Note (Signed)
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

## 2012-09-12 NOTE — Progress Notes (Signed)
Subjective:    Patient ID: Chris Lewis, male    DOB: 09-27-1940, 72 y.o.   MRN: [id number]  HPI  This is a 72 y.o.  man with COPD    09/12/2012 Pt ok until developed increase in pndrip.  Mucus now is yellow and then clear.  No sinus pressure. Notes bifrontal pain.  No real chest pain.  No real change in dyspnea.  No real wheeze.  No edema in feet.    Review of Systems  11 pt Ros taken and neg except as per HPI    Objective:   Physical Exam   Filed Vitals:   09/12/12 1113  BP: 122/76  Pulse: 73  Temp: 97.3 F (36.3 C)  TempSrc: Oral  Height: 5\' 9"  (1.753 m)  Weight: 164 lb 6.4 oz (74.571 kg)  SpO2: 97%    Gen: Pleasant, well-nourished, in no distress,  normal affect  ENT: No lesions,  mouth clear,  oropharynx clear, no postnasal drip  Neck: No JVD, no TMG, no carotid bruits  Lungs: No use of accessory muscles, no dullness to percussion, distant bs  Cardiovascular: RRR, heart sounds normal, no murmur or gallops, no peripheral edema  Abdomen: soft and NT, no HSM,  BS normal  Musculoskeletal: No deformities, no cyanosis or clubbing  Neuro: alert, non focal  Skin: Warm, no lesions or rashes  No results found.       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 albuterol (VENTOLIN HFA) 108 (90 BASE) MCG/ACT inhaler Inhale 2 puffs into the lungs every 4 (four) hours as needed for wheezing.  18 g  6  . aspirin 81 MG tablet Take 81 mg by mouth daily.        . Biotin 1000 MCG tablet Take 1,000 mcg by mouth daily.        . calcium carbonate (OS-CAL) 1250 MG chewable tablet Chew 1 tablet by mouth daily.      . calcium carbonate (TUMS EX) 750 MG chewable tablet  Chew 1 tablet by mouth as needed. For gas and reflux.      . Calcium Citrate-Vitamin D (CALCIUM CITRATE + PO) Take 1 tablet by mouth daily.        . Cholecalciferol (VITAMIN D3) 1000 UNITS CAPS Take 1,000 Units by mouth 2 (two) times daily.       . clonazePAM (KLONOPIN) 0.5 MG tablet Take 0.5 mg by mouth at bedtime as needed. For sleep.      . fluticasone (FLONASE) 50 MCG/ACT nasal spray Place 2 sprays into the nose daily.      . Lutein 6 MG CAPS Take 1 capsule by mouth daily.       . metroNIDAZOLE (METROGEL) 1 % gel Apply 1 application topically daily. Apply thin layer to affected area externally      . Multiple Vitamins-Minerals (PRESERVISION AREDS) CAPS Take by mouth. Take 1 capsule in am and 1 in pm      . Polyethyl Glycol-Propyl Glycol (SYSTANE OP) Apply 1 drop to eye 2 (two) times daily.        Marland Kitchen tiotropium (SPIRIVA HANDIHALER) 18 MCG inhalation capsule Place 1 capsule (18 mcg total) into inhaler and  inhale daily.  30 capsule  11  . traMADol (ULTRAM) 50 MG tablet Take 1 tablet by mouth Every 6 hours as needed.      . [DISCONTINUED] albuterol (VENTOLIN HFA) 108 (90 BASE) MCG/ACT inhaler Inhale 2 puffs into the lungs every 4 (four) hours as needed for wheezing.  18 g  6  . [DISCONTINUED] SPIRIVA HANDIHALER 18 MCG inhalation capsule INHALE 1 CAPSULE VIA HANDIHALER ONCE DAILY AT THE SAME TIME EVERY DAY  30 each  6  . Chlorpheniramine Tannate 12 MG TABS Take 1 tablet (12 mg total) by mouth at bedtime as needed.  30 each  6  . Multiple Vitamins-Minerals (MULTIVITAMINS THER. W/MINERALS) TABS Take 1 tablet by mouth daily.        . [DISCONTINUED] isosorbide dinitrate (ISORDIL) 10 MG tablet Take 10 mg by mouth 3 (three) times daily. Takes 1/2 tab, SL under tongue prior to each meal      . [DISCONTINUED] chlorhexidine (HIBICLENS) 4 % liquid 2 application

## 2012-09-12 NOTE — Patient Instructions (Addendum)
Try chlorpheniramine 12mg  at bedtime for runny nose Stay on flonase daily Stay on spiriva  Albuterol as needed Return 6 months

## 2012-09-18 ENCOUNTER — Other Ambulatory Visit: Payer: Self-pay | Admitting: Critical Care Medicine

## 2012-09-19 NOTE — Telephone Encounter (Signed)
Albuterol hfa rx last sent on 09/12/12 # 18gm x 6.  Called [facility], spoke with Trinna Post.  He verified rx from Jan 28 was received.  Nothing further needed.

## 2012-09-26 ENCOUNTER — Telehealth: Payer: Self-pay | Admitting: Critical Care Medicine

## 2012-09-26 MED ORDER — AZITHROMYCIN 250 MG PO TABS: ORAL_TABLET | ORAL | Status: DC

## 2012-09-26 MED ORDER — PREDNISONE 10 MG PO TABS: ORAL_TABLET | ORAL | Status: DC

## 2012-09-26 NOTE — Telephone Encounter (Signed)
Called, spoke with pt. C/o head and chest congestion, prod cough with yellow mucus, and blowing clear to light yellow mucus from nose.  Reports symptoms are during the first half of the day but "Once I get cleared out, I'm fine."  Denies increased SOB, wheezing, chest tightness, chest pain, or f/c/s.  Has not tried any OTC meds.  Last seen by PW on 09/12/12 - requesting rx to be called in.  Dr. Delford Field, pls advise.  Thank you.  Allergies verified with pt:  Allergies  Allergen Reactions  . Contrast Media (Iodinated Diagnostic Agents) Rash    Heavy rash and itching 20 yrs ago. Needs full premeds and does well with this.  . Atorvastatin     REACTION: muscle pain  . Hydrocodone     rash  . Rosuvastatin     REACTION: muscle weakness    [facility] [address]

## 2012-09-26 NOTE — Telephone Encounter (Signed)
Call in:  Azithromycin 250mg Take two once then one daily until gone #6 Prednisone 10mg Take 4 for two days three for two days two for two days one for two days #20 

## 2012-09-26 NOTE — Telephone Encounter (Signed)
Pt aware of PW recs and RX for both medications have been sent to his pharmacy.

## 2012-11-02 DIAGNOSIS — Z85828 Personal history of other malignant neoplasm of skin: Secondary | ICD-10-CM | POA: Diagnosis not present

## 2012-11-13 DIAGNOSIS — R509 Fever, unspecified: Secondary | ICD-10-CM | POA: Diagnosis not present

## 2012-11-20 ENCOUNTER — Other Ambulatory Visit: Payer: Self-pay | Admitting: Gastroenterology

## 2012-11-22 ENCOUNTER — Encounter (HOSPITAL_COMMUNITY)
Admission: RE | Disposition: A | Discharge status: Home / Self Care | Payer: Self-pay | Source: Ambulatory Visit | Attending: Gastroenterology

## 2012-11-22 ENCOUNTER — Encounter (HOSPITAL_COMMUNITY): Payer: Self-pay | Admitting: *Deleted

## 2012-11-22 ENCOUNTER — Ambulatory Visit (HOSPITAL_COMMUNITY)
Admission: RE | Admit: 2012-11-22 | Discharge: 2012-11-22 | Disposition: A | Discharge status: Home / Self Care | Payer: BC Managed Care – PPO | Source: Ambulatory Visit | Attending: Gastroenterology | Admitting: Gastroenterology

## 2012-11-22 DIAGNOSIS — J449 Chronic obstructive pulmonary disease, unspecified: Secondary | ICD-10-CM | POA: Diagnosis not present

## 2012-11-22 DIAGNOSIS — E7800 Pure hypercholesterolemia, unspecified: Secondary | ICD-10-CM | POA: Diagnosis not present

## 2012-11-22 DIAGNOSIS — K220 Achalasia of cardia: Secondary | ICD-10-CM | POA: Diagnosis not present

## 2012-11-22 DIAGNOSIS — Z8673 Personal history of transient ischemic attack (TIA), and cerebral infarction without residual deficits: Secondary | ICD-10-CM | POA: Diagnosis not present

## 2012-11-22 DIAGNOSIS — J4489 Other specified chronic obstructive pulmonary disease: Secondary | ICD-10-CM | POA: Insufficient documentation

## 2012-11-22 HISTORY — PX: ESOPHAGOGASTRODUODENOSCOPY (EGD) WITH ESOPHAGEAL DILATION: SHX5812

## 2012-11-22 SURGERY — ESOPHAGOGASTRODUODENOSCOPY (EGD) WITH ESOPHAGEAL DILATION
Anesthesia: Moderate Sedation

## 2012-11-22 MED ORDER — FENTANYL CITRATE 0.05 MG/ML IJ SOLN
INTRAMUSCULAR | Status: DC | PRN
  Administered 2012-11-22 (×2): 25 ug via INTRAVENOUS
  Administered 2012-11-22: 50 ug via INTRAVENOUS

## 2012-11-22 MED ORDER — MIDAZOLAM HCL 10 MG/2ML IJ SOLN
INTRAMUSCULAR | Status: DC | PRN
  Administered 2012-11-22: 2 mg via INTRAVENOUS
  Administered 2012-11-22: 1 mg via INTRAVENOUS
  Administered 2012-11-22 (×3): 2 mg via INTRAVENOUS

## 2012-11-22 MED ORDER — DIPHENHYDRAMINE HCL 50 MG/ML IJ SOLN
INTRAMUSCULAR | Status: AC
  Filled 2012-11-22: qty 1

## 2012-11-22 MED ORDER — BUTAMBEN-TETRACAINE-BENZOCAINE 2-2-14 % EX AERO
INHALATION_SPRAY | CUTANEOUS | Status: DC | PRN
  Administered 2012-11-22: 2 via TOPICAL

## 2012-11-22 MED ORDER — FENTANYL CITRATE 0.05 MG/ML IJ SOLN
INTRAMUSCULAR | Status: AC
  Filled 2012-11-22: qty 4

## 2012-11-22 MED ORDER — SODIUM CHLORIDE 0.9 % IV SOLN
INTRAVENOUS | Status: DC
  Administered 2012-11-22: 500 mL via INTRAVENOUS

## 2012-11-22 MED ORDER — SODIUM CHLORIDE 0.9 % IJ SOLN
100.0000 [IU] | INTRAMUSCULAR | Status: AC
  Administered 2012-11-22: 100 [IU] via SUBMUCOSAL
  Filled 2012-11-22: qty 100

## 2012-11-22 MED ORDER — MIDAZOLAM HCL 10 MG/2ML IJ SOLN
INTRAMUSCULAR | Status: AC
  Filled 2012-11-22: qty 4

## 2012-11-22 NOTE — Op Note (Signed)
Procedure: Esophagogastroduodenoscopy with Botox injection into the lower esophageal sphincter muscle to treat chronic achalasia.  Endoscopist: Danise Edge  Premedication: Fentanyl 100 mcg intravenously. Versed 8 mg intravenously.  Procedure: The patient was placed in the left lateral decubitus position. The Pentax gastroscope was passed through the posterior hypopharynx into the proximal esophagus without difficulty. The hypopharynx, larynx, and vocal cords appeared normal.  Esophagoscopy: The proximal, mid, and lower segments of the esophageal mucosa appear normal. The squamocolumnar junction is regular and noted at approximately 44 cm from the incisor teeth. A total of 100 units of Botox was injected into the lower esophageal sphincter muscle ( Four quadrant injections with 25 units per injection). There is no endoscopic evidence for the presence of erosive esophagitis, esophageal stricture formation, or Barrett's esophagus.  Gastroscopy: Retroflex view of the gastric cardia and fundus was normal. The gastric body, antrum, and pylorus appeared normal.  Duodenoscopy: The duodenal bulb and descending duodenum appeared normal.  Assessment: Botox injection into the lower esophageal sphincter muscle to treat chronic achalasia. Normal esophagogastroduodenoscopy.

## 2012-11-22 NOTE — H&P (Signed)
  Procedure: Esophagogastroduodenoscopy with Botox injection into the lower esophageal sphincter to treat chronic achalasia  History: The patient is a 72 year old male born [birth year]. The patient has chronic achalasia treated with Botox injections cause he was felt to be at high risk for achalasia surgery. The patient has undergone mitral valve repair in September 2012. His preoperative cardiac catheterization showed patent coronary arteries with normal left ventricular function.  The patient is scheduled to undergo repeat esophagogastroduodenoscopy with Botox injection into the lower esophageal sphincter muscle.  Chronic medications: Aspirin. Systane. Biaxin. Lutein. MetroGel. Vitamin D. Calcium. Albuterol. Spiriva. Oxycodone with acetaminophen. Flonase. Clonazepam.  Past medical history: Achalasia. Hypercholesterolemia. Chronic obstructive pulmonary disease. Chronic anxiety. Seasonal allergic rhinitis. TIA in 2005. Mitral valve repair surgery to treat severe mitral regurgitation. Osteopenia. Cholecystectomy. Right inguinal hernia surgery. Back surgery. Foot surgery. Eyebrow surgery.  Habits: The patient quit smoking cigarettes in 1999. He quit consuming alcohol 10 years ago.  Allergies: Iodinated contrast dye. Lipitor. Simvastatin. Crestor. Vicodin. Methocarbamol.  Plan: Proceed with esophagogastroduodenoscopy with Botox injection into the lower esophageal sphincter muscle to treat chronic achalasia.

## 2012-11-23 ENCOUNTER — Encounter (HOSPITAL_COMMUNITY): Payer: Self-pay | Admitting: Gastroenterology

## 2012-12-18 ENCOUNTER — Other Ambulatory Visit: Payer: Self-pay | Admitting: Critical Care Medicine

## 2012-12-20 ENCOUNTER — Ambulatory Visit (INDEPENDENT_AMBULATORY_CARE_PROVIDER_SITE_OTHER)
Admission: RE | Admit: 2012-12-20 | Discharge: 2012-12-20 | Disposition: A | Discharge status: Home / Self Care | Payer: BC Managed Care – PPO | Source: Ambulatory Visit | Attending: Critical Care Medicine | Admitting: Critical Care Medicine

## 2012-12-20 ENCOUNTER — Encounter: Payer: Self-pay | Admitting: Critical Care Medicine

## 2012-12-20 ENCOUNTER — Ambulatory Visit (INDEPENDENT_AMBULATORY_CARE_PROVIDER_SITE_OTHER): Payer: BC Managed Care – PPO | Admitting: Critical Care Medicine

## 2012-12-20 VITALS — BP 112/80 | HR 81 | Temp 98.8°F | Ht 69.0 in | Wt 161.0 lb

## 2012-12-20 DIAGNOSIS — J441 Chronic obstructive pulmonary disease with (acute) exacerbation: Secondary | ICD-10-CM

## 2012-12-20 DIAGNOSIS — J209 Acute bronchitis, unspecified: Secondary | ICD-10-CM

## 2012-12-20 DIAGNOSIS — J690 Pneumonitis due to inhalation of food and vomit: Secondary | ICD-10-CM

## 2012-12-20 MED ORDER — MOXIFLOXACIN HCL 400 MG PO TABS: 400.0000 mg | ORAL_TABLET | Freq: Every day | ORAL | Status: DC

## 2012-12-20 MED ORDER — PREDNISONE 10 MG PO TABS: ORAL_TABLET | ORAL | Status: DC

## 2012-12-20 NOTE — Patient Instructions (Addendum)
Avelox for 5 days Prednisone 10mg  Take 4 for three days 3 for three days 2 for three days 1 for three days and stop No other medication changes Chest xray today Follow up with GI for esophagus treatments ?[facility] referral Work on inhaler technique

## 2012-12-20 NOTE — Progress Notes (Signed)
Subjective:    Patient ID: Chris Lewis, male    DOB: [birth year], 72 y.o.   MRN: [id number]  HPI  This is a 72 y.o.  man with COPD   12/20/2012 Pt ill, feels cold, teeth chattered. Feels chilly. Pt more allergies noted.  Mucus is productive in AM, bloody, mucus, also yellow.  Notes some clear mucus out the nose Throat is sore.  Notes some low grade fever. Recent botox per [facility] for Achalasia. Spastic esophagus.   Review of Systems  11 pt Ros taken and neg except as per HPI    Objective:   Physical Exam   Filed Vitals:   12/20/12 1016  BP: 112/80  Pulse: 81  Temp: 98.8 F (37.1 C)  TempSrc: Oral  Height: 5\' 9"  (1.753 m)  Weight: 161 lb (73.029 kg)  SpO2: 94%    Gen: Pleasant, well-nourished, in no distress,  normal affect  ENT: No lesions,  mouth clear,  oropharynx clear, no postnasal drip  Neck: No JVD, no TMG, no carotid bruits  Lungs: No use of accessory muscles, no dullness to percussion, distant bs, scattered rhonchi and expired wheezes  Cardiovascular: RRR, heart sounds normal, no murmur or gallops, no peripheral edema  Abdomen: soft and NT, no HSM,  BS normal  Musculoskeletal: No deformities, no cyanosis or clubbing  Neuro: alert, non focal  Skin: Warm, no lesions or rashes  Dg Chest 2 View  12/20/2012  *RADIOLOGY REPORT*  Clinical Data: COPD.  Cough.  CHEST - 2 VIEW  Comparison: 08/23/2011  Findings: Advanced COPD with hyperinflation of the lungs and apical emphysema particularly on the left.  There is pulmonary scarring which is more severe on the right.  Calcified granuloma on the right is stable.  Mitral valve replacement is noted.  Heart size is normal.  Increased reticular markings in the right lung compared with the prior study.  This could represent interstitial edema or possibly pneumonia.  Small pleural effusion on the lateral view.  IMPRESSION: Severe COPD with scarring.  Small pleural effusion.  Increased reticular markings in the right may  represent interstitial edema or possibly pneumonia.   Original Report Authenticated By: Janeece Riggers, M.D.        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 COPD with recurrent bronchitis with chronic aspiration syndrome do to significant reflux disease and achalasia and nocturnal emesis with aspiration noted Now acute flare due to aspiration pneumonitis and acute tracheobronchitis do to chronic aspiration Plan Avelox for 5 days Prednisone 10mg  Take 4 for three days 3 for three days 2 for three days 1 for three days and stop No other medication changes     Updated Medication List Outpatient Encounter Prescriptions as of 12/20/2012  Medication Sig Dispense Refill  . albuterol (VENTOLIN HFA) 108 (90 BASE) MCG/ACT inhaler Inhale 2 puffs into the lungs every 4 (four) hours as needed for wheezing.  18 g  6  . aspirin 81 MG tablet Take 81 mg by mouth daily.        . Biotin 1000 MCG tablet Take 1,000 mcg by mouth daily.        . calcium carbonate (OS-CAL) 1250 MG chewable tablet Chew 1 tablet by mouth daily.      . Calcium Citrate-Vitamin D (CALCIUM CITRATE + PO) Take 1 tablet by mouth daily.        . cetirizine (ZYRTEC) 10 MG tablet Take 10  mg by mouth daily.      . Chlorpheniramine Tannate 12 MG TABS Take 1 tablet (12 mg total) by mouth at bedtime as needed.  30 each  6  . Cholecalciferol (VITAMIN D3) 1000 UNITS CAPS Take 1,000 Units by mouth 2 (two) times daily.       . clonazePAM (KLONOPIN) 0.5 MG tablet Take 0.5 mg by mouth 3 (three) times daily before meals.       . fluticasone (FLONASE) 50 MCG/ACT nasal spray Place 2 sprays into the nose daily.      . Lutein 6 MG CAPS Take 1 capsule by mouth daily.       . metroNIDAZOLE (METROGEL) 1 % gel Apply 1 application topically daily. Apply thin layer to affected area externally      . Multiple Vitamins-Minerals (PRESERVISION AREDS) CAPS Take by mouth. Take 1 capsule in am and  1 in pm      . oxyCODONE-acetaminophen (PERCOCET/ROXICET) 5-325 MG per tablet Take 1 tablet by mouth every 4 (four) hours as needed for pain.      Bertram Gala Glycol-Propyl Glycol (SYSTANE OP) Apply 1 drop to eye 2 (two) times daily.        Marland Kitchen tiotropium (SPIRIVA HANDIHALER) 18 MCG inhalation capsule Place 1 capsule (18 mcg total) into inhaler and inhale daily.  30 capsule  11  . traMADol (ULTRAM) 50 MG tablet Take 1 tablet by mouth Every 6 hours as needed.      . [DISCONTINUED] Multiple Vitamins-Minerals (MULTIVITAMINS THER. W/MINERALS) TABS Take 1 tablet by mouth daily.        . calcium carbonate (TUMS EX) 750 MG chewable tablet Chew 1 tablet by mouth as needed. For gas and reflux.      . moxifloxacin (AVELOX) 400 MG tablet Take 1 tablet (400 mg total) by mouth daily.  5 tablet  0  . predniSONE (DELTASONE) 10 MG tablet Take 4 for three days 3 for three days 2 for three days 1 for three days and stop  30 tablet  0  . [DISCONTINUED] azithromycin (ZITHROMAX) 250 MG tablet Take 2 by mouth on day 1 then 2 by mouth daily until gone  6 each  0  . [DISCONTINUED] predniSONE (DELTASONE) 10 MG tablet Take 4 x 2 days, 3 x 2 days, 2 x 2 days, 1 x 2 days then stop.  20 tablet  0  . [DISCONTINUED] SPIRIVA HANDIHALER 18 MCG inhalation capsule INHALE 1 CAPSULE VIA HANDIHALER ONCE DAILY AT THE SAME TIME EVERY DAY  30 capsule  2   No facility-administered encounter medications on file as of 12/20/2012.

## 2012-12-20 NOTE — Assessment & Plan Note (Signed)
Gold stage C. COPD with recurrent bronchitis with chronic aspiration syndrome do to significant reflux disease and achalasia and nocturnal emesis with aspiration noted Now acute flare due to aspiration pneumonitis and acute tracheobronchitis do to chronic aspiration Plan Avelox for 5 days Prednisone 10mg  Take 4 for three days 3 for three days 2 for three days 1 for three days and stop No other medication changes

## 2013-01-05 ENCOUNTER — Telehealth: Payer: Self-pay | Admitting: Critical Care Medicine

## 2013-01-05 MED ORDER — MOXIFLOXACIN HCL 400 MG PO TABS: 400.0000 mg | ORAL_TABLET | Freq: Every day | ORAL | Status: DC

## 2013-01-05 NOTE — Telephone Encounter (Signed)
I spoke with pt and he c/o cough w/ yellow-green phlem, body aches all over,whezing, chest  Tx, slight increase SOB x yesterday. No f/c/s/n/v. Pt is not taking anything OTC. He was just seen on 12/20/12. Pt is requesting recs. Please advise Dr. Delford Field thanks  Allergies  Allergen Reactions  . Contrast Media (Iodinated Diagnostic Agents) Rash    Heavy rash and itching 20 yrs ago. Needs full premeds and does well with this.  . Atorvastatin     REACTION: muscle pain  . Hydrocodone     rash  . Rosuvastatin     REACTION: muscle weakness

## 2013-01-05 NOTE — Telephone Encounter (Signed)
Call in avelox 400mg /d x 5days 

## 2013-01-05 NOTE — Telephone Encounter (Signed)
I spoke with pt and is aware of recs. RX has been called in. Nothing further was needed

## 2013-01-22 DIAGNOSIS — K220 Achalasia of cardia: Secondary | ICD-10-CM | POA: Diagnosis not present

## 2013-01-31 ENCOUNTER — Ambulatory Visit (INDEPENDENT_AMBULATORY_CARE_PROVIDER_SITE_OTHER): Payer: BC Managed Care – PPO | Admitting: Critical Care Medicine

## 2013-01-31 ENCOUNTER — Ambulatory Visit (INDEPENDENT_AMBULATORY_CARE_PROVIDER_SITE_OTHER)
Admission: RE | Admit: 2013-01-31 | Discharge: 2013-01-31 | Disposition: A | Discharge status: Home / Self Care | Payer: BC Managed Care – PPO | Source: Ambulatory Visit | Attending: Critical Care Medicine | Admitting: Critical Care Medicine

## 2013-01-31 ENCOUNTER — Encounter: Payer: Self-pay | Admitting: Critical Care Medicine

## 2013-01-31 ENCOUNTER — Telehealth: Payer: Self-pay | Admitting: Critical Care Medicine

## 2013-01-31 VITALS — BP 104/62 | HR 82 | Temp 97.5°F | Ht 69.0 in | Wt 160.0 lb

## 2013-01-31 DIAGNOSIS — R042 Hemoptysis: Secondary | ICD-10-CM

## 2013-01-31 DIAGNOSIS — J441 Chronic obstructive pulmonary disease with (acute) exacerbation: Secondary | ICD-10-CM

## 2013-01-31 DIAGNOSIS — R05 Cough: Secondary | ICD-10-CM | POA: Diagnosis not present

## 2013-01-31 MED ORDER — MOXIFLOXACIN HCL 400 MG PO TABS: 400.0000 mg | ORAL_TABLET | Freq: Every day | ORAL | Status: DC

## 2013-01-31 MED ORDER — PREDNISONE 10 MG PO TABS: ORAL_TABLET | ORAL | Status: DC

## 2013-01-31 NOTE — Telephone Encounter (Signed)
Pt is scheduled to come in at 4:30 but will arrive 15 min early since PW had a cancellation.  Nothing further was needed

## 2013-01-31 NOTE — Telephone Encounter (Signed)
No openings in office today ( PW is only side A dr this PM and TP is off this PM).   Will need to send msg to PW to advise on further recs.  ? Want to work pt in today.

## 2013-01-31 NOTE — Progress Notes (Signed)
Subjective:    Patient ID: Chris Lewis, male    DOB: 1940-11-19, 72 y.o.   MRN: [id number]  HPI  This is a 72 y.o.  man with COPD   01/31/2013 Acute onset of coughing blood for three days and more dyspnea and mucus. No fever.  Notes more dyspnea. Mucus also is yellow in nature. No real chest pain.   No real wheeze.  Pt at [facility] for GI eval for achalasia    Review of Systems  11 pt Ros taken and neg except as per HPI    Objective:   Physical Exam   Filed Vitals:   01/31/13 1517  BP: 104/62  Pulse: 82  Temp: 97.5 F (36.4 C)  TempSrc: Oral  Height: 5\' 9"  (1.753 m)  Weight: 72.576 kg (160 lb)  SpO2: 95%    Gen: Pleasant, well-nourished, in no distress,  normal affect  ENT: No lesions,  mouth clear,  oropharynx clear, no postnasal drip  Neck: No JVD, no TMG, no carotid bruits  Lungs: No use of accessory muscles, no dullness to percussion, distant bs, scattered rhonchi and expired wheezes  Cardiovascular: RRR, heart sounds normal, no murmur or gallops, no peripheral edema  Abdomen: soft and NT, no HSM,  BS normal  Musculoskeletal: No deformities, no cyanosis or clubbing  Neuro: alert, non focal  Skin: Warm, no lesions or rashes  Dg Chest 2 View  01/31/2013   *RADIOLOGY REPORT*  Clinical Data: Cough, shortness of breath and chest pain.  CHEST - 2 VIEW  Comparison: PA and lateral chest 08/23/2011 and 12/20/2012.  Findings: The lungs are severely emphysematous.  Calcified granuloma right lower lobe is identified.  There is patchy airspace opacity in the right upper lobe.  Left lung appears clear.  Heart size is normal.  IMPRESSION: Severe appearing emphysema with patchy right upper lobe airspace opacity worrisome for pneumonia.   Original Report Authenticated By: Holley Dexter, M.D.       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 albuterol (VENTOLIN HFA) 108 (90 BASE) MCG/ACT inhaler Inhale 2 puffs into the lungs every 4 (four) hours as needed for wheezing.  18 g  6  . aspirin 81 MG tablet Take 81 mg by mouth daily.        . Biotin 1000 MCG tablet Take 1,000 mcg by mouth daily.        . calcium carbonate (OS-CAL) 1250 MG chewable tablet Chew 1 tablet by mouth daily.      . calcium carbonate (TUMS EX) 750 MG chewable tablet Chew 1 tablet by mouth as needed. For gas and reflux.      . Calcium Citrate-Vitamin D (CALCIUM CITRATE + PO) Take 1 tablet by mouth daily.        . cetirizine (ZYRTEC) 10 MG tablet Take 10 mg by mouth daily.      . Chlorpheniramine Tannate 12 MG TABS Take 1 tablet (12 mg total) by mouth at bedtime as needed.  30 each  6  . Cholecalciferol (VITAMIN D3) 1000  UNITS CAPS Take 1,000 Units by mouth 2 (two) times daily.       . clonazePAM (KLONOPIN) 0.5 MG tablet Take 0.5 mg by mouth 3 (three) times daily before meals.       . fluticasone (FLONASE) 50 MCG/ACT nasal spray Place 2 sprays into the nose daily.      . Lutein 6 MG CAPS Take 1 capsule by mouth daily.       . metroNIDAZOLE (METROGEL) 1 % gel Apply 1 application topically daily. Apply thin layer to affected area externally      . Multiple Vitamins-Minerals (PRESERVISION AREDS) CAPS Take by mouth. Take 1 capsule in am and 1 in pm      . oxyCODONE-acetaminophen (PERCOCET/ROXICET) 5-325 MG per tablet Take 1 tablet by mouth every 4 (four) hours as needed for pain.      Bertram Gala Glycol-Propyl Glycol (SYSTANE OP) Apply 1 drop to eye 2 (two) times daily.        Marland Kitchen tiotropium (SPIRIVA HANDIHALER) 18 MCG inhalation capsule Place 1 capsule (18 mcg total) into  inhaler and inhale daily.  30 capsule  11  . traMADol (ULTRAM) 50 MG tablet Take 1 tablet by mouth Every 6 hours as needed.      . moxifloxacin (AVELOX) 400 MG tablet Take 1 tablet (400 mg total) by mouth daily.  10 tablet  0  . predniSONE (DELTASONE) 10 MG tablet Take 4 for three days 3 for three days 2 for three days 1 for three days and stop  30 tablet  0  . [DISCONTINUED] moxifloxacin (AVELOX) 400 MG tablet Take 1 tablet (400 mg total) by mouth daily.  5 tablet  0  . [DISCONTINUED] predniSONE (DELTASONE) 10 MG tablet Take 4 for three days 3 for three days 2 for three days 1 for three days and stop  30 tablet  0   No facility-administered encounter medications on file as of 01/31/2013.

## 2013-01-31 NOTE — Patient Instructions (Addendum)
Avelox 400mg  per day for 10days Prednisone 10mg  Take 4 for three days 3 for three days 2 for three days 1 for three days and stop No other medication changes Return 2 weeks for recheck

## 2013-01-31 NOTE — Telephone Encounter (Signed)
Work in today CXR first

## 2013-01-31 NOTE — Telephone Encounter (Signed)
  Pt c/o increased chills since having PNA and completing course of abx. Denies Fever. Pt states that he is beginning to have increased cough mucus w/traces of blood--streaking x 3 days. Pt states that he is hurting and is weak.  Pt states that these symptoms are very similar to those that he had when he was dx with PNA.  Pt has appt 02/19/13 with PW---Pt supposed to go to [facility] 7/14 for surgery pre-op  Allergies  Allergen Reactions  . Contrast Media (Iodinated Diagnostic Agents) Rash    Heavy rash and itching 20 yrs ago. Needs full premeds and does well with this.  . Atorvastatin     REACTION: muscle pain  . Hydrocodone     rash  . Rosuvastatin     REACTION: muscle weakness   [facility] [address] [place]  Will send to PW nurse Crystal to see if there is somewhere to put pt today since PW schedule is booked up.  Please advise Dr Delford Field. Thanks.

## 2013-02-01 NOTE — Assessment & Plan Note (Signed)
Gold Stage C copd with aspiration pna d/t severe achalasia Plan  Avelox 400mg  per day for 10days Prednisone 10mg  Take 4 for three days 3 for three days 2 for three days 1 for three days and stop No other medication changes Return 2 weeks for recheck

## 2013-02-13 HISTORY — PX: LAPAROSCOPIC HELLER MYOTOMY: SUR780

## 2013-02-19 ENCOUNTER — Ambulatory Visit (INDEPENDENT_AMBULATORY_CARE_PROVIDER_SITE_OTHER)
Admission: RE | Admit: 2013-02-19 | Discharge: 2013-02-19 | Disposition: A | Discharge status: Home / Self Care | Payer: BC Managed Care – PPO | Source: Ambulatory Visit | Attending: Critical Care Medicine | Admitting: Critical Care Medicine

## 2013-02-19 ENCOUNTER — Ambulatory Visit (INDEPENDENT_AMBULATORY_CARE_PROVIDER_SITE_OTHER): Payer: BC Managed Care – PPO | Admitting: Critical Care Medicine

## 2013-02-19 ENCOUNTER — Encounter: Payer: Self-pay | Admitting: Critical Care Medicine

## 2013-02-19 VITALS — BP 110/78 | HR 76 | Temp 97.8°F | Ht 69.0 in | Wt 160.4 lb

## 2013-02-19 DIAGNOSIS — J441 Chronic obstructive pulmonary disease with (acute) exacerbation: Secondary | ICD-10-CM | POA: Diagnosis not present

## 2013-02-19 DIAGNOSIS — J690 Pneumonitis due to inhalation of food and vomit: Secondary | ICD-10-CM | POA: Diagnosis not present

## 2013-02-19 NOTE — Patient Instructions (Addendum)
Chest xray today No change in medications  Return 2 months 

## 2013-02-19 NOTE — Assessment & Plan Note (Signed)
Gold C Copd with recurrent tracheobronchitis and recent aspiration pna now resolved Plan Cont current inhaled medications

## 2013-02-19 NOTE — Progress Notes (Signed)
Subjective:    Patient ID: Chris Lewis, male    DOB: 1941/08/10, 72 y.o.   MRN: [id number]  HPI  This is a 72 y.o.  man with COPD   01/31/2013 Acute onset of coughing blood for three days and more dyspnea and mucus. No fever.  Notes more dyspnea. Mucus also is yellow in nature. No real chest pain.   No real wheeze.  Pt at [facility] for GI eval for achalasia  02/19/2013 Pt has been to [facility] once since last OV for achalasia Pt goes in for more testing on esophagus.  Pt here for f/u of RUL PNA . Pt finished the ABX and prednisone.   Cough is min in mornings. Pt notes some hoarseness. Pt remains weak.    Review of Systems  11 pt Ros taken and neg except as per HPI    Objective:   Physical Exam   Filed Vitals:   02/19/13 1346  BP: 110/78  Pulse: 76  Temp: 97.8 F (36.6 C)  TempSrc: Oral  Height: 5\' 9"  (1.753 m)  Weight: 160 lb 6.4 oz (72.757 kg)  SpO2: 95%    Gen: Pleasant, well-nourished, in no distress,  normal affect  ENT: No lesions,  mouth clear,  oropharynx clear, no postnasal drip  Neck: No JVD, no TMG, no carotid bruits  Lungs: No use of accessory muscles, no dullness to percussion, distant bs, scattered rhonchi and expired wheezes  Cardiovascular: RRR, heart sounds normal, no murmur or gallops, no peripheral edema  Abdomen: soft and NT, no HSM,  BS normal  Musculoskeletal: No deformities, no cyanosis or clubbing  Neuro: alert, non focal  Skin: Warm, no lesions or rashes  Dg Chest 2 View  02/19/2013   *RADIOLOGY REPORT*  Clinical Data: Follow up pneumonia  CHEST - 2 VIEW  Comparison: 01/31/2013, 12/30/2012  Findings: Severe COPD with apical emphysema and diffuse bilateral scarring.  Calcified granuloma right lung base is unchanged.  Right upper lobe irregular density again noted and is slightly improved.  This may be due to clearing pneumonia.  Continued radiographic follow-up until complete clearing is suggested.  IMPRESSION: Severe COPD.  Partial clearing  right upper lobe infiltrate.   Original Report Authenticated By: Janeece Riggers, M.D.       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 albuterol (VENTOLIN HFA) 108 (90 BASE) MCG/ACT inhaler Inhale 2 puffs into the lungs every 4 (four) hours as needed for wheezing.  18 g  6  . aspirin 81 MG tablet Take 81 mg by mouth daily.        . Biotin 1000 MCG tablet Take 1,000 mcg by mouth daily.        . calcium carbonate (OS-CAL) 1250 MG chewable tablet Chew 1 tablet by mouth daily.      . calcium carbonate (TUMS EX) 750 MG chewable tablet Chew 1 tablet by mouth as needed. For gas and reflux.      . cetirizine (ZYRTEC) 10 MG tablet Take 10 mg by mouth daily as needed.       . Chlorpheniramine Tannate 12 MG  TABS Take 1 tablet (12 mg total) by mouth at bedtime as needed.  30 each  6  . Cholecalciferol (VITAMIN D3) 1000 UNITS CAPS Take 1,000 Units by mouth daily.       . clonazePAM (KLONOPIN) 0.5 MG tablet Take 0.5 mg by mouth 3 (three) times daily before meals.       . fluticasone (FLONASE) 50 MCG/ACT nasal spray Place 2 sprays into the nose daily.      . Lutein 6 MG CAPS Take 1 capsule by mouth daily.       . metroNIDAZOLE (METROGEL) 1 % gel Apply 1 application topically daily. Apply thin layer to affected area externally      . Multiple Vitamins-Minerals (PRESERVISION AREDS) CAPS Take by mouth. Take 1 capsule in am and 1 in pm      . oxyCODONE-acetaminophen (PERCOCET/ROXICET) 5-325 MG per tablet Take 1 tablet by mouth every 4 (four) hours as needed for pain.      Bertram Gala  Glycol-Propyl Glycol (SYSTANE OP) Apply 1 drop to eye 2 (two) times daily.        Marland Kitchen tiotropium (SPIRIVA HANDIHALER) 18 MCG inhalation capsule Place 1 capsule (18 mcg total) into inhaler and inhale daily.  30 capsule  11  . traMADol (ULTRAM) 50 MG tablet Take 1 tablet by mouth Every 6 hours as needed.      . [DISCONTINUED] Calcium Citrate-Vitamin D (CALCIUM CITRATE + PO) Take 1 tablet by mouth daily.        . [DISCONTINUED] moxifloxacin (AVELOX) 400 MG tablet Take 1 tablet (400 mg total) by mouth daily.  10 tablet  0  . [DISCONTINUED] predniSONE (DELTASONE) 10 MG tablet Take 4 for three days 3 for three days 2 for three days 1 for three days and stop  30 tablet  0   No facility-administered encounter medications on file as of 02/19/2013.

## 2013-02-26 DIAGNOSIS — J449 Chronic obstructive pulmonary disease, unspecified: Secondary | ICD-10-CM | POA: Diagnosis not present

## 2013-02-26 DIAGNOSIS — F329 Major depressive disorder, single episode, unspecified: Secondary | ICD-10-CM | POA: Diagnosis present

## 2013-02-26 DIAGNOSIS — Z87891 Personal history of nicotine dependence: Secondary | ICD-10-CM | POA: Diagnosis not present

## 2013-02-26 DIAGNOSIS — F411 Generalized anxiety disorder: Secondary | ICD-10-CM | POA: Diagnosis present

## 2013-02-26 DIAGNOSIS — Z885 Allergy status to narcotic agent status: Secondary | ICD-10-CM | POA: Diagnosis not present

## 2013-02-26 DIAGNOSIS — K449 Diaphragmatic hernia without obstruction or gangrene: Secondary | ICD-10-CM | POA: Diagnosis not present

## 2013-02-26 DIAGNOSIS — J309 Allergic rhinitis, unspecified: Secondary | ICD-10-CM | POA: Diagnosis present

## 2013-02-26 DIAGNOSIS — E785 Hyperlipidemia, unspecified: Secondary | ICD-10-CM | POA: Diagnosis present

## 2013-02-26 DIAGNOSIS — K228 Other specified diseases of esophagus: Secondary | ICD-10-CM | POA: Diagnosis not present

## 2013-02-26 DIAGNOSIS — Z9889 Other specified postprocedural states: Secondary | ICD-10-CM | POA: Diagnosis not present

## 2013-02-26 DIAGNOSIS — I10 Essential (primary) hypertension: Secondary | ICD-10-CM | POA: Diagnosis present

## 2013-02-26 DIAGNOSIS — K220 Achalasia of cardia: Secondary | ICD-10-CM | POA: Diagnosis not present

## 2013-02-26 DIAGNOSIS — R1314 Dysphagia, pharyngoesophageal phase: Secondary | ICD-10-CM | POA: Diagnosis not present

## 2013-02-26 DIAGNOSIS — Z8673 Personal history of transient ischemic attack (TIA), and cerebral infarction without residual deficits: Secondary | ICD-10-CM | POA: Diagnosis not present

## 2013-02-26 DIAGNOSIS — R1310 Dysphagia, unspecified: Secondary | ICD-10-CM | POA: Diagnosis present

## 2013-02-26 DIAGNOSIS — K219 Gastro-esophageal reflux disease without esophagitis: Secondary | ICD-10-CM | POA: Diagnosis present

## 2013-02-26 DIAGNOSIS — J4489 Other specified chronic obstructive pulmonary disease: Secondary | ICD-10-CM | POA: Diagnosis not present

## 2013-02-26 DIAGNOSIS — R011 Cardiac murmur, unspecified: Secondary | ICD-10-CM | POA: Diagnosis present

## 2013-03-01 DIAGNOSIS — I6782 Cerebral ischemia: Secondary | ICD-10-CM | POA: Insufficient documentation

## 2013-03-01 DIAGNOSIS — Z8709 Personal history of other diseases of the respiratory system: Secondary | ICD-10-CM | POA: Insufficient documentation

## 2013-03-01 DIAGNOSIS — G939 Disorder of brain, unspecified: Secondary | ICD-10-CM | POA: Insufficient documentation

## 2013-03-08 DIAGNOSIS — I059 Rheumatic mitral valve disease, unspecified: Secondary | ICD-10-CM | POA: Diagnosis not present

## 2013-03-08 DIAGNOSIS — I10 Essential (primary) hypertension: Secondary | ICD-10-CM | POA: Diagnosis not present

## 2013-03-30 DIAGNOSIS — K220 Achalasia of cardia: Secondary | ICD-10-CM | POA: Diagnosis not present

## 2013-03-30 DIAGNOSIS — Z09 Encounter for follow-up examination after completed treatment for conditions other than malignant neoplasm: Secondary | ICD-10-CM | POA: Diagnosis not present

## 2013-04-05 ENCOUNTER — Other Ambulatory Visit: Payer: Self-pay | Admitting: Critical Care Medicine

## 2013-04-25 ENCOUNTER — Encounter: Payer: Self-pay | Admitting: Critical Care Medicine

## 2013-04-25 ENCOUNTER — Ambulatory Visit (INDEPENDENT_AMBULATORY_CARE_PROVIDER_SITE_OTHER): Payer: BC Managed Care – PPO | Admitting: Critical Care Medicine

## 2013-04-25 VITALS — BP 98/60 | HR 71 | Temp 97.5°F | Ht 69.0 in | Wt 164.8 lb

## 2013-04-25 DIAGNOSIS — J441 Chronic obstructive pulmonary disease with (acute) exacerbation: Secondary | ICD-10-CM | POA: Diagnosis not present

## 2013-04-25 DIAGNOSIS — Z23 Encounter for immunization: Secondary | ICD-10-CM

## 2013-04-25 MED ORDER — MOMETASONE FUROATE 50 MCG/ACT NA SUSP: 2.0000 | Freq: Every day | NASAL | Status: DC

## 2013-04-25 NOTE — Patient Instructions (Addendum)
A flu vaccine was given No change in medications Return 4 months 

## 2013-04-25 NOTE — Progress Notes (Signed)
Subjective:    Patient ID: Chris Lewis, male    DOB: Mar 13, 1941, 72 y.o.   MRN: [id number]  HPI  This is a 72 y.o.  man with COPD   04/25/2013 Chief Complaint  Patient presents with  . 2 month follow up    Breathing is unchanged but does have a prod cough with tan to dark mucus.  No f/c/s.  Pt went to [facility].  Pt rx hiatal hernia, put a Nissen fundoplication in place.  Myotomy and partial fundoplicatoin Able to sleep in bed and diet is advanced. Pt still with bronchial secretions, dark Gordan, better than before.   Review of Systems  11 pt Ros taken and neg except as per HPI    Objective:   Physical Exam   Filed Vitals:   04/25/13 1413  BP: 98/60  Pulse: 71  Temp: 97.5 F (36.4 C)  TempSrc: Oral  Height: 5\' 9"  (1.753 m)  Weight: 164 lb 12.8 oz (74.753 kg)  SpO2: 92%    Gen: Pleasant, well-nourished, in no distress,  normal affect  ENT: No lesions,  mouth clear,  oropharynx clear, no postnasal drip  Neck: No JVD, no TMG, no carotid bruits  Lungs: No use of accessory muscles, no dullness to percussion, distant bs, scattered rhonchi and expired wheezes  Cardiovascular: RRR, heart sounds normal, no murmur or gallops, no peripheral edema  Abdomen: soft and NT, no HSM,  BS normal  Musculoskeletal: No deformities, no cyanosis or clubbing  Neuro: alert, non focal  Skin: Warm, no lesions or rashes  No results found.     Assessment & Plan:   Gold stage C. COPD with recurrent bronchitis do to chronic aspiration syndrome Pt improved s/p achalasia surgery at [facility] No change in inhaled or maintenance medications. Return in  4 months   Updated Medication List Outpatient Encounter Prescriptions as of 04/25/2013  Medication Sig Dispense Refill  . acetaminophen (TYLENOL) 160 MG/5ML solution Take 650 mg by mouth every 6 (six) hours as needed for fever.      Marland Kitchen amoxicillin (AMOXIL) 500 MG capsule Take 500 mg by mouth. Prior to dental procedures      . aspirin 81  MG tablet Take 81 mg by mouth daily.        . Biotin 1000 MCG tablet Take 1,000 mcg by mouth daily.        . calcium carbonate (OS-CAL) 1250 MG chewable tablet Chew 1 tablet by mouth daily.      . calcium carbonate (TUMS EX) 750 MG chewable tablet Chew 1 tablet by mouth as needed. For gas and reflux.      . cetirizine (ZYRTEC) 10 MG tablet Take 10 mg by mouth daily as needed.       . chlorhexidine (PERIDEX) 0.12 % solution Swish with 1/2 ounce for 30 seconds twice daily      . Chlorpheniramine Tannate 12 MG TABS Take 1 tablet (12 mg total) by mouth at bedtime as needed.  30 each  6  . Cholecalciferol (VITAMIN D3) 1000 UNITS CAPS Take 1,000 Units by mouth daily.       . clonazePAM (KLONOPIN) 0.5 MG tablet Take 0.25 mg by mouth at bedtime.       . fluticasone (FLONASE) 50 MCG/ACT nasal spray Place 2 sprays into the nose daily.      . Lutein 6 MG CAPS Take 1 capsule by mouth daily.       . metroNIDAZOLE (METROGEL) 1 % gel Apply  1 application topically daily. Apply thin layer to affected area externally      . Multiple Vitamins-Minerals (ICAPS) CAPS Take 1 capsule by mouth 2 (two) times daily.      . ondansetron (ZOFRAN-ODT) 4 MG disintegrating tablet Take 4 mg by mouth every 8 (eight) hours as needed for nausea.      Marland Kitchen oxyCODONE (ROXICODONE) 5 MG/5ML solution Take by mouth every 4 (four) hours as needed for pain. Take 5-10 ml      . Polyethyl Glycol-Propyl Glycol (SYSTANE OP) Apply 1 drop to eye 2 (two) times daily.        . polyethylene glycol (MIRALAX / GLYCOLAX) packet Take 17 g by mouth daily as needed.      . tiotropium (SPIRIVA HANDIHALER) 18 MCG inhalation capsule Place 1 capsule (18 mcg total) into inhaler and inhale daily.  30 capsule  11  . traMADol (ULTRAM) 50 MG tablet Take 1 tablet by mouth Every 6 hours as needed.      . VENTOLIN HFA 108 (90 BASE) MCG/ACT inhaler INHALE 2 PUFFS INTO THE LUNGS EVERY 4 (FOUR) HOURS AS NEEDED FOR WHEEZING.  18 each  6  . [DISCONTINUED] Multiple  Vitamins-Minerals (PRESERVISION AREDS) CAPS Take by mouth. Take 1 capsule in am and 1 in pm      . [DISCONTINUED] oxyCODONE-acetaminophen (PERCOCET/ROXICET) 5-325 MG per tablet Take 1 tablet by mouth every 4 (four) hours as needed for pain.      . [DISCONTINUED] mometasone (NASONEX) 50 MCG/ACT nasal spray Place 2 sprays into the nose daily.  17 g  0   No facility-administered encounter medications on file as of 04/25/2013.

## 2013-04-26 NOTE — Assessment & Plan Note (Signed)
Pt improved s/p achalasia surgery at [facility] No change in inhaled or maintenance medications. Return in  4 months

## 2013-05-16 ENCOUNTER — Other Ambulatory Visit: Payer: Self-pay

## 2013-05-16 DIAGNOSIS — I359 Nonrheumatic aortic valve disorder, unspecified: Secondary | ICD-10-CM

## 2013-05-30 ENCOUNTER — Other Ambulatory Visit: Payer: Self-pay | Admitting: *Deleted

## 2013-05-31 ENCOUNTER — Telehealth: Payer: Self-pay | Admitting: Critical Care Medicine

## 2013-05-31 ENCOUNTER — Other Ambulatory Visit: Payer: Self-pay | Admitting: Thoracic Surgery (Cardiothoracic Vascular Surgery)

## 2013-05-31 ENCOUNTER — Other Ambulatory Visit: Payer: Self-pay | Admitting: *Deleted

## 2013-05-31 DIAGNOSIS — Z91041 Radiographic dye allergy status: Secondary | ICD-10-CM

## 2013-05-31 DIAGNOSIS — J449 Chronic obstructive pulmonary disease, unspecified: Secondary | ICD-10-CM

## 2013-05-31 MED ORDER — DIPHENHYDRAMINE HCL 50 MG PO TABS: 50.0000 mg | ORAL_TABLET | Freq: Once | ORAL | Status: DC

## 2013-05-31 MED ORDER — PREDNISONE (PAK) 10 MG PO TABS: 10.0000 mg | ORAL_TABLET | Freq: Every day | ORAL | Status: AC

## 2013-05-31 NOTE — Telephone Encounter (Signed)
Spoke with patient; pt has CT neck schedule at 10:45am and will be here to see PW at 2:00pm(CXR prior to visit-be here at 1:30pm).

## 2013-05-31 NOTE — Telephone Encounter (Signed)
Work him in [employer] account [employer],  cxr first

## 2013-05-31 NOTE — Telephone Encounter (Signed)
Last OV 04/25/13, f/u in 4 months. At the time of OV the pt was c/o productive coughwith light Zou phlegm and states Dr. Delford Field asked him if he wanted rx called in and the pt refused at that time. Pt staets chest congestion and productive cough with Mcmath phlegm is worse now. He also states he has a lot of nasal congestion with dark mucus as well. Pt denies any wheezing, no SOB, no chest tightness at this time. Pt does not want appt with another MD. Please advise. Carron Curie, CMA Allergies  Allergen Reactions  . Contrast Media [Iodinated Diagnostic Agents] Rash    Heavy rash and itching 20 yrs ago. Needs full premeds and does well with this.  . Atorvastatin     REACTION: muscle pain REACTION: muscle pain  . Propoxyphene-Acetaminophen Itching    rash  . Rosuvastatin     REACTION: muscle weakness  . Hydrocodone Rash    rash rash  . Metrizamide Rash    Heavy rash and itching 20 yrs ago. Needs full premeds and does well with this.

## 2013-05-31 NOTE — Telephone Encounter (Signed)
lmomtcb x1 for pt 

## 2013-06-01 ENCOUNTER — Ambulatory Visit (INDEPENDENT_AMBULATORY_CARE_PROVIDER_SITE_OTHER)
Admission: RE | Admit: 2013-06-01 | Discharge: 2013-06-01 | Disposition: A | Discharge status: Home / Self Care | Payer: BC Managed Care – PPO | Source: Ambulatory Visit | Attending: Critical Care Medicine | Admitting: Critical Care Medicine

## 2013-06-01 ENCOUNTER — Encounter: Payer: Self-pay | Admitting: Critical Care Medicine

## 2013-06-01 ENCOUNTER — Ambulatory Visit (INDEPENDENT_AMBULATORY_CARE_PROVIDER_SITE_OTHER): Payer: BC Managed Care – PPO | Admitting: Critical Care Medicine

## 2013-06-01 ENCOUNTER — Ambulatory Visit
Admission: RE | Admit: 2013-06-01 | Discharge: 2013-06-01 | Disposition: A | Discharge status: Home / Self Care | Payer: BC Managed Care – PPO | Source: Ambulatory Visit | Attending: Thoracic Surgery (Cardiothoracic Vascular Surgery) | Admitting: Thoracic Surgery (Cardiothoracic Vascular Surgery)

## 2013-06-01 VITALS — BP 122/70 | HR 97 | Temp 98.3°F | Ht 69.0 in | Wt 161.8 lb

## 2013-06-01 DIAGNOSIS — I7409 Other arterial embolism and thrombosis of abdominal aorta: Secondary | ICD-10-CM

## 2013-06-01 DIAGNOSIS — J438 Other emphysema: Secondary | ICD-10-CM | POA: Diagnosis not present

## 2013-06-01 DIAGNOSIS — J449 Chronic obstructive pulmonary disease, unspecified: Secondary | ICD-10-CM

## 2013-06-01 DIAGNOSIS — J441 Chronic obstructive pulmonary disease with (acute) exacerbation: Secondary | ICD-10-CM | POA: Diagnosis not present

## 2013-06-01 DIAGNOSIS — I2510 Atherosclerotic heart disease of native coronary artery without angina pectoris: Secondary | ICD-10-CM | POA: Diagnosis not present

## 2013-06-01 MED ORDER — MOXIFLOXACIN HCL 400 MG PO TABS: 400.0000 mg | ORAL_TABLET | Freq: Every day | ORAL | Status: DC

## 2013-06-01 MED ORDER — IOHEXOL 350 MG/ML SOLN
80.0000 mL | Freq: Once | INTRAVENOUS | Status: AC | PRN
  Administered 2013-06-01: 80 mL via INTRAVENOUS

## 2013-06-01 NOTE — Progress Notes (Signed)
Subjective:    Patient ID: Chris Lewis, male    DOB: 05/09/1941, 72 y.o.   MRN: [id number]  HPI  This is a 72 y.o.  man with COPD    06/01/2013 Chief Complaint  Patient presents with  . Follow-up    CXR today.  Pt c/o prod cough with small tan to yellow mucous.  Sinus pressure, postnasal drip.  No SOB or tightness.  Pt feels "100%better today" from when he sched. the appt yesterday.  Noted 24hrs of cough, productive tan to yellow.  Now is better.  R nose running. No f/c/s. Pt has constipation, feels like has air below.  No regurgiation issues.   Review of Systems  11 pt Ros taken and neg except as per HPI    Objective:   Physical Exam   Filed Vitals:   06/01/13 1339  BP: 122/70  Pulse: 97  Temp: 98.3 F (36.8 C)  TempSrc: Oral  Height: 5\' 9"  (1.753 m)  Weight: 161 lb 12.8 oz (73.392 kg)  SpO2: 96%    Gen: Pleasant, well-nourished, in no distress,  normal affect  ENT: No lesions,  mouth clear,  oropharynx clear, no postnasal drip  Neck: No JVD, no TMG, no carotid bruits  Lungs: No use of accessory muscles, no dullness to percussion, distant bs, scattered rhonchi and expired wheezes  Cardiovascular: RRR, heart sounds normal, no murmur or gallops, no peripheral edema  Abdomen: soft and NT, no HSM,  BS normal  Musculoskeletal: No deformities, no cyanosis or clubbing  Neuro: alert, non focal  Skin: Warm, no lesions or rashes  Dg Chest 2 View  06/01/2013   CLINICAL DATA:  Cough, and congestion, former smoker, COPD  EXAM: CHEST  2 VIEW  COMPARISON:  02/19/2013  FINDINGS: Normal heart size and pulmonary vascularity post MVR.  Calcified tortuous aorta.  Emphysematous changes with minimal central peribronchial thickening consistent with COPD.  Calcified granulomata right lung.  Improved right upper lobe infiltrate.  No pleural effusion or pneumothorax.  No acute osseous findings.  IMPRESSION: Changes of COPD and old granulomatous disease.  Improved right upper lobe  scarring.  No acute abnormalities.   Electronically Signed   By: Ulyses Southward M.D.   On: 06/01/2013 13:41   Ct Angio Chest Aorta W/cm &/or Wo/cm  06/01/2013   ADDENDUM REPORT: 06/01/2013 13:08  ADDENDUM: These results will be called to the ordering clinician or representative by the Radiologist Assistant, and communication documented in the PACS Dashboard.   Electronically Signed   By: Malachy Moan M.D.   On: 06/01/2013 13:08   06/01/2013   CLINICAL DATA:  Peripheral arterial disease, chest pain and shortness of breath, prior mitral valve repair.  EXAM: CT ANGIOGRAPHY CHEST WITH CONTRAST  TECHNIQUE: Multidetector CT imaging of the chest was performed using the standard protocol during bolus administration of intravenous contrast. Multiplanar CT image reconstructions including MIPs were obtained to evaluate the vascular anatomy.  COMPARISON:  Prior chest x-ray 02/19/2013; most recent prior chest CT 06/06/2012  FINDINGS: Mediastinum: Unremarkable CT appearance of the thyroid gland. No suspicious mediastinal or hilar adenopathy. No soft tissue mediastinal mass. The thoracic esophagus is mildly patulous.  Heart/Vascular: Mildly dilated aortic root at 3.5 cm. Sino-tubular junction is within normal limits. The ascending thoracic aorta measures 3.6 cm. No abnormal dilatation in the transverse or descending thoracic aorta. Scattered atherosclerotic plaque. Conventional 3 vessel arch anatomy. The heart is within normal limits for size. There is mild right ventricular dilatation. Mitral valve annuloplasty  ring is present. No pericardial effusion. Atherosclerotic calcifications in the coronary arteries are noted.  Lungs/Pleura: Advanced predominantly centrilobular emphysema. Interval development of linear scarring and cicatrization in the right upper lobe. At the center is a 1.2 x 0.7 cm somewhat spiculated nodular opacity which was not identifiable on the prior examination. Stable calcified granuloma in the  periphery of the right lower lobe. The previously identified 4 mm nodule in the right upper lobe is again seen and remains unchanged.  Bones/Soft Tissues: No acute fracture or aggressive appearing lytic or blastic osseous lesion.  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 Otherwise, limited imaging of the upper abdomen is remarkable only for cholecystectomy and fatty infiltration of the liver with sparing at the gallbladder fossa.  Review of the MIP images confirms the above findings.  IMPRESSION: 1. Interval development of linear scarring and cicatrization in the right upper lobe at the center of which is a 1.2 cm somewhat spiculated nodular opacity. Differential considerations include a nodular pleural parenchymal scarring and a developing primary bronchogenic neoplasm with surrounding desmoid reaction. Given the advanced surrounding emphysema, this patient would be considered very higher risk for CT-guided biopsy. Recommend further evaluation with PET-CT. If the lesion proves to be hypermetabolic, endobronchial biopsy with electromagnetic guidance may be the preferred method of a tissue sampling. 2. Stable mild dilatation of the aortic root at 3.5 cm. 3. Atherosclerosis including coronary artery disease. 4. Mild right ventricular dilatation. 5. Advanced centrilobular emphysema.  CONTRAST:  LOWERED 1ST IS THE: CONTRAST:  LOWERED 1ST IS THE 80mL OMNIPAQUE IOHEXOL 350 MG/ML SOLN  Electronically Signed: By: Malachy Moan M.D. On: 06/01/2013 12:45       Assessment & Plan:   Gold stage C. 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 acetaminophen (TYLENOL) 160 MG/5ML  solution Take 650 mg by mouth every 6 (six) hours as needed for fever.      Marland Kitchen amoxicillin (AMOXIL) 500 MG capsule Take 500 mg by mouth. Prior to dental procedures      . aspirin 81 MG tablet Take 81 mg by mouth daily.        . Biotin 1000 MCG tablet Take 1,000 mcg by mouth daily.        . calcium carbonate (OS-CAL) 1250 MG chewable tablet Chew 2 tablets by mouth daily.       . calcium carbonate (TUMS EX) 750 MG chewable tablet Chew 1 tablet by mouth as needed. For gas and reflux.      . cetirizine (ZYRTEC) 10 MG tablet Take 10 mg by mouth daily as needed.       . chlorhexidine (PERIDEX) 0.12 % solution Swish with 1/2 ounce for 30 seconds twice daily      . Cholecalciferol (VITAMIN D3) 1000 UNITS CAPS Take 1,000 Units by mouth daily.       . clonazePAM (KLONOPIN) 0.5 MG tablet Take 0.25 mg by mouth at bedtime.       . diphenhydrAMINE (BENADRYL) 50 MG tablet Take 1 tablet (50 mg total) by mouth once.  1 tablet  0  . fluticasone (FLONASE) 50 MCG/ACT nasal spray Place 2 sprays into the nose daily.      . Lutein 6 MG CAPS Take  1 capsule by mouth daily.       . metroNIDAZOLE (METROGEL) 1 % gel Apply 1 application topically daily. Apply thin layer to affected area externally      . Multiple Vitamins-Minerals (ICAPS) CAPS Take 1 capsule by mouth 2 (two) times daily.      . ondansetron (ZOFRAN) 4 MG tablet Take 1 tablet by mouth daily as needed.      . ondansetron (ZOFRAN-ODT) 4 MG disintegrating tablet Take 4 mg by mouth every 8 (eight) hours as needed for nausea.      Marland Kitchen oxyCODONE (ROXICODONE) 5 MG/5ML solution Take by mouth every 4 (four) hours as needed for pain. Take 5-10 ml      . Polyethyl Glycol-Propyl Glycol (SYSTANE OP) Apply 1 drop to eye 2 (two) times daily.        . [EXPIRED] predniSONE (STERAPRED UNI-PAK) 10 MG tablet Take 1 tablet (10 mg total) by mouth daily. Take 5 tablets at 10 pm on Thursday night (10/16)...take 5 tablets at 4 am on Friday morning (10/17)...take 5 tablets at 10 am on  Friday morning (10/17)  15 tablet  0  . tiotropium (SPIRIVA HANDIHALER) 18 MCG inhalation capsule Place 1 capsule (18 mcg total) into inhaler and inhale daily.  30 capsule  11  . traMADol (ULTRAM) 50 MG tablet Take 1 tablet by mouth Every 6 hours as needed.      . VENTOLIN HFA 108 (90 BASE) MCG/ACT inhaler INHALE 2 PUFFS INTO THE LUNGS EVERY 4 (FOUR) HOURS AS NEEDED FOR WHEEZING.  18 each  6  . moxifloxacin (AVELOX) 400 MG tablet Take 1 tablet (400 mg total) by mouth daily.  5 tablet  0  . [DISCONTINUED] Chlorpheniramine Tannate 12 MG TABS Take 1 tablet (12 mg total) by mouth at bedtime as needed.  30 each  6  . [DISCONTINUED] polyethylene glycol (MIRALAX / GLYCOLAX) packet Take 17 g by mouth daily as needed.       No facility-administered encounter medications on file as of 06/01/2013.

## 2013-06-01 NOTE — Patient Instructions (Signed)
Avelox one daily for 5days No other medication changes Return 3 months

## 2013-06-02 NOTE — Assessment & Plan Note (Signed)
Gold C Copd with microaspiration and recurrent tracheobronchtisi Plan avelox x 5days

## 2013-06-04 ENCOUNTER — Ambulatory Visit (INDEPENDENT_AMBULATORY_CARE_PROVIDER_SITE_OTHER): Payer: BC Managed Care – PPO | Admitting: Thoracic Surgery (Cardiothoracic Vascular Surgery)

## 2013-06-04 ENCOUNTER — Encounter: Payer: Self-pay | Admitting: Thoracic Surgery (Cardiothoracic Vascular Surgery)

## 2013-06-04 VITALS — BP 128/82 | HR 67 | Resp 18 | Ht 69.0 in | Wt 156.0 lb

## 2013-06-04 DIAGNOSIS — R911 Solitary pulmonary nodule: Secondary | ICD-10-CM | POA: Diagnosis not present

## 2013-06-04 DIAGNOSIS — J189 Pneumonia, unspecified organism: Secondary | ICD-10-CM | POA: Diagnosis not present

## 2013-06-04 DIAGNOSIS — J984 Other disorders of lung: Secondary | ICD-10-CM | POA: Diagnosis not present

## 2013-06-04 HISTORY — DX: Pneumonia, unspecified organism: J18.9

## 2013-06-04 HISTORY — DX: Solitary pulmonary nodule: R91.1

## 2013-06-04 NOTE — Progress Notes (Signed)
[address]       [place] [address]             [phone]     CARDIOTHORACIC SURGERY OFFICE NOTE  Referring Provider is Armanda Magic, MD PCP is Lillia Mountain, MD   HPI:  Patient returns for followup of small solitary lung nodule originally discovered on routine CT scan performed prior to elective mitral valve repair 2 years ago. Since then the benign appearing nodule in the right lower lobe has remained stable. He was last seen here in the office on 06/12/2012.  The patient has long-standing history of COPD with recurrent bronchitis, achalasia, and severe chronic reflux. Over the past year he apparently underwent laparoscopic fundoplication in [facility].  This past week he developed acute onset of productive cough and malaise, and he was seen in the office 3 days ago by Dr. Delford Field who prescribed a short course of Avelox for tracheobronchitis.  Since then the patient has continued to improve. He states that his cough has not completely resolved but is much improved. He denies any fevers or chills. He no longer has problems with chronic reflux, although he is having a variety of abdominal complaints suggestive of gas bloat syndrome post fundoplication. Over the past year he has not had any symptoms or signs of congestive heart failure.  Current Outpatient Prescriptions  Medication Sig Dispense Refill  . acetaminophen (TYLENOL) 160 MG/5ML solution Take 650 mg by mouth every 6 (six) hours as needed for fever.      Marland Kitchen amoxicillin (AMOXIL) 500 MG capsule Take 500 mg by mouth. Prior to dental procedures      . aspirin 81 MG tablet Take 81 mg by mouth daily.        . Biotin 1000 MCG tablet Take 1,000 mcg by mouth daily.        . calcium carbonate (OS-CAL) 1250 MG chewable tablet Chew 2 tablets by mouth daily.       . calcium carbonate (TUMS EX) 750 MG chewable tablet Chew 1 tablet by mouth as needed. For gas and reflux.      . cetirizine (ZYRTEC) 10 MG tablet Take  10 mg by mouth daily as needed.       . chlorhexidine (PERIDEX) 0.12 % solution Swish with 1/2 ounce for 30 seconds twice daily      . Cholecalciferol (VITAMIN D3) 1000 UNITS CAPS Take 1,000 Units by mouth daily.       . clonazePAM (KLONOPIN) 0.5 MG tablet Take 0.25 mg by mouth at bedtime.       . diphenhydrAMINE (BENADRYL) 50 MG tablet Take 1 tablet (50 mg total) by mouth once.  1 tablet  0  . fluticasone (FLONASE) 50 MCG/ACT nasal spray Place 2 sprays into the nose daily.      . Lutein 6 MG CAPS Take 1 capsule by mouth daily.       . metroNIDAZOLE (METROGEL) 1 % gel Apply 1 application topically daily. Apply thin layer to affected area externally      . moxifloxacin (AVELOX) 400 MG tablet Take 1 tablet (400 mg total) by mouth daily.  5 tablet  0  . Multiple Vitamins-Minerals (ICAPS) CAPS Take 1 capsule by mouth 2 (two) times daily.      . ondansetron (ZOFRAN) 4 MG tablet Take 1 tablet by mouth daily as needed.      . ondansetron (ZOFRAN-ODT) 4 MG disintegrating tablet Take 4 mg by mouth every  8 (eight) hours as needed for nausea.      Marland Kitchen oxyCODONE (ROXICODONE) 5 MG/5ML solution Take by mouth every 4 (four) hours as needed for pain. Take 5-10 ml      . Polyethyl Glycol-Propyl Glycol (SYSTANE OP) Apply 1 drop to eye 2 (two) times daily.        Marland Kitchen tiotropium (SPIRIVA HANDIHALER) 18 MCG inhalation capsule Place 1 capsule (18 mcg total) into inhaler and inhale daily.  30 capsule  11  . traMADol (ULTRAM) 50 MG tablet Take 1 tablet by mouth Every 6 hours as needed.      . VENTOLIN HFA 108 (90 BASE) MCG/ACT inhaler INHALE 2 PUFFS INTO THE LUNGS EVERY 4 (FOUR) HOURS AS NEEDED FOR WHEEZING.  18 each  6   No current facility-administered medications for this visit.      Physical Exam:   BP 128/82  Pulse 67  Resp 18  Ht 5\' 9"  (1.753 m)  Wt 156 lb (70.761 kg)  BMI 23.03 kg/m2  SpO2 96%  General:  Well-appearing  Chest:   Clear to auscultation  CV:   Regular rate and rhythm without  murmur  Incisions:  n/a  Abdomen:  Soft and nontender  Extremities:  Warm and well-perfused  Diagnostic Tests:  CLINICAL DATA: Cough, and congestion, former smoker, COPD  EXAM:  CHEST 2 VIEW  COMPARISON: 02/19/2013  FINDINGS:  Normal heart size and pulmonary vascularity post MVR.  Calcified tortuous aorta.  Emphysematous changes with minimal central peribronchial thickening  consistent with COPD.  Calcified granulomata right lung.  Improved right upper lobe infiltrate.  No pleural effusion or pneumothorax.  No acute osseous findings.  IMPRESSION:  Changes of COPD and old granulomatous disease.  Improved right upper lobe scarring.  No acute abnormalities.  Electronically Signed  By: Ulyses Southward M.D.  On: 06/01/2013 13:41   CLINICAL DATA: Peripheral arterial disease, chest pain and  shortness of breath, prior mitral valve repair.  EXAM:  CT ANGIOGRAPHY CHEST WITH CONTRAST  TECHNIQUE:  Multidetector CT imaging of the chest was performed using the  standard protocol during bolus administration of intravenous  contrast. Multiplanar CT image reconstructions including MIPs were  obtained to evaluate the vascular anatomy.  COMPARISON: Prior chest x-ray 02/19/2013; most recent prior chest  CT 06/06/2012  FINDINGS:  Mediastinum: Unremarkable CT appearance of the thyroid gland. No  suspicious mediastinal or hilar adenopathy. No soft tissue  mediastinal mass. The thoracic esophagus is mildly patulous.  Heart/Vascular: Mildly dilated aortic root at 3.5 cm. Sino-tubular  junction is within normal limits. The ascending thoracic aorta  measures 3.6 cm. No abnormal dilatation in the transverse or  descending thoracic aorta. Scattered atherosclerotic plaque.  Conventional 3 vessel arch anatomy. The heart is within normal  limits for size. There is mild right ventricular dilatation. Mitral  valve annuloplasty ring is present. No pericardial effusion.  Atherosclerotic calcifications in  the coronary arteries are noted.  Lungs/Pleura: Advanced predominantly centrilobular emphysema.  Interval development of linear scarring and cicatrization in the  right upper lobe. At the center is a 1.2 x 0.7 cm somewhat  spiculated nodular opacity which was not identifiable on the prior  examination. Stable calcified granuloma in the periphery of the  right lower lobe. The previously identified 4 mm nodule in the right  upper lobe is again seen and remains unchanged.  Bones/Soft Tissues: No acute fracture or aggressive appearing lytic  or blastic osseous lesion.  Upper Abdomen: Stable misty jejunal mesentery with prominent lymph  nodes dating back to 2012. Stability suggests a benign process such  as sclerosing mesenteritis. Otherwise, limited imaging of the upper  abdomen is remarkable only for cholecystectomy and fatty  infiltration of the liver with sparing at the gallbladder fossa.  Review of the MIP images confirms the above findings.  IMPRESSION:  1. Interval development of linear scarring and cicatrization in the  right upper lobe at the center of which is a 1.2 cm somewhat  spiculated nodular opacity. Differential considerations include a  nodular pleural parenchymal scarring and a developing primary  bronchogenic neoplasm with surrounding desmoid reaction. Given the  advanced surrounding emphysema, this patient would be considered  very higher risk for CT-guided biopsy. Recommend further evaluation  with PET-CT. If the lesion proves to be hypermetabolic,  endobronchial biopsy with electromagnetic guidance may be the  preferred method of a tissue sampling.  2. Stable mild dilatation of the aortic root at 3.5 cm.  3. Atherosclerosis including coronary artery disease.  4. Mild right ventricular dilatation.  5. Advanced centrilobular emphysema.  CONTRAST: LOWERED 1ST IS THE:  CONTRAST: LOWERED 1ST IS THE  80mL OMNIPAQUE IOHEXOL 350 MG/ML SOLN  Electronically Signed:  By:  Malachy Moan M.D.  On: 06/01/2013 12:45   Impression:  The patient's routine followup CT scan of the chest just happened to be performed at the same time the patient had developed acute onset of productive cough system with tracheobronchitis. In comparison with CT scan performed 1 year ago the patient now has irregular opacity in the right upper lobe that under the circumstances most likely represents pneumonia. This will need followup to make sure it resolves. Benign-appearing lesion in the right lower lobe has not changed in probably no longer requires followup.    Plan:  The patient will return in 3 months or followup CT scan of chest with contrast. If the right upper lobe opacity has not improved PET/CT imaging will be performed.  We will plan to touch base with Dr. Delford Field to see whether or not it might be prudent to extend the patient's course of antibiotics longer than 5 days.   Salvatore Decent. Cornelius Moras, MD 06/04/2013 10:55 AM

## 2013-06-05 ENCOUNTER — Telehealth: Payer: Self-pay | Admitting: Critical Care Medicine

## 2013-06-05 MED ORDER — MOXIFLOXACIN HCL 400 MG PO TABS: 400.0000 mg | ORAL_TABLET | Freq: Every day | ORAL | Status: DC

## 2013-06-05 NOTE — Telephone Encounter (Signed)
Pt aware of rescs. RX has been sent.

## 2013-06-05 NOTE — Telephone Encounter (Signed)
Would give two more days of avelox for total 7days

## 2013-06-05 NOTE — Telephone Encounter (Signed)
Pt was seen by PW On 10/17 with cxr and was given avelox qd x 5 days.   Called, spoke with pt - Reports he had a CT Chest done prior to see PW on 10/17 (ordered by Dr. Cornelius Moras).  Pt was seen by Dr. Cornelius Moras yesterday and reports Dr. Cornelius Moras advised the CT Chest shows a light pna in right lung.  Pt has 1 more tablet left of the avelox to take today.  Pt states the sinus pressure and PND have improved some but still coughing a lot with tan to slight yellow mucus and had body aches yesterday.  No wheezing or increased SOB.  He would like to know if the 5 days of avelox will be sufficient for this.  Dr. Delford Field, pls advise.  Thank you.

## 2013-07-16 ENCOUNTER — Ambulatory Visit (INDEPENDENT_AMBULATORY_CARE_PROVIDER_SITE_OTHER): Payer: BC Managed Care – PPO | Admitting: Critical Care Medicine

## 2013-07-16 ENCOUNTER — Encounter: Payer: Self-pay | Admitting: Critical Care Medicine

## 2013-07-16 ENCOUNTER — Ambulatory Visit (INDEPENDENT_AMBULATORY_CARE_PROVIDER_SITE_OTHER)
Admission: RE | Admit: 2013-07-16 | Discharge: 2013-07-16 | Disposition: A | Discharge status: Home / Self Care | Payer: BC Managed Care – PPO | Source: Ambulatory Visit | Attending: Critical Care Medicine | Admitting: Critical Care Medicine

## 2013-07-16 VITALS — BP 100/70 | HR 69 | Temp 97.8°F | Ht 69.0 in | Wt 161.2 lb

## 2013-07-16 DIAGNOSIS — J441 Chronic obstructive pulmonary disease with (acute) exacerbation: Secondary | ICD-10-CM | POA: Diagnosis not present

## 2013-07-16 DIAGNOSIS — J209 Acute bronchitis, unspecified: Secondary | ICD-10-CM

## 2013-07-16 MED ORDER — FLUTICASONE PROPIONATE 50 MCG/ACT NA SUSP: 2.0000 | Freq: Every day | NASAL | Status: DC

## 2013-07-16 MED ORDER — MOXIFLOXACIN HCL 400 MG PO TABS: 400.0000 mg | ORAL_TABLET | Freq: Every day | ORAL | Status: DC

## 2013-07-16 NOTE — Progress Notes (Signed)
Subjective:    Patient ID: Chris Lewis, male    DOB: 1941-02-19, 72 y.o.   MRN: [id number]  HPI  This is a 72 y.o.  man with COPD   07/16/2013 Chief Complaint  Patient presents with  . Acute Visit    Prod cough with white to yellow, thick mucus, increased SOB, chest tightness/pain, body aches, and sweats.  Symptoms started on Thrusday.    Pt not completely clear from 05/2013.  Now more thick yellow mucus, notes body aches, sweats, weakness.  More dyspnea is noted. No sore throat.  Sl issue with swallowing. Diet not followed. Notes some sinus issues, feels bad up in nares.  Review of Systems Constitutional:   No  weight loss, night sweats,  Fevers, chills, fatigue, lassitude. HEENT:   No headaches,  Difficulty swallowing,  Tooth/dental problems,  Sore throat,                No sneezing, itching, ear ache, nasal congestion, post nasal drip,   CV:  No chest pain,  Orthopnea, PND, swelling in lower extremities, anasarca, dizziness, palpitations  GI  No heartburn, indigestion, abdominal pain, nausea, vomiting, diarrhea, change in bowel habits, loss of appetite  Resp: Notes shortness of breath with exertion and  at rest.  Notes excess mucus, notes productive cough,  No non-productive cough,  No coughing up of blood.  Notes change in color of mucus.  No wheezing.  No chest wall deformity  Skin: no rash or lesions.  GU: no dysuria, change in color of urine, no urgency or frequency.  No flank pain.  MS:  No joint pain or swelling.  No decreased range of motion.  No back pain.  Psych:  No change in mood or affect. No depression or anxiety.  No memory loss.     Objective:   Physical Exam   Filed Vitals:   07/16/13 1057  BP: 100/70  Pulse: 69  Temp: 97.8 F (36.6 C)  TempSrc: Oral  Height: 5\' 9"  (1.753 m)  Weight: 161 lb 3.2 oz (73.12 kg)  SpO2: 94%    Gen: Pleasant, well-nourished, in no distress,  normal affect  ENT: No lesions,  mouth clear,  oropharynx clear, no  postnasal drip  Neck: No JVD, no TMG, no carotid bruits  Lungs: No use of accessory muscles, no dullness to percussion, distant bs, increased scattered rhonchi and expired wheezes  Cardiovascular: RRR, heart sounds normal, no murmur or gallops, no peripheral edema  Abdomen: soft and NT, no HSM,  BS normal  Musculoskeletal: No deformities, no cyanosis or clubbing  Neuro: alert, non focal  Skin: Warm, no lesions or rashes  No results found.     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    Updated Medication List Outpatient Encounter Prescriptions as of 07/16/2013  Medication Sig  . amoxicillin (AMOXIL) 500 MG capsule Take 500 mg by mouth. Prior to dental procedures  . aspirin 81 MG tablet Take 81 mg by mouth daily.    . Biotin 1000 MCG tablet Take 1,000 mcg by mouth daily.    . calcium  carbonate (OS-CAL) 1250 MG chewable tablet Chew 2 tablets by mouth daily.   . calcium carbonate (TUMS EX) 750 MG chewable tablet Chew 1 tablet by mouth as needed. For gas and reflux.  . cetirizine (ZYRTEC) 10 MG tablet Take 10 mg by mouth daily as needed.   . Cholecalciferol (VITAMIN D3) 1000 UNITS CAPS Take 1,000 Units by mouth daily.   . clonazePAM (KLONOPIN) 0.5 MG tablet Take 0.25 mg by mouth at bedtime.   . fluticasone (FLONASE) 50 MCG/ACT nasal spray Place 2 sprays into both nostrils daily.  . Lutein 6 MG CAPS Take 1 capsule by mouth daily.   . metroNIDAZOLE (METROGEL) 1 % gel Apply 1 application topically daily. Apply thin layer to affected area externally   . Multiple Vitamins-Minerals (ICAPS) CAPS Take 1 capsule by mouth 2 (two) times daily.  . ondansetron (ZOFRAN) 4 MG tablet Take 1 tablet by mouth daily as needed.  . ondansetron (ZOFRAN-ODT) 4 MG disintegrating tablet Take 4 mg by mouth every 8 (eight) hours as needed for nausea.  Bertram Gala Glycol-Propyl Glycol (SYSTANE OP) Apply 1 drop to eye 2 (two) times daily.    Marland Kitchen tiotropium (SPIRIVA HANDIHALER) 18 MCG inhalation capsule Place 1 capsule (18 mcg total) into inhaler and inhale daily.  . traMADol (ULTRAM) 50 MG tablet Take 1 tablet by mouth Every 6 hours as needed.  . VENTOLIN HFA 108 (90 BASE) MCG/ACT inhaler INHALE 2 PUFFS INTO THE LUNGS EVERY 4 (FOUR) HOURS AS NEEDED FOR WHEEZING.  . [DISCONTINUED] fluticasone (FLONASE) 50 MCG/ACT nasal spray Place 2 sprays into the nose daily.  Marland Kitchen moxifloxacin (AVELOX) 400 MG tablet Take 1 tablet (400 mg total) by mouth daily.  . [DISCONTINUED] acetaminophen (TYLENOL) 160 MG/5ML solution Take 650 mg by mouth every 6 (six) hours as needed for fever.  . [DISCONTINUED] chlorhexidine (PERIDEX) 0.12 % solution Swish with 1/2 ounce for 30 seconds twice daily  . [DISCONTINUED] diphenhydrAMINE (BENADRYL) 50 MG tablet Take 1 tablet (50 mg total) by mouth once.  . [DISCONTINUED] moxifloxacin (AVELOX) 400 MG tablet Take 1 tablet (400 mg total) by mouth daily.  . [DISCONTINUED] oxyCODONE (ROXICODONE) 5 MG/5ML solution Take by mouth every 4 (four) hours as needed for pain. Take 5-10 ml

## 2013-07-16 NOTE — Assessment & Plan Note (Signed)
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

## 2013-07-16 NOTE — Progress Notes (Signed)
Quick Note:  Called, spoke with pt. Informed him of cxr results and recs per Dr. Wright. He verbalized understanding and voiced no further questions or concerns at this time. ______ 

## 2013-07-16 NOTE — Progress Notes (Signed)
Quick Note:  Notify the patient that the Xray is stable and no pneumonia No change in medications are recommended. Continue current meds as prescribed at last office visit ______ 

## 2013-07-16 NOTE — Patient Instructions (Signed)
Use flutter valve 2-3 times daily Follow strict reflux diet Avelox one daily for 7days Flonase refilled Chest xray today, will call results Return 1 month

## 2013-08-14 ENCOUNTER — Other Ambulatory Visit: Payer: Self-pay | Admitting: *Deleted

## 2013-08-14 DIAGNOSIS — R911 Solitary pulmonary nodule: Secondary | ICD-10-CM

## 2013-08-20 ENCOUNTER — Encounter: Payer: Self-pay | Admitting: Critical Care Medicine

## 2013-08-20 ENCOUNTER — Ambulatory Visit (INDEPENDENT_AMBULATORY_CARE_PROVIDER_SITE_OTHER): Payer: BC Managed Care – PPO | Admitting: Critical Care Medicine

## 2013-08-20 VITALS — BP 108/76 | HR 72 | Temp 97.7°F | Ht 69.0 in | Wt 164.0 lb

## 2013-08-20 DIAGNOSIS — J441 Chronic obstructive pulmonary disease with (acute) exacerbation: Secondary | ICD-10-CM | POA: Diagnosis not present

## 2013-08-20 MED ORDER — TIOTROPIUM BROMIDE MONOHYDRATE 18 MCG IN CAPS: 18.0000 ug | ORAL_CAPSULE | Freq: Every day | RESPIRATORY_TRACT | Status: DC

## 2013-08-20 NOTE — Progress Notes (Signed)
Subjective:    Patient ID: Chris Lewis, male    DOB: [birth year], 73 y.o.   MRN: [id number]  HPI  This is a 73 y.o.  man with COPD   07/16/2013 Chief Complaint  Patient presents with  . Acute Visit    Prod cough with white to yellow, thick mucus, increased SOB, chest tightness/pain, body aches, and sweats.  Symptoms started on Thrusday.    Pt not completely clear from 05/2013.  Now more thick yellow mucus, notes body aches, sweats, weakness.  More dyspnea is noted. No sore throat.  Sl issue with swallowing. Diet not followed. Notes some sinus issues, feels bad up in nares.  08/20/2013 Chief Complaint  Patient presents with  . 1 month follow up    has nasal and chest congestion, cough with light yellowish [place] mucus, and PND.  SOB unchanged.  No f/c/s.  Pt improved from last ov until 3 days ago, some nasal congestion and nose running.  Mucus is light yellow and [place]. No sinus pressure, but is congested.  No f/c/s. Notes min yellow and [place] mucus.  Last ABX avelox helped .  Mild sore throat.    Review of Systems Constitutional:   No  weight loss, night sweats,  Fevers, chills, fatigue, lassitude. HEENT:   No headaches,  Difficulty swallowing,  Tooth/dental problems,  Sore throat,                No sneezing, itching, ear ache, nasal congestion, post nasal drip,   CV:  No chest pain,  Orthopnea, PND, swelling in lower extremities, anasarca, dizziness, palpitations  GI  No heartburn, indigestion, abdominal pain, nausea, vomiting, diarrhea, change in bowel habits, loss of appetite  Resp: Notes shortness of breath with exertion and  at rest.  Notes excess mucus, notes productive cough,  No non-productive cough,  No coughing up of blood.  Notes change in color of mucus.  No wheezing.  No chest wall deformity  Skin: no rash or lesions.  GU: no dysuria, change in color of urine, no urgency or frequency.  No flank pain.  MS:  No joint pain or swelling.  No decreased range of motion.  No  back pain.  Psych:  No change in mood or affect. No depression or anxiety.  No memory loss.     Objective:   Physical Exam   Filed Vitals:   08/20/13 1350  BP: 108/76  Pulse: 72  Temp: 97.7 F (36.5 C)  TempSrc: Oral  Height: 5\' 9"  (1.753 m)  Weight: 164 lb (74.39 kg)  SpO2: 95%    Gen: Pleasant, well-nourished, in no distress,  normal affect  ENT: No lesions,  mouth clear,  oropharynx clear, no postnasal drip  Neck: No JVD, no TMG, no carotid bruits  Lungs: No use of accessory muscles, no dullness to percussion, distant bs, increased scattered rhonchi and expired wheezes  Cardiovascular: RRR, heart sounds normal, no murmur or gallops, no peripheral edema  Abdomen: soft and NT, no HSM,  BS normal  Musculoskeletal: No deformities, no cyanosis or clubbing  Neuro: alert, non focal  Skin: Warm, no lesions or rashes  No results found.     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 Return in  4 months   Updated Medication List Outpatient Encounter Prescriptions as  of 08/20/2013  Medication Sig  . amoxicillin (AMOXIL) 500 MG capsule Take 500 mg by mouth. Prior to dental procedures  . aspirin 81 MG tablet Take 81 mg by mouth daily.    . Biotin 1000 MCG tablet Take 1,000 mcg by mouth daily.    . calcium carbonate (OS-CAL) 1250 MG chewable tablet Chew 2 tablets by mouth daily.   . calcium carbonate (TUMS EX) 750 MG chewable tablet Chew 1 tablet by mouth as needed. For gas and reflux.  . cetirizine (ZYRTEC) 10 MG tablet Take 10 mg by mouth daily as needed.   . Cholecalciferol (VITAMIN D3) 1000 UNITS CAPS Take 1,000 Units by mouth daily.   . clonazePAM (KLONOPIN) 0.5 MG tablet Take 0.25 mg by mouth at bedtime.   . fluticasone (FLONASE) 50 MCG/ACT nasal spray  Place 2 sprays into both nostrils daily.  . Lutein 6 MG CAPS Take 1 capsule by mouth daily.   . metroNIDAZOLE (METROGEL) 1 % gel Apply 1 application topically daily. Apply thin layer to affected area externally  . Multiple Vitamins-Minerals (ICAPS) CAPS Take 1 capsule by mouth 2 (two) times daily.  . ondansetron (ZOFRAN) 4 MG tablet Take 1 tablet by mouth daily as needed.  . ondansetron (ZOFRAN-ODT) 4 MG disintegrating tablet Take 4 mg by mouth every 8 (eight) hours as needed for nausea.  Vladimir Faster Glycol-Propyl Glycol (SYSTANE OP) Apply 1 drop to eye 2 (two) times daily.    Marland Kitchen tiotropium (SPIRIVA HANDIHALER) 18 MCG inhalation capsule Place 1 capsule (18 mcg total) into inhaler and inhale daily.  . traMADol (ULTRAM) 50 MG tablet Take 1 tablet by mouth Every 6 hours as needed.  . VENTOLIN HFA 108 (90 BASE) MCG/ACT inhaler INHALE 2 PUFFS INTO THE LUNGS EVERY 4 (FOUR) HOURS AS NEEDED FOR WHEEZING.  . [DISCONTINUED] tiotropium (SPIRIVA HANDIHALER) 18 MCG inhalation capsule Place 1 capsule (18 mcg total) into inhaler and inhale daily.  . [DISCONTINUED] moxifloxacin (AVELOX) 400 MG tablet Take 1 tablet (400 mg total) by mouth daily.

## 2013-08-20 NOTE — Patient Instructions (Signed)
No change in medications. Return in   3 months 

## 2013-08-20 NOTE — Assessment & Plan Note (Signed)
Gold C copd with severe airflow obstruction, recurrent exac d/t esophageal dysfunction and aspiration Plan No change in inhaled or maintenance medications. Return in  4 months

## 2013-08-21 ENCOUNTER — Other Ambulatory Visit: Payer: Self-pay | Admitting: *Deleted

## 2013-08-22 ENCOUNTER — Other Ambulatory Visit: Payer: Self-pay | Admitting: *Deleted

## 2013-08-22 DIAGNOSIS — Z91041 Radiographic dye allergy status: Secondary | ICD-10-CM

## 2013-08-22 MED ORDER — DIPHENHYDRAMINE HCL 25 MG PO CAPS: 50.0000 mg | ORAL_CAPSULE | Freq: Four times a day (QID) | ORAL | Status: DC | PRN

## 2013-08-22 MED ORDER — PREDNISONE (PAK) 10 MG PO TABS: ORAL_TABLET | Freq: Every day | ORAL | Status: DC

## 2013-09-04 LAB — BUN: BUN: 15 mg/dL (ref 6–23)

## 2013-09-04 LAB — CREATININE, SERUM: Creat: 1.04 mg/dL (ref 0.50–1.35)

## 2013-09-07 ENCOUNTER — Ambulatory Visit
Admission: RE | Admit: 2013-09-07 | Discharge: 2013-09-07 | Disposition: A | Discharge status: Home / Self Care | Payer: BC Managed Care – PPO | Source: Ambulatory Visit | Attending: Thoracic Surgery (Cardiothoracic Vascular Surgery) | Admitting: Thoracic Surgery (Cardiothoracic Vascular Surgery)

## 2013-09-07 DIAGNOSIS — J438 Other emphysema: Secondary | ICD-10-CM | POA: Diagnosis not present

## 2013-09-07 DIAGNOSIS — R911 Solitary pulmonary nodule: Secondary | ICD-10-CM

## 2013-09-07 MED ORDER — IOHEXOL 300 MG/ML  SOLN
75.0000 mL | Freq: Once | INTRAMUSCULAR | Status: AC | PRN
Start: 2013-09-07 — End: 2013-09-07
  Administered 2013-09-07: 75 mL via INTRAVENOUS

## 2013-09-10 ENCOUNTER — Encounter: Payer: Self-pay | Admitting: Thoracic Surgery (Cardiothoracic Vascular Surgery)

## 2013-09-10 ENCOUNTER — Ambulatory Visit (INDEPENDENT_AMBULATORY_CARE_PROVIDER_SITE_OTHER): Payer: BC Managed Care – PPO | Admitting: Thoracic Surgery (Cardiothoracic Vascular Surgery)

## 2013-09-10 VITALS — BP 120/77 | HR 72 | Temp 97.0°F | Resp 20 | Ht 69.0 in | Wt 164.0 lb

## 2013-09-10 DIAGNOSIS — Z9889 Other specified postprocedural states: Secondary | ICD-10-CM

## 2013-09-10 DIAGNOSIS — R911 Solitary pulmonary nodule: Secondary | ICD-10-CM | POA: Diagnosis not present

## 2013-09-10 DIAGNOSIS — J984 Other disorders of lung: Secondary | ICD-10-CM

## 2013-09-10 NOTE — Progress Notes (Signed)
[address]       [place] [address]             [phone]     CARDIOTHORACIC SURGERY OFFICE NOTE  Referring Provider is Elsie Stain, MD PCP is Irven Shelling, MD   HPI:  Patient returns for followup of solitary lung lesion noted on CT scan this past October. The patient has remote history of tobacco use with long-standing history of COPD and recurrent bronchitis with chronic aspiration and history of achalasia. Last October he developed pneumonia at that time he underwent routine followup CT scan of the chest because of a benign-appearing nodule in the right lower lobe. CT scan performed 06/04/2013 confirmed stability of the benign-appearing nodule in the right lower lobe but revealed a new somewhat ill-defined area of opacity in the right upper lobe which was not present previously.  His symptoms of pneumonia resolved and since then the patient has done well clinically. He returns for routine followup today. He states that 2 weeks ago he had a brief febrile illness but he has not had any recent increased cough.   Current Outpatient Prescriptions  Medication Sig Dispense Refill  . amoxicillin (AMOXIL) 500 MG capsule Take 500 mg by mouth. Prior to dental procedures      . aspirin 81 MG tablet Take 81 mg by mouth daily.        . Biotin 1000 MCG tablet Take 1,000 mcg by mouth daily.        . calcium carbonate (OS-CAL) 1250 MG chewable tablet Chew 2 tablets by mouth daily.       . calcium carbonate (TUMS EX) 750 MG chewable tablet Chew 1 tablet by mouth as needed. For gas and reflux.      . cetirizine (ZYRTEC) 10 MG tablet Take 10 mg by mouth daily as needed.       . Cholecalciferol (VITAMIN D3) 1000 UNITS CAPS Take 1,000 Units by mouth daily.       . clonazePAM (KLONOPIN) 0.5 MG tablet Take 0.25 mg by mouth at bedtime.       . fluticasone (FLONASE) 50 MCG/ACT nasal spray Place 2 sprays into both nostrils daily.  16 g  6  . Lutein 6 MG CAPS Take 1  capsule by mouth daily.       . metroNIDAZOLE (METROGEL) 1 % gel Apply 1 application topically daily. Apply thin layer to affected area externally      . Multiple Vitamins-Minerals (ICAPS) CAPS Take 1 capsule by mouth 2 (two) times daily.      . ondansetron (ZOFRAN) 4 MG tablet Take 1 tablet by mouth daily as needed.      . ondansetron (ZOFRAN-ODT) 4 MG disintegrating tablet Take 4 mg by mouth every 8 (eight) hours as needed for nausea.      Vladimir Faster Glycol-Propyl Glycol (SYSTANE OP) Apply 1 drop to eye 2 (two) times daily.        Marland Kitchen tiotropium (SPIRIVA HANDIHALER) 18 MCG inhalation capsule Place 1 capsule (18 mcg total) into inhaler and inhale daily.  30 capsule  11  . traMADol (ULTRAM) 50 MG tablet Take 1 tablet by mouth Every 6 hours as needed.      . VENTOLIN HFA 108 (90 BASE) MCG/ACT inhaler INHALE 2 PUFFS INTO THE LUNGS EVERY 4 (FOUR) HOURS AS NEEDED FOR WHEEZING.  18 each  6   No current facility-administered medications for this visit.      Physical  Exam:   BP 120/77  Pulse 72  Temp(Src) 97 F (36.1 C) (Oral)  Resp 20  Ht 5\' 9"  (1.753 m)  Wt 164 lb (74.39 kg)  BMI 24.21 kg/m2  SpO2 97%  General:  Well-appearing  Chest:   Clear to auscultation  CV:   Regular rate and rhythm without murmur  Incisions:  n/a  Abdomen:  Soft and nontender  Extremities:  Warm and well-perfused  Diagnostic Tests:  CT CHEST WITH CONTRAST  TECHNIQUE:  Multidetector CT imaging of the chest was performed during  intravenous contrast administration.  CONTRAST: 75 cc Omnipaque 300 IV.  COMPARISON: 06/01/2013  FINDINGS:  Advanced centrilobular emphysema changes again noted. Density in the  right upper lobe is again seen. This likely reflects area of  scarring. Centrally within this density, there is a more  solid-appearing nodular area. This measures 1.3 x 0.8 cm on image 19  compared with 1.2 x 0.7 cm previously, not significantly changed.  This is measured on image 19 of series 4.    Calcified granuloma noted in the right lower lobe. Areas of linear  scarring in the lung bases. There are clustered ground-glass and  tree-in-bud nodular densities noted in the posterior lingula can be  adjacent left lower lobe, new since prior study. These are most  likely inflammatory, likely representing small airways disease/  alveolitis. No new suspicious pulmonary nodules. No pleural  effusions.  Heart is normal size. The aortic root remains slightly prominent at  3.6 cm compared with 3.5 cm previously. No evidence of aortic  dissection. No mediastinal, hilar, or axillary adenopathy. Chest  wall soft tissues are unremarkable. Imaging into the upper abdomen  shows no acute findings.  No acute bony abnormality.  IMPRESSION:  Advanced centrilobular emphysema.  Persistent area of scarring in the right upper lobe. Persistent  somewhat spiculated appearing solid nodular component centrally  which is unchanged since prior study. Again, given the short  interval follow-up, differential remains the same with an area of  nodular scarring versus bronchogenic neoplasm. PET CT may be  beneficial for further characterization as discussed previously.  Electronically Signed  By: Rolm Baptise M.D.  On: 09/07/2013 12:14     Impression:  Persistent area of opacity in the right upper lobe most likely consistent with scarring from episode of pneumonia last fall. However, the small, somewhat spiculated central area of opacity has not completely resolved and warrants continued close follow up.  Plan:  The patient will return in 3 months with a PET CT scan to evaluate this area further.   Valentina Gu. Roxy Manns, MD 09/10/2013 3:06 PM

## 2013-09-11 ENCOUNTER — Telehealth: Payer: Self-pay | Admitting: Critical Care Medicine

## 2013-09-11 NOTE — Telephone Encounter (Signed)
Spoke with pt. Reports that back in 05/2013 at CT was done of his chest by his cardiologist Dr. Roxy Manns (in [place]). It showed a lung nodule on his R lung. CT was repeated in 07/2013 and nodule was still present but it had not changed. Dr. Roxy Manns is going to do a PET scan in March to make sure nothing has changed with the nodule. Pt wanted PW to be aware of this and was wondering if there is anything else he needs to be doing. Does he need to come in and be seen?  Recall has been placed for ROV in March 2015.  PW - please advise. Thanks.

## 2013-09-11 NOTE — Telephone Encounter (Signed)
LMOMTCB x 1 

## 2013-09-11 NOTE — Telephone Encounter (Signed)
Nothing to add, i saw the note by owen

## 2013-09-11 NOTE — Telephone Encounter (Signed)
Pt is aware that there is nothing that needs to be added. He is aware that his ROV with PW is due in March. I offered to go ahead and schedule this for him, he declines at this time and will call back to schedule this.

## 2013-09-11 NOTE — Telephone Encounter (Signed)
Patient returning call.

## 2013-10-16 DIAGNOSIS — L819 Disorder of pigmentation, unspecified: Secondary | ICD-10-CM | POA: Diagnosis not present

## 2013-10-16 DIAGNOSIS — D235 Other benign neoplasm of skin of trunk: Secondary | ICD-10-CM | POA: Diagnosis not present

## 2013-10-16 DIAGNOSIS — D1801 Hemangioma of skin and subcutaneous tissue: Secondary | ICD-10-CM | POA: Diagnosis not present

## 2013-10-30 ENCOUNTER — Other Ambulatory Visit: Payer: Self-pay | Admitting: *Deleted

## 2013-10-30 DIAGNOSIS — D381 Neoplasm of uncertain behavior of trachea, bronchus and lung: Secondary | ICD-10-CM

## 2013-11-15 ENCOUNTER — Other Ambulatory Visit: Payer: Self-pay | Admitting: Critical Care Medicine

## 2013-11-29 ENCOUNTER — Encounter (HOSPITAL_COMMUNITY): Payer: Self-pay

## 2013-11-29 ENCOUNTER — Ambulatory Visit (HOSPITAL_COMMUNITY)
Admission: RE | Admit: 2013-11-29 | Discharge: 2013-11-29 | Disposition: A | Discharge status: Home / Self Care | Payer: BC Managed Care – PPO | Source: Ambulatory Visit | Attending: Thoracic Surgery (Cardiothoracic Vascular Surgery) | Admitting: Thoracic Surgery (Cardiothoracic Vascular Surgery)

## 2013-11-29 DIAGNOSIS — D381 Neoplasm of uncertain behavior of trachea, bronchus and lung: Secondary | ICD-10-CM

## 2013-11-29 DIAGNOSIS — I2510 Atherosclerotic heart disease of native coronary artery without angina pectoris: Secondary | ICD-10-CM | POA: Diagnosis not present

## 2013-11-29 DIAGNOSIS — K5730 Diverticulosis of large intestine without perforation or abscess without bleeding: Secondary | ICD-10-CM | POA: Diagnosis not present

## 2013-11-29 DIAGNOSIS — J438 Other emphysema: Secondary | ICD-10-CM | POA: Insufficient documentation

## 2013-11-29 DIAGNOSIS — R911 Solitary pulmonary nodule: Secondary | ICD-10-CM | POA: Diagnosis not present

## 2013-11-29 DIAGNOSIS — R918 Other nonspecific abnormal finding of lung field: Secondary | ICD-10-CM | POA: Insufficient documentation

## 2013-11-29 DIAGNOSIS — R222 Localized swelling, mass and lump, trunk: Secondary | ICD-10-CM | POA: Insufficient documentation

## 2013-11-29 LAB — GLUCOSE, CAPILLARY: Glucose-Capillary: 95 mg/dL (ref 70–99)

## 2013-11-29 MED ORDER — FLUDEOXYGLUCOSE F - 18 (FDG) INJECTION
8.0000 | Freq: Once | INTRAVENOUS | Status: AC | PRN
  Administered 2013-11-29: 8 via INTRAVENOUS

## 2013-12-03 ENCOUNTER — Other Ambulatory Visit: Payer: Self-pay

## 2013-12-03 ENCOUNTER — Ambulatory Visit (INDEPENDENT_AMBULATORY_CARE_PROVIDER_SITE_OTHER): Payer: BC Managed Care – PPO | Admitting: Thoracic Surgery (Cardiothoracic Vascular Surgery)

## 2013-12-03 ENCOUNTER — Encounter: Payer: Self-pay | Admitting: Thoracic Surgery (Cardiothoracic Vascular Surgery)

## 2013-12-03 VITALS — BP 116/70 | HR 75 | Resp 16 | Ht 69.0 in | Wt 164.0 lb

## 2013-12-03 DIAGNOSIS — J984 Other disorders of lung: Secondary | ICD-10-CM

## 2013-12-03 DIAGNOSIS — Z9889 Other specified postprocedural states: Secondary | ICD-10-CM

## 2013-12-03 DIAGNOSIS — I059 Rheumatic mitral valve disease, unspecified: Secondary | ICD-10-CM

## 2013-12-03 DIAGNOSIS — D381 Neoplasm of uncertain behavior of trachea, bronchus and lung: Secondary | ICD-10-CM

## 2013-12-03 DIAGNOSIS — R911 Solitary pulmonary nodule: Secondary | ICD-10-CM

## 2013-12-03 NOTE — Patient Instructions (Signed)
Schedule an appointment with Dr Joya Gaskins as soon as practical

## 2013-12-03 NOTE — Progress Notes (Addendum)
[address]       [place] [address]             [phone]     CARDIOTHORACIC SURGERY CONSULTATION REPORT  Referring Provider is Sueanne Margarita, MD PCP is Irven Shelling, MD  Chief Complaint  Patient presents with  . Lung Lesion    3 month f/u...RLLobe ...with a  PET SCAN    HPI:  Patient returns for followup of solitary lung lesion noted on CT scan this past October. The patient has remote history of tobacco use with long-standing history of COPD and recurrent bronchitis with chronic aspiration and history of achalasia.  He is followed chronically by Dr. Joya Gaskins.  Last October he developed pneumonia at that time he underwent routine followup CT scan of the chest because of a benign-appearing nodule in the right lower lobe. CT scan performed 06/04/2013 confirmed stability of the benign-appearing nodule in the right lower lobe but revealed a new somewhat ill-defined area of opacity in the right upper lobe which was not present previously.  Repeat CT scan performed 09/07/2013 revealed persistent area of opacity in the right upper lobe felt most likely to be consistent with scarring from his previous episode of pneumonia, although a small somewhat spiculated appearance within the opacity appeared solid and had not completely resolved. We subsequently planned followup PET CT imaging in 3 months and this was performed last week. The patient returns to discuss the results of this test.  The patient reports stable symptoms of mild exertional shortness of breath. He remains fairly active physically for a gentleman his age and overall he has no significant limitations. His exercise tolerance is fairly good. He denies any history of chest pain or chest tightness either with activity or at rest.  He states that his problems with cough have now essentially completely resolved. He has not had any further problems with dysphagia or regurgitation since he underwent  laparoscopic Heller myotomy last summer.  He states that he has been a bit anxious recently both because of concerns regarding his underlying lung nodule as well as because of the fact that his wife is having some health problems.   Past Medical History  Diagnosis Date  . Achalasia   . Hypercholesterolemia   . Seasonal allergic rhinitis   . Transient ischemic attack   . Mitral valve regurgitation   . Osteopenia   . FH: cholecystectomy   . S/P right inguinal herniorrhaphy   . MVP (mitral valve prolapse)   . MR (mitral regurgitation)   . GERD (gastroesophageal reflux disease)   . Shortness of breath     sometimes at rest  . Stroke 10/2002  . Arthritis   . Anxiety     causes shortness  . Hiatal hernia   . Depression   . Chronic obstructive pulmonary disease   . COPD (chronic obstructive pulmonary disease)   . Pneumonia 06/04/2013  . Lesion of right lung 06/04/2013    Past Surgical History  Procedure Laterality Date  . Back surgery      x 3  . Foot surgery    . Esophagogastroduodenoscopy and savary esophageal dilation.      x9  . Eyebrow surgery  2012  . Rt inguinal herniorrhaphy  1969/1997  . Esophagoscopy w/ botox injection      x9 beginning 2008, most recent Sept. 2012  . Tee without cardioversion  07/06/2011    Procedure: TRANSESOPHAGEAL ECHOCARDIOGRAM (TEE);  Surgeon: Tressia Miners  Remonia Richter, MD;  Location: [facility];  Service: Cardiovascular;  Laterality: N/A;  . Cholecystectomy    . Mitral valve repair  08/03/2011    Procedure: MINIMALLY INVASIVE MITRAL VALVE REPAIR (MVR);  Surgeon: Rexene Alberts, MD;  Location: [facility];  Service: Open Heart Surgery;  Laterality: Right;  minimally invasive MVR  . Esophagogastroduodenoscopy  01/13/2012    Procedure: ESOPHAGOGASTRODUODENOSCOPY (EGD);  Surgeon: Garlan Fair, MD;  Location: [facility];  Service: Endoscopy;  Laterality: N/A;  botox /katrina  . Esophagogastroduodenoscopy  06/27/2012    Procedure: ESOPHAGOGASTRODUODENOSCOPY  (EGD);  Surgeon: Garlan Fair, MD;  Location: [facility];  Service: Endoscopy;  Laterality: N/A;  . Botox injection  06/27/2012    Procedure: BOTOX INJECTION;  Surgeon: Garlan Fair, MD;  Location: [facility];  Service: Endoscopy;  Laterality: N/A;  . Esophagogastroduodenoscopy (egd) with esophageal dilation N/A 11/22/2012    Procedure: ESOPHAGOGASTRODUODENOSCOPY (EGD) WITH BOTOX INJECTION;  Surgeon: Garlan Fair, MD;  Location: [facility];  Service: Endoscopy;  Laterality: N/A;    Family History  Problem Relation Age of Onset  . Stroke Father 54    cva  . Scleroderma Mother 65  . Lung cancer Sister   . Cancer Sister   . Heart disease Sister   . Anesthesia problems Neg Hx     History   Social History  . Marital Status: Married    Spouse Name: N/A    Number of Children: 2  . Years of Education: N/A   Occupational History  .     Social History Main Topics  . Smoking status: Former Smoker -- 1.50 packs/day for 38 years    Types: Cigarettes    Quit date: 08/29/1997  . Smokeless tobacco: Never Used  . Alcohol Use: No  . Drug Use: No  . Sexual Activity: Not on file   Other Topics Concern  . Not on file   Social History Narrative  . No narrative on file    Current Outpatient Prescriptions  Medication Sig Dispense Refill  . amoxicillin (AMOXIL) 500 MG capsule Take 500 mg by mouth. Prior to dental procedures      . aspirin 81 MG tablet Take 81 mg by mouth daily.        . Biotin 1000 MCG tablet Take 1,000 mcg by mouth daily.        . calcium carbonate (OS-CAL) 1250 MG chewable tablet Chew 2 tablets by mouth daily.       . calcium carbonate (TUMS EX) 750 MG chewable tablet Chew 1 tablet by mouth as needed. For gas and reflux.      . cetirizine (ZYRTEC) 10 MG tablet Take 10 mg by mouth daily as needed.       . Cholecalciferol (VITAMIN D3) 1000 UNITS CAPS Take 1,000 Units by mouth daily.       . clonazePAM (KLONOPIN) 0.5 MG tablet Take 0.25 mg by mouth at  bedtime.       . fluticasone (FLONASE) 50 MCG/ACT nasal spray Place 2 sprays into both nostrils daily.  16 g  6  . Lutein 6 MG CAPS Take 1 capsule by mouth daily.       . metroNIDAZOLE (METROGEL) 1 % gel Apply 1 application topically daily. Apply thin layer to affected area externally      . Multiple Vitamins-Minerals (ICAPS) CAPS Take 1 capsule by mouth 2 (two) times daily.      . ondansetron (ZOFRAN) 4 MG tablet Take 1 tablet by  mouth daily as needed.      . ondansetron (ZOFRAN-ODT) 4 MG disintegrating tablet Take 4 mg by mouth every 8 (eight) hours as needed for nausea.      Vladimir Faster Glycol-Propyl Glycol (SYSTANE OP) Apply 1 drop to eye 2 (two) times daily.        Marland Kitchen tiotropium (SPIRIVA HANDIHALER) 18 MCG inhalation capsule Place 1 capsule (18 mcg total) into inhaler and inhale daily.  30 capsule  11  . traMADol (ULTRAM) 50 MG tablet Take 1 tablet by mouth Every 6 hours as needed.      . VENTOLIN HFA 108 (90 BASE) MCG/ACT inhaler INHALE 2 PUFFS INTO THE LUNGS EVERY 4 (FOUR) HOURS AS NEEDED FOR WHEEZING.  18 each  6   No current facility-administered medications for this visit.    Allergies  Allergen Reactions  . Contrast Media [Iodinated Diagnostic Agents] Rash    Heavy rash and itching 20 yrs ago. Needs full premeds and does well with this.  . Atorvastatin     REACTION: muscle pain REACTION: muscle pain  . Propoxyphene N-Acetaminophen Itching    rash  . Rosuvastatin     REACTION: muscle weakness  . Hydrocodone Rash    rash rash  . Metrizamide Rash    Heavy rash and itching 20 yrs ago. Needs full premeds and does well with this.      Review of Systems:   General:  normal appetite, decreased energy, no weight gain, no weight loss, no fever  Cardiac:  no chest pain with exertion, no chest pain at rest, + SOB with strenuous exertion, no resting SOB, no PND, no orthopnea, no palpitations, no arrhythmia, no atrial fibrillation, no LE edema, no dizzy spells, no  syncope  Respiratory:  no shortness of breath, no home oxygen, occasional productive cough, no dry cough, + bronchitis, no wheezing, no hemoptysis, no asthma, no pain with inspiration or cough, no sleep apnea, no CPAP at night  GI:   no difficulty swallowing, no reflux, no frequent heartburn, no hiatal hernia, no abdominal pain, no constipation, no diarrhea, no hematochezia, no hematemesis, no melena  GU:   no dysuria,  no frequency, no urinary tract infection, no hematuria, no enlarged prostate, no kidney stones, no kidney disease  Vascular:  no pain suggestive of claudication, no pain in feet, no leg cramps, no varicose veins, no DVT, no non-healing foot ulcer  Neuro:   no stroke, + TIA's, no seizures, no headaches, no temporary blindness one eye,  no slurred speech, no peripheral neuropathy, no chronic pain, no instability of gait, no memory/cognitive dysfunction  Musculoskeletal: no arthritis, no joint swelling, + myalgias, no difficulty walking, normal mobility   Skin:   + rash, no itching, no skin infections, no pressure sores or ulcerations  Psych:   + anxiety, no depression, no nervousness, + unusual recent stress  Eyes:   no blurry vision, no floaters, no rec+th, no dentures, last saw dentist last week  Hematologic:  + easy bruising, no abnormal bleeding, no clotting disorder, no frequent epistaxis  Endocrine:  no diabetes, does not check CBG's at home     Physical Exam:   BP 116/70  Pulse 75  Resp 16  Ht 5\' 9"  (1.753 m)  Wt 164 lb (74.39 kg)  BMI 24.21 kg/m2  SpO2 94%  General:    well-appearing  HEENT:  Unremarkable   Neck:   no JVD, no bruits, no adenopathy   Chest:   clear to auscultation, symmetrical  breath sounds, no wheezes, no rhonchi   CV:   RRR, no murmur   Abdomen:  soft, non-tender, no masses   Extremities:  warm, well-perfused, pulses palpable, no LE edema  Rectal/GU  Deferred  Neuro:   Grossly non-focal and symmetrical throughout  Skin:   Clean and dry, no  rashes, no breakdown   Diagnostic Tests:   NUCLEAR MEDICINE PET SKULL BASE TO THIGH   TECHNIQUE: 8.0 mCi F-18 FDG was injected intravenously. Full-ring PET imaging was performed from the skull base to thigh after the radiotracer. CT data was obtained and used for attenuation correction and anatomic localization.   FASTING BLOOD GLUCOSE:  Value: 95 mg/dl   COMPARISON:  Chest CT 09/07/2013.   FINDINGS: NECK   No hypermetabolic lymph nodes in the neck.   CHEST   Continued interval enlargement of a mixed solid and subsolid lesion in the right upper lobe. Specifically, today's examination demonstrates a ground-glass attenuation mass like opacity in the right upper lobe measuring up to 5.6 x 4.4 cm (image 24 of series 8), which has an enlarging 1.6 cm soft tissue attenuation component epicenter of the lesion (image 68 of series 4). There is some diffuse low level metabolic activity throughout this lesion, but at the center of the lesion corresponding to the solid nodule there is a focus of hypermetabolism (SUVmax = 2.6), and the imaging features are highly concerning for an evolving invasive adenocarcinoma. No hypermetabolic mediastinal or hilar nodes. Calcified granuloma in the right lower lobe. No other suspicious appearing pulmonary nodules or masses. Moderate to severe centrilobular emphysema, most pronounced throughout the upper lobes of the lungs bilaterally. No acute consolidative airspace disease. No pleural effusions. Esophagus is patulous. Heart size is borderline enlarged. Mitral annuloplasty. There is atherosclerosis of the thoracic aorta, the great vessels of the mediastinum and the coronary arteries, including calcified atherosclerotic plaque in the left anterior descending coronary artery.   ABDOMEN/PELVIS   No abnormal hypermetabolic activity within the liver, pancreas, adrenal glands, or spleen. No hypermetabolic lymph nodes in the abdomen or pelvis.  Status post cholecystectomy. As with prior examinations there is some mild diffuse haziness throughout the small bowel mesentery with numerous borderline enlarged and mildly enlarged mesenteric lymph nodes, which do not demonstrate significant hypermetabolism on the PET portion of the examination. These findings are nonspecific, but can be seen in the setting of prior mesenteric adenitis. Colonic diverticulosis, most severe in the region of the sigmoid colon, without surrounding inflammatory changes to suggest an acute diverticulitis at this time. No significant volume of ascites. No pneumoperitoneum. No pathologic distention of small bowel. Mild atherosclerosis throughout the abdominal and pelvic vasculature, without evidence of aneurysm.   SKELETON   No focal hypermetabolic activity to suggest skeletal metastasis.   IMPRESSION: 1. Progressively enlarging mixed solid and subsolid mass in the right upper lobe which currently measures up to 5.6 x 5.4 cm and has an enlarging 1.6 cm soft tissue attenuation nidus in the center which is hypermetabolic (SUVmax = 2.6), highly concerning for a developing invasive adenocarcinoma. 2. No associated mediastinal or hilar lymphadenopathy at this time. No definite signs of distal metastatic disease. 3. Moderate to severe centrilobular emphysema, most pronounced in the upper lobes of the lungs bilaterally. 4. Atherosclerosis, including left anterior descending coronary artery disease. Assessment for potential risk factor modification, dietary therapy or pharmacologic therapy may be warranted, if clinically indicated. 5. Status post mitral annuloplasty. 6. Mild colonic diverticulosis without findings to suggest acute diverticulitis at this time.  Electronically Signed   By: Vinnie Langton M.D.   On: 11/29/2013 11:33   Impression:  Some interval enlargement of the sub-solid groundglass opacity in the right upper lobe with some  enlargement of a small (1.6 cm) soft tissue mass within this lesion that does have increased metabolic activity on PET imaging with SUVmax 2.6.  This could represent a slow growing malignancy. Alternatively this could remained an inflammatory or infectious process such as BOOP or fungus.  The patient has underlying moderate to severe chronic obstructive pulmonary disease with significant emphysematous changes throughout the right upper lobe. Location, small size and radiographic appearance of the soft tissue mass or probably not favorable for transthoracic needle aspiration biopsy. Electromagnetic navigational biopsy could be considered, but a negative result would not rule out the possibility of malignancy.  Surgical biopsy would likely require lobectomy, and risks of surgery will be further increased because the patient has had previous right anterior thoracotomy for minimally invasive mitral valve repair.   Plan:  I've discussed options at length with the patient in the office today. We discussed redo thoracotomy for surgical biopsy with possible right upper lobectomy versus continued observation with serial CT and PET imaging versus attempts at biopsy including both transthoracic needle aspiration and electromagnetic navigational biopsy.  Because of the location and radiographic appearance of this mass I am skeptical that attempts at biopsy would be helpful.  Given the risks associated with surgery it would not be unreasonable to continue to observe this patient for a short period of time, but are remain concerned that this lesion could represent a slowly growing malignancy.  The patient wants to think matters over before making a decision. We will obtain followup pulmonary function tests and transthoracic echocardiogram. The patient will schedule an appointment to discuss this matter further with Dr. Joya Gaskins and he will return to our office for further followup in 3 weeks. All of his questions been  addressed.   I spent in excess of 30 minutes during the conduct of this office consultation and >50% of this time involved direct face-to-face encounter with the patient for counseling and/or coordination of their care.   Valentina Gu. Roxy Manns, MD 12/03/2013 3:54 PM

## 2013-12-04 ENCOUNTER — Telehealth: Payer: Self-pay | Admitting: Critical Care Medicine

## 2013-12-04 NOTE — Telephone Encounter (Signed)
Pt needs workin with me 12/11/13   Open AM slots starting 10AM

## 2013-12-04 NOTE — Telephone Encounter (Signed)
Called spoke with pt. He is scheduled to come in and see PW 12/11/13 at 10 AM. Aware this is [place] office. Nothing further needed

## 2013-12-04 NOTE — Telephone Encounter (Signed)
Per OV note with Dr. Roxy Manns 12/03/13: I've discussed options at length with the patient in the office today. We discussed redo thoracotomy for surgical biopsy with possible right upper lobectomy versus continued observation with serial CT and PET imaging versus attempts at biopsy including both transthoracic needle aspiration and electromagnetic navigational biopsy. Because of the location and radiographic appearance of this mass I am skeptical that attempts at biopsy would be helpful. Given the risks associated with surgery it would not be unreasonable to continue to observe this patient for a short period of time, but are remain concerned that this lesion could represent a slowly growing malignancy. The patient wants to think matters over before making a decision. We will obtain followup pulmonary function tests and transthoracic echocardiogram. The patient will schedule an appointment to discuss this matter further with Dr. Joya Gaskins ---  Pt is refusing to see anyone but PW. No openings at all. Please advise Dr. Joya Gaskins thanks

## 2013-12-04 NOTE — Telephone Encounter (Signed)
Spoke with pt. Aware we will call him to schedule an appt to see PW on 12/11/13 as soon as it opens up.

## 2013-12-08 DIAGNOSIS — W57XXXA Bitten or stung by nonvenomous insect and other nonvenomous arthropods, initial encounter: Secondary | ICD-10-CM | POA: Diagnosis not present

## 2013-12-08 DIAGNOSIS — T148 Other injury of unspecified body region: Secondary | ICD-10-CM | POA: Diagnosis not present

## 2013-12-11 ENCOUNTER — Encounter: Payer: Self-pay | Admitting: Critical Care Medicine

## 2013-12-11 ENCOUNTER — Other Ambulatory Visit (HOSPITAL_COMMUNITY): Payer: BC Managed Care – PPO

## 2013-12-11 ENCOUNTER — Encounter (HOSPITAL_COMMUNITY): Payer: BC Managed Care – PPO

## 2013-12-11 ENCOUNTER — Ambulatory Visit (INDEPENDENT_AMBULATORY_CARE_PROVIDER_SITE_OTHER): Payer: BC Managed Care – PPO | Admitting: Critical Care Medicine

## 2013-12-11 VITALS — BP 132/88 | HR 64 | Temp 97.2°F | Ht 69.0 in | Wt 166.6 lb

## 2013-12-11 DIAGNOSIS — J449 Chronic obstructive pulmonary disease, unspecified: Secondary | ICD-10-CM

## 2013-12-11 DIAGNOSIS — J438 Other emphysema: Secondary | ICD-10-CM | POA: Diagnosis not present

## 2013-12-11 DIAGNOSIS — R911 Solitary pulmonary nodule: Secondary | ICD-10-CM | POA: Diagnosis not present

## 2013-12-11 DIAGNOSIS — J439 Emphysema, unspecified: Secondary | ICD-10-CM

## 2013-12-11 DIAGNOSIS — J4489 Other specified chronic obstructive pulmonary disease: Secondary | ICD-10-CM

## 2013-12-11 NOTE — Patient Instructions (Signed)
Pulmonary function test will be ordered CT chest will be obtained in 3 months No medication changes Return 3 months

## 2013-12-11 NOTE — Progress Notes (Signed)
Subjective:    Patient ID: Chris Lewis, male    DOB: [birth year], 73 y.o.   MRN: [id number]  HPI  This is a 73 y.o.  man with COPD   12/11/2013 Chief Complaint  Patient presents with  . 3 month follow up    Pt states breathing is unchanged. Pt states he has increase congestion and cough with white and green mucous and has worsened since pollen season started. Pt c/o substernal CP with and without activity.  Pt with lung lesion ? CA. Pt with nodule on lung seen on incidental finding on CT. Pt notes post nasal drip, notes more congestion, on zyrtec.  Pollen issues.  Cough is prod green mucus on occasion. No hemoptysis, no chest pain. Pt denies any significant sore throat, nasal congestion or excess secretions, fever, chills, sweats, unintended weight loss, pleurtic or exertional chest pain, orthopnea PND, or leg swelling Pt denies any increase in rescue therapy over baseline, denies waking up needing it or having any early am or nocturnal exacerbations of coughing/wheezing/or dyspnea. Pt also denies any obvious fluctuation in symptoms with  weather or environmental change or other alleviating or aggravating factors      Review of Systems Constitutional:   No  weight loss, night sweats,  Fevers, chills, fatigue, lassitude. HEENT:   No headaches,  Difficulty swallowing,  Tooth/dental problems,  Sore throat,                No sneezing, itching, ear ache, nasal congestion, post nasal drip,   CV:  No chest pain,  Orthopnea, PND, swelling in lower extremities, anasarca, dizziness, palpitations  GI  No heartburn, indigestion, abdominal pain, nausea, vomiting, diarrhea, change in bowel habits, loss of appetite  Resp: Notes shortness of breath with exertion and  at rest.  Notes excess mucus, notes productive cough,  No non-productive cough,  No coughing up of blood.  Notes change in color of mucus.  No wheezing.  No chest wall deformity  Skin: no rash or lesions.  GU: no dysuria, change in  color of urine, no urgency or frequency.  No flank pain.  MS:  No joint pain or swelling.  No decreased range of motion.  No back pain.  Psych:  No change in mood or affect. No depression or anxiety.  No memory loss.     Objective:   Physical Exam   Filed Vitals:   12/11/13 0947  BP: 132/88  Pulse: 64  Temp: 97.2 F (36.2 C)  TempSrc: Oral  Height: 5\' 9"  (1.753 m)  Weight: 166 lb 9.6 oz (75.569 kg)  SpO2: 99%    Gen: Pleasant, well-nourished, in no distress,  normal affect  ENT: No lesions,  mouth clear,  oropharynx clear, no postnasal drip  Neck: No JVD, no TMG, no carotid bruits  Lungs: No use of accessory muscles, no dullness to percussion, distant bs,   Cardiovascular: RRR, heart sounds normal, no murmur or gallops, no peripheral edema  Abdomen: soft and NT, no HSM,  BS normal  Musculoskeletal: No deformities, no cyanosis or clubbing  Neuro: alert, non focal  Skin: Warm, no lesions or rashes  No results found.  CT Chest 11/2013: IMPRESSION:  1. Progressively enlarging mixed solid and subsolid mass in the  right upper lobe which currently measures up to 5.6 x 5.4 cm and has  an enlarging 1.6 cm soft tissue attenuation nidus in the center  which is hypermetabolic (SUVmax = 2.6), highly concerning for a  developing invasive  adenocarcinoma.  2. No associated mediastinal or hilar lymphadenopathy at this time.  No definite signs of distal metastatic disease.  3. Moderate to severe centrilobular emphysema, most pronounced in  the upper lobes of the lungs bilaterally.     Assessment & Plan:   Obstructive chronic bronchitis without exacerbation, due to chronic aspiration Gold C Copd gold C stable at present Plan No change in inhaled or maintenance medications.   Nodule of right lung RUL mix of solid, subsolid ground glass nodular scar.  Suspect inflammatory but cannot rule out CA.  Pt is a poor surgical candidate and lesion difficult to access by FOB/ENB.   Also would not needle this lesion due to surrounding emphysematous bleb Plan Repeat CT Chest 3 months, close followup Obtain full PFTs    Updated Medication List Outpatient Encounter Prescriptions as of 12/11/2013  Medication Sig  . amoxicillin (AMOXIL) 500 MG capsule Take 500 mg by mouth. Prior to dental procedures  . aspirin 81 MG tablet Take 81 mg by mouth daily.    . Biotin 1000 MCG tablet Take 1,000 mcg by mouth daily.    . calcium carbonate (OS-CAL) 1250 MG chewable tablet Chew 2 tablets by mouth daily.   . calcium carbonate (TUMS EX) 750 MG chewable tablet Chew 1 tablet by mouth as needed. For gas and reflux.  . cetirizine (ZYRTEC) 10 MG tablet Take 10 mg by mouth daily as needed.   . Cholecalciferol (VITAMIN D3) 1000 UNITS CAPS Take 1,000 Units by mouth daily.   . clonazePAM (KLONOPIN) 0.5 MG tablet Take 0.25 mg by mouth at bedtime.   Marland Kitchen doxycycline (VIBRA-TABS) 100 MG tablet Take 1 tablet by mouth daily.  . fluticasone (FLONASE) 50 MCG/ACT nasal spray Place 2 sprays into both nostrils daily.  . Lutein 6 MG CAPS Take 1 capsule by mouth daily.   . metroNIDAZOLE (METROGEL) 1 % gel Apply 1 application topically daily. Apply thin layer to affected area externally  . Multiple Vitamins-Minerals (ICAPS) CAPS Take 1 capsule by mouth 2 (two) times daily.  . ondansetron (ZOFRAN) 4 MG tablet Take 1 tablet by mouth daily as needed.  . ondansetron (ZOFRAN-ODT) 4 MG disintegrating tablet Take 4 mg by mouth every 8 (eight) hours as needed for nausea.  Vladimir Faster Glycol-Propyl Glycol (SYSTANE OP) Apply 1 drop to eye 2 (two) times daily.    Marland Kitchen tiotropium (SPIRIVA HANDIHALER) 18 MCG inhalation capsule Place 1 capsule (18 mcg total) into inhaler and inhale daily.  . traMADol (ULTRAM) 50 MG tablet Take 1 tablet by mouth Every 6 hours as needed.  . VENTOLIN HFA 108 (90 BASE) MCG/ACT inhaler INHALE 2 PUFFS INTO THE LUNGS EVERY 4 (FOUR) HOURS AS NEEDED FOR WHEEZING.

## 2013-12-12 NOTE — Assessment & Plan Note (Signed)
Copd gold C stable at present Plan No change in inhaled or maintenance medications.

## 2013-12-12 NOTE — Assessment & Plan Note (Signed)
RUL mix of solid, subsolid ground glass nodular scar.  Suspect inflammatory but cannot rule out CA.  Pt is a poor surgical candidate and lesion difficult to access by FOB/ENB.  Also would not needle this lesion due to surrounding emphysematous bleb Plan Repeat CT Chest 3 months, close followup Obtain full PFTs

## 2013-12-18 ENCOUNTER — Other Ambulatory Visit: Payer: Self-pay | Admitting: Critical Care Medicine

## 2013-12-18 ENCOUNTER — Ambulatory Visit (HOSPITAL_COMMUNITY)
Admission: RE | Admit: 2013-12-18 | Discharge: 2013-12-18 | Disposition: A | Discharge status: Home / Self Care | Payer: BC Managed Care – PPO | Source: Ambulatory Visit | Attending: Critical Care Medicine | Admitting: Critical Care Medicine

## 2013-12-18 DIAGNOSIS — Z87891 Personal history of nicotine dependence: Secondary | ICD-10-CM | POA: Diagnosis not present

## 2013-12-18 DIAGNOSIS — J439 Emphysema, unspecified: Secondary | ICD-10-CM

## 2013-12-18 DIAGNOSIS — J4489 Other specified chronic obstructive pulmonary disease: Secondary | ICD-10-CM | POA: Insufficient documentation

## 2013-12-18 DIAGNOSIS — J449 Chronic obstructive pulmonary disease, unspecified: Secondary | ICD-10-CM | POA: Diagnosis not present

## 2013-12-18 DIAGNOSIS — R942 Abnormal results of pulmonary function studies: Secondary | ICD-10-CM | POA: Diagnosis not present

## 2013-12-18 MED ORDER — ALBUTEROL SULFATE (2.5 MG/3ML) 0.083% IN NEBU
2.5000 mg | INHALATION_SOLUTION | Freq: Once | RESPIRATORY_TRACT | Status: AC
  Administered 2013-12-18: 2.5 mg via RESPIRATORY_TRACT

## 2013-12-18 NOTE — Telephone Encounter (Signed)
Spiriva rx was sent to [facility] [address] on 08/20/13 #30 x 11. Called [facility], spoke with Verdis Frederickson.  They did receive the Spiriva rx from Jan.  They will process the January spriva rx and delete one recently sent. Nothing further needed.

## 2013-12-19 DIAGNOSIS — L738 Other specified follicular disorders: Secondary | ICD-10-CM | POA: Diagnosis not present

## 2013-12-19 DIAGNOSIS — L678 Other hair color and hair shaft abnormalities: Secondary | ICD-10-CM | POA: Diagnosis not present

## 2013-12-19 NOTE — Progress Notes (Signed)
Quick Note:  Called, spoke with pt. Informed him of PFT results and recs per Dr. Wright. He verbalized understanding and voiced no further questions or concerns at this time. ______ 

## 2013-12-20 LAB — PULMONARY FUNCTION TEST
DL/VA % PRED: 59 %
DL/VA: 2.71 ml/min/mmHg/L
DLCO UNC % PRED: 47 %
DLCO unc: 14.75 ml/min/mmHg
FEF 25-75 POST: 0.65 L/s
FEF 25-75 Pre: 0.58 L/sec
FEF2575-%CHANGE-POST: 12 %
FEF2575-%Pred-Post: 30 %
FEF2575-%Pred-Pre: 26 %
FEV1-%Change-Post: 5 %
FEV1-%PRED-POST: 67 %
FEV1-%PRED-PRE: 64 %
FEV1-PRE: 1.9 L
FEV1-Post: 2 L
FEV1FVC-%Change-Post: 5 %
FEV1FVC-%Pred-Pre: 65 %
FEV6-%Change-Post: 1 %
FEV6-%Pred-Post: 91 %
FEV6-%Pred-Pre: 89 %
FEV6-POST: 3.51 L
FEV6-Pre: 3.45 L
FEV6FVC-%CHANGE-POST: 2 %
FEV6FVC-%Pred-Post: 94 %
FEV6FVC-%Pred-Pre: 92 %
FVC-%CHANGE-POST: 0 %
FVC-%PRED-PRE: 97 %
FVC-%Pred-Post: 96 %
FVC-Post: 3.94 L
FVC-Pre: 3.97 L
PRE FEV1/FVC RATIO: 48 %
Post FEV1/FVC ratio: 51 %
Post FEV6/FVC ratio: 89 %
Pre FEV6/FVC Ratio: 87 %
RV % pred: 49 %
RV: 1.23 L
TLC % pred: 78 %
TLC: 5.37 L

## 2013-12-24 ENCOUNTER — Ambulatory Visit: Payer: BC Managed Care – PPO | Admitting: Thoracic Surgery (Cardiothoracic Vascular Surgery)

## 2014-01-23 ENCOUNTER — Ambulatory Visit (INDEPENDENT_AMBULATORY_CARE_PROVIDER_SITE_OTHER)
Admission: RE | Admit: 2014-01-23 | Discharge: 2014-01-23 | Disposition: A | Discharge status: Home / Self Care | Payer: BC Managed Care – PPO | Source: Ambulatory Visit | Attending: Adult Health | Admitting: Adult Health

## 2014-01-23 ENCOUNTER — Encounter: Payer: Self-pay | Admitting: Adult Health

## 2014-01-23 ENCOUNTER — Ambulatory Visit (INDEPENDENT_AMBULATORY_CARE_PROVIDER_SITE_OTHER): Payer: BC Managed Care – PPO | Admitting: Adult Health

## 2014-01-23 ENCOUNTER — Telehealth: Payer: Self-pay | Admitting: Adult Health

## 2014-01-23 ENCOUNTER — Encounter (INDEPENDENT_AMBULATORY_CARE_PROVIDER_SITE_OTHER): Payer: Self-pay

## 2014-01-23 VITALS — BP 116/64 | HR 88 | Temp 98.6°F | Ht 69.0 in | Wt 165.6 lb

## 2014-01-23 DIAGNOSIS — J449 Chronic obstructive pulmonary disease, unspecified: Secondary | ICD-10-CM

## 2014-01-23 MED ORDER — LEVOFLOXACIN 500 MG PO TABS: 500.0000 mg | ORAL_TABLET | Freq: Every day | ORAL | Status: DC

## 2014-01-23 NOTE — Progress Notes (Signed)
Quick Note:  LMOM TCB x1. ______ 

## 2014-01-23 NOTE — Assessment & Plan Note (Signed)
Flare  Check cxr  Keep follow up for serial CT next month as planned   Plan  Levaquin 500mg  daily for 7 days  Mucinex DM Twice daily  As needed  Cough/congesiton  Continue on Flutter valve.  Fluids and rest  Eat yogurt daily and Probiotic while on Antibiotics.  Please contact office for sooner follow up if symptoms do not improve or worsen or seek emergency care  Follow up Dr. Joya Gaskins  In 4 weeks and As needed

## 2014-01-23 NOTE — Addendum Note (Signed)
Addended by: Virl Cagey on: 01/23/2014 11:09 AM   Modules accepted: Orders

## 2014-01-23 NOTE — Patient Instructions (Signed)
Levaquin 500mg  daily for 7 days  Mucinex DM Twice daily  As needed  Cough/congesiton  Continue on Flutter valve.  Fluids and rest  Eat yogurt daily and Probiotic while on Antibiotics.  Please contact office for sooner follow up if symptoms do not improve or worsen or seek emergency care  Follow up Dr. Joya Gaskins  In 4 weeks and As needed

## 2014-01-23 NOTE — Telephone Encounter (Signed)
Spoke with pt and advised him of cxr result and rec's per TP. Pt verbalized understanding and nothing further needed

## 2014-01-23 NOTE — Progress Notes (Signed)
   Subjective:    Patient ID: Chris Lewis, male    DOB: 1941-07-10, 72 y.o.   MRN: [id number]  HPI 73 yo with known hx of COPD  Hx of recurrent bronchitis with chronic aspiration and history of achalasia. Pt has known solitary lung lesion noted on CT scan this past October 2014  Repeat CT scan performed 09/07/2013 revealed persistent area of opacity in the right upper lobe felt most likely to be consistent with scarring from his previous episode of pneumonia, although a small somewhat spiculated appearance within the opacity appeared solid and had not completely resolved. PET showed Some interval enlargement of the sub-solid groundglass opacity in the right upper lobe with some enlargement of a small (1.6 cm) soft tissue mass within this lesion that does have increased metabolic activity on PET imaging with SUVmax 2.6.  Concerning for possible slow growing malignancy vs possible BOOP or fungus  Not a surgical candidate at this time d/t lung function  Plans for serial CT follow up   01/23/2014 Acute OV  Complains of right lung discomfort w/ inhalation and left low back pain, increased SOB, prod cough with yellow/tan/spotty bloody mucus x3 days.   Denies f/c/s, nausea, vomiting, orthopnea or edema.  Using flutter valve and mucinex.   On Doxycycline x30days for tick bite per PCP. Now finished     Review of Systems Constitutional:   No  weight loss, night sweats,  Fevers, chills, fatigue, or  lassitude.  HEENT:   No headaches,  Difficulty swallowing,  Tooth/dental problems, or  Sore throat,                No sneezing, itching, ear ache, nasal congestion, post nasal drip,   CV:  No chest pain,  Orthopnea, PND, swelling in lower extremities, anasarca, dizziness, palpitations, syncope.   GI  No heartburn, indigestion, abdominal pain, nausea, vomiting, diarrhea, change in bowel habits, loss of appetite, bloody stools.   Resp: No shortness of breath with exertion or at rest.  No excess mucus,  no productive cough,  No non-productive cough,  No coughing up of blood.  No change in color of mucus.  No wheezing.  No chest wall deformity  Skin: no rash or lesions.  GU: no dysuria, change in color of urine, no urgency or frequency.  No flank pain, no hematuria   MS:  No joint pain or swelling.  No decreased range of motion.  No back pain.  Psych:  No change in mood or affect. No depression or anxiety.  No memory loss.         Objective:   Physical Exam GEN: A/Ox3; pleasant , NAD, well nourished   HEENT:  /AT,  EACs-clear, TMs-wnl, NOSE-clear, THROAT-clear, no lesions, no postnasal drip or exudate noted.   NECK:  Supple w/ fair ROM; no JVD; normal carotid impulses w/o bruits; no thyromegaly or nodules palpated; no lymphadenopathy.  RESP  Clear  P & A; w/o, wheezes/ rales/ or rhonchi.no accessory muscle use, no dullness to percussion  CARD:  RRR, no m/r/g  , no peripheral edema, pulses intact, no cyanosis or clubbing.  GI:   Soft & nt; nml bowel sounds; no organomegaly or masses detected.  Musco: Warm bil, no deformities or joint swelling noted.   Neuro: alert, no focal deficits noted.    Skin: Warm, no lesions or rashes         Assessment & Plan:

## 2014-01-31 ENCOUNTER — Telehealth: Payer: Self-pay | Admitting: Critical Care Medicine

## 2014-01-31 NOTE — Telephone Encounter (Signed)
Per OV w/ TP 01/23/14; Patient Instructions      Levaquin 500mg  daily for 7 days   Mucinex DM Twice daily  As needed  Cough/congesiton   Continue on Flutter valve.   Fluids and rest   Eat yogurt daily and Probiotic while on Antibiotics.   Please contact office for sooner follow up if symptoms do not improve or worsen or seek emergency care  Follow up Dr. Joya Gaskins  In 4 weeks and As needed  ---  [facility] x1` for pt

## 2014-01-31 NOTE — Telephone Encounter (Signed)
Called spoke with pt. appt scheduled to see [facility] tomorrow at 11:!5. Pt aware to arive at 11 for CXR prior. Nothing further needed

## 2014-01-31 NOTE — Telephone Encounter (Signed)
Needs OV and CXR 

## 2014-01-31 NOTE — Telephone Encounter (Signed)
Pt finished Levaquin 2 days ago.  Pt feels some better but still c/o nasal congestion, prod cough (yellow, white), some pain in center chest.  Denies fever.  Still taking Mucinex prn and using flutter valve.  Pt concerned because he had similar symptoms that lasted 5 weeks last year.  Please advise.

## 2014-01-31 NOTE — Telephone Encounter (Signed)
Pt returning call.Chris Lewis ° °

## 2014-02-01 ENCOUNTER — Ambulatory Visit (INDEPENDENT_AMBULATORY_CARE_PROVIDER_SITE_OTHER)
Admission: RE | Admit: 2014-02-01 | Discharge: 2014-02-01 | Disposition: A | Discharge status: Home / Self Care | Payer: BC Managed Care – PPO | Source: Ambulatory Visit | Attending: Critical Care Medicine | Admitting: Critical Care Medicine

## 2014-02-01 ENCOUNTER — Ambulatory Visit (INDEPENDENT_AMBULATORY_CARE_PROVIDER_SITE_OTHER): Payer: BC Managed Care – PPO | Admitting: Internal Medicine

## 2014-02-01 ENCOUNTER — Encounter: Payer: Self-pay | Admitting: Internal Medicine

## 2014-02-01 ENCOUNTER — Ambulatory Visit: Payer: BC Managed Care – PPO | Admitting: Pulmonary Disease

## 2014-02-01 VITALS — BP 116/74 | HR 57 | Temp 97.8°F | Ht 69.0 in | Wt 163.4 lb

## 2014-02-01 DIAGNOSIS — R05 Cough: Secondary | ICD-10-CM | POA: Diagnosis not present

## 2014-02-01 DIAGNOSIS — J449 Chronic obstructive pulmonary disease, unspecified: Secondary | ICD-10-CM | POA: Diagnosis not present

## 2014-02-01 DIAGNOSIS — R059 Cough, unspecified: Secondary | ICD-10-CM

## 2014-02-01 MED ORDER — OMEPRAZOLE 20 MG PO CPDR
20.0000 mg | DELAYED_RELEASE_CAPSULE | Freq: Two times a day (BID) | ORAL | Status: DC
Start: 2014-02-01 — End: 2014-05-31

## 2014-02-01 NOTE — Progress Notes (Signed)
Quick Note:  Pt aware of cxr results - was seen for OV by Dr. Melvyn Novas on 02/01/14. See OV note for further information. ______

## 2014-02-01 NOTE — Patient Instructions (Addendum)
For cough mucinex dm up to 1200 mg in am and pm as needed  If still congestion, chest discomfort/ cough  > tramadol 50 mg every 4 hours  Omeprazole 20 mg Take 30- 60 min before your first and last meals of the day   GERD (REFLUX)  is an extremely common cause of respiratory symptoms, many times with no significant heartburn at all.    It can be treated with medication, but also with lifestyle changes including avoidance of late meals, excessive alcohol, smoking cessation, and avoid fatty foods, chocolate, peppermint, colas, red wine, and acidic juices such as orange juice.  NO MINT OR MENTHOL PRODUCTS SO NO COUGH DROPS  USE SUGARLESS CANDY INSTEAD (jolley ranchers or Stover's)  NO OIL BASED VITAMINS - use powdered substitutes.    Keep follow up with Dr Joya Gaskins

## 2014-02-01 NOTE — Progress Notes (Signed)
Quick Note:  Notify the patient that the Xray is stable and no pneumonia No change in medications are recommended. Continue current meds as prescribed at last office visit ______ 

## 2014-02-01 NOTE — Assessment & Plan Note (Addendum)
-   12/18/13 PFTs  FEV1  1.90 (64%) ratio 48   GOLD II criteria with persistent non-specific symptoms and freq flares  DDX of  difficult airways management all start with A and  include Adherence, Ace Inhibitors, Acid Reflux, Active Sinus Disease, Alpha 1 Antitripsin deficiency, Anxiety masquerading as Airways dz,  ABPA,  allergy(esp in young), Aspiration (esp in elderly), Adverse effects of DPI,  Active smokers, plus two Bs  = Bronchiectasis and Beta blocker use..and one C= CHF  Adherence is always the initial "prime suspect" and is a multilayered concern that requires a "trust but verify" approach in every patient - starting with knowing how to use medications, especially inhalers, correctly, keeping up with refills and understanding the fundamental difference between maintenance and prns vs those medications only taken for a very short course and then stopped and not refilled.  - spiriva should be first thing each am - consider change to symbicort 160 2bid if continues to have aecopd - The proper method of use, as well as anticipated side effects, of a metered-dose inhaler are discussed and demonstrated to the patient. Improved effectiveness after extensive coaching during this visit to a level of approximately  75% from baseline of < 50%   ? Acid (or non-acid) GERD > always difficult to exclude as up to 75% of pts in some series report no assoc GI/ Heartburn symptoms> rec max (24h)  acid suppression and diet restrictions/ reviewed and instructions given in writing.   ? Anxiety > dx of exclusion.

## 2014-02-01 NOTE — Progress Notes (Signed)
Subjective:    Patient ID: Chris Lewis, male    DOB: [birth year], 73 y.o.   MRN: [id number]  HPI 73 yo with known hx of COPD  Hx of recurrent bronchitis with chronic aspiration and history of achalasia. Pt has known solitary lung lesion noted on CT scan this past October 2014  Repeat CT scan performed 09/07/2013 revealed persistent area of opacity in the right upper lobe felt most likely to be consistent with scarring from his previous episode of pneumonia, although a small somewhat spiculated appearance within the opacity appeared solid and had not completely resolved. PET showed Some interval enlargement of the sub-solid groundglass opacity in the right upper lobe with some enlargement of a small (1.6 cm) soft tissue mass within this lesion that does have increased metabolic activity on PET imaging with SUVmax 2.6.  Concerning for possible slow growing malignancy vs possible BOOP or fungus  Not a surgical candidate at this time d/t lung function  Plans for serial CT follow up   01/23/2014 Acute OV  Complains of right lung discomfort w/ inhalation and left low back pain, increased SOB, prod cough with yellow/tan/spotty bloody mucus x3 days.   Denies f/c/s, nausea, vomiting, orthopnea or edema.  Using flutter valve and mucinex.   On Doxycycline x30days for tick bite per PCP. Now finished  rec Levaquin 500mg  daily for 7 days  Mucinex DM Twice daily  As needed  Cough/congesiton  Continue on Flutter valve.  Fluids and rest  Eat yogurt daily and Probiotic while on Antibiotics.    02/01/2014 acute ov/Wert re: copd/ spiriva at noon and ventolin 3-4 x per d when sick  Chief Complaint  Patient presents with  . Acute Visit    PW pt.  Had cxr this morning.  Was seen by TP on 6/10.  Finished levaquin.  Still has some pain in center of chest and prod cough with yellow mucus.      90% improved vs baseline p above rx but still feeling "congested" Not limited by breathing from desired activities     No incrase need for saba  No obvious day to day or daytime variabilty or assoc chronic cough or cp or chest tightness, subjective wheeze overt sinus or hb symptoms. No unusual exp hx or h/o childhood pna/ asthma or knowledge of premature birth.  Sleeping ok without nocturnal  or early am exacerbation  of respiratory  c/o's or need for noct saba. Also denies any obvious fluctuation of symptoms with weather or environmental changes or other aggravating or alleviating factors except as outlined above   Current Medications, Allergies, Complete Past Medical History, Past Surgical History, Family History, and Social History were reviewed in [employer] record.  ROS  The following are not active complaints unless bolded sore throat, dysphagia, dental problems, itching, sneezing,  nasal congestion or excess/ purulent secretions, ear ache,   fever, chills, sweats, unintended wt loss, pleuritic or exertional cp, hemoptysis,  orthopnea pnd or leg swelling, presyncope, palpitations, heartburn, abdominal pain, anorexia, nausea, vomiting, diarrhea  or change in bowel or urinary habits, change in stools or urine, dysuria,hematuria,  rash, arthralgias, visual complaints, headache, numbness weakness or ataxia or problems with walking or coordination,  change in mood/affect or memory.                   Objective:   Physical Exam  GEN: A/Ox3; pleasant , NAD, well nourished rambling list of non-specific complaints   Wt Readings from  Last 3 Encounters:  02/01/14 163 lb 6.4 oz (74.118 kg)  01/23/14 165 lb 9.6 oz (75.116 kg)  12/11/13 166 lb 9.6 oz (75.569 kg)      HEENT:  [place]/AT,  EACs-clear, TMs-wnl, NOSE-clear, THROAT-clear, no lesions, no postnasal drip or exudate noted.   NECK:  Supple w/ fair ROM; no JVD; normal carotid impulses w/o bruits; no thyromegaly or nodules palpated; no lymphadenopathy.  RESP  Clear  P & A; w/o, wheezes/ rales/ or rhonchi.no accessory muscle use,  no dullness to percussion  CARD:  RRR, no m/r/g  , no peripheral edema, pulses intact, no cyanosis or clubbing.  GI:   Soft & nt; nml bowel sounds; no organomegaly or masses detected.  Musco: Warm bil, no deformities or joint swelling noted.   Neuro: alert, no focal deficits noted.    Skin: Warm, no lesions or rashes   cxr 01/23/14 Copd/e  No active dz      Assessment & Plan:

## 2014-02-05 ENCOUNTER — Telehealth: Payer: Self-pay | Admitting: *Deleted

## 2014-02-05 NOTE — Telephone Encounter (Signed)
Message copied by Rosana Berger on Tue Feb 05, 2014 11:13 AM ------      Message from: Tanda Rockers      Created: Fri Feb 01, 2014  8:37 PM       Forgot to advise that spirva should be dosed first thing each am, not at noon  ------

## 2014-02-05 NOTE — Telephone Encounter (Signed)
Pt aware. Nothing further needed 

## 2014-02-05 NOTE — Telephone Encounter (Signed)
LMTCB

## 2014-02-08 ENCOUNTER — Encounter: Payer: Self-pay | Admitting: General Surgery

## 2014-03-04 ENCOUNTER — Telehealth: Payer: Self-pay | Admitting: Critical Care Medicine

## 2014-03-04 NOTE — Telephone Encounter (Signed)
Pt concerned with CT chest coming up 03/12/14 Pt states that in the past with CT scans he has been instructed to take Prednisone 50mg  and Benadryl 50mg  the night prior to the test to keep patient from having allergic reaction to IV contrast  Allergies  Allergen Reactions  . Contrast Media [Iodinated Diagnostic Agents] Rash    Heavy rash and itching 20 yrs ago. Needs full premeds and does well with this.  . Atorvastatin     REACTION: muscle pain REACTION: muscle pain  . Propoxyphene N-Acetaminophen Itching    rash  . Rosuvastatin     REACTION: muscle weakness  . Hydrocodone Rash    rash rash  . Metrizamide Rash    Heavy rash and itching 20 yrs ago. Needs full premeds and does well with this.   Please advise Dr Joya Gaskins if okay to send this his pharmacy [facility] in [place].

## 2014-03-04 NOTE — Telephone Encounter (Signed)
He DOES NOT NEED CONTRAST ,  This should be A NON CONTRAST SCAN

## 2014-03-05 NOTE — Telephone Encounter (Signed)
Pt is aware of below. Nothing further needed 

## 2014-03-08 ENCOUNTER — Ambulatory Visit: Payer: BC Managed Care – PPO | Admitting: Cardiology

## 2014-03-12 ENCOUNTER — Ambulatory Visit (INDEPENDENT_AMBULATORY_CARE_PROVIDER_SITE_OTHER)
Admission: RE | Admit: 2014-03-12 | Discharge: 2014-03-12 | Disposition: A | Discharge status: Home / Self Care | Payer: BC Managed Care – PPO | Source: Ambulatory Visit | Attending: Critical Care Medicine | Admitting: Critical Care Medicine

## 2014-03-12 DIAGNOSIS — R911 Solitary pulmonary nodule: Secondary | ICD-10-CM

## 2014-03-13 ENCOUNTER — Ambulatory Visit (INDEPENDENT_AMBULATORY_CARE_PROVIDER_SITE_OTHER): Payer: BC Managed Care – PPO | Admitting: Cardiology

## 2014-03-13 ENCOUNTER — Encounter: Payer: Self-pay | Admitting: Cardiology

## 2014-03-13 VITALS — BP 110/75 | HR 79 | Ht 69.0 in | Wt 166.2 lb

## 2014-03-13 DIAGNOSIS — I059 Rheumatic mitral valve disease, unspecified: Secondary | ICD-10-CM | POA: Diagnosis not present

## 2014-03-13 DIAGNOSIS — Z9889 Other specified postprocedural states: Secondary | ICD-10-CM | POA: Diagnosis not present

## 2014-03-13 DIAGNOSIS — I10 Essential (primary) hypertension: Secondary | ICD-10-CM

## 2014-03-13 DIAGNOSIS — I341 Nonrheumatic mitral (valve) prolapse: Secondary | ICD-10-CM

## 2014-03-13 DIAGNOSIS — I340 Nonrheumatic mitral (valve) insufficiency: Secondary | ICD-10-CM

## 2014-03-13 NOTE — Patient Instructions (Signed)
Your physician recommends that you continue on your current medications as directed. Please refer to the Current Medication list given to you today.  Your physician wants you to follow-up in: 1 year with Dr. Turner. You will receive a reminder letter in the mail two months in advance. If you don't receive a letter, please call our office to schedule the follow-up appointment.  

## 2014-03-13 NOTE — Progress Notes (Signed)
[address] [place], [place]  [address] Phone: [phone] Fax:  [phone]  Date:  03/13/2014   ID:  Chris Lewis, DOB Oct 23, 1940, MRN [id number]  PCP:  Irven Shelling, MD  Cardiologist:  Fransico Him, MD     History of Present Illness: Chris Lewis is a 73 y.o. 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  He is doing well.  He denies any chest pain,  LE edema, dizziness, palpitations.  He has some mild DOE but exercises without any problems.  He is very upset and stressed to get news this am that he has a lung mass.  This is being worked up by Dr. Joya Gaskins.   Wt Readings from Last 3 Encounters:  02/01/14 163 lb 6.4 oz (74.118 kg)  01/23/14 165 lb 9.6 oz (75.116 kg)  12/11/13 166 lb 9.6 oz (75.569 kg)     Past Medical History  Diagnosis Date  . Achalasia   . Hypercholesterolemia   . Seasonal allergic rhinitis   . Transient ischemic attack   . Mitral valve regurgitation   . Osteopenia   . FH: cholecystectomy   . S/P right inguinal herniorrhaphy   . MVP (mitral valve prolapse)   . MR (mitral regurgitation)   . GERD (gastroesophageal reflux disease)   . Shortness of breath     sometimes at rest  . Stroke 10/2002  . Arthritis   . Anxiety     causes shortness  . Hiatal hernia   . Depression   . Chronic obstructive pulmonary disease   . COPD (chronic obstructive pulmonary disease)   . Pneumonia 06/04/2013  . Lesion of right lung 06/04/2013    Current Outpatient Prescriptions  Medication Sig Dispense Refill  . amoxicillin (AMOXIL) 500 MG capsule Take 500 mg by mouth. Prior to dental procedures      . aspirin 81 MG tablet Take 81 mg by mouth daily.        . Biotin 1000 MCG tablet Take 1,000 mcg by mouth daily.        . calcium carbonate (OS-CAL) 1250 MG chewable tablet Chew 2 tablets by mouth daily.       . calcium carbonate (TUMS EX) 750 MG chewable tablet Chew 1 tablet by mouth  as needed. For gas and reflux.      . cetirizine (ZYRTEC) 10 MG tablet Take 10 mg by mouth daily as needed.       . Cholecalciferol (VITAMIN D3) 1000 UNITS CAPS Take 1,000 Units by mouth daily.       . clonazePAM (KLONOPIN) 0.5 MG tablet Take 0.25 mg by mouth at bedtime.       . fluticasone (FLONASE) 50 MCG/ACT nasal spray Place 2 sprays into both nostrils daily.  16 g  6  . Lutein 6 MG CAPS Take 1 capsule by mouth daily.       . metroNIDAZOLE (METROGEL) 1 % gel Apply 1 application topically daily. Apply thin layer to affected area externally      . Multiple Vitamins-Minerals (ICAPS) CAPS Take 1 capsule by mouth 2 (two) times daily.      Marland Kitchen omeprazole (PRILOSEC) 20 MG capsule Take 1 capsule (20 mg total) by mouth 2 (two) times daily before a meal.  60 capsule  2  . ondansetron (ZOFRAN) 4 MG tablet Take 1 tablet by mouth daily as needed.      Marland Kitchen  ondansetron (ZOFRAN-ODT) 4 MG disintegrating tablet Take 4 mg by mouth every 8 (eight) hours as needed for nausea.      Vladimir Faster Glycol-Propyl Glycol (SYSTANE OP) Apply 1 drop to eye 2 (two) times daily.        Marland Kitchen tiotropium (SPIRIVA HANDIHALER) 18 MCG inhalation capsule Place 1 capsule (18 mcg total) into inhaler and inhale daily.  30 capsule  11  . traMADol (ULTRAM) 50 MG tablet Take 1 tablet by mouth Every 6 hours as needed.      . VENTOLIN HFA 108 (90 BASE) MCG/ACT inhaler INHALE 2 PUFFS INTO THE LUNGS EVERY 4 (FOUR) HOURS AS NEEDED FOR WHEEZING.  18 each  6   No current facility-administered medications for this visit.    Allergies:    Allergies  Allergen Reactions  . Contrast Media [Iodinated Diagnostic Agents] Rash    Heavy rash and itching 20 yrs ago. Needs full premeds and does well with this.  . Atorvastatin     REACTION: muscle pain REACTION: muscle pain  . Propoxyphene N-Acetaminophen Itching    rash  . Rosuvastatin     REACTION: muscle weakness  . Hydrocodone Rash    rash rash  . Metrizamide Rash    Heavy rash and itching 20 yrs  ago. Needs full premeds and does well with this.    Social History:  The patient  reports that he quit smoking about 16 years ago. His smoking use included Cigarettes. He has a 57 pack-year smoking history. He has never used smokeless tobacco. He reports that he does not drink alcohol or use illicit drugs.   Family History:  The patient's family history includes Cancer in his sister; Heart disease in his sister; Lung cancer in his sister; Scleroderma (age of onset: 46) in his mother; Stroke (age of onset: 19) in his father. There is no history of Anesthesia problems.   ROS:  Please see the history of present illness.      All other systems reviewed and negative.   PHYSICAL EXAM: VS:  Ht 5\' 9"  (1.753 m) Well nourished, well developed, in no acute distress HEENT: normal Neck: no JVD Cardiac:  normal S1, S2; RRR; no murmur Lungs:  clear to auscultation bilaterally, no wheezing, rhonchi or rales Abd: soft, nontender, no hepatomegaly Ext: no edema Skin: warm and dry Neuro:  CNs 2-12 intact, no focal abnormalities noted  EKG:  NSR with RBBB     ASSESSMENT AND PLAN:  1. HTN - well controlled  2. MVP with severe MR s/p MV repair - continue ASA 3.  RBBB  Followup with me in 1 year  Signed, Fransico Him, MD 03/13/2014 2:30 PM

## 2014-03-18 ENCOUNTER — Encounter: Payer: Self-pay | Admitting: Critical Care Medicine

## 2014-03-18 ENCOUNTER — Ambulatory Visit (INDEPENDENT_AMBULATORY_CARE_PROVIDER_SITE_OTHER): Payer: BC Managed Care – PPO | Admitting: Critical Care Medicine

## 2014-03-18 VITALS — BP 110/72 | HR 70 | Temp 97.1°F | Ht 69.0 in | Wt 165.2 lb

## 2014-03-18 DIAGNOSIS — R911 Solitary pulmonary nodule: Secondary | ICD-10-CM

## 2014-03-18 MED ORDER — CLONAZEPAM 1 MG PO TABS: ORAL_TABLET | ORAL | Status: DC

## 2014-03-18 NOTE — Assessment & Plan Note (Signed)
Right upper lobe lung nodule increasing in size compared to prior scans dating back to 2012 Malignant range PET uptake Plan Will pursue bronchoscopy with electronic navigational approach for biopsy once results of this are available further recommendations will follow

## 2014-03-18 NOTE — Progress Notes (Deleted)
Subjective:    Patient ID: Chris Lewis, male    DOB: 05-Aug-1941, 73 y.o.   MRN: [id number]  HPI   Subjective:    Patient ID: Chris Lewis, male    DOB: [birth year], 73 y.o.   MRN: [id number]  HPI 73 yo with known hx of COPD  Hx of recurrent bronchitis with chronic aspiration and history of achalasia. Pt has known solitary lung lesion noted on CT scan this past October 2014  Repeat CT scan performed 09/07/2013 revealed persistent area of opacity in the right upper lobe felt most likely to be consistent with scarring from his previous episode of pneumonia, although a small somewhat spiculated appearance within the opacity appeared solid and had not completely resolved. PET showed Some interval enlargement of the sub-solid groundglass opacity in the right upper lobe with some enlargement of a small (1.6 cm) soft tissue mass within this lesion that does have increased metabolic activity on PET imaging with SUVmax 2.6.  Concerning for possible slow growing malignancy vs possible BOOP or fungus  Not a surgical candidate at this time d/t lung function  Plans for serial CT follow up   01/23/2014 Acute OV  Complains of right lung discomfort w/ inhalation and left low back pain, increased SOB, prod cough with yellow/tan/spotty bloody mucus x3 days.   Denies f/c/s, nausea, vomiting, orthopnea or edema.  Using flutter valve and mucinex.   On Doxycycline x30days for tick bite per PCP. Now finished  rec Levaquin 500mg  daily for 7 days  Mucinex DM Twice daily  As needed  Cough/congesiton  Continue on Flutter valve.  Fluids and rest  Eat yogurt daily and Probiotic while on Antibiotics.    03/18/2014 Chief Complaint  Patient presents with  . Follow-up    DOE is unchanged, chest tightness on right upper chest, and prod cough with creamy to yellow mucus.   Dyspnea is the same. Here to discuss RUL lesion Pt denies any significant sore throat, nasal congestion or excess secretions, fever, chills,  sweats, unintended weight loss, pleurtic or exertional chest pain, orthopnea PND, or leg swelling Pt denies any increase in rescue therapy over baseline, denies waking up needing it or having any early am or nocturnal exacerbations of coughing/wheezing/or dyspnea. Pt also denies any obvious fluctuation in symptoms with  weather or environmental change or other alleviating or aggravating factors   Objective:   Physical Exam  GEN: A/Ox3; pleasant , NAD, well nourished rambling list of non-specific complaints   Wt Readings from Last 3 Encounters:  03/18/14 165 lb 3.2 oz (74.934 kg)  03/13/14 166 lb 3.2 oz (75.388 kg)  02/01/14 163 lb 6.4 oz (74.118 kg)      HEENT:  [place]/AT,  EACs-clear, TMs-wnl, NOSE-clear, THROAT-clear, no lesions, no postnasal drip or exudate noted.   NECK:  Supple w/ fair ROM; no JVD; normal carotid impulses w/o bruits; no thyromegaly or nodules palpated; no lymphadenopathy.  RESP  Clear  P & A; w/o, wheezes/ rales/ or rhonchi.no accessory muscle use, no dullness to percussion  CARD:  RRR, no m/r/g  , no peripheral edema, pulses intact, no cyanosis or clubbing.  GI:   Soft & nt; nml bowel sounds; no organomegaly or masses detected.  Musco: Warm bil, no deformities or joint swelling noted.   Neuro: alert, no focal deficits noted.    Skin: Warm, no lesions or rashes   cxr 01/23/14 Copd/e  No active dz      Assessment & Plan:  Review of Systems     Objective:   Physical Exam        Assessment & Plan:

## 2014-03-18 NOTE — Progress Notes (Signed)
Subjective:    Patient ID: Chris Lewis, male    DOB: [birth year], 73 y.o.   MRN: [id number]  HPI  03/18/2014 Chief Complaint  Patient presents with  . Follow-up    DOE is unchanged, chest tightness on right upper chest, and prod cough with creamy to yellow mucus.   Dyspnea is the same. Here to discuss RUL lesion Pt denies any significant sore throat, nasal congestion or excess secretions, fever, chills, sweats, unintended weight loss, pleurtic or exertional chest pain, orthopnea PND, or leg swelling Pt denies any increase in rescue therapy over baseline, denies waking up needing it or having any early am or nocturnal exacerbations of coughing/wheezing/or dyspnea. Pt also denies any obvious fluctuation in symptoms with  weather or environmental change or other alleviating or aggravating factors   Review of Systems Constitutional:   No  weight loss, night sweats,  Fevers, chills, fatigue, lassitude. HEENT:   No headaches,  Difficulty swallowing,  Tooth/dental problems,  Sore throat,                No sneezing, itching, ear ache, nasal congestion, post nasal drip,   CV:  No chest pain,  Orthopnea, PND, swelling in lower extremities, anasarca, dizziness, palpitations  GI  No heartburn, indigestion, abdominal pain, nausea, vomiting, diarrhea, change in bowel habits, loss of appetite  Resp: No shortness of breath with exertion or at rest.  No excess mucus, no productive cough,  No non-productive cough,  No coughing up of blood.  No change in color of mucus.  No wheezing.  No chest wall deformity  Skin: no rash or lesions.  GU: no dysuria, change in color of urine, no urgency or frequency.  No flank pain.  MS:  No joint pain or swelling.  No decreased range of motion.  No back pain.  Psych:  No change in mood or affect. No depression or anxiety.  No memory loss.     Objective:   Physical Exam Filed Vitals:   03/18/14 1035  BP: 110/72  Pulse: 70  Temp: 97.1 F (36.2 C)    TempSrc: Oral  Height: 5\' 9"  (1.753 m)  Weight: 165 lb 3.2 oz (74.934 kg)  SpO2: 95%    Gen: Pleasant, well-nourished, in no distress,  normal affect  ENT: No lesions,  mouth clear,  oropharynx clear, no postnasal drip  Neck: No JVD, no TMG, no carotid bruits  Lungs: No use of accessory muscles, no dullness to percussion, distant breath sounds  Cardiovascular: RRR, heart sounds normal, no murmur or gallops, no peripheral edema  Abdomen: soft and NT, no HSM,  BS normal  Musculoskeletal: No deformities, no cyanosis or clubbing  Neuro: alert, non focal  Skin: Warm, no lesions or rashes  No results found.        Assessment & Plan:   Incidental lung nodule, > 87mm and < 13mm Right upper lobe lung nodule increasing in size compared to prior scans dating back to 2012 Malignant range PET uptake Plan Will pursue bronchoscopy with electronic navigational approach for biopsy once results of this are available further recommendations will follow   Updated Medication List Outpatient Encounter Prescriptions as of 03/18/2014  Medication Sig  . amoxicillin (AMOXIL) 500 MG capsule Take 500 mg by mouth. Prior to dental procedures  . aspirin 81 MG tablet Take 81 mg by mouth daily.    . Biotin 1000 MCG tablet Take 1,000 mcg by mouth daily.    . calcium carbonate (OS-CAL) 1250  MG chewable tablet Chew 2 tablets by mouth daily.   . calcium carbonate (TUMS EX) 750 MG chewable tablet Chew 1 tablet by mouth as needed. For gas and reflux.  . cetirizine (ZYRTEC) 10 MG tablet Take 10 mg by mouth daily as needed.   . Cholecalciferol (VITAMIN D3) 1000 UNITS CAPS Take 1,000 Units by mouth daily.   Marland Kitchen doxycycline (VIBRAMYCIN) 100 MG capsule Take 1 capsule by mouth daily.  . fluticasone (FLONASE) 50 MCG/ACT nasal spray Place 2 sprays into both nostrils daily.  . Lutein 6 MG CAPS Take 1 capsule by mouth daily.   . metroNIDAZOLE (METROGEL) 1 % gel Apply 1 application topically daily. Apply thin layer to  affected area externally  . omeprazole (PRILOSEC) 20 MG capsule Take 1 capsule (20 mg total) by mouth 2 (two) times daily before a meal.  . ondansetron (ZOFRAN) 4 MG tablet Take 1 tablet by mouth daily as needed.  . ondansetron (ZOFRAN-ODT) 4 MG disintegrating tablet Take 4 mg by mouth every 8 (eight) hours as needed for nausea.  Vladimir Faster Glycol-Propyl Glycol (SYSTANE OP) Apply 1 drop to eye 2 (two) times daily.    Marland Kitchen tiotropium (SPIRIVA HANDIHALER) 18 MCG inhalation capsule Place 1 capsule (18 mcg total) into inhaler and inhale daily.  . traMADol (ULTRAM) 50 MG tablet Take 1 tablet by mouth Every 6 hours as needed.  . VENTOLIN HFA 108 (90 BASE) MCG/ACT inhaler INHALE 2 PUFFS INTO THE LUNGS EVERY 4 (FOUR) HOURS AS NEEDED FOR WHEEZING.  . [DISCONTINUED] clonazePAM (KLONOPIN) 0.5 MG tablet Take 0.25 mg by mouth at bedtime.   . clonazePAM (KLONOPIN) 1 MG tablet Use 1/2 tab at bedtime  . [DISCONTINUED] Multiple Vitamins-Minerals (ICAPS) CAPS Take 1 capsule by mouth 2 (two) times daily.

## 2014-03-18 NOTE — Patient Instructions (Signed)
I will have Dr Lamonte Sakai review your xrays to look into a bronchoscopy and we will call you about next steps on the nodule No change in medications although I did renew your klonopin 1/2 tablet at bedtime Return 4 months

## 2014-03-20 ENCOUNTER — Encounter: Payer: Self-pay | Admitting: Cardiology

## 2014-03-21 ENCOUNTER — Telehealth: Payer: Self-pay | Admitting: Emergency Medicine

## 2014-03-21 DIAGNOSIS — R911 Solitary pulmonary nodule: Secondary | ICD-10-CM

## 2014-03-21 NOTE — Telephone Encounter (Signed)
ENB scheduling ordered

## 2014-03-21 NOTE — Telephone Encounter (Signed)
Message copied by Collene Gobble on Thu Mar 21, 2014  1:07 PM ------      Message from: Asencion Noble E      Created: Wed Mar 13, 2014  9:51 AM       Rob            pls take a look, can you get to this RUL lesion with ENB??            Growing Rul mass.  Hemoptysis,             Pet positive.            i asked for super D images            pat ------

## 2014-03-22 NOTE — Telephone Encounter (Signed)
Spoke to pt he is aware of his appt 04/03/14  Joellen Jersey

## 2014-03-22 NOTE — Telephone Encounter (Signed)
enb scheduled 04/03/14@8 :30am lmtcb Joellen Jersey

## 2014-04-01 ENCOUNTER — Encounter (HOSPITAL_COMMUNITY)
Admission: RE | Admit: 2014-04-01 | Discharge: 2014-04-01 | Disposition: A | Discharge status: Home / Self Care | Payer: BC Managed Care – PPO | Source: Ambulatory Visit | Attending: Emergency Medicine | Admitting: Emergency Medicine

## 2014-04-01 ENCOUNTER — Encounter (HOSPITAL_COMMUNITY): Payer: Self-pay | Admitting: Pharmacy Technician

## 2014-04-01 ENCOUNTER — Encounter (HOSPITAL_COMMUNITY): Payer: Self-pay

## 2014-04-01 DIAGNOSIS — J449 Chronic obstructive pulmonary disease, unspecified: Secondary | ICD-10-CM | POA: Diagnosis not present

## 2014-04-01 DIAGNOSIS — Z7982 Long term (current) use of aspirin: Secondary | ICD-10-CM | POA: Diagnosis not present

## 2014-04-01 DIAGNOSIS — J4489 Other specified chronic obstructive pulmonary disease: Secondary | ICD-10-CM | POA: Diagnosis not present

## 2014-04-01 DIAGNOSIS — J984 Other disorders of lung: Secondary | ICD-10-CM | POA: Diagnosis not present

## 2014-04-01 DIAGNOSIS — Z79899 Other long term (current) drug therapy: Secondary | ICD-10-CM | POA: Diagnosis not present

## 2014-04-01 DIAGNOSIS — F411 Generalized anxiety disorder: Secondary | ICD-10-CM | POA: Diagnosis not present

## 2014-04-01 DIAGNOSIS — Z87891 Personal history of nicotine dependence: Secondary | ICD-10-CM | POA: Diagnosis not present

## 2014-04-01 DIAGNOSIS — K449 Diaphragmatic hernia without obstruction or gangrene: Secondary | ICD-10-CM | POA: Diagnosis not present

## 2014-04-01 DIAGNOSIS — K219 Gastro-esophageal reflux disease without esophagitis: Secondary | ICD-10-CM | POA: Diagnosis not present

## 2014-04-01 DIAGNOSIS — R222 Localized swelling, mass and lump, trunk: Secondary | ICD-10-CM | POA: Diagnosis present

## 2014-04-01 LAB — BASIC METABOLIC PANEL
Anion gap: 12 (ref 5–15)
BUN: 21 mg/dL (ref 6–23)
CALCIUM: 10.1 mg/dL (ref 8.4–10.5)
CO2: 26 meq/L (ref 19–32)
Chloride: 104 mEq/L (ref 96–112)
Creatinine, Ser: 1.59 mg/dL — ABNORMAL HIGH (ref 0.50–1.35)
GFR calc Af Amer: 48 mL/min — ABNORMAL LOW (ref >90)
GFR, EST NON AFRICAN AMERICAN: 41 mL/min — AB (ref >90)
GLUCOSE: 97 mg/dL (ref 70–99)
Potassium: 5.5 mEq/L — ABNORMAL HIGH (ref 3.7–5.3)
Sodium: 142 mEq/L (ref 137–147)

## 2014-04-01 LAB — CBC
HEMATOCRIT: 47 % (ref 39.0–52.0)
Hemoglobin: 16.4 g/dL (ref 13.0–17.0)
MCH: 30.9 pg (ref 26.0–34.0)
MCHC: 34.9 g/dL (ref 30.0–36.0)
MCV: 88.5 fL (ref 78.0–100.0)
PLATELETS: 206 10*3/uL (ref 150–400)
RBC: 5.31 MIL/uL (ref 4.22–5.81)
RDW: 12.8 % (ref 11.5–15.5)
WBC: 7.1 10*3/uL (ref 4.0–10.5)

## 2014-04-01 LAB — PROTIME-INR
INR: 1.06 (ref 0.00–1.49)
Prothrombin Time: 13.8 seconds (ref 11.6–15.2)

## 2014-04-01 NOTE — Pre-Procedure Instructions (Addendum)
Chris Lewis  04/01/2014   Your procedure is scheduled on:  04/03/14  Report to [facility] short stay admitting at 34 AM.  Call this number if you have problems the morning of surgery: [phone]   Remember:   Do not eat food or drink liquids after midnight.   Take these medicines the morning of surgery with A SIP OF WATER:clonazepam, doxcycline,prilosec, inhalers,(bring to hosp), pain med if needed     Take all meds as ordered until day of surgery except as instructed below or per dr       Chris Lewis all herbel meds, nsaids (aleve,naproxen,advil,ibuprofen) now including  Vitamins, biotin,lutein, aspirin   Do not wear jewelry, make-up or nail polish.  Do not wear lotions, powders, or perfumes. You may wear deodorant.  Do not shave 48 hours prior to surgery. Men may shave face and neck.  Do not bring valuables to the hospital.  [facility] is not responsible                  for any belongings or valuables.               Contacts, dentures or bridgework may not be worn into surgery.  Leave suitcase in the car. After surgery it may be brought to your room.  For patients admitted to the hospital, discharge time is determined by your                treatment team.               Patients discharged the day of surgery will not be allowed to drive  home.  Name and phone number of your driver:   Special Instructions:  Special Instructions: [facility] - Preparing for Surgery  Before surgery, you can play an important role.  Because skin is not sterile, your skin needs to be as free of germs as possible.  You can reduce the number of germs on you skin by washing with CHG (chlorahexidine gluconate) soap before surgery.  CHG is an antiseptic cleaner which kills germs and bonds with the skin to continue killing germs even after washing.  Please DO NOT use if you have an allergy to CHG or antibacterial soaps.  If your skin becomes reddened/irritated stop using the CHG and inform your nurse when you  arrive at Short Stay.  Do not shave (including legs and underarms) for at least 48 hours prior to the first CHG shower.  You may shave your face.  Please follow these instructions carefully:   1.  Shower with CHG Soap the night before surgery and the morning of Surgery.  2.  If you choose to wash your hair, wash your hair first as usual with your normal shampoo.  3.  After you shampoo, rinse your hair and body thoroughly to remove the Shampoo.  4.  Use CHG as you would any other liquid soap.  You can apply chg directly  to the skin and wash gently with scrungie or a clean washcloth.  5.  Apply the CHG Soap to your body ONLY FROM THE NECK DOWN.  Do not use on open wounds or open sores.  Avoid contact with your eyes ears, mouth and genitals (private parts).  Wash genitals (private parts)       with your normal soap.  6.  Wash thoroughly, paying special attention to the area where your surgery will be performed.  7.  Thoroughly rinse your body with warm water  from the neck down.  8.  DO NOT shower/wash with your normal soap after using and rinsing off the CHG Soap.  9.  Pat yourself dry with a clean towel.            10.  Wear clean pajamas.            11.  Place clean sheets on your bed the night of your first shower and do not sleep with pets.  Day of Surgery  Do not apply any lotions/deodorants the morning of surgery.  Please wear clean clothes to the hospital/surgery center.   Please read over the following fact sheets that you were given: Pain Booklet, Coughing and Deep Breathing and Surgical Site Infection Prevention

## 2014-04-02 NOTE — Progress Notes (Signed)
Anesthesia chart review: Patient is a 73 year old male posted for video bronchoscopy with endobronchial navigation on 04/03/14 by Dr. Lamonte Sakai. He has an enlarging RUL nodules since 2012 that was hypermetabolic on PET scan.   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 PCP is Dr. Lavone Orn.  Cardiologist is Dr. Fransico Him, last visit 03/13/14 with one year follow-up recommended. CT surgeon Dr. Roxy Manns. Pulmonologist is Dr. Joya Gaskins.  Dr. Joya Gaskins did not think he could undergo a lung resection at this time based on his lung function (see PFTs fro 12/18/13).  EKG on 03/13/14 showed: NSR, right BBB.  Echo on 09/23/11 showed: LVEF estimated at 66.8%. S/P mitral valve repair with MV ring. Mild tricuspid regurgitation. Doppler findings suggestive of grade 2 diastolic dysfunction with elevated left atrial pressure. Trace pulmonic valve regurgitation.  Cardiac cath on 05/18/11 (done prior to MV repair) showed:  1. Widely patent coronary arteries with left dominant system.  2. Normal left ventricular function.  3. Mitral valve prolapse with severe mitral regurgitation.  CXR on 02/01/14 showed: Emphysema. No active disease.  Chest CT w/o contrast on 03/12/14 showed: 1. Slowly enlarging solid appearing right upper lobe lung lesions surrounded by a interstitial and scarring changes. 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  2. Small pulmonary nodules are unchanged.  3. Severe emphysematous changes without acute overlying pulmonary process.  4. No mediastinal or hilar mass or adenopathy.  Preoperative labs noted. BUN/Cr 21/1.59 up  from 15/1.04 on 09/04/13.  Meds include doxycycline 100 mg daily with no definite stop date and Ultram PRN which could effect his renal function. Most recent notes/labs from Dr. Laurann Montana were from 2014 which also showed normal BUN/Cr at that time.   I discussed with anesthesiologist Dr. Glennon Mac.  Patient with new renal insufficiency and enlarging RUL nodule with need for definitive diagnosis.  Due to the nature of the procedure and diagnosis it is anticipated that he can proceed as planned but will need close follow-up with Dr. Laurann Montana.  I called and left a message with Dr. Delene Ruffini nurse and faxed labs to his office with confirmation. I will also order an ISTAT 8 for the day of surgery to re-evaluate his K+ and ensure no further significant increase in his Creatinine.  [facility] Short Stay Center/Anesthesiology Phone [phone] 04/02/2014 3:43 PM

## 2014-04-03 ENCOUNTER — Encounter (HOSPITAL_COMMUNITY): Payer: BC Managed Care – PPO | Admitting: Vascular Surgery

## 2014-04-03 ENCOUNTER — Ambulatory Visit (HOSPITAL_COMMUNITY)
Admission: RE | Admit: 2014-04-03 | Discharge: 2014-04-03 | Disposition: A | Discharge status: Home / Self Care | Payer: BC Managed Care – PPO | Source: Ambulatory Visit | Attending: Emergency Medicine | Admitting: Emergency Medicine

## 2014-04-03 ENCOUNTER — Encounter (HOSPITAL_COMMUNITY)
Admission: RE | Disposition: A | Discharge status: Home / Self Care | Payer: Self-pay | Source: Ambulatory Visit | Attending: Emergency Medicine

## 2014-04-03 ENCOUNTER — Encounter (HOSPITAL_COMMUNITY): Payer: Self-pay | Admitting: *Deleted

## 2014-04-03 ENCOUNTER — Ambulatory Visit (HOSPITAL_COMMUNITY): Payer: BC Managed Care – PPO | Admitting: Anesthesiology

## 2014-04-03 ENCOUNTER — Ambulatory Visit (HOSPITAL_COMMUNITY): Payer: BC Managed Care – PPO

## 2014-04-03 DIAGNOSIS — J984 Other disorders of lung: Secondary | ICD-10-CM | POA: Insufficient documentation

## 2014-04-03 DIAGNOSIS — R911 Solitary pulmonary nodule: Secondary | ICD-10-CM | POA: Diagnosis present

## 2014-04-03 DIAGNOSIS — J449 Chronic obstructive pulmonary disease, unspecified: Secondary | ICD-10-CM | POA: Insufficient documentation

## 2014-04-03 DIAGNOSIS — Z87891 Personal history of nicotine dependence: Secondary | ICD-10-CM | POA: Insufficient documentation

## 2014-04-03 DIAGNOSIS — K2100 Gastro-esophageal reflux disease with esophagitis, without bleeding: Secondary | ICD-10-CM | POA: Diagnosis not present

## 2014-04-03 DIAGNOSIS — Z7982 Long term (current) use of aspirin: Secondary | ICD-10-CM | POA: Insufficient documentation

## 2014-04-03 DIAGNOSIS — F411 Generalized anxiety disorder: Secondary | ICD-10-CM | POA: Insufficient documentation

## 2014-04-03 DIAGNOSIS — K449 Diaphragmatic hernia without obstruction or gangrene: Secondary | ICD-10-CM | POA: Diagnosis not present

## 2014-04-03 DIAGNOSIS — K219 Gastro-esophageal reflux disease without esophagitis: Secondary | ICD-10-CM | POA: Insufficient documentation

## 2014-04-03 DIAGNOSIS — M1990 Unspecified osteoarthritis, unspecified site: Secondary | ICD-10-CM | POA: Diagnosis not present

## 2014-04-03 DIAGNOSIS — Z79899 Other long term (current) drug therapy: Secondary | ICD-10-CM | POA: Insufficient documentation

## 2014-04-03 DIAGNOSIS — R918 Other nonspecific abnormal finding of lung field: Secondary | ICD-10-CM | POA: Diagnosis not present

## 2014-04-03 DIAGNOSIS — J4489 Other specified chronic obstructive pulmonary disease: Secondary | ICD-10-CM | POA: Insufficient documentation

## 2014-04-03 HISTORY — PX: VIDEO BRONCHOSCOPY WITH ENDOBRONCHIAL NAVIGATION: SHX6175

## 2014-04-03 LAB — POCT I-STAT, CHEM 8
BUN: 25 mg/dL — ABNORMAL HIGH (ref 6–23)
CALCIUM ION: 1.33 mmol/L — AB (ref 1.13–1.30)
CHLORIDE: 100 meq/L (ref 96–112)
Creatinine, Ser: 1.3 mg/dL (ref 0.50–1.35)
GLUCOSE: 85 mg/dL (ref 70–99)
HEMATOCRIT: 49 % (ref 39.0–52.0)
Hemoglobin: 16.7 g/dL (ref 13.0–17.0)
Potassium: 4 mEq/L (ref 3.7–5.3)
Sodium: 141 mEq/L (ref 137–147)
TCO2: 28 mmol/L (ref 0–100)

## 2014-04-03 SURGERY — VIDEO BRONCHOSCOPY WITH ENDOBRONCHIAL NAVIGATION
Anesthesia: General

## 2014-04-03 MED ORDER — ROCURONIUM BROMIDE 100 MG/10ML IV SOLN
INTRAVENOUS | Status: DC | PRN
  Administered 2014-04-03: 25 mg via INTRAVENOUS

## 2014-04-03 MED ORDER — SUCCINYLCHOLINE CHLORIDE 20 MG/ML IJ SOLN
INTRAMUSCULAR | Status: DC | PRN
  Administered 2014-04-03: 120 mg via INTRAVENOUS

## 2014-04-03 MED ORDER — 0.9 % SODIUM CHLORIDE (POUR BTL) OPTIME
TOPICAL | Status: DC | PRN
  Administered 2014-04-03: 1000 mL

## 2014-04-03 MED ORDER — GLYCOPYRROLATE 0.2 MG/ML IJ SOLN
INTRAMUSCULAR | Status: DC | PRN
  Administered 2014-04-03: 0.2 mg via INTRAVENOUS
  Administered 2014-04-03: 0.4 mg via INTRAVENOUS

## 2014-04-03 MED ORDER — PROPOFOL 10 MG/ML IV BOLUS
INTRAVENOUS | Status: AC
  Filled 2014-04-03: qty 20

## 2014-04-03 MED ORDER — FENTANYL CITRATE 0.05 MG/ML IJ SOLN: 25.0000 ug | INTRAMUSCULAR | Status: DC | PRN

## 2014-04-03 MED ORDER — ACETAMINOPHEN 325 MG PO TABS: 325.0000 mg | ORAL_TABLET | ORAL | Status: DC | PRN

## 2014-04-03 MED ORDER — ONDANSETRON HCL 4 MG/2ML IJ SOLN
INTRAMUSCULAR | Status: AC
  Filled 2014-04-03: qty 2

## 2014-04-03 MED ORDER — LACTATED RINGERS IV SOLN
INTRAVENOUS | Status: DC | PRN
  Administered 2014-04-03: 08:00:00 via INTRAVENOUS

## 2014-04-03 MED ORDER — NEOSTIGMINE METHYLSULFATE 10 MG/10ML IV SOLN
INTRAVENOUS | Status: DC | PRN
  Administered 2014-04-03: 3 mg via INTRAVENOUS

## 2014-04-03 MED ORDER — OXYCODONE HCL 5 MG PO TABS: 5.0000 mg | ORAL_TABLET | Freq: Once | ORAL | Status: DC | PRN

## 2014-04-03 MED ORDER — PROMETHAZINE HCL 25 MG/ML IJ SOLN: 6.2500 mg | INTRAMUSCULAR | Status: DC | PRN

## 2014-04-03 MED ORDER — ACETAMINOPHEN 160 MG/5ML PO SOLN
325.0000 mg | ORAL | Status: DC | PRN
  Filled 2014-04-03: qty 20.3

## 2014-04-03 MED ORDER — PHENYLEPHRINE HCL 10 MG/ML IJ SOLN
10.0000 mg | INTRAMUSCULAR | Status: DC | PRN
  Administered 2014-04-03: 50 ug/min via INTRAVENOUS

## 2014-04-03 MED ORDER — FENTANYL CITRATE 0.05 MG/ML IJ SOLN
INTRAMUSCULAR | Status: DC | PRN
  Administered 2014-04-03 (×2): 50 ug via INTRAVENOUS

## 2014-04-03 MED ORDER — ONDANSETRON HCL 4 MG/2ML IJ SOLN
INTRAMUSCULAR | Status: DC | PRN
  Administered 2014-04-03: 4 mg via INTRAVENOUS

## 2014-04-03 MED ORDER — ROCURONIUM BROMIDE 50 MG/5ML IV SOLN
INTRAVENOUS | Status: AC
  Filled 2014-04-03: qty 1

## 2014-04-03 MED ORDER — OXYCODONE HCL 5 MG/5ML PO SOLN: 5.0000 mg | Freq: Once | ORAL | Status: DC | PRN

## 2014-04-03 MED ORDER — LIDOCAINE HCL (CARDIAC) 20 MG/ML IV SOLN
INTRAVENOUS | Status: DC | PRN
  Administered 2014-04-03: 100 mg via INTRAVENOUS
  Administered 2014-04-03: 60 mg via INTRATRACHEAL

## 2014-04-03 MED ORDER — LIDOCAINE HCL (CARDIAC) 20 MG/ML IV SOLN
INTRAVENOUS | Status: AC
  Filled 2014-04-03: qty 10

## 2014-04-03 MED ORDER — EPINEPHRINE HCL 1 MG/ML IJ SOLN
INTRAMUSCULAR | Status: AC
  Filled 2014-04-03: qty 1

## 2014-04-03 MED ORDER — ARTIFICIAL TEARS OP OINT
TOPICAL_OINTMENT | OPHTHALMIC | Status: AC
  Filled 2014-04-03: qty 3.5

## 2014-04-03 MED ORDER — NEOSTIGMINE METHYLSULFATE 10 MG/10ML IV SOLN
INTRAVENOUS | Status: AC
Start: 2014-04-03 — End: 2014-04-03
  Filled 2014-04-03: qty 1

## 2014-04-03 MED ORDER — HYDROMORPHONE HCL PF 1 MG/ML IJ SOLN: 0.2500 mg | INTRAMUSCULAR | Status: DC | PRN

## 2014-04-03 MED ORDER — FENTANYL CITRATE 0.05 MG/ML IJ SOLN
INTRAMUSCULAR | Status: AC
  Filled 2014-04-03: qty 5

## 2014-04-03 MED ORDER — GLYCOPYRROLATE 0.2 MG/ML IJ SOLN
INTRAMUSCULAR | Status: AC
  Filled 2014-04-03: qty 3

## 2014-04-03 MED ORDER — SUCCINYLCHOLINE CHLORIDE 20 MG/ML IJ SOLN
INTRAMUSCULAR | Status: AC
  Filled 2014-04-03: qty 1

## 2014-04-03 MED ORDER — PROPOFOL 10 MG/ML IV BOLUS
INTRAVENOUS | Status: DC | PRN
  Administered 2014-04-03: 150 mg via INTRAVENOUS

## 2014-04-03 SURGICAL SUPPLY — 34 items
BLADE 10 SAFETY STRL DISP (BLADE) ×1 IMPLANT
BRUSH CYTOL CELLEBRITY 1.5X140 (MISCELLANEOUS) ×1 IMPLANT
BRUSH SUPERTRAX BIOPSY (INSTRUMENTS) ×2 IMPLANT
BRUSH SUPERTRAX NDL-TIP CYTO (INSTRUMENTS) ×2 IMPLANT
CANISTER SUCTION 2500CC (MISCELLANEOUS) ×3 IMPLANT
CHANNEL WORK EXTEND EDGE 180 (KITS) IMPLANT
CHANNEL WORK EXTEND EDGE 45 (KITS) IMPLANT
CHANNEL WORK EXTEND EDGE 90 (KITS) IMPLANT
CONT SPEC 4OZ CLIKSEAL STRL BL (MISCELLANEOUS) ×5 IMPLANT
COVER TABLE BACK 60X90 (DRAPES) ×3 IMPLANT
FILTER STRAW FLUID ASPIR (MISCELLANEOUS) IMPLANT
FORCEPS BIOP SUPERTRX PREMAR (INSTRUMENTS) ×2 IMPLANT
GAUZE SPONGE 4X4 12PLY STRL (GAUZE/BANDAGES/DRESSINGS) ×3 IMPLANT
GLOVE BIOGEL M STRL SZ7.5 (GLOVE) ×6 IMPLANT
KIT LOCATABLE GUIDE (CANNULA) IMPLANT
KIT MARKER FIDUCIAL DELIVERY (KITS) IMPLANT
KIT PROCEDURE EDGE 180 (KITS) IMPLANT
KIT PROCEDURE EDGE 45 (KITS) IMPLANT
KIT PROCEDURE EDGE 90 (KITS) ×2 IMPLANT
KIT ROOM TURNOVER OR (KITS) ×3 IMPLANT
MARKER SKIN DUAL TIP RULER LAB (MISCELLANEOUS) ×3 IMPLANT
NDL SUPERTRX PREMARK BIOPSY (NEEDLE) IMPLANT
NEEDLE SUPERTRX PREMARK BIOPSY (NEEDLE) IMPLANT
NS IRRIG 1000ML POUR BTL (IV SOLUTION) ×3 IMPLANT
OIL SILICONE PENTAX (PARTS (SERVICE/REPAIRS)) ×3 IMPLANT
PAD ARMBOARD 7.5X6 YLW CONV (MISCELLANEOUS) ×6 IMPLANT
PATCHES PATIENT (LABEL) ×7 IMPLANT
SYR 20CC LL (SYRINGE) ×3 IMPLANT
SYR 20ML ECCENTRIC (SYRINGE) ×3 IMPLANT
SYR 50ML SLIP (SYRINGE) ×3 IMPLANT
TOWEL OR 17X24 6PK STRL BLUE (TOWEL DISPOSABLE) ×3 IMPLANT
TRAP SPECIMEN MUCOUS 40CC (MISCELLANEOUS) ×2 IMPLANT
TUBE CONNECTING 12'X1/4 (SUCTIONS) ×1
TUBE CONNECTING 12X1/4 (SUCTIONS) ×2 IMPLANT

## 2014-04-03 NOTE — Transfer of Care (Signed)
Immediate Anesthesia Transfer of Care Note  Patient: Chris Lewis  Procedure(s) Performed: Procedure(s): VIDEO BRONCHOSCOPY WITH ENDOBRONCHIAL NAVIGATION (N/A)  Patient Location: PACU  Anesthesia Type:General  Level of Consciousness: awake, alert  and oriented  Airway & Oxygen Therapy: Patient Spontanous Breathing and Patient connected to nasal cannula oxygen  Post-op Assessment: Report given to PACU RN and Post -op Vital signs reviewed and stable  Post vital signs: Reviewed and stable  Complications: No apparent anesthesia complications

## 2014-04-03 NOTE — H&P (View-Only) (Signed)
Subjective:    Patient ID: Chris Lewis, male    DOB: June 12, 1941, 73 y.o.   MRN: [id number]  HPI  03/18/2014 Chief Complaint  Patient presents with  . Follow-up    DOE is unchanged, chest tightness on right upper chest, and prod cough with creamy to yellow mucus.   Dyspnea is the same. Here to discuss RUL lesion Pt denies any significant sore throat, nasal congestion or excess secretions, fever, chills, sweats, unintended weight loss, pleurtic or exertional chest pain, orthopnea PND, or leg swelling Pt denies any increase in rescue therapy over baseline, denies waking up needing it or having any early am or nocturnal exacerbations of coughing/wheezing/or dyspnea. Pt also denies any obvious fluctuation in symptoms with  weather or environmental change or other alleviating or aggravating factors   Review of Systems Constitutional:   No  weight loss, night sweats,  Fevers, chills, fatigue, lassitude. HEENT:   No headaches,  Difficulty swallowing,  Tooth/dental problems,  Sore throat,                No sneezing, itching, ear ache, nasal congestion, post nasal drip,   CV:  No chest pain,  Orthopnea, PND, swelling in lower extremities, anasarca, dizziness, palpitations  GI  No heartburn, indigestion, abdominal pain, nausea, vomiting, diarrhea, change in bowel habits, loss of appetite  Resp: No shortness of breath with exertion or at rest.  No excess mucus, no productive cough,  No non-productive cough,  No coughing up of blood.  No change in color of mucus.  No wheezing.  No chest wall deformity  Skin: no rash or lesions.  GU: no dysuria, change in color of urine, no urgency or frequency.  No flank pain.  MS:  No joint pain or swelling.  No decreased range of motion.  No back pain.  Psych:  No change in mood or affect. No depression or anxiety.  No memory loss.     Objective:   Physical Exam Filed Vitals:   03/18/14 1035  BP: 110/72  Pulse: 70  Temp: 97.1 F (36.2 C)    TempSrc: Oral  Height: 5\' 9"  (1.753 m)  Weight: 165 lb 3.2 oz (74.934 kg)  SpO2: 95%    Gen: Pleasant, well-nourished, in no distress,  normal affect  ENT: No lesions,  mouth clear,  oropharynx clear, no postnasal drip  Neck: No JVD, no TMG, no carotid bruits  Lungs: No use of accessory muscles, no dullness to percussion, distant breath sounds  Cardiovascular: RRR, heart sounds normal, no murmur or gallops, no peripheral edema  Abdomen: soft and NT, no HSM,  BS normal  Musculoskeletal: No deformities, no cyanosis or clubbing  Neuro: alert, non focal  Skin: Warm, no lesions or rashes  No results found.        Assessment & Plan:   Incidental lung nodule, > 23mm and < 65mm Right upper lobe lung nodule increasing in size compared to prior scans dating back to 2012 Malignant range PET uptake Plan Will pursue bronchoscopy with electronic navigational approach for biopsy once results of this are available further recommendations will follow   Updated Medication List Outpatient Encounter Prescriptions as of 03/18/2014  Medication Sig  . amoxicillin (AMOXIL) 500 MG capsule Take 500 mg by mouth. Prior to dental procedures  . aspirin 81 MG tablet Take 81 mg by mouth daily.    . Biotin 1000 MCG tablet Take 1,000 mcg by mouth daily.    . calcium carbonate (OS-CAL) 1250  MG chewable tablet Chew 2 tablets by mouth daily.   . calcium carbonate (TUMS EX) 750 MG chewable tablet Chew 1 tablet by mouth as needed. For gas and reflux.  . cetirizine (ZYRTEC) 10 MG tablet Take 10 mg by mouth daily as needed.   . Cholecalciferol (VITAMIN D3) 1000 UNITS CAPS Take 1,000 Units by mouth daily.   Marland Kitchen doxycycline (VIBRAMYCIN) 100 MG capsule Take 1 capsule by mouth daily.  . fluticasone (FLONASE) 50 MCG/ACT nasal spray Place 2 sprays into both nostrils daily.  . Lutein 6 MG CAPS Take 1 capsule by mouth daily.   . metroNIDAZOLE (METROGEL) 1 % gel Apply 1 application topically daily. Apply thin layer to  affected area externally  . omeprazole (PRILOSEC) 20 MG capsule Take 1 capsule (20 mg total) by mouth 2 (two) times daily before a meal.  . ondansetron (ZOFRAN) 4 MG tablet Take 1 tablet by mouth daily as needed.  . ondansetron (ZOFRAN-ODT) 4 MG disintegrating tablet Take 4 mg by mouth every 8 (eight) hours as needed for nausea.  Vladimir Faster Glycol-Propyl Glycol (SYSTANE OP) Apply 1 drop to eye 2 (two) times daily.    Marland Kitchen tiotropium (SPIRIVA HANDIHALER) 18 MCG inhalation capsule Place 1 capsule (18 mcg total) into inhaler and inhale daily.  . traMADol (ULTRAM) 50 MG tablet Take 1 tablet by mouth Every 6 hours as needed.  . VENTOLIN HFA 108 (90 BASE) MCG/ACT inhaler INHALE 2 PUFFS INTO THE LUNGS EVERY 4 (FOUR) HOURS AS NEEDED FOR WHEEZING.  . [DISCONTINUED] clonazePAM (KLONOPIN) 0.5 MG tablet Take 0.25 mg by mouth at bedtime.   . clonazePAM (KLONOPIN) 1 MG tablet Use 1/2 tab at bedtime  . [DISCONTINUED] Multiple Vitamins-Minerals (ICAPS) CAPS Take 1 capsule by mouth 2 (two) times daily.

## 2014-04-03 NOTE — Anesthesia Procedure Notes (Signed)
Procedure Name: Intubation Date/Time: 04/03/2014 8:34 AM Performed by: Maryland Pink Pre-anesthesia Checklist: Patient identified, Emergency Drugs available, Suction available, Patient being monitored and Timeout performed Patient Re-evaluated:Patient Re-evaluated prior to inductionOxygen Delivery Method: Circle system utilized Preoxygenation: Pre-oxygenation with 100% oxygen Intubation Type: IV induction and Rapid sequence Laryngoscope Size: Mac and 3 Grade View: Grade II Tube type: Oral Tube size: 8.5 mm Number of attempts: 1 Airway Equipment and Method: Stylet and LTA kit utilized Placement Confirmation: ETT inserted through vocal cords under direct vision,  positive ETCO2 and breath sounds checked- equal and bilateral Secured at: 22 cm Tube secured with: Tape Dental Injury: Teeth and Oropharynx as per pre-operative assessment

## 2014-04-03 NOTE — Discharge Instructions (Signed)
Flexible Bronchoscopy, Care After °These instructions give you information on caring for yourself after your procedure. Your doctor may also give you more specific instructions. Call your doctor if you have any problems or questions after your procedure. °HOME CARE °· Do not eat or drink anything for 2 hours after your procedure. If you try to eat or drink before the medicine wears off, food or drink could go into your lungs. You could also burn yourself. °· After 2 hours have passed and when you can cough and gag normally, you may eat soft food and drink liquids slowly. °· The day after the test, you may eat your normal diet. °· You may do your normal activities. °· Keep all doctor visits. °GET HELP RIGHT AWAY IF: °· You get more and more short of breath. °· You get light-headed. °· You feel like you are going to pass out (faint). °· You have chest pain. °· You have new problems that worry you. °· You cough up more than a little blood. °· You cough up more blood than before. °MAKE SURE YOU: °· Understand these instructions. °· Will watch your condition. °· Will get help right away if you are not doing well or get worse. °Document Released: 05/30/2009 Document Revised: 08/07/2013 Document Reviewed: 04/06/2013 °Exit[employer]® Patient Information ©2015 [employer]. This information is not intended to replace advice given to you by your health care provider. Make sure you discuss any questions you have with your health care provider. ° °Please call our office for any problems or questions. [phone].  ° °What to eat: ° °For your first meals, you should eat lightly; only small meals initially.  If you do not have nausea, you may eat larger meals.  Avoid spicy, greasy and heavy food.   ° °General Anesthesia, Adult, Care After  °Refer to this sheet in the next few weeks. These instructions provide you with information on caring for yourself after your procedure. Your health care provider may also give you more specific  instructions. Your treatment has been planned according to current medical practices, but problems sometimes occur. Call your health care provider if you have any problems or questions after your procedure.  °WHAT TO EXPECT AFTER THE PROCEDURE  °After the procedure, it is typical to experience:  °Sleepiness.  °Nausea and vomiting. °HOME CARE INSTRUCTIONS  °For the first 24 hours after general anesthesia:  °Have a responsible person with you.  °Do not drive a car. If you are alone, do not take public transportation.  °Do not drink alcohol.  °Do not take medicine that has not been prescribed by your health care provider.  °Do not sign important papers or make important decisions.  °You may resume a normal diet and activities as directed by your health care provider.  °Change bandages (dressings) as directed.  °If you have questions or problems that seem related to general anesthesia, call the hospital and ask for the anesthetist or anesthesiologist on call. °SEEK MEDICAL CARE IF:  °You have nausea and vomiting that continue the day after anesthesia.  °You develop a rash. °SEEK IMMEDIATE MEDICAL CARE IF:  °You have difficulty breathing.  °You have chest pain.  °You have any allergic problems. °Document Released: 11/08/2000 Document Revised: 04/04/2013 Document Reviewed: 02/15/2013  °Exit[employer]® Patient Information ©2014 [employer].  ° ° °

## 2014-04-03 NOTE — Anesthesia Preprocedure Evaluation (Addendum)
Anesthesia Evaluation  Patient identified by MRN, date of birth, ID band Patient awake    Reviewed: Allergy & Precautions, H&P , NPO status , Patient's Chart, lab work & pertinent test results  History of Anesthesia Complications Negative for: history of anesthetic complications  Airway Mallampati: II TM Distance: >3 FB Neck ROM: Full    Dental  (+) Chipped, Teeth Intact   Pulmonary shortness of breath and with exertion, neg sleep apnea, COPD COPD inhaler, neg recent URI, former smoker,    + decreased breath sounds      Cardiovascular - angina- CAD and - Past MI Rhythm:Regular  S/p mitral valve repair 07/2013 with normal LV size and function, no dysrhythmias or signs of chf   Neuro/Psych PSYCHIATRIC DISORDERS Anxiety Depression  Neuromuscular disease CVA, No Residual Symptoms    GI/Hepatic Neg liver ROS, hiatal hernia, GERD-  Medicated and Controlled,S/p heller myotomy, botox injections, denies heart burn or indigestion since procedures   Endo/Other  negative endocrine ROS  Renal/GU Renal InsufficiencyRenal disease     Musculoskeletal   Abdominal   Peds  Hematology negative hematology ROS (+)   Anesthesia Other Findings   Reproductive/Obstetrics                          Anesthesia Physical Anesthesia Plan  ASA: III  Anesthesia Plan: General   Post-op Pain Management:    Induction: Intravenous  Airway Management Planned: Oral ETT  Additional Equipment: None  Intra-op Plan:   Post-operative Plan: Extubation in OR  Informed Consent: I have reviewed the patients History and Physical, chart, labs and discussed the procedure including the risks, benefits and alternatives for the proposed anesthesia with the patient or authorized representative who has indicated his/her understanding and acceptance.   Dental advisory given  Plan Discussed with: CRNA and Surgeon  Anesthesia Plan Comments:          Anesthesia Quick Evaluation

## 2014-04-03 NOTE — Anesthesia Postprocedure Evaluation (Signed)
Anesthesia Post Note  Patient: Chris Lewis  Procedure(s) Performed: Procedure(s) (LRB): VIDEO BRONCHOSCOPY WITH ENDOBRONCHIAL NAVIGATION (N/A)  Anesthesia type: general  Patient location: PACU  Post pain: Pain level controlled  Post assessment: Patient's Cardiovascular Status Stable  Last Vitals:  Filed Vitals:   04/03/14 1015  BP: 90/58  Pulse: 60  Temp:   Resp: 7    Post vital signs: Reviewed and stable  Level of consciousness: sedated  Complications: No apparent anesthesia complications

## 2014-04-03 NOTE — Op Note (Signed)
Video Bronchoscopy with Electromagnetic Navigation Procedure Note  Date of Operation: 04/03/2014  Pre-op Diagnosis: RUL nodule  Post-op Diagnosis: same  Surgeon: Baltazar Apo  Assistants: none  Anesthesia: General endotracheal anesthesia  Operation: Flexible video fiberoptic bronchoscopy with electromagnetic navigation and biopsies.  Estimated Blood Loss: none  Complications: none apparent  Indications and History: Chris Lewis is a 73 y.o. male with hx of tobacco and COPD. He has been followed for a RUL nodular scar concerning for malignancy. Recommendation was made to pursue tissue diagnosis by navigational bronchoscopy.  The risks, benefits, complications, treatment options and expected outcomes were discussed with the patient.  The possibilities of pneumothorax, pneumonia, reaction to medication, pulmonary aspiration, perforation of a viscus, bleeding, failure to diagnose a condition and creating a complication requiring transfusion or operation were discussed with the patient who freely signed the consent.    Description of Procedure: The patient was seen in the Preoperative Area, was examined and was deemed appropriate to proceed.  The patient was taken to OR10, identified as Chris Lewis and the procedure verified as Flexible Video Fiberoptic Bronchoscopy.  A Time Out was held and the above information confirmed.   Prior to the date of the procedure a high-resolution CT scan of the chest was performed. Utilizing [place] a virtual tracheobronchial tree was generated to allow the creation of distinct navigation pathways to the patient's RUL parenchymal abnormality. After being taken to the operating room general anesthesia was initiated and the patient  was orally intubated. The video fiberoptic bronchoscope was introduced via the endotracheal tube and a general inspection was performed which showed narrowing of a subsegmental L superior segment airway. Otherwise the  entire airway exam was normal. There were scant white secretions. The extendable working channel and locator guide were introduced into the bronchoscope. The distinct navigation pathways prepared prior to this procedure were then utilized to navigate to within 0.3cm of patient's lesion identified on CT scan. The extendable working channel was secured into place and the locator guide was withdrawn. Under fluoroscopic guidance transbronchial brushings, transbronchial Wang needle biopsies, and transbronchial forceps biopsies were performed to be sent for cytology and pathology. A bronchioalveolar lavage was performed in the RUL and sent for cytology. At the end of the procedure a general airway inspection was performed and there was no evidence of active bleeding. The bronchoscope was removed.  The patient tolerated the procedure well. There was no significant blood loss and there were no obvious complications. A post-procedural chest x-ray is pending.  Samples: 1. Transbronchial brushings from RUL 2. Transbronchial Wang needle biopsies from RUL 3. Transbronchial forceps biopsies from RUL 4. Bronchoalveolar lavage from RUL  Plans:  The patient will be discharged from the PACU to home when recovered from anesthesia and after chest x-ray is reviewed. We will review the cytology, pathology results with the patient when they become available. Outpatient followup will be with Dr Joya Gaskins or Dr Lamonte Sakai.   Baltazar Apo, MD, PhD 04/03/2014, 9:40 AM [facility] [phone] or if no answer [phone]

## 2014-04-03 NOTE — Interval H&P Note (Signed)
PCCM Interval Note  S: Pt presents today for FOB and bx of his slowly enlarging RUL nodule.  He describes no changes since he was seen by Dr Joya Gaskins except that his exertional tolerance may actually be a little better - climbing hills and walking.  Some occasionally productive cough.  Filed Vitals:   04/03/14 0626  BP: 116/73  Pulse: 66  Temp: 97.6 F (36.4 C)  TempSrc: Oral  Resp: 20  Height: 5\' 9"  (1.753 m)  Weight: 74.39 kg (164 lb)  SpO2: 99%   Gen: Pleasant, well-nourished, in no distress,  normal affect  Lungs: No use of accessory muscles, distant but no wheeze  Cardiovascular: RRR, heart sounds normal, no murmur or gallops, no peripheral edema  Neuro: alert, non focal  Skin: Warm, no lesions or rashes   Recent Labs Lab 04/01/14 1440 04/03/14 0630  NA 142 141  K 5.5* 4.0  CL 104 100  CO2 26  --   GLUCOSE 97 85  BUN 21 25*  CREATININE 1.59* 1.30  CALCIUM 10.1  --     Recent Labs Lab 04/01/14 1440  INR 1.06    Recent Labs Lab 04/01/14 1440 04/03/14 0630  HGB 16.4 16.7  HCT 47.0 49.0  WBC 7.1  --   PLT 206  --    Plan - will proceed with FOB and ENB for RUL bx's. Discussed with him the surrounding emphysema and the slightly increased risk in him for PTX.   Baltazar Apo, MD, PhD 04/03/2014, 8:20 AM [facility] [phone] or if no answer [phone]

## 2014-04-04 ENCOUNTER — Encounter (HOSPITAL_COMMUNITY): Payer: Self-pay | Admitting: Emergency Medicine

## 2014-04-05 ENCOUNTER — Telehealth: Payer: Self-pay | Admitting: Emergency Medicine

## 2014-04-05 NOTE — Telephone Encounter (Signed)
Called and left message on cell phone that ENB biopsies are all negative. Will need to decide with the pt and w Dr Joya Gaskins what next best approach will be.   Discussed results with pt - he understands that we will either follow w serial CT scan vs consider repeat bx. He has an appt w Dr Joya Gaskins on 9/9 to discuss.

## 2014-04-11 NOTE — Telephone Encounter (Signed)
Pt aware of results   We will regroup at next OV  prob defer to serial CT followup  Thanks rob

## 2014-04-24 ENCOUNTER — Encounter: Payer: Self-pay | Admitting: Critical Care Medicine

## 2014-04-24 ENCOUNTER — Ambulatory Visit (INDEPENDENT_AMBULATORY_CARE_PROVIDER_SITE_OTHER): Payer: BC Managed Care – PPO | Admitting: Critical Care Medicine

## 2014-04-24 VITALS — BP 104/62 | HR 72 | Temp 97.2°F | Ht 69.0 in | Wt 166.0 lb

## 2014-04-24 DIAGNOSIS — R911 Solitary pulmonary nodule: Secondary | ICD-10-CM | POA: Diagnosis not present

## 2014-04-24 DIAGNOSIS — J449 Chronic obstructive pulmonary disease, unspecified: Secondary | ICD-10-CM | POA: Diagnosis not present

## 2014-04-24 DIAGNOSIS — Z23 Encounter for immunization: Secondary | ICD-10-CM | POA: Diagnosis not present

## 2014-04-24 DIAGNOSIS — J441 Chronic obstructive pulmonary disease with (acute) exacerbation: Secondary | ICD-10-CM | POA: Diagnosis not present

## 2014-04-24 DIAGNOSIS — J4489 Other specified chronic obstructive pulmonary disease: Secondary | ICD-10-CM | POA: Diagnosis not present

## 2014-04-24 NOTE — Progress Notes (Signed)
Subjective:    Patient ID: Chris Lewis, male    DOB: [birth year], 73 y.o.   MRN: [id number]  HPI  04/24/2014 Chief Complaint  Patient presents with  . Follow-up    after bx. DOE unchanged.  Cough with creamy to yellow mucus.    Dyspnea same.  Not sleeping. Cough is prod creamy mucus.  No hemoptysis.  No real chest pain but pain in epigastric area is less.   Sleeps in a recliner.    Needs pneumovax and flu vaccine     Review of Systems  Constitutional:   No  weight loss, night sweats,  Fevers, chills, fatigue, lassitude. HEENT:   No headaches,  Difficulty swallowing,  Tooth/dental problems,  Sore throat,                No sneezing, itching, ear ache, nasal congestion, post nasal drip,   CV:  No chest pain,  Orthopnea, PND, swelling in lower extremities, anasarca, dizziness, palpitations  GI  No heartburn, indigestion, abdominal pain, nausea, vomiting, diarrhea, change in bowel habits, loss of appetite  Resp: No shortness of breath with exertion or at rest.  No excess mucus, no productive cough,  No non-productive cough,  No coughing up of blood.  No change in color of mucus.  No wheezing.  No chest wall deformity  Skin: no rash or lesions.  GU: no dysuria, change in color of urine, no urgency or frequency.  No flank pain.  MS:  No joint pain or swelling.  No decreased range of motion.  No back pain.  Psych:  No change in mood or affect. No depression or anxiety.  No memory loss.     Objective:   Physical Exam  Filed Vitals:   04/24/14 0854  BP: 104/62  Pulse: 72  Temp: 97.2 F (36.2 C)  TempSrc: Oral  Height: 5\' 9"  (1.753 m)  Weight: 166 lb (75.297 kg)  SpO2: 96%    Gen: Pleasant, well-nourished, in no distress,  normal affect  ENT: No lesions,  mouth clear,  oropharynx clear, no postnasal drip  Neck: No JVD, no TMG, no carotid bruits  Lungs: No use of accessory muscles, no dullness to percussion, distant breath sounds  Cardiovascular: RRR, heart sounds  normal, no murmur or gallops, no peripheral edema  Abdomen: soft and NT, no HSM,  BS normal  Musculoskeletal: No deformities, no cyanosis or clubbing  Neuro: alert, non focal  Skin: Warm, no lesions or rashes  No results found.        Assessment & Plan:   Acute exacerbation of chronic obstructive airways disease Stable lung function at present  Obstructive chronic bronchitis without exacerbation, due to chronic aspiration Gold C Cont current inhaled meds Flu vaccine given along with pneumovax  Nodule of right lung Recent electronavigational FOB neg for CA in RUL Plan Repeat CT chest in 3 months Observation for now     Updated Medication List Outpatient Encounter Prescriptions as of 04/24/2014  Medication Sig  . albuterol (PROVENTIL HFA;VENTOLIN HFA) 108 (90 BASE) MCG/ACT inhaler Inhale 2 puffs into the lungs every 6 (six) hours as needed for wheezing or shortness of breath.  Marland Kitchen amoxicillin (AMOXIL) 500 MG capsule Take 500 mg by mouth as needed (prior to dental procedures).   Marland Kitchen aspirin 81 MG tablet Take 81 mg by mouth daily.   . Biotin 1000 MCG tablet Take 1,000 mcg by mouth daily.    . calcium carbonate (OS-CAL - DOSED IN MG OF  ELEMENTAL CALCIUM) 1250 MG tablet Take 2 tablets by mouth daily with breakfast.  . calcium carbonate (TUMS EX) 750 MG chewable tablet Chew 1 tablet by mouth as needed (for gas and reflux).   . cetirizine (ZYRTEC) 10 MG tablet Take 10 mg by mouth daily as needed for allergies.   . Cholecalciferol (VITAMIN D3) 1000 UNITS CAPS Take 1,000 Units by mouth daily.   . clonazePAM (KLONOPIN) 1 MG tablet Take 0.5 mg by mouth at bedtime.  Marland Kitchen doxycycline (VIBRAMYCIN) 100 MG capsule Take 1 capsule by mouth daily.  . fluticasone (FLONASE) 50 MCG/ACT nasal spray Place 2 sprays into both nostrils daily.  . Lutein 6 MG CAPS Take 1 capsule by mouth daily.   . metroNIDAZOLE (METROGEL) 1 % gel Apply 1 application topically daily as needed (to skin). Apply thin layer to  affected area externally  . omeprazole (PRILOSEC) 20 MG capsule Take 1 capsule (20 mg total) by mouth 2 (two) times daily before a meal.  . ondansetron (ZOFRAN) 4 MG tablet Take 4 mg by mouth every 8 (eight) hours as needed for nausea.   Vladimir Faster Glycol-Propyl Glycol (SYSTANE OP) Apply 1 drop to eye 2 (two) times daily.    Marland Kitchen tiotropium (SPIRIVA HANDIHALER) 18 MCG inhalation capsule Place 1 capsule (18 mcg total) into inhaler and inhale daily.  . traMADol (ULTRAM) 50 MG tablet Take 50 mg by mouth Every 6 hours as needed for moderate pain.

## 2014-04-24 NOTE — Patient Instructions (Signed)
Flu vaccine and pneumovax was given No other changes in medications Return 3 months with CT Chest

## 2014-04-24 NOTE — Assessment & Plan Note (Addendum)
Cont current inhaled meds Flu vaccine given along with pneumovax

## 2014-04-24 NOTE — Assessment & Plan Note (Signed)
Stable lung function at present

## 2014-04-24 NOTE — Assessment & Plan Note (Signed)
Recent electronavigational FOB neg for CA in RUL Plan Repeat CT chest in 3 months Observation for now

## 2014-05-21 ENCOUNTER — Other Ambulatory Visit: Payer: Self-pay | Admitting: Critical Care Medicine

## 2014-05-31 ENCOUNTER — Other Ambulatory Visit: Payer: Self-pay | Admitting: Internal Medicine

## 2014-07-25 ENCOUNTER — Ambulatory Visit (INDEPENDENT_AMBULATORY_CARE_PROVIDER_SITE_OTHER)
Admission: RE | Admit: 2014-07-25 | Discharge: 2014-07-25 | Disposition: A | Discharge status: Home / Self Care | Payer: BC Managed Care – PPO | Source: Ambulatory Visit | Attending: Critical Care Medicine | Admitting: Critical Care Medicine

## 2014-07-25 DIAGNOSIS — R911 Solitary pulmonary nodule: Secondary | ICD-10-CM

## 2014-07-31 ENCOUNTER — Encounter: Payer: Self-pay | Admitting: Critical Care Medicine

## 2014-07-31 ENCOUNTER — Encounter (INDEPENDENT_AMBULATORY_CARE_PROVIDER_SITE_OTHER): Payer: Self-pay

## 2014-07-31 ENCOUNTER — Ambulatory Visit (INDEPENDENT_AMBULATORY_CARE_PROVIDER_SITE_OTHER): Payer: BC Managed Care – PPO | Admitting: Critical Care Medicine

## 2014-07-31 VITALS — BP 122/66 | HR 67 | Ht 69.0 in | Wt 168.0 lb

## 2014-07-31 DIAGNOSIS — R911 Solitary pulmonary nodule: Secondary | ICD-10-CM

## 2014-07-31 DIAGNOSIS — J449 Chronic obstructive pulmonary disease, unspecified: Secondary | ICD-10-CM | POA: Diagnosis not present

## 2014-07-31 MED ORDER — CLONAZEPAM 1 MG PO TABS: 1.0000 mg | ORAL_TABLET | Freq: Two times a day (BID) | ORAL | Status: DC

## 2014-07-31 MED ORDER — TIOTROPIUM BROMIDE MONOHYDRATE 18 MCG IN CAPS: 18.0000 ug | ORAL_CAPSULE | Freq: Every day | RESPIRATORY_TRACT | Status: DC

## 2014-07-31 NOTE — Progress Notes (Signed)
Subjective:    Patient ID: [name], male    DOB: April 17, 1941, 73 y.o.   MRN: [id number]  HPI  07/31/2014 Chief Complaint  Patient presents with  . Follow-up    Pt has no breathing complaints at this time.   No issues with dyspnea .  CT Scan no changed.  Pt denies any significant sore throat, nasal congestion or excess secretions, fever, chills, sweats, unintended weight loss, pleurtic or exertional chest pain, orthopnea PND, or leg swelling Pt denies any increase in rescue therapy over baseline, denies waking up needing it or having any early am or nocturnal exacerbations of coughing/wheezing/or dyspnea. Pt also denies any obvious fluctuation in symptoms with  weather or environmental change or other alleviating or aggravating factors  Review of Systems  Constitutional:   No  weight loss, night sweats,  Fevers, chills, fatigue, lassitude. HEENT:   No headaches,  Difficulty swallowing,  Tooth/dental problems,  Sore throat,                No sneezing, itching, ear ache, nasal congestion, post nasal drip,   CV:  No chest pain,  Orthopnea, PND, swelling in lower extremities, anasarca, dizziness, palpitations  GI  No heartburn, indigestion, abdominal pain, nausea, vomiting, diarrhea, change in bowel habits, loss of appetite  Resp: No shortness of breath with exertion or at rest.  No excess mucus, no productive cough,  No non-productive cough,  No coughing up of blood.  No change in color of mucus.  No wheezing.  No chest wall deformity  Skin: no rash or lesions.  GU: no dysuria, change in color of urine, no urgency or frequency.  No flank pain.  MS:  No joint pain or swelling.  No decreased range of motion.  No back pain.  Psych:  No change in mood or affect. No depression or anxiety.  No memory loss.     Objective:   Physical Exam  Filed Vitals:   07/31/14 1441  BP: 122/66  Pulse: 67  Height: 5\' 9"  (1.753 m)  Weight: 168 lb (76.204 kg)  SpO2: 97%    Gen: Pleasant,  well-nourished, in no distress,  normal affect  ENT: No lesions,  mouth clear,  oropharynx clear, no postnasal drip  Neck: No JVD, no TMG, no carotid bruits  Lungs: No use of accessory muscles, no dullness to percussion, distant breath sounds  Cardiovascular: RRR, heart sounds normal, no murmur or gallops, no peripheral edema  Abdomen: soft and NT, no HSM,  BS normal  Musculoskeletal: No deformities, no cyanosis or clubbing  Neuro: alert, non focal  Skin: Warm, no lesions or rashes  No results found.     Assessment & Plan:   Obstructive chronic bronchitis without exacerbation, due to chronic aspiration Gold C Gold C obstructive bronchitis with no exacerbation Plan Stay on spiriva  OK to increase klonopin to 1mg  at bedtime Return 6 months Repeat Ct Chest 8 months   Nodule of right lung RUL lesion stable on current CT Chest , c/w inflammation, doubt CA Plan  Repeat CT Chest in 04/2015    Updated Medication List Outpatient Encounter Prescriptions as of 07/31/2014  Medication Sig  . amoxicillin (AMOXIL) 500 MG capsule Take 500 mg by mouth as needed (prior to dental procedures).   Marland Kitchen aspirin 81 MG tablet Take 81 mg by mouth daily.   . Biotin 1000 MCG tablet Take 1,000 mcg by mouth daily.    . calcium carbonate (OS-CAL - DOSED IN  MG OF ELEMENTAL CALCIUM) 1250 MG tablet Take 2 tablets by mouth daily with breakfast.  . calcium carbonate (TUMS EX) 750 MG chewable tablet Chew 1 tablet by mouth as needed (for gas and reflux).   . cetirizine (ZYRTEC) 10 MG tablet Take 10 mg by mouth daily as needed for allergies.   . Cholecalciferol (VITAMIN D3) 1000 UNITS CAPS Take 1,000 Units by mouth daily.   . fluticasone (FLONASE) 50 MCG/ACT nasal spray Place 2 sprays into both nostrils daily.  . Lutein 6 MG CAPS Take 1 capsule by mouth daily.   . metroNIDAZOLE (METROGEL) 1 % gel Apply 1 application topically daily as needed (to skin). Apply thin layer to affected area externally  .  ondansetron (ZOFRAN) 4 MG tablet Take 4 mg by mouth every 8 (eight) hours as needed for nausea.   Vladimir Faster Glycol-Propyl Glycol (SYSTANE OP) Apply 1 drop to eye 2 (two) times daily.    Marland Kitchen tiotropium (SPIRIVA HANDIHALER) 18 MCG inhalation capsule Place 1 capsule (18 mcg total) into inhaler and inhale daily.  . traMADol (ULTRAM) 50 MG tablet Take 50 mg by mouth Every 6 hours as needed for moderate pain.   . VENTOLIN HFA 108 (90 BASE) MCG/ACT inhaler INHALE 2 PUFFS INTO THE LUNGS EVERY 4 (FOUR) HOURS AS NEEDED FOR WHEEZING.  . [DISCONTINUED] clonazePAM (KLONOPIN) 1 MG tablet Take 0.5 mg by mouth at bedtime.  . [DISCONTINUED] tiotropium (SPIRIVA HANDIHALER) 18 MCG inhalation capsule Place 1 capsule (18 mcg total) into inhaler and inhale daily.  . clonazePAM (KLONOPIN) 1 MG tablet Take 1 tablet (1 mg total) by mouth 2 (two) times daily.  Marland Kitchen omeprazole (PRILOSEC) 20 MG capsule TAKE 1 CAPSULE (20 MG TOTAL) BY MOUTH 2 (TWO) TIMES DAILY BEFORE A MEAL. (Patient not taking: Reported on 07/31/2014)  . [DISCONTINUED] albuterol (PROVENTIL HFA;VENTOLIN HFA) 108 (90 BASE) MCG/ACT inhaler Inhale 2 puffs into the lungs every 6 (six) hours as needed for wheezing or shortness of breath.  . [DISCONTINUED] doxycycline (VIBRAMYCIN) 100 MG capsule Take 1 capsule by mouth daily.

## 2014-07-31 NOTE — Patient Instructions (Signed)
Stay on spiriva  OK to increase klonopin to 1mg  at bedtime Return 6 months Repeat Ct Chest 8 months

## 2014-08-01 NOTE — Assessment & Plan Note (Signed)
Gold C obstructive bronchitis with no exacerbation Plan Stay on spiriva  OK to increase klonopin to 1mg  at bedtime Return 6 months Repeat Ct Chest 8 months

## 2014-08-01 NOTE — Assessment & Plan Note (Signed)
RUL lesion stable on current CT Chest , c/w inflammation, doubt CA Plan  Repeat CT Chest in 04/2015

## 2014-09-23 DIAGNOSIS — L718 Other rosacea: Secondary | ICD-10-CM | POA: Diagnosis not present

## 2014-09-23 DIAGNOSIS — L821 Other seborrheic keratosis: Secondary | ICD-10-CM | POA: Diagnosis not present

## 2014-10-07 ENCOUNTER — Telehealth: Payer: Self-pay | Admitting: Critical Care Medicine

## 2014-10-07 DIAGNOSIS — R0781 Pleurodynia: Secondary | ICD-10-CM

## 2014-10-07 NOTE — Telephone Encounter (Signed)
Called and spoke to pt. Pt stated he injured himself at work on 09/29/14 and feels he might have fractured a rib on right side. Pt c/o 8/10 pain upon inspiration and 3/10 pain when resting. Pt stated he is taking percocet 7.5-325 when needed, pt stated he only has 2 tabs left from a dental procedure. Pt stated he has tramadol but is not taking because it is ineffective in treating his pain. Pt denies acute SOB.   Dr. Joya Gaskins please advise.

## 2014-10-07 NOTE — Telephone Encounter (Signed)
Called and spoke to pt. Appt made with TP on 10/14/2014. CXR orders already placed. Pt verbalized understanding and denied any further questions or concerns at this time.

## 2014-10-07 NOTE — Telephone Encounter (Signed)
Needs a PA and LAT CXR and Right rib detail films AND needs OV with TP

## 2014-10-08 ENCOUNTER — Ambulatory Visit
Admission: RE | Admit: 2014-10-08 | Discharge: 2014-10-08 | Disposition: A | Discharge status: Home / Self Care | Payer: Medicare Other | Source: Ambulatory Visit | Attending: Geriatric Medicine | Admitting: Geriatric Medicine

## 2014-10-08 ENCOUNTER — Other Ambulatory Visit: Payer: Self-pay | Admitting: Geriatric Medicine

## 2014-10-08 DIAGNOSIS — R0789 Other chest pain: Secondary | ICD-10-CM | POA: Diagnosis not present

## 2014-10-14 ENCOUNTER — Ambulatory Visit: Payer: Medicare Other | Admitting: Adult Health

## 2014-10-29 DIAGNOSIS — H3531 Nonexudative age-related macular degeneration: Secondary | ICD-10-CM | POA: Diagnosis not present

## 2014-12-09 ENCOUNTER — Encounter: Payer: Self-pay | Admitting: Cardiology

## 2014-12-30 ENCOUNTER — Other Ambulatory Visit: Payer: Self-pay | Admitting: Critical Care Medicine

## 2015-02-05 ENCOUNTER — Ambulatory Visit: Payer: BLUE CROSS/BLUE SHIELD | Admitting: Critical Care Medicine

## 2015-03-06 ENCOUNTER — Ambulatory Visit: Payer: BLUE CROSS/BLUE SHIELD | Admitting: Critical Care Medicine

## 2015-03-12 ENCOUNTER — Ambulatory Visit (INDEPENDENT_AMBULATORY_CARE_PROVIDER_SITE_OTHER): Payer: BLUE CROSS/BLUE SHIELD | Admitting: Critical Care Medicine

## 2015-03-12 ENCOUNTER — Encounter: Payer: Self-pay | Admitting: Critical Care Medicine

## 2015-03-12 VITALS — BP 104/66 | HR 62 | Temp 97.5°F | Ht 68.0 in | Wt 168.0 lb

## 2015-03-12 DIAGNOSIS — R911 Solitary pulmonary nodule: Secondary | ICD-10-CM | POA: Diagnosis not present

## 2015-03-12 DIAGNOSIS — J449 Chronic obstructive pulmonary disease, unspecified: Secondary | ICD-10-CM | POA: Diagnosis not present

## 2015-03-12 MED ORDER — TIOTROPIUM BROMIDE MONOHYDRATE 18 MCG IN CAPS: 18.0000 ug | ORAL_CAPSULE | Freq: Every day | RESPIRATORY_TRACT | Status: DC

## 2015-03-12 NOTE — Progress Notes (Signed)
Subjective:    Patient ID: Chris Lewis, male    DOB: 1941-06-12, 74 y.o.   MRN: [id number]  HPI 03/12/2015 Chief Complaint  Patient presents with  . Follow-up    Pt states overall breathing is doing well. Pt has no new compliants.  Overall doing well.  Pt is limited in activities around the house d/t lower back issues.  Notes some cough with mucus but is just in AM.  No real chest pain. Dyspnea is stable. Not using SABA Pt denies any significant sore throat, nasal congestion or excess secretions, fever, chills, sweats, unintended weight loss, pleurtic or exertional chest pain, orthopnea PND, or leg swelling Pt denies any increase in rescue therapy over baseline, denies waking up needing it or having any early am or nocturnal exacerbations of coughing/wheezing/or dyspnea. Pt also denies any obvious fluctuation in symptoms with  weather or environmental change or other alleviating or aggravating factors   Current Medications, Allergies, Complete Past Medical History, Past Surgical History, Family History, and Social History were reviewed in [facility] record per todays encounter:  03/12/2015   Review of Systems  Constitutional: Negative.   HENT: Negative.  Negative for ear pain, postnasal drip, rhinorrhea, sinus pressure, sore throat, trouble swallowing and voice change.   Eyes: Negative.   Respiratory: Positive for cough and shortness of breath. Negative for apnea, choking, chest tightness, wheezing and stridor.   Cardiovascular: Negative.  Negative for chest pain, palpitations and leg swelling.  Gastrointestinal: Negative.  Negative for nausea, vomiting, abdominal pain and abdominal distention.  Genitourinary: Negative.   Musculoskeletal: Positive for back pain. Negative for myalgias and arthralgias.  Skin: Negative.  Negative for rash.  Allergic/Immunologic: Negative.  Negative for environmental allergies and food allergies.  Neurological: Negative.  Negative  for dizziness, syncope, weakness and headaches.  Hematological: Negative.  Negative for adenopathy. Does not bruise/bleed easily.  Psychiatric/Behavioral: Negative.  Negative for sleep disturbance and agitation. The patient is not nervous/anxious.        Objective:   Physical Exam Filed Vitals:   03/12/15 0857  BP: 104/66  Pulse: 62  SpO2: 97%    Gen: Pleasant, well-nourished, in no distress,  normal affect  ENT: No lesions,  mouth clear,  oropharynx clear, no postnasal drip  Neck: No JVD, no TMG, no carotid bruits  Lungs: No use of accessory muscles, no dullness to percussion, distant BS   Cardiovascular: RRR, heart sounds normal, no murmur or gallops, no peripheral edema  Abdomen: soft and NT, no HSM,  BS normal  Musculoskeletal: No deformities, no cyanosis or clubbing  Neuro: alert, non focal  Skin: Warm, no lesions or rashes  No results found.     Assessment & Plan:  I personally reviewed all images and lab data in the [facility] system as well as any outside material available during this office visit and agree with the  radiology impressions.   Obstructive chronic bronchitis without exacerbation, due to chronic aspiration Gold C Stable Gold C Copd with hx of microaspiration and necrotic PNA upper lobes d/t achalasia, now better with Rx of achalasia Plan Cont inhaled meds rov 4 months   Nodule of right lung RUL nodule likely inflammatory Repeat CT chest 04/2015    Story was seen today for follow-up.  Diagnoses and all orders for this visit:  Obstructive chronic bronchitis without exacerbation, due to chronic aspiration Gold C  Lung nodule Orders: -     CT Chest Wo Contrast; Future  Nodule of  right lung  Other orders -     tiotropium (SPIRIVA HANDIHALER) 18 MCG inhalation capsule; Place 1 capsule (18 mcg total) into inhaler and inhale daily.

## 2015-03-12 NOTE — Assessment & Plan Note (Signed)
RUL nodule likely inflammatory Repeat CT chest 04/2015

## 2015-03-12 NOTE — Patient Instructions (Signed)
No change in medications. Return in         4 months 

## 2015-03-12 NOTE — Assessment & Plan Note (Signed)
Stable Gold C Copd with hx of microaspiration and necrotic PNA upper lobes d/t achalasia, now better with Rx of achalasia Plan Cont inhaled meds rov 4 months

## 2015-03-26 ENCOUNTER — Encounter: Payer: Self-pay | Admitting: Cardiology

## 2015-03-26 ENCOUNTER — Ambulatory Visit (INDEPENDENT_AMBULATORY_CARE_PROVIDER_SITE_OTHER): Payer: BLUE CROSS/BLUE SHIELD | Admitting: Cardiology

## 2015-03-26 VITALS — BP 110/60 | HR 59 | Ht 68.0 in | Wt 167.8 lb

## 2015-03-26 DIAGNOSIS — I341 Nonrheumatic mitral (valve) prolapse: Secondary | ICD-10-CM | POA: Diagnosis not present

## 2015-03-26 DIAGNOSIS — Z9889 Other specified postprocedural states: Secondary | ICD-10-CM

## 2015-03-26 DIAGNOSIS — I10 Essential (primary) hypertension: Secondary | ICD-10-CM | POA: Diagnosis not present

## 2015-03-26 NOTE — Patient Instructions (Addendum)

## 2015-03-26 NOTE — Progress Notes (Signed)
Cardiology Office Note   Date:  03/26/2015   ID:  Chris Lewis, DOB [birth year], MRN [id number]  PCP:  Irven Shelling, MD    Chief Complaint  Patient presents with  . Follow-up    mitral regurgitation due to cusp prolapse      History of Present Illness: Chris Lewis is a 74y.o. 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 He is doing well. He denies any chest pain, LE edema, dizziness, palpitations. He has some mild chronic DOE but exercises without any problems.   Past Medical History  Diagnosis Date  . Achalasia   . Hypercholesterolemia   . Seasonal allergic rhinitis   . Transient ischemic attack   . Mitral valve regurgitation   . Osteopenia   . FH: cholecystectomy   . S/P right inguinal herniorrhaphy   . MVP (mitral valve prolapse)   . MR (mitral regurgitation)   . GERD (gastroesophageal reflux disease)   . Shortness of breath     sometimes at rest  . Stroke 10/2002  . Arthritis   . Anxiety     causes shortness  . Hiatal hernia   . Depression   . Chronic obstructive pulmonary disease   . COPD (chronic obstructive pulmonary disease)   . Lesion of right lung 06/04/2013  . Heart murmur     hx mitral valve surgery  . Pneumonia 06/04/2013    aspirated food 14    Past Surgical History  Procedure Laterality Date  . Back surgery      x 3  . Foot surgery Left     screw -fx  . Esophagogastroduodenoscopy and savary esophageal dilation.      x9  . Eyebrow surgery  2012  . Rt inguinal herniorrhaphy  1969/1997  . Esophagoscopy w/ botox injection      x9 beginning 2008, most recent Sept. 2012  . Tee without cardioversion  07/06/2011    Procedure: TRANSESOPHAGEAL ECHOCARDIOGRAM (TEE);  Surgeon: Sueanne Margarita, MD;  Location: [facility] ENDOSCOPY;  Service: Cardiovascular;  Laterality: N/A;  . Cholecystectomy    . Mitral valve repair  08/03/2011    Procedure: MINIMALLY  INVASIVE MITRAL VALVE REPAIR (MVR);  Surgeon: Rexene Alberts, MD;  Location: [place];  Service: Open Heart Surgery;  Laterality: Right;  minimally invasive MVR  . Esophagogastroduodenoscopy  01/13/2012    Procedure: ESOPHAGOGASTRODUODENOSCOPY (EGD);  Surgeon: Garlan Fair, MD;  Location: [facility] ENDOSCOPY;  Service: Endoscopy;  Laterality: N/A;  botox /katrina  . Esophagogastroduodenoscopy  06/27/2012    Procedure: ESOPHAGOGASTRODUODENOSCOPY (EGD);  Surgeon: Garlan Fair, MD;  Location: [facility] ENDOSCOPY;  Service: Endoscopy;  Laterality: N/A;  . Botox injection  06/27/2012    Procedure: BOTOX INJECTION;  Surgeon: Garlan Fair, MD;  Location: [facility] ENDOSCOPY;  Service: Endoscopy;  Laterality: N/A;  . Esophagogastroduodenoscopy (egd) with esophageal dilation N/A 11/22/2012    Procedure: ESOPHAGOGASTRODUODENOSCOPY (EGD) WITH BOTOX INJECTION;  Surgeon: Garlan Fair, MD;  Location: [facility] ENDOSCOPY;  Service: Endoscopy;  Laterality: N/A;  . Laparoscopic heller myotomy  July 2014    [facility]  . Video bronchoscopy with endobronchial navigation N/A 04/03/2014    Procedure: VIDEO BRONCHOSCOPY WITH ENDOBRONCHIAL NAVIGATION;  Surgeon: Collene Gobble, MD;  Location: [place];  Service: Thoracic;  Laterality: N/A;     Current Outpatient Prescriptions  Medication Sig  Dispense Refill  . aspirin 81 MG tablet Take 81 mg by mouth daily.     . Biotin 1000 MCG tablet Take 1,000 mcg by mouth daily.      . calcium carbonate (OS-CAL - DOSED IN MG OF ELEMENTAL CALCIUM) 1250 MG tablet Take 2 tablets by mouth daily with breakfast.    . calcium carbonate (TUMS EX) 750 MG chewable tablet Chew 1 tablet by mouth as needed (for gas and reflux).     . cetirizine (ZYRTEC) 10 MG tablet Take 10 mg by mouth daily as needed for allergies.     . Cholecalciferol (VITAMIN D3) 1000 UNITS CAPS Take 1,000 Units by mouth daily.     . clonazePAM (KLONOPIN) 1 MG tablet Take 1 tablet (1 mg total) by mouth 2 (two) times daily. 30 tablet 4  .  fluticasone (FLONASE) 50 MCG/ACT nasal spray Place 2 sprays into both nostrils daily. 16 g 6  . metroNIDAZOLE (METROGEL) 1 % gel Apply 1 application topically daily as needed (to skin). Apply thin layer to affected area externally    . Multiple Vitamins-Minerals (ICAPS AREDS 2) CAPS Take 2 capsules by mouth daily.    . ondansetron (ZOFRAN) 4 MG tablet Take 4 mg by mouth every 8 (eight) hours as needed for nausea.     . pantoprazole (PROTONIX) 40 MG tablet Take 40 mg by mouth daily before breakfast.    . Polyethyl Glycol-Propyl Glycol (SYSTANE OP) Apply 1 drop to eye 2 (two) times daily.      Marland Kitchen tiotropium (SPIRIVA HANDIHALER) 18 MCG inhalation capsule Place 1 capsule (18 mcg total) into inhaler and inhale daily. 30 capsule 11  . traMADol (ULTRAM) 50 MG tablet Take 50 mg by mouth Every 6 hours as needed for moderate pain.     . VENTOLIN HFA 108 (90 BASE) MCG/ACT inhaler INHALE 2 PUFFS INTO THE LUNGS EVERY 4 (FOUR) HOURS AS NEEDED FOR WHEEZING. 18 Inhaler 0   No current facility-administered medications for this visit.    Allergies:   Duac; Contrast media; Atorvastatin; Propoxyphene n-acetaminophen; Rosuvastatin; Hydrocodone; Metrizamide; and Other    Social History:  The patient  reports that he quit smoking about 17 years ago. His smoking use included Cigarettes. He has a 57 pack-year smoking history. He has never used smokeless tobacco. He reports that he does not drink alcohol or use illicit drugs.   Family History:  The patient's family history includes Cancer in his sister; Heart disease in his sister; Lung cancer in his sister; Scleroderma (age of onset: 48) in his mother; Stroke (age of onset: 14) in his father. There is no history of Anesthesia problems.    ROS:  Please see the history of present illness.   Otherwise, review of systems are positive for muscle and back pain and anxiety   All other systems are reviewed and negative.    PHYSICAL EXAM: VS:  BP 110/60 mmHg  Pulse 59  Ht  5\' 8"  (1.727 m)  Wt 167 lb 12.8 oz (76.114 kg)  BMI 25.52 kg/m2 , BMI Body mass index is 25.52 kg/(m^2). GEN: Well nourished, well developed, in no acute distress HEENT: normal Neck: no JVD, carotid bruits, or masses Cardiac: RRR; no murmurs, rubs, or gallops,no edema  Respiratory:  clear to auscultation bilaterally, normal work of breathing GI: soft, nontender, nondistended, + BS MS: no deformity or atrophy Skin: warm and dry, no rash Neuro:  Strength and sensation are intact Psych: euthymic mood, full affect   EKG:  EKG is ordered today. The ekg ordered today demonstrates sinus bradycardia at 59bpm with RBBB   Recent Labs: 04/01/2014: Platelets 206 04/03/2014: BUN 25*; Creatinine, Ser 1.30; Hemoglobin 16.7; Potassium 4.0; Sodium 141    Lipid Panel No results found for: CHOL, TRIG, HDL, CHOLHDL, VLDL, LDLCALC, LDLDIRECT    Wt Readings from Last 3 Encounters:  03/26/15 167 lb 12.8 oz (76.114 kg)  03/12/15 168 lb (76.204 kg)  07/31/14 168 lb (76.204 kg)    ASSESSMENT AND PLAN:  1. HTN - well controlled 2. MVP with severe MR s/p MV repair - continue ASA   3.  Chronic RBBB   Current medicines are reviewed at length with the patient today.  The patient does not have concerns regarding medicines.  The following changes have been made:  no change  Labs/ tests ordered today: See above Assessment and Plan No orders of the defined types were placed in this encounter.     Disposition:   FU with me in 1 year  Signed, Sueanne Margarita, MD  03/26/2015 3:17 PM    [facility] [address], [place], [place]  [address] Phone: [phone]; Fax: [phone]

## 2015-04-20 ENCOUNTER — Other Ambulatory Visit: Payer: Self-pay | Admitting: Critical Care Medicine

## 2015-04-22 NOTE — Telephone Encounter (Signed)
Ok this time, let pt know his PCP will need to assume the ownership of this medication

## 2015-04-22 NOTE — Telephone Encounter (Signed)
Rx was called to pharm and pharmacist made aware defer to PCP in the future

## 2015-04-22 NOTE — Telephone Encounter (Signed)
Patient requesting refill of Klonipin Last refilled: 07/31/14 Last OV: 03/12/15 No OV scheduled.  Ok to refill?

## 2015-04-29 DIAGNOSIS — L814 Other melanin hyperpigmentation: Secondary | ICD-10-CM | POA: Diagnosis not present

## 2015-04-29 DIAGNOSIS — D1801 Hemangioma of skin and subcutaneous tissue: Secondary | ICD-10-CM | POA: Diagnosis not present

## 2015-04-29 DIAGNOSIS — L821 Other seborrheic keratosis: Secondary | ICD-10-CM | POA: Diagnosis not present

## 2015-05-07 ENCOUNTER — Ambulatory Visit (INDEPENDENT_AMBULATORY_CARE_PROVIDER_SITE_OTHER)
Admission: RE | Admit: 2015-05-07 | Discharge: 2015-05-07 | Disposition: A | Discharge status: Home / Self Care | Payer: BLUE CROSS/BLUE SHIELD | Source: Ambulatory Visit | Attending: Critical Care Medicine | Admitting: Critical Care Medicine

## 2015-05-07 DIAGNOSIS — H2511 Age-related nuclear cataract, right eye: Secondary | ICD-10-CM | POA: Diagnosis not present

## 2015-05-07 DIAGNOSIS — R911 Solitary pulmonary nodule: Secondary | ICD-10-CM

## 2015-05-07 DIAGNOSIS — R918 Other nonspecific abnormal finding of lung field: Secondary | ICD-10-CM | POA: Diagnosis not present

## 2015-05-07 DIAGNOSIS — H2513 Age-related nuclear cataract, bilateral: Secondary | ICD-10-CM | POA: Diagnosis not present

## 2015-05-27 DIAGNOSIS — H2511 Age-related nuclear cataract, right eye: Secondary | ICD-10-CM | POA: Diagnosis not present

## 2015-06-03 DIAGNOSIS — H2512 Age-related nuclear cataract, left eye: Secondary | ICD-10-CM | POA: Diagnosis not present

## 2015-06-03 DIAGNOSIS — H2511 Age-related nuclear cataract, right eye: Secondary | ICD-10-CM | POA: Diagnosis not present

## 2015-06-05 ENCOUNTER — Other Ambulatory Visit: Payer: Self-pay | Admitting: Critical Care Medicine

## 2015-06-10 ENCOUNTER — Other Ambulatory Visit: Payer: Self-pay | Admitting: *Deleted

## 2015-06-10 DIAGNOSIS — H25012 Cortical age-related cataract, left eye: Secondary | ICD-10-CM | POA: Diagnosis not present

## 2015-06-10 MED ORDER — ALBUTEROL SULFATE HFA 108 (90 BASE) MCG/ACT IN AERS: INHALATION_SPRAY | RESPIRATORY_TRACT | Status: DC

## 2015-06-23 DIAGNOSIS — N400 Enlarged prostate without lower urinary tract symptoms: Secondary | ICD-10-CM | POA: Diagnosis not present

## 2015-06-23 DIAGNOSIS — Z1389 Encounter for screening for other disorder: Secondary | ICD-10-CM | POA: Diagnosis not present

## 2015-06-23 DIAGNOSIS — R413 Other amnesia: Secondary | ICD-10-CM | POA: Diagnosis not present

## 2015-06-23 DIAGNOSIS — K219 Gastro-esophageal reflux disease without esophagitis: Secondary | ICD-10-CM | POA: Diagnosis not present

## 2015-06-23 DIAGNOSIS — Z23 Encounter for immunization: Secondary | ICD-10-CM | POA: Diagnosis not present

## 2015-06-23 DIAGNOSIS — R7301 Impaired fasting glucose: Secondary | ICD-10-CM | POA: Diagnosis not present

## 2015-09-16 ENCOUNTER — Encounter: Payer: Self-pay | Admitting: Adult Health

## 2015-09-16 ENCOUNTER — Ambulatory Visit (INDEPENDENT_AMBULATORY_CARE_PROVIDER_SITE_OTHER): Payer: BLUE CROSS/BLUE SHIELD | Admitting: Adult Health

## 2015-09-16 VITALS — BP 132/88 | HR 69 | Temp 97.6°F | Ht 69.0 in | Wt 167.0 lb

## 2015-09-16 DIAGNOSIS — J449 Chronic obstructive pulmonary disease, unspecified: Secondary | ICD-10-CM

## 2015-09-16 DIAGNOSIS — Z23 Encounter for immunization: Secondary | ICD-10-CM | POA: Diagnosis not present

## 2015-09-16 DIAGNOSIS — K219 Gastro-esophageal reflux disease without esophagitis: Secondary | ICD-10-CM

## 2015-09-16 DIAGNOSIS — R911 Solitary pulmonary nodule: Secondary | ICD-10-CM | POA: Diagnosis not present

## 2015-09-16 MED ORDER — ALBUTEROL SULFATE HFA 108 (90 BASE) MCG/ACT IN AERS: INHALATION_SPRAY | RESPIRATORY_TRACT | Status: DC

## 2015-09-16 MED ORDER — TIOTROPIUM BROMIDE MONOHYDRATE 18 MCG IN CAPS: 18.0000 ug | ORAL_CAPSULE | Freq: Every day | RESPIRATORY_TRACT | Status: DC

## 2015-09-16 MED ORDER — FLUTICASONE PROPIONATE 50 MCG/ACT NA SUSP: 2.0000 | Freq: Every day | NASAL | Status: DC

## 2015-09-16 NOTE — Addendum Note (Signed)
Addended by: Osa Craver on: 09/16/2015 04:42 PM   Modules accepted: Orders

## 2015-09-16 NOTE — Progress Notes (Signed)
Subjective:    Patient ID: Chris Lewis, male    DOB: May 25, 1941, 75 y.o.   MRN: [id number]  HPI 75 yo male former smoker with COPD -GOLD C , Right lung nodule and Chronic microaspiration d/t achalasia.    09/16/2015 Follow up : COPD/Lung nodule  Pt returns for 6 month follow up .  Pt says overall his breathing is doing okay.  No flare of cough or wheezing.  Does get post nasal drainage . Uses flonase.  Remains on Spiriva daily .  PVX and Flu utd.  Discussed Prevnar vaccine today, he would like to get this.   Has RUL nodules w/ central component , felt to inflammatory .  Repeat CT chest in 04/2015, noted central area of density resolved, stable scarring in RUL .   Has hx of GERD and achalasia . Says reflux has been worse lately  We discussed he follow up with GI .  Taking Prilosec daily.  No fever, chest pain, orthopnea, bloody stool or vomiting.     Past Medical History  Diagnosis Date  . Achalasia   . Hypercholesterolemia   . Seasonal allergic rhinitis   . Transient ischemic attack   . Mitral valve regurgitation   . Osteopenia   . FH: cholecystectomy   . S/P right inguinal herniorrhaphy   . MVP (mitral valve prolapse)   . MR (mitral regurgitation)   . GERD (gastroesophageal reflux disease)   . Shortness of breath     sometimes at rest  . Stroke ([place]) 10/2002  . Arthritis   . Anxiety     causes shortness  . Hiatal hernia   . Depression   . Chronic obstructive pulmonary disease ([place])   . COPD (chronic obstructive pulmonary disease) ([place])   . Lesion of right lung 06/04/2013  . Heart murmur     hx mitral valve surgery  . Pneumonia 06/04/2013    aspirated food 14   Current Outpatient Prescriptions on File Prior to Visit  Medication Sig Dispense Refill  . albuterol (VENTOLIN HFA) 108 (90 BASE) MCG/ACT inhaler INHALE 2 PUFFS INTO THE LUNGS EVERY 4 (FOUR) HOURS AS NEEDED FOR WHEEZING. 18 Inhaler 0  . calcium carbonate (TUMS EX) 750 MG chewable tablet Chew 1 tablet  by mouth as needed (for gas and reflux).     . clonazePAM (KLONOPIN) 1 MG tablet TAKE 1 TABLET BY MOUTH TWICE A DAY (Patient taking differently: TAKE 1/2 TABLET BY MOUTH TWICE A DAY) 30 tablet 0  . fluticasone (FLONASE) 50 MCG/ACT nasal spray Place 2 sprays into both nostrils daily. 16 g 6  . metroNIDAZOLE (METROGEL) 1 % gel Apply 1 application topically daily as needed (to skin). Apply thin layer to affected area externally    . Multiple Vitamins-Minerals (ICAPS AREDS 2) CAPS Take 2 capsules by mouth daily.    . ondansetron (ZOFRAN) 4 MG tablet Take 4 mg by mouth every 8 (eight) hours as needed for nausea.     Vladimir Faster Glycol-Propyl Glycol (SYSTANE OP) Apply 1 drop to eye 2 (two) times daily.      Marland Kitchen tiotropium (SPIRIVA HANDIHALER) 18 MCG inhalation capsule Place 1 capsule (18 mcg total) into inhaler and inhale daily. 30 capsule 11  . traMADol (ULTRAM) 50 MG tablet Take 50 mg by mouth Every 6 hours as needed for moderate pain.     Marland Kitchen aspirin 81 MG tablet Take 81 mg by mouth daily. Reported on 09/16/2015    . Biotin 1000 MCG tablet Take  1,000 mcg by mouth daily. Reported on 09/16/2015    . calcium carbonate (OS-CAL - DOSED IN MG OF ELEMENTAL CALCIUM) 1250 MG tablet Take 2 tablets by mouth daily with breakfast. Reported on 09/16/2015    . cetirizine (ZYRTEC) 10 MG tablet Take 10 mg by mouth daily as needed for allergies. Reported on 09/16/2015    . Cholecalciferol (VITAMIN D3) 1000 UNITS CAPS Take 1,000 Units by mouth daily. Reported on 09/16/2015     No current facility-administered medications on file prior to visit.      Review of Systems Constitutional:   No  weight loss, night sweats,  Fevers, chills, fatigue, or  lassitude.  HEENT:   No headaches,  Difficulty swallowing,  Tooth/dental problems, or  Sore throat,                No sneezing, itching, ear ache, nasal congestion, post nasal drip,   CV:  No chest pain,  Orthopnea, PND, swelling in lower extremities, anasarca, dizziness,  palpitations, syncope.   GI  No  abdominal pain, nausea, vomiting, diarrhea, change in bowel habits, loss of appetite, bloody stools.   Resp: .  No chest wall deformity  Skin: no rash or lesions.  GU: no dysuria, change in color of urine, no urgency or frequency.  No flank pain, no hematuria   MS:  No joint pain or swelling.  No decreased range of motion.  No back pain.  Psych:  No change in mood or affect. No depression or anxiety.  No memory loss.           Objective:   Physical Exam  Filed Vitals:   09/16/15 1436  BP: 132/88  Pulse: 69  Temp: 97.6 F (36.4 C)  TempSrc: Oral  Height: 5\' 9"  (1.753 m)  Weight: 167 lb (75.751 kg)  SpO2: 96%   GEN: A/Ox3; pleasant , NAD, elderly   HEENT:  [place]/AT,  EACs-clear, TMs-wnl, NOSE-clear, THROAT-clear, no lesions, no postnasal drip or exudate noted.   NECK:  Supple w/ fair ROM; no JVD; normal carotid impulses w/o bruits; no thyromegaly or nodules palpated; no lymphadenopathy.  RESP  Decreased BS in bases , .no accessory muscle use, no dullness to percussion  CARD:  RRR, no m/r/g  , no peripheral edema, pulses intact, no cyanosis or clubbing.  GI:   Soft & nt; nml bowel sounds; no organomegaly or masses detected.  Musco: Warm bil, no deformities or joint swelling noted.   Neuro: alert, no focal deficits noted.    Skin: Warm, no lesions or rashes   CT chest 04/2015  Improved CT appearance of the dense area of right upper lobe scarring change. The more central area of density has resolved. 2. Stable right upper lobe pulmonary nodules and right lower lobe calcified granuloma. 3. Stable advanced emphysematous changes and pulmonary scarring. No acute pulmonary findings.     Assessment & Plan:

## 2015-09-16 NOTE — Patient Instructions (Addendum)
Continue on Spiriva daily  Prevnar vaccine today.  Add Zantac 150mg  At bedtime   GERD diet  Follow up Dr. Ashok Cordia in 6 months and As needed

## 2015-09-16 NOTE — Assessment & Plan Note (Signed)
Serial CT chest 04/2015 with resolution of solid componeent with residual scarring noted.

## 2015-09-16 NOTE — Addendum Note (Signed)
Addended by: Osa Craver on: 09/16/2015 03:28 PM   Modules accepted: Orders

## 2015-09-16 NOTE — Assessment & Plan Note (Addendum)
Controlled on [place] on Spiriva daily  Prevnar vaccine today.  Add Zantac 150mg  At bedtime   GERD diet  Follow up Dr. Ashok Cordia in 6 months and As needed

## 2015-09-16 NOTE — Assessment & Plan Note (Signed)
Not optimally controlled  Plan  Follow up with GI MD  Add Zantac 150mg  At bedtime   GERD diet  Follow up Dr. Ashok Cordia in 6 months and As needed

## 2015-10-03 ENCOUNTER — Telehealth: Payer: Self-pay | Admitting: Adult Health

## 2015-10-03 NOTE — Telephone Encounter (Signed)
lmtcb x1 for pt. 

## 2015-10-06 MED ORDER — ALBUTEROL SULFATE HFA 108 (90 BASE) MCG/ACT IN AERS: INHALATION_SPRAY | RESPIRATORY_TRACT | Status: DC

## 2015-10-06 NOTE — Telephone Encounter (Signed)
Called spoke with pt. He needs refill on his ventolin. I have done so. Nothing further needed

## 2015-10-23 DIAGNOSIS — G4700 Insomnia, unspecified: Secondary | ICD-10-CM | POA: Diagnosis not present

## 2015-10-23 DIAGNOSIS — J069 Acute upper respiratory infection, unspecified: Secondary | ICD-10-CM | POA: Diagnosis not present

## 2015-10-27 ENCOUNTER — Ambulatory Visit (INDEPENDENT_AMBULATORY_CARE_PROVIDER_SITE_OTHER): Payer: BLUE CROSS/BLUE SHIELD | Admitting: Pulmonary Disease

## 2015-10-27 ENCOUNTER — Encounter (HOSPITAL_COMMUNITY): Payer: Self-pay | Admitting: Emergency Medicine

## 2015-10-27 ENCOUNTER — Encounter: Payer: Self-pay | Admitting: Pulmonary Disease

## 2015-10-27 ENCOUNTER — Emergency Department (HOSPITAL_COMMUNITY)
Admission: EM | Admit: 2015-10-27 | Discharge: 2015-10-28 | Disposition: A | Discharge status: Home / Self Care | Payer: BLUE CROSS/BLUE SHIELD | Attending: Emergency Medicine | Admitting: Emergency Medicine

## 2015-10-27 VITALS — BP 132/78 | HR 70 | Ht 69.0 in | Wt 165.2 lb

## 2015-10-27 DIAGNOSIS — Z7951 Long term (current) use of inhaled steroids: Secondary | ICD-10-CM | POA: Diagnosis not present

## 2015-10-27 DIAGNOSIS — J3089 Other allergic rhinitis: Secondary | ICD-10-CM | POA: Diagnosis not present

## 2015-10-27 DIAGNOSIS — K219 Gastro-esophageal reflux disease without esophagitis: Secondary | ICD-10-CM | POA: Diagnosis not present

## 2015-10-27 DIAGNOSIS — R05 Cough: Secondary | ICD-10-CM

## 2015-10-27 DIAGNOSIS — F419 Anxiety disorder, unspecified: Secondary | ICD-10-CM | POA: Diagnosis not present

## 2015-10-27 DIAGNOSIS — Z8679 Personal history of other diseases of the circulatory system: Secondary | ICD-10-CM | POA: Diagnosis not present

## 2015-10-27 DIAGNOSIS — E875 Hyperkalemia: Secondary | ICD-10-CM

## 2015-10-27 DIAGNOSIS — F329 Major depressive disorder, single episode, unspecified: Secondary | ICD-10-CM | POA: Diagnosis not present

## 2015-10-27 DIAGNOSIS — Z79899 Other long term (current) drug therapy: Secondary | ICD-10-CM | POA: Diagnosis not present

## 2015-10-27 DIAGNOSIS — M8580 Other specified disorders of bone density and structure, unspecified site: Secondary | ICD-10-CM | POA: Diagnosis not present

## 2015-10-27 DIAGNOSIS — R45851 Suicidal ideations: Secondary | ICD-10-CM

## 2015-10-27 DIAGNOSIS — R059 Cough, unspecified: Secondary | ICD-10-CM

## 2015-10-27 DIAGNOSIS — J449 Chronic obstructive pulmonary disease, unspecified: Secondary | ICD-10-CM | POA: Insufficient documentation

## 2015-10-27 DIAGNOSIS — Z87891 Personal history of nicotine dependence: Secondary | ICD-10-CM | POA: Diagnosis not present

## 2015-10-27 DIAGNOSIS — F32A Depression, unspecified: Secondary | ICD-10-CM

## 2015-10-27 DIAGNOSIS — Z8701 Personal history of pneumonia (recurrent): Secondary | ICD-10-CM | POA: Diagnosis not present

## 2015-10-27 DIAGNOSIS — Z8639 Personal history of other endocrine, nutritional and metabolic disease: Secondary | ICD-10-CM | POA: Diagnosis not present

## 2015-10-27 DIAGNOSIS — R011 Cardiac murmur, unspecified: Secondary | ICD-10-CM | POA: Insufficient documentation

## 2015-10-27 DIAGNOSIS — Z8673 Personal history of transient ischemic attack (TIA), and cerebral infarction without residual deficits: Secondary | ICD-10-CM | POA: Insufficient documentation

## 2015-10-27 DIAGNOSIS — Z7952 Long term (current) use of systemic steroids: Secondary | ICD-10-CM | POA: Insufficient documentation

## 2015-10-27 DIAGNOSIS — Z008 Encounter for other general examination: Secondary | ICD-10-CM | POA: Diagnosis present

## 2015-10-27 DIAGNOSIS — M1990 Unspecified osteoarthritis, unspecified site: Secondary | ICD-10-CM | POA: Insufficient documentation

## 2015-10-27 LAB — BASIC METABOLIC PANEL
ANION GAP: 10 (ref 5–15)
BUN: 21 mg/dL — AB (ref 6–20)
CHLORIDE: 105 mmol/L (ref 101–111)
CO2: 28 mmol/L (ref 22–32)
Calcium: 10.3 mg/dL (ref 8.9–10.3)
Creatinine, Ser: 1.18 mg/dL (ref 0.61–1.24)
GFR calc Af Amer: >60 mL/min (ref >60)
GFR calc non Af Amer: 58 mL/min — ABNORMAL LOW (ref >60)
GLUCOSE: 132 mg/dL — AB (ref 65–99)
POTASSIUM: 5.7 mmol/L — AB (ref 3.5–5.1)
Sodium: 143 mmol/L (ref 135–145)

## 2015-10-27 LAB — CBC WITH DIFFERENTIAL/PLATELET
Basophils Absolute: 0 10*3/uL (ref 0.0–0.1)
Basophils Relative: 0 %
EOS PCT: 0 %
Eosinophils Absolute: 0 10*3/uL (ref 0.0–0.7)
HEMATOCRIT: 48.7 % (ref 39.0–52.0)
HEMOGLOBIN: 15.9 g/dL (ref 13.0–17.0)
LYMPHS ABS: 1.2 10*3/uL (ref 0.7–4.0)
LYMPHS PCT: 8 %
MCH: 30.6 pg (ref 26.0–34.0)
MCHC: 32.6 g/dL (ref 30.0–36.0)
MCV: 93.7 fL (ref 78.0–100.0)
Monocytes Absolute: 0.5 10*3/uL (ref 0.1–1.0)
Monocytes Relative: 4 %
NEUTROS ABS: 12.6 10*3/uL — AB (ref 1.7–7.7)
Neutrophils Relative %: 88 %
Platelets: 200 10*3/uL (ref 150–400)
RBC: 5.2 MIL/uL (ref 4.22–5.81)
RDW: 13.3 % (ref 11.5–15.5)
WBC: 14.4 10*3/uL — AB (ref 4.0–10.5)

## 2015-10-27 LAB — ETHANOL: Alcohol, Ethyl (B): <5 mg/dL (ref <5)

## 2015-10-27 MED ORDER — NICOTINE 21 MG/24HR TD PT24: 21.0000 mg | MEDICATED_PATCH | Freq: Every day | TRANSDERMAL | Status: DC

## 2015-10-27 MED ORDER — BENZONATATE 100 MG PO CAPS: 100.0000 mg | ORAL_CAPSULE | Freq: Three times a day (TID) | ORAL | Status: DC | PRN

## 2015-10-27 MED ORDER — LORAZEPAM 1 MG PO TABS: 1.0000 mg | ORAL_TABLET | Freq: Three times a day (TID) | ORAL | Status: DC | PRN

## 2015-10-27 MED ORDER — ZOLPIDEM TARTRATE 5 MG PO TABS
5.0000 mg | ORAL_TABLET | Freq: Every evening | ORAL | Status: DC | PRN
  Administered 2015-10-28: 5 mg via ORAL
  Filled 2015-10-27: qty 1

## 2015-10-27 MED ORDER — ESOMEPRAZOLE MAGNESIUM 40 MG PO CPDR: 40.0000 mg | DELAYED_RELEASE_CAPSULE | Freq: Every morning | ORAL | Status: DC

## 2015-10-27 MED ORDER — DOXYCYCLINE HYCLATE 100 MG PO TABS: 100.0000 mg | ORAL_TABLET | Freq: Two times a day (BID) | ORAL | Status: DC

## 2015-10-27 MED ORDER — IBUPROFEN 200 MG PO TABS: 600.0000 mg | ORAL_TABLET | Freq: Three times a day (TID) | ORAL | Status: DC | PRN

## 2015-10-27 MED ORDER — ALUM & MAG HYDROXIDE-SIMETH 200-200-20 MG/5ML PO SUSP: 30.0000 mL | ORAL | Status: DC | PRN

## 2015-10-27 MED ORDER — ONDANSETRON HCL 4 MG PO TABS: 4.0000 mg | ORAL_TABLET | Freq: Three times a day (TID) | ORAL | Status: DC | PRN

## 2015-10-27 NOTE — ED Provider Notes (Signed)
CSN: [id number]     Arrival date & time 10/27/15  1629 History   First MD Initiated Contact with Patient 10/27/15 1738     Chief Complaint  Patient presents with  . Medical Clearance  . Suicidal     (Consider location/radiation/quality/duration/timing/severity/associated sxs/prior Treatment) HPI...Marland KitchenMarland KitchenPatient seen by primary care doctor earlier today and was instructed to come the ED. Patient has been depressed for several weeks including feelings of sadness and death. He feels useless. He has been taking Klonopin but this has not helped. He has had some suicidal ideation. Potassium noted to be elevated in August 2015 at 5.5.  Past Medical History  Diagnosis Date  . Achalasia   . Hypercholesterolemia   . Seasonal allergic rhinitis   . Transient ischemic attack   . Mitral valve regurgitation   . Osteopenia   . FH: cholecystectomy   . S/P right inguinal herniorrhaphy   . MVP (mitral valve prolapse)   . MR (mitral regurgitation)   . GERD (gastroesophageal reflux disease)   . Shortness of breath     sometimes at rest  . Stroke ([place]) 10/2002  . Arthritis   . Anxiety     causes shortness  . Hiatal hernia   . Depression   . Chronic obstructive pulmonary disease ([place])   . COPD (chronic obstructive pulmonary disease) ([place])   . Lesion of right lung 06/04/2013  . Heart murmur     hx mitral valve surgery  . Pneumonia 06/04/2013    aspirated food 14   Past Surgical History  Procedure Laterality Date  . Back surgery      x 3  . Foot surgery Left     screw -fx  . Esophagogastroduodenoscopy and savary esophageal dilation.      x9  . Eyebrow surgery  2012  . Rt inguinal herniorrhaphy  1969/1997  . Esophagoscopy w/ botox injection      x9 beginning 2008, most recent Sept. 2012  . Tee without cardioversion  07/06/2011    Procedure: TRANSESOPHAGEAL ECHOCARDIOGRAM (TEE);  Surgeon: Sueanne Margarita, MD;  Location: [facility] ENDOSCOPY;  Service: Cardiovascular;  Laterality: N/A;  .  Cholecystectomy    . Mitral valve repair  08/03/2011    Procedure: MINIMALLY INVASIVE MITRAL VALVE REPAIR (MVR);  Surgeon: Rexene Alberts, MD;  Location: [place];  Service: Open Heart Surgery;  Laterality: Right;  minimally invasive MVR  . Esophagogastroduodenoscopy  01/13/2012    Procedure: ESOPHAGOGASTRODUODENOSCOPY (EGD);  Surgeon: Garlan Fair, MD;  Location: [facility];  Service: Endoscopy;  Laterality: N/A;  botox /katrina  . Esophagogastroduodenoscopy  06/27/2012    Procedure: ESOPHAGOGASTRODUODENOSCOPY (EGD);  Surgeon: Garlan Fair, MD;  Location: [facility] ENDOS[facility];  Service: Endoscopy;  Laterality: N/A;  . Botox injection  06/27/2012    Procedure: BOTOX INJECTION;  Surgeon: Garlan Fair, MD;  Location: [facility] ENDOSCOPY;  Service: Endoscopy;  Laterality: N/A;  . Esophagogastroduodenoscopy (egd) with esophageal dilation N/A 11/22/2012    Procedure: ESOPHAGOGASTRODUODENOSCOPY (EGD) WITH BOTOX INJECTION;  Surgeon: Garlan Fair, MD;  Location: [facility] ENDOSCOPY;  Service: Endoscopy;  Laterality: N/A;  . Laparoscopic heller myotomy  July 2014    [facility]  . Video bronchoscopy with endobronchial navigation N/A 04/03/2014    Procedure: VIDEO BRONCHOSCOPY WITH ENDOBRONCHIAL NAVIGATION;  Surgeon: Collene Gobble, MD;  Location: [facility] OR;  Service: Thoracic;  Laterality: N/A;   Family History  Problem Relation Age of Onset  . Stroke Father 65    cva  . Scleroderma Mother 93  .  Lung cancer Sister   . Cancer Sister   . Heart disease Sister   . Anesthesia problems Neg Hx    Social History  Substance Use Topics  . Smoking status: Former Smoker -- 1.50 packs/day for 38 years    Types: Cigarettes    Quit date: 08/29/1997  . Smokeless tobacco: Never Used  . Alcohol Use: No    Review of Systems  All other systems reviewed and are negative.     Allergies  Duac; Contrast media; Atorvastatin; Propoxyphene n-acetaminophen; Rosuvastatin; Metrizamide; and Other  Home Medications   Prior to  Admission medications   Medication Sig Start Date End Date Taking? Authorizing Provider  albuterol (VENTOLIN HFA) 108 (90 Base) MCG/ACT inhaler INHALE 2 PUFFS INTO THE LUNGS EVERY 4 (FOUR) HOURS AS NEEDED FOR WHEEZING. 10/06/15  Yes Tammy S Parrett, NP  amoxicillin (AMOXIL) 500 MG capsule Take 2,000 mg by mouth as needed (prior to dental appointments.).   Yes Historical Provider, MD  calcium carbonate (OS-CAL - DOSED IN MG OF ELEMENTAL CALCIUM) 1250 MG tablet Take 2 tablets by mouth daily with breakfast. Reported on 09/16/2015   Yes Historical Provider, MD  calcium carbonate (TUMS EX) 750 MG chewable tablet Chew 1 tablet by mouth as needed (for gas and reflux).    Yes Historical Provider, MD  cetirizine (ZYRTEC) 10 MG tablet Take 10 mg by mouth daily as needed for allergies. Reported on 10/27/2015   Yes Historical Provider, MD  Cholecalciferol (VITAMIN D3) 1000 UNITS CAPS Take 1,000 Units by mouth daily. Reported on 09/16/2015   Yes Historical Provider, MD  clonazePAM (KLONOPIN) 1 MG tablet TAKE 1 TABLET BY MOUTH TWICE A DAY Patient taking differently: Take 1 mg by mouth at bedtime. 04/22/15  Yes Elsie Stain, MD  fluticasone (FLONASE) 50 MCG/ACT nasal spray Place 2 sprays into both nostrils daily. 09/16/15  Yes Tammy S Parrett, NP  metroNIDAZOLE (METROGEL) 1 % gel Apply 1 application topically daily as needed (to skin). Apply thin layer to affected area externally   Yes Historical Provider, MD  Multiple Vitamins-Minerals (ICAPS AREDS 2) CAPS Take 1 capsule by mouth 2 (two) times daily.    Yes Historical Provider, MD  pantoprazole (PROTONIX) 40 MG tablet Take 40 mg by mouth daily.  10/12/15  Yes Historical Provider, MD  Polyethyl Glycol-Propyl Glycol (SYSTANE OP) Apply 1 drop to eye 2 (two) times daily.     Yes Historical Provider, MD  tiotropium (SPIRIVA HANDIHALER) 18 MCG inhalation capsule Place 1 capsule (18 mcg total) into inhaler and inhale daily. 09/16/15  Yes Tammy S Parrett, NP  traMADol (ULTRAM)  50 MG tablet Take 50 mg by mouth Every 6 hours as needed for moderate pain.  03/01/12  Yes Historical Provider, MD  benzonatate (TESSALON PERLES) 100 MG capsule Take 1-2 capsules (100-200 mg total) by mouth 3 (three) times daily as needed for cough. 10/27/15   Javier Glazier, MD  doxycycline (VIBRA-TABS) 100 MG tablet Take 1 tablet (100 mg total) by mouth 2 (two) times daily. 10/27/15   Javier Glazier, MD  esomeprazole (NEXIUM) 40 MG capsule Take 1 capsule (40 mg total) by mouth every morning. 10/27/15   Javier Glazier, MD  predniSONE (DELTASONE) 20 MG tablet Take 20 mg by mouth 2 (two) times daily with a meal.  10/23/15   Historical Provider, MD   BP 142/87 mmHg  Pulse 64  Temp(Src) 97.5 F (36.4 C) (Oral)  Resp 18  SpO2 99% Physical Exam  Constitutional: He  is oriented to person, place, and time. He appears well-developed and well-nourished.  HENT:  Head: Normocephalic and atraumatic.  Eyes: Conjunctivae and EOM are normal. Pupils are equal, round, and reactive to light.  Neck: Normal range of motion. Neck supple.  Cardiovascular: Normal rate and regular rhythm.   Pulmonary/Chest: Effort normal and breath sounds normal.  Abdominal: Soft. Bowel sounds are normal.  Musculoskeletal: Normal range of motion.  Neurological: He is alert and oriented to person, place, and time.  Skin: Skin is warm and dry.  Psychiatric:  Flat affect, depressed  Nursing note and vitals reviewed.   ED Course  Procedures (including critical care time) Labs Review Labs Reviewed  CBC WITH DIFFERENTIAL/PLATELET - Abnormal; Notable for the following:    WBC 14.4 (*)    Neutro Abs 12.6 (*)    All other components within normal limits  BASIC METABOLIC PANEL - Abnormal; Notable for the following:    Potassium 5.7 (*)    Glucose, Bld 132 (*)    BUN 21 (*)    GFR calc non Af Amer 58 (*)    All other components within normal limits  ETHANOL  URINE RAPID DRUG SCREEN, HOSP PERFORMED  I-STAT CHEM 8, ED     Imaging Review No results found. I have personally reviewed and evaluated these images and lab results as part of my medical decision-making.   EKG Interpretation None      MDM   Final diagnoses:  Depression  Hyperkalemia    Patient will be evaluated by behavioral health for depression and suicidal ideation. Potassium is elevated at 5.7. Old chart reviewed and patient noted to have a potassium of 5.5 in August 2015.  Patient was not aware of this.    Nat Christen, MD 10/27/15 [phone]

## 2015-10-27 NOTE — ED Notes (Signed)
Pt was seen at his PCP earlier today and was told to come get a psych evaluation here. Pt sts that over the past month or so he has "started to have feelings" about death. Pt sts in his old age he has lost a lot of classmates and friends because they are all getting older. Pt has also had multiple back surgeries and sts "I feel totally useless." Pt sts he isn't allowed to lift over 5 lbs and his wife has to things for him that make him look like a "sorry son of a bitch."  Pt also was told he needed to sell his jeep and that he couldn't drive his Radiographer, therapeutic. Pt has been on Klonopin for awhile and had dose changed this past week. Pt sts "My wife says I was acting looney until I was put on this medicine and she thinks it's getting worse." Pt would like to be taken off klonopin. Pt sts that the people around him tell him he has become hateful and pt sts "I used to be a happy person."  Pt A&Ox4 and ambulatory. When asked if patient has a plan to kill himself pt makes a gun with his fingers, places his fingers to the roof of his mouth and sts "I've heard that blowing the top of your head off is the best way." When asked what patient likes to do pt sts "I love to go shooting." Pt has access to guns.

## 2015-10-27 NOTE — BH Assessment (Addendum)
Tele Assessment Note   Chris Lewis is an 75 y.o. male.  -Clinician reviewed note by Vonna Kotyk, RN.  She noted when asked if patient has a plan to kill himself pt makes a gun with his fingers, places his fingers to the roof of his mouth and sts "I've heard that blowing the top of your head off is the best way." When asked what patient likes to do pt sts "I love to go shooting." Pt has access to guns,  Pt has been having some thoughts of depression and has been having passing thoughts of suicide.  Patient says that he is depressed about daughter saying that he was mean and uncaring.  Daughter recently moved into the home with him and wife.  Patient says she had said these things to him and it made him upset.  He is also depressed about the fact that many of his friends have died recently.  Patient says he has no intention to kill himself.  Patient says that he was asked about what he would do to kill self and he admits to saying he put his fingers to the roof of mouth.  He denies any real intention or current plan to harm self.  Patient says he has been on clonopin (half a miligram) for a long time.  He talked to Dr. Laurann Montana at [facility] about the medications.  Patient says that Dr. Laurann Montana increased it to 1mg  last Thursday after patient had mentioned becoming hard to get to sleep.  Pt does say he wants to get off clonopin.  He is worried about side effects of the medication.  Patient is pleasant and likes to talk about people and places.  He is focused and goal directed in speech.  He laughs and appears to enjoy talking.  Patient says he feels like he is safe to return home.  -Clinician discussed patient care with Patriciaann Clan, PA.  He recommended that patient be seen in the AM by psychiatry.  Clinician discussed disposition with Dr. Nat Christen.  He was in agreement with patient staying in [place] overnight then being seen by psychiatry.    Diagnosis: MDD single episode  Past Medical  History:  Past Medical History  Diagnosis Date  . Achalasia   . Hypercholesterolemia   . Seasonal allergic rhinitis   . Transient ischemic attack   . Mitral valve regurgitation   . Osteopenia   . FH: cholecystectomy   . S/P right inguinal herniorrhaphy   . MVP (mitral valve prolapse)   . MR (mitral regurgitation)   . GERD (gastroesophageal reflux disease)   . Shortness of breath     sometimes at rest  . Stroke ([place]) 10/2002  . Arthritis   . Anxiety     causes shortness  . Hiatal hernia   . Depression   . Chronic obstructive pulmonary disease ([place])   . COPD (chronic obstructive pulmonary disease) ([place])   . Lesion of right lung 06/04/2013  . Heart murmur     hx mitral valve surgery  . Pneumonia 06/04/2013    aspirated food 14    Past Surgical History  Procedure Laterality Date  . Back surgery      x 3  . Foot surgery Left     screw -fx  . Esophagogastroduodenoscopy and savary esophageal dilation.      x9  . Eyebrow surgery  2012  . Rt inguinal herniorrhaphy  1969/1997  . Esophagoscopy w/ botox injection  x9 beginning 2008, most recent Sept. 2012  . Tee without cardioversion  07/06/2011    Procedure: TRANSESOPHAGEAL ECHOCARDIOGRAM (TEE);  Surgeon: Sueanne Margarita, MD;  Location: [facility] ENDOSCOPY;  Service: Cardiovascular;  Laterality: N/A;  . Cholecystectomy    . Mitral valve repair  08/03/2011    Procedure: MINIMALLY INVASIVE MITRAL VALVE REPAIR (MVR);  Surgeon: Rexene Alberts, MD;  Location: [place];  Service: Open Heart Surgery;  Laterality: Right;  minimally invasive MVR  . Esophagogastroduodenoscopy  01/13/2012    Procedure: ESOPHAGOGASTRODUODENOSCOPY (EGD);  Surgeon: Garlan Fair, MD;  Location: [facility] ENDOSCOPY;  Service: Endoscopy;  Laterality: N/A;  botox /katrina  . Esophagogastroduodenoscopy  06/27/2012    Procedure: ESOPHAGOGASTRODUODENOSCOPY (EGD);  Surgeon: Garlan Fair, MD;  Location: [facility] ENDOSCOPY;  Service: Endoscopy;  Laterality: N/A;  . Botox  injection  06/27/2012    Procedure: BOTOX INJECTION;  Surgeon: Garlan Fair, MD;  Location: [facility] ENDOSCOPY;  Service: Endoscopy;  Laterality: N/A;  . Esophagogastroduodenoscopy (egd) with esophageal dilation N/A 11/22/2012    Procedure: ESOPHAGOGASTRODUODENOSCOPY (EGD) WITH BOTOX INJECTION;  Surgeon: Garlan Fair, MD;  Location: [facility] ENDOSCOPY;  Service: Endoscopy;  Laterality: N/A;  . Laparoscopic heller myotomy  July 2014    [facility]  . Video bronchoscopy with endobronchial navigation N/A 04/03/2014    Procedure: VIDEO BRONCHOSCOPY WITH ENDOBRONCHIAL NAVIGATION;  Surgeon: Collene Gobble, MD;  Location: [facility] OR;  Service: Thoracic;  Laterality: N/A;    Family History:  Family History  Problem Relation Age of Onset  . Stroke Father 40    cva  . Scleroderma Mother 10  . Lung cancer Sister   . Cancer Sister   . Heart disease Sister   . Anesthesia problems Neg Hx     Social History:  reports that he quit smoking about 18 years ago. His smoking use included Cigarettes. He has a 57 pack-year smoking history. He has never used smokeless tobacco. He reports that he does not drink alcohol or use illicit drugs.  Additional Social History:  Alcohol / Drug Use Pain Medications: See PTA medication Prescriptions: See PTA medication list Over the Counter: See PTA medication list History of alcohol / drug use?: No history of alcohol / drug abuse Longest period of sobriety (when/how long): Stopped drinking in 1994.  CIWA: CIWA-Ar BP: 130/83 mmHg Pulse Rate: 106 COWS:    PATIENT STRENGTHS: (choose at least two) Ability for insight Average or above average intelligence Capable of independent living Communication skills Supportive family/friends  Allergies:  Allergies  Allergen Reactions  . Duac [Clindamycin Phos-Benzoyl Perox] Rash  . Contrast Media [Iodinated Diagnostic Agents] Rash    Heavy rash and itching 20 yrs ago. Needs full premeds and does well with this.  . Atorvastatin      muscle pain  . Propoxyphene N-Acetaminophen Itching    rash  . Rosuvastatin     muscle weakness  . Metrizamide Rash    radiopaque contrast agent. Heavy rash and itching 20 yrs ago. Needs full premeds and does well with this.  . Other Rash    High acid food--tomatoes, orange juice    Home Medications:  (Not in a hospital admission)  OB/GYN Status:  No LMP for male patient.  General Assessment Data Location of Assessment: [facility] ED TTS Assessment: In system Is this a Tele or Face-to-Face Assessment?: Face-to-Face Is this an Initial Assessment or a Re-assessment for this encounter?: Initial Assessment Marital status: Married (Married 16 years.) Is patient pregnant?: No Pregnancy Status: No  Living Arrangements: Spouse/significant other (Lives with wife, daughter lives with them.) Can pt return to current living arrangement?: Yes Admission Status: Voluntary Is patient capable of signing voluntary admission?: Yes Referral Source: Self/Family/Friend Insurance type: BC/BS     Crisis Care Plan Living Arrangements: Spouse/significant other (Lives with wife, daughter lives with them.) Name of Psychiatrist: None Name of Therapist: None  Education Status Is patient currently in school?: No Highest grade of school patient has completed: 12 grade  Risk to self with the past 6 months Suicidal Ideation: No-Not Currently/Within Last 6 Months Has patient been a risk to self within the past 6 months prior to admission? : No Suicidal Intent: No Has patient had any suicidal intent within the past 6 months prior to admission? : No Is patient at risk for suicide?: No Suicidal Plan?: No Has patient had any suicidal plan within the past 6 months prior to admission? : No Access to Means: Yes Specify Access to Suicidal Means: Has guns in the home. What has been your use of drugs/alcohol within the last 12 months?: None Previous Attempts/Gestures: No How many times?: 0 Other Self Harm Risks:  None Triggers for Past Attempts: None known Intentional Self Injurious Behavior: None Family Suicide History: No Recent stressful life event(s): Turmoil (Comment) (Daughter moved in w/ patient and wife recently) Persecutory voices/beliefs?: No Depression: Yes Depression Symptoms: Feeling worthless/self pity, Insomnia Substance abuse history and/or treatment for substance abuse?: Yes Suicide prevention information given to non-admitted patients: Not applicable  Risk to Others within the past 6 months Homicidal Ideation: No Does patient have any lifetime risk of violence toward others beyond the six months prior to admission? : No Thoughts of Harm to Others: No Current Homicidal Intent: No Current Homicidal Plan: No Access to Homicidal Means: No Identified Victim: No one History of harm to others?: No Assessment of Violence: None Noted Violent Behavior Description: None noted Does patient have access to weapons?: Yes (Comment) (Pt says he has guns.) Criminal Charges Pending?: No Does patient have a court date: No Is patient on probation?: No  Psychosis Hallucinations: None noted Delusions: None noted  Mental Status Report Appearance/Hygiene: Meticulous, In scrubs Eye Contact: Good Motor Activity: Freedom of movement, Unremarkable Speech: Logical/coherent Level of Consciousness: Alert Mood: Pleasant, Depressed Affect: Inconsistent with thought content Anxiety Level: None Thought Processes: Coherent, Relevant Judgement: Unimpaired Orientation: Appropriate for developmental age, Person, Place, Time, Situation Obsessive Compulsive Thoughts/Behaviors: None  Cognitive Functioning Concentration: Good Memory: Recent Intact, Remote Intact IQ: Average Insight: Good Impulse Control: Good Appetite: Good Weight Loss: 0 Weight Gain: 0 Sleep: Decreased Total Hours of Sleep:  (Pt is up and down at night.) Vegetative Symptoms: None  ADLScreening [facility] Assessment  Services) Patient's cognitive ability adequate to safely complete daily activities?: Yes Patient able to express need for assistance with ADLs?: Yes Independently performs ADLs?: Yes (appropriate for developmental age)  Prior Inpatient Therapy Prior Inpatient Therapy: No Prior Therapy Dates: None Prior Therapy Facilty/Provider(s): None Reason for Treatment: None  Prior Outpatient Therapy Prior Outpatient Therapy: No Prior Therapy Dates: None Prior Therapy Facilty/Provider(s): N/A Reason for Treatment: N/A Does patient have an ACCT team?: No Does patient have Intensive In-House Services?  : No Does patient have [facility] services? : No Does patient have P4CC services?: No  ADL Screening (condition at time of admission) Patient's cognitive ability adequate to safely complete daily activities?: Yes Is the patient deaf or have difficulty hearing?: No Does the patient have difficulty seeing, even when wearing glasses/contacts?: No (Pt  wears glasses.) Does the patient have difficulty concentrating, remembering, or making decisions?: No Patient able to express need for assistance with ADLs?: Yes Does the patient have difficulty dressing or bathing?: No Independently performs ADLs?: Yes (appropriate for developmental age) Does the patient have difficulty walking or climbing stairs?: Yes (Takes stairs slowly.) Weakness of Legs: Both Weakness of Arms/Hands: None  Home Assistive Devices/Equipment Home Assistive Devices/Equipment: None    Abuse/Neglect Assessment (Assessment to be complete while patient is alone) Physical Abuse: Denies Verbal Abuse: Denies Sexual Abuse: Denies Exploitation of patient/patient's resources: Denies Self-Neglect: Denies     Regulatory affairs officer (For Healthcare) Does patient have an advance directive?: Yes Would patient like information on creating an advanced directive?: No - patient declined information Type of Advance Directive: Living will, Healthcare  Power of Attorney Does patient want to make changes to advanced directive?: No - Patient declined Copy of advanced directive(s) in chart?: No - copy requested    Additional Information 1:1 In Past 12 Months?: No CIRT Risk: No Elopement Risk: No Does patient have medical clearance?: Yes     Disposition:  Disposition Initial Assessment Completed for this Encounter: Yes Disposition of Patient: Other dispositions Other disposition(s): Other (Comment) (Pt to be run by PA)  Raymondo Band 10/27/2015 10:33 PM

## 2015-10-27 NOTE — Addendum Note (Signed)
Addended by: Inge Rise on: 10/27/2015 04:15 PM   Modules accepted: Orders

## 2015-10-27 NOTE — Patient Instructions (Addendum)
   Keep taking your Zantac as prescribed.  I am starting you on Nexium to take in the morning 30 minutes before you eat breakfast.   Keep taking your inhalers as prescribed.  I'm starting you on Doxycycline to help with any infection you might have. Don't take the medication with any dairy products. Remain upright for 1 hour after your take the antibiotic.   Avoid eating or drinking any significant liquids within 3 hours of bedtime.  Try elevating the head of your bed by 3-5 inches by putting  A couple of 2x4 under the front posts  Restart taking your Claritin daily.   I will see you back in 2-3 months or sooner if needed.  TESTS ORDERED: 1. Sputum Culture for AFB, Fungal, and Bacterial cultures 2. Full Pulmonary function testing at next appointment 3. CXR PA/LAT today

## 2015-10-27 NOTE — Progress Notes (Signed)
Subjective:    Patient ID: Chris Lewis, male    DOB: 07-10-1941, 75 y.o.   MRN: [id number]  C.C.:  Acute visit for Cough with known Moderate COPD, Allergic Rhinitis, & GERD.  HPI  Cough:  Reports a sick contact through his wife who also had a cough & respiratory illness. He reports he developed a cough approximately 2 weeks. He reports initially he had a mild sinus congestion & drainage as well as a sore throat. He reports now he is producing a "yellow-tan" mucus. He was given a course of Prednisone recently but this seems to have made his cough worse.   Moderate COPD:  Reports some mild wheezing. Does report some mild dyspnea on exertion. Compliant with Spiriva. Rarely uses his rescue inhaler.   Allergic Rhinitis:  He is using Flonase regularly. Hasn't been using his Zyrtec. Reports some mild sinus congestion & drainage even lately.   GERD:  H/O Achalasia. Follows with [facility]. He was started on Zantac at last appointment with [facility]. He does does wake up with reflux in the middle of the night. He is still waking up with reflux. Does have morning brash water taste. He reports the only thing that helps is to sit nearly straight up. Does have some epigastric pain.   Review of Systems He denies any subjective fever or sweats. He has had hot & cold chills off & on last night. He does have a "pulling" sensation in his right anterior chest. He denies any other chest pain or pressure. He reports he has been thinking about dieing more and does feel useless at times. Has thought about committing suicide by shooting himself in the head with a gun. He does have a gun at home. He reports what keeps him from committing suicide is his religion and knowing that he won't go to heaven.   Allergies  Allergen Reactions  . Duac [Clindamycin Phos-Benzoyl Perox] Rash  . Contrast Media [Iodinated Diagnostic Agents] Rash    Heavy rash and itching 20 yrs ago. Needs full premeds and does well with this.  .  Atorvastatin     muscle pain  . Propoxyphene N-Acetaminophen Itching    rash  . Rosuvastatin     muscle weakness  . Hydrocodone Rash    rash   . Metrizamide Rash    radiopaque contrast agent. Heavy rash and itching 20 yrs ago. Needs full premeds and does well with this.  . Other Rash    High acid food--tomatoes, orange juice    Current Outpatient Prescriptions on File Prior to Visit  Medication Sig Dispense Refill  . albuterol (VENTOLIN HFA) 108 (90 Base) MCG/ACT inhaler INHALE 2 PUFFS INTO THE LUNGS EVERY 4 (FOUR) HOURS AS NEEDED FOR WHEEZING. 18 Inhaler 6  . calcium carbonate (OS-CAL - DOSED IN MG OF ELEMENTAL CALCIUM) 1250 MG tablet Take 2 tablets by mouth daily with breakfast. Reported on 09/16/2015    . calcium carbonate (TUMS EX) 750 MG chewable tablet Chew 1 tablet by mouth as needed (for gas and reflux).     . cetirizine (ZYRTEC) 10 MG tablet Take 10 mg by mouth daily as needed for allergies. Reported on 10/27/2015    . Cholecalciferol (VITAMIN D3) 1000 UNITS CAPS Take 1,000 Units by mouth daily. Reported on 09/16/2015    . clonazePAM (KLONOPIN) 1 MG tablet TAKE 1 TABLET BY MOUTH TWICE A DAY (Patient taking differently: TAKE 1/2 TABLET BY MOUTH TWICE A DAY) 30 tablet 0  .  fluticasone (FLONASE) 50 MCG/ACT nasal spray Place 2 sprays into both nostrils daily. 16 g 6  . metroNIDAZOLE (METROGEL) 1 % gel Apply 1 application topically daily as needed (to skin). Apply thin layer to affected area externally    . Multiple Vitamins-Minerals (ICAPS AREDS 2) CAPS Take 2 capsules by mouth daily.    Vladimir Faster Glycol-Propyl Glycol (SYSTANE OP) Apply 1 drop to eye 2 (two) times daily.      Marland Kitchen tiotropium (SPIRIVA HANDIHALER) 18 MCG inhalation capsule Place 1 capsule (18 mcg total) into inhaler and inhale daily. 30 capsule 6  . traMADol (ULTRAM) 50 MG tablet Take 50 mg by mouth Every 6 hours as needed for moderate pain.      No current facility-administered medications on file prior to visit.     Past Medical History  Diagnosis Date  . Achalasia   . Hypercholesterolemia   . Seasonal allergic rhinitis   . Transient ischemic attack   . Mitral valve regurgitation   . Osteopenia   . FH: cholecystectomy   . S/P right inguinal herniorrhaphy   . MVP (mitral valve prolapse)   . MR (mitral regurgitation)   . GERD (gastroesophageal reflux disease)   . Shortness of breath     sometimes at rest  . Stroke ([place]) 10/2002  . Arthritis   . Anxiety     causes shortness  . Hiatal hernia   . Depression   . Chronic obstructive pulmonary disease ([place])   . COPD (chronic obstructive pulmonary disease) ([place])   . Lesion of right lung 06/04/2013  . Heart murmur     hx mitral valve surgery  . Pneumonia 06/04/2013    aspirated food 14    Past Surgical History  Procedure Laterality Date  . Back surgery      x 3  . Foot surgery Left     screw -fx  . Esophagogastroduodenoscopy and savary esophageal dilation.      x9  . Eyebrow surgery  2012  . Rt inguinal herniorrhaphy  1969/1997  . Esophagoscopy w/ botox injection      x9 beginning 2008, most recent Sept. 2012  . Tee without cardioversion  07/06/2011    Procedure: TRANSESOPHAGEAL ECHOCARDIOGRAM (TEE);  Surgeon: Sueanne Margarita, MD;  Location: [facility] ENDOSCOPY;  Service: Cardiovascular;  Laterality: N/A;  . Cholecystectomy    . Mitral valve repair  08/03/2011    Procedure: MINIMALLY INVASIVE MITRAL VALVE REPAIR (MVR);  Surgeon: Rexene Alberts, MD;  Location: [facility];  Service: Open Heart Surgery;  Laterality: Right;  minimally invasive MVR  . Esophagogastroduodenoscopy  01/13/2012    Procedure: ESOPHAGOGASTRODUODENOSCOPY (EGD);  Surgeon: Garlan Fair, MD;  Location: [facility] ENDOSCOPY;  Service: Endoscopy;  Laterality: N/A;  botox /katrina  . Esophagogastroduodenoscopy  06/27/2012    Procedure: ESOPHAGOGASTRODUODENOSCOPY (EGD);  Surgeon: Garlan Fair, MD;  Location: [facility] ENDOSCOPY;  Service: Endoscopy;  Laterality: N/A;  . Botox injection   06/27/2012    Procedure: BOTOX INJECTION;  Surgeon: Garlan Fair, MD;  Location: [facility] ENDOSCOPY;  Service: Endoscopy;  Laterality: N/A;  . Esophagogastroduodenoscopy (egd) with esophageal dilation N/A 11/22/2012    Procedure: ESOPHAGOGASTRODUODENOSCOPY (EGD) WITH BOTOX INJECTION;  Surgeon: Garlan Fair, MD;  Location: [facility] ENDOSCOPY;  Service: Endoscopy;  Laterality: N/A;  . Laparoscopic heller myotomy  July 2014    [facility]  . Video bronchoscopy with endobronchial navigation N/A 04/03/2014    Procedure: VIDEO BRONCHOSCOPY WITH ENDOBRONCHIAL NAVIGATION;  Surgeon: Collene Gobble, MD;  Location: [facility]  OR;  Service: Thoracic;  Laterality: N/A;    Family History  Problem Relation Age of Onset  . Stroke Father 55    cva  . Scleroderma Mother 74  . Lung cancer Sister   . Cancer Sister   . Heart disease Sister   . Anesthesia problems Neg Hx     Social History   Social History  . Marital Status: Married    Spouse Name: N/A  . Number of Children: 2  . Years of Education: N/A   Occupational History  .     Social History Main Topics  . Smoking status: Former Smoker -- 1.50 packs/day for 38 years    Types: Cigarettes    Quit date: 08/29/1997  . Smokeless tobacco: Never Used  . Alcohol Use: No  . Drug Use: No  . Sexual Activity: Not Asked   Other Topics Concern  . None   Social History Narrative      Objective:   Physical Exam BP 132/78 mmHg  Pulse 70  Ht 5\' 9"  (1.753 m)  Wt 165 lb 3.2 oz (74.934 kg)  BMI 24.38 kg/m2  SpO2 97% General:  Awake. Alert. No acute distress. Alone today.  Integument:  Warm & dry. No rash on exposed skin. HEENT:  Moist mucus membranes. No oral ulcers. No scleral injection or icterus. Moderate bilateral nasal turbinate swelling. Cardiovascular:  Regular rate. No edema. No appreciable JVD.  Pulmonary:  Good aeration & clear to auscultation bilaterally. Symmetric chest wall expansion. No accessory muscle use. Abdomen: Soft. Normal bowel sounds.  Nondistended. Grossly nontender. Musculoskeletal:  Normal bulk and tone. No joint deformity or effusion appreciated.  PFT 12/20/13: FVC 3.97 L (97%) FEV1 1.90 L (64%) FEV1/FVC 0.48 FEF 25-75 0.58 L (26%) no bronchodilator response TLC 5.37 L (78%) RV 49% ERV 209% DLCO uncorrected 47%  LABS 05/27/10 Alpha-1 Antitrypsin:  MM    Assessment & Plan:  75 year old male with known underlying moderate COPD. Patient's cough I believe is due to a combination of his allergic rhinitis with postnasal drainage as well as uncontrolled reflux. It does not appear as though the patient's COPD has a strong contributing factor. I did review his prior pulmonary function testing which did show moderate airways obstruction as well as mild restriction. His restriction is likely multifactorial. I instructed the patient to notify my office if he had any new breathing problems before his next appointment as I would be happy to see him sooner. The patient does express suicidal ideation as well as the plan and means. I am concerned that he may be a risk to himself and he is willing to seek treatment at this time voluntarily.  1. Cough: Empiric treatment for infectious process with doxycycline 7 days. Cough suppression with Tessalon Perles. Checking chest x-ray PA/LAT. Checking sputum culture for AFB, fungus, and bacteria. 2. GERD: Follows with [facility]. Continuing Zantac by mouth daily at bedtime. Starting Nexium 40 mg by mouth every morning. Patient counseled on appropriate dietary and lifestyle modifications. 3. Moderate COPD: Checking full pulmonary function testing at next appointment. Continue Spiriva as prescribed. 4. Allergic rhinitis: Continuing Flonase. Patient to restart Claritin. 5. Suicidal ideation: Patient agreeable for formal evaluation at the [facility] ED. Office staff will walk with him over to the E.D. 6. Health maintenance: Patient received Pneumovax September 2015, Prevnar January 2017, & influenza  vaccine September 2016. 7. Follow-up: Patient to return to clinic in 2-3 months or sooner if needed.  Sonia Baller Adrean Heitz,  M.D. [facility] Pager:  [phone] After 3pm or if no response, call [phone] 4:05 PM 10/27/2015

## 2015-10-28 ENCOUNTER — Telehealth: Payer: Self-pay | Admitting: Pulmonary Disease

## 2015-10-28 DIAGNOSIS — F419 Anxiety disorder, unspecified: Secondary | ICD-10-CM

## 2015-10-28 DIAGNOSIS — F329 Major depressive disorder, single episode, unspecified: Secondary | ICD-10-CM | POA: Diagnosis not present

## 2015-10-28 DIAGNOSIS — F32A Depression, unspecified: Secondary | ICD-10-CM | POA: Insufficient documentation

## 2015-10-28 LAB — I-STAT CHEM 8, ED
BUN: 20 mg/dL (ref 6–20)
CHLORIDE: 101 mmol/L (ref 101–111)
Calcium, Ion: 1.21 mmol/L (ref 1.13–1.30)
Creatinine, Ser: 0.9 mg/dL (ref 0.61–1.24)
Glucose, Bld: 132 mg/dL — ABNORMAL HIGH (ref 65–99)
HCT: 50 % (ref 39.0–52.0)
HEMOGLOBIN: 17 g/dL (ref 13.0–17.0)
POTASSIUM: 4.2 mmol/L (ref 3.5–5.1)
Sodium: 141 mmol/L (ref 135–145)
TCO2: 29 mmol/L (ref 0–100)

## 2015-10-28 LAB — RAPID URINE DRUG SCREEN, HOSP PERFORMED
AMPHETAMINES: NOT DETECTED
BARBITURATES: NOT DETECTED
Benzodiazepines: NOT DETECTED
COCAINE: NOT DETECTED
OPIATES: NOT DETECTED
TETRAHYDROCANNABINOL: NOT DETECTED

## 2015-10-28 NOTE — Consult Note (Signed)
[facility] Consult   Reason for Consult:  Anxiety, insomnia Referring Physician:  EDP Patient Identification: Chris Lewis MRN:  [id number] Principal Diagnosis: Anxiety disorder, unspecified Diagnosis:   Patient Active Problem List   Diagnosis Date Noted  . Anxiety disorder, unspecified [F41.9] 12/19/2009    Priority: High  . Cough [R05] 10/27/2015  . Nodule of right lung [R91.1] 09/10/2013  . H/O respiratory system disease [Z87.09] 03/01/2013  . Temporary cerebral vascular dysfunction [G93.9] 03/01/2013  . S/P MVR (mitral valve repair) [Z98.890] 08/23/2011  . Achalasia [K22.0] 08/04/2011  . Incidental lung nodule, > 31m and < 83m[R91.1] 06/07/2011  . MVP (mitral valve prolapse) [I34.1]   . Transient ischemic attack [G45.9]   . History of back surgery [Z98.890]   . HYPERLIPIDEMIA [E78.5] 12/19/2009  . DEPRESSION [F32.9] 12/19/2009  . HYPERTENSION, BENIGN [I10] 12/19/2009  . ALLERGIC RHINITIS, SEASONAL [J30.1] 12/19/2009  . Obstructive chronic bronchitis without exacerbation, due to chronic aspiration Gold C [J44.9] 12/19/2009  . Esophageal reflux [K21.9] 12/19/2009    Total Time spent with patient: 45 minutes  Subjective:   Chris DALZIELs a [age].o. male patient admitted with Anxiety  HPI:  Caucasian male, [age]ears old was evaluated today after he was advised by his Primary care Physician to come to the ER for Psychiatric evaluation.   Patient states he informed his doctor that he has been thinking about death and dying lately because he is getting old.  His doctor asked him to come for the evaluation.   Patient also stated that he read an article sent to him by his insurance company regarding the use of Klonopin and asked his doctor to decrease his Klonopin.  Patient states he was surprised when his doctor increased his Klonopin instead of decreasing his dose.  Patient does not want to take Klonopin anymore.  He reports that he has been taking Klonopin for a  long time for anxiety and to help him sleep.  Patient  Today vehemently denies suicidal ideation and has no hx of previous attempt.  Patient is married and lives with his wife and daughter who moved back to the home.  Patient states that his daughter is a stressor to him at this.  He reports good appetite and is going to seek a different sleep medication from [facility] Patient is discharged home.  Past Psychiatric History:  Depression, Anxiety  Risk to Self: Suicidal Ideation: No-Not Currently/Within Last 6 Months Suicidal Intent: No Is patient at risk for suicide?: No Suicidal Plan?: No Access to Means: Yes Specify Access to Suicidal Means: Has guns in the home. What has been your use of drugs/alcohol within the last 12 months?: None How many times?: 0 Other Self Harm Risks: None Triggers for Past Attempts: None known Intentional Self Injurious Behavior: None Risk to Others: Homicidal Ideation: No Thoughts of Harm to Others: No Current Homicidal Intent: No Current Homicidal Plan: No Access to Homicidal Means: No Identified Victim: No one History of harm to others?: No Assessment of Violence: None Noted Violent Behavior Description: None noted Does patient have access to weapons?: Yes (Comment) (Pt says he has guns.) Criminal Charges Pending?: No Does patient have a court date: No Prior Inpatient Therapy: Prior Inpatient Therapy: No Prior Therapy Dates: None Prior Therapy Facilty/Provider(s): None Reason for Treatment: None Prior Outpatient Therapy: Prior Outpatient Therapy: No Prior Therapy Dates: None Prior Therapy Facilty/Provider(s): N/A Reason for Treatment: N/A Does patient have an ACCT team?: No Does patient have  Intensive In-House Services?  : No Does patient have [facility] services? : No Does patient have P4CC services?: No  Past Medical History:  Past Medical History  Diagnosis Date  . Achalasia   . Hypercholesterolemia   . Seasonal allergic rhinitis   .  Transient ischemic attack   . Mitral valve regurgitation   . Osteopenia   . FH: cholecystectomy   . S/P right inguinal herniorrhaphy   . MVP (mitral valve prolapse)   . MR (mitral regurgitation)   . GERD (gastroesophageal reflux disease)   . Shortness of breath     sometimes at rest  . Stroke ([place]) 10/2002  . Arthritis   . Anxiety     causes shortness  . Hiatal hernia   . Depression   . Chronic obstructive pulmonary disease ([place])   . COPD (chronic obstructive pulmonary disease) ([place])   . Lesion of right lung 06/04/2013  . Heart murmur     hx mitral valve surgery  . Pneumonia 06/04/2013    aspirated food 14    Past Surgical History  Procedure Laterality Date  . Back surgery      x 3  . Foot surgery Left     screw -fx  . Esophagogastroduodenoscopy and savary esophageal dilation.      x9  . Eyebrow surgery  2012  . Rt inguinal herniorrhaphy  1969/1997  . Esophagoscopy w/ botox injection      x9 beginning 2008, most recent Sept. 2012  . Tee without cardioversion  07/06/2011    Procedure: TRANSESOPHAGEAL ECHOCARDIOGRAM (TEE);  Surgeon: Sueanne Margarita, MD;  Location: [facility] ENDOSCOPY;  Service: Cardiovascular;  Laterality: N/A;  . Cholecystectomy    . Mitral valve repair  08/03/2011    Procedure: MINIMALLY INVASIVE MITRAL VALVE REPAIR (MVR);  Surgeon: Rexene Alberts, MD;  Location: [facility];  Service: Open Heart Surgery;  Laterality: Right;  minimally invasive MVR  . Esophagogastroduodenoscopy  01/13/2012    Procedure: ESOPHAGOGASTRODUODENOSCOPY (EGD);  Surgeon: Garlan Fair, MD;  Location: [facility] ENDOSCOPY;  Service: Endoscopy;  Laterality: N/A;  botox /katrina  . Esophagogastroduodenoscopy  06/27/2012    Procedure: ESOPHAGOGASTRODUODENOSCOPY (EGD);  Surgeon: Garlan Fair, MD;  Location: [facility] ENDOSCOPY;  Service: Endoscopy;  Laterality: N/A;  . Botox injection  06/27/2012    Procedure: BOTOX INJECTION;  Surgeon: Garlan Fair, MD;  Location: [facility] ENDOSCOPY;  Service: Endoscopy;   Laterality: N/A;  . Esophagogastroduodenoscopy (egd) with esophageal dilation N/A 11/22/2012    Procedure: ESOPHAGOGASTRODUODENOSCOPY (EGD) WITH BOTOX INJECTION;  Surgeon: Garlan Fair, MD;  Location: [facility] ENDOSCOPY;  Service: Endoscopy;  Laterality: N/A;  . Laparoscopic heller myotomy  July 2014    [facility]  . Video bronchoscopy with endobronchial navigation N/A 04/03/2014    Procedure: VIDEO BRONCHOSCOPY WITH ENDOBRONCHIAL NAVIGATION;  Surgeon: Collene Gobble, MD;  Location: [facility] OR;  Service: Thoracic;  Laterality: N/A;   Family History:  Family History  Problem Relation Age of Onset  . Stroke Father 4    cva  . Scleroderma Mother 74  . Lung cancer Sister   . Cancer Sister   . Heart disease Sister   . Anesthesia problems Neg Hx    Family Psychiatric  History:  Denies  Social History:  History  Alcohol Use No     History  Drug Use No    Social History   Social History  . Marital Status: Married    Spouse Name: N/A  . Number of Children: 2  . Years  of Education: N/A   Occupational History  .     Social History Main Topics  . Smoking status: Former Smoker -- 1.50 packs/day for 38 years    Types: Cigarettes    Quit date: 08/29/1997  . Smokeless tobacco: Never Used  . Alcohol Use: No  . Drug Use: No  . Sexual Activity: Not Asked   Other Topics Concern  . None   Social History Narrative   Additional Social History:    Allergies:   Allergies  Allergen Reactions  . Duac [Clindamycin Phos-Benzoyl Perox] Rash  . Contrast Media [Iodinated Diagnostic Agents] Rash    Heavy rash and itching 20 yrs ago. Needs full premeds and does well with this.  . Atorvastatin     muscle pain  . Propoxyphene N-Acetaminophen Itching    rash  . Rosuvastatin     muscle weakness  . Metrizamide Rash    radiopaque contrast agent. Heavy rash and itching 20 yrs ago. Needs full premeds and does well with this.  . Other Rash    High acid food--tomatoes, orange juice    Labs:   Results for orders placed or performed during the hospital encounter of 10/27/15 (from the past 48 hour(s))  CBC with Differential     Status: Abnormal   Collection Time: 10/27/15  6:55 PM  Result Value Ref Range   WBC 14.4 (H) 4.0 - 10.5 K/uL   RBC 5.20 4.22 - 5.81 MIL/uL   Hemoglobin 15.9 13.0 - 17.0 g/dL   HCT 48.7 39.0 - 52.0 %   MCV 93.7 78.0 - 100.0 fL   MCH 30.6 26.0 - 34.0 pg   MCHC 32.6 30.0 - 36.0 g/dL   RDW 13.3 11.5 - 15.5 %   Platelets 200 150 - 400 K/uL   Neutrophils Relative % 88 %   Neutro Abs 12.6 (H) 1.7 - 7.7 K/uL   Lymphocytes Relative 8 %   Lymphs Abs 1.2 0.7 - 4.0 K/uL   Monocytes Relative 4 %   Monocytes Absolute 0.5 0.1 - 1.0 K/uL   Eosinophils Relative 0 %   Eosinophils Absolute 0.0 0.0 - 0.7 K/uL   Basophils Relative 0 %   Basophils Absolute 0.0 0.0 - 0.1 K/uL  Basic metabolic panel     Status: Abnormal   Collection Time: 10/27/15  6:55 PM  Result Value Ref Range   Sodium 143 135 - 145 mmol/L   Potassium 5.7 (H) 3.5 - 5.1 mmol/L   Chloride 105 101 - 111 mmol/L   CO2 28 22 - 32 mmol/L   Glucose, Bld 132 (H) 65 - 99 mg/dL   BUN 21 (H) 6 - 20 mg/dL   Creatinine, Ser 1.18 0.61 - 1.24 mg/dL   Calcium 10.3 8.9 - 10.3 mg/dL   GFR calc non Af Amer 58 (L) >60 mL/min   GFR calc Af Amer >60 >60 mL/min    Comment: (NOTE) The eGFR has been calculated using the CKD EPI equation. This calculation has not been validated in all clinical situations. eGFR's persistently <60 mL/min signify possible Chronic Kidney Disease.    Anion gap 10 5 - 15  Ethanol     Status: None   Collection Time: 10/27/15  6:55 PM  Result Value Ref Range   Alcohol, Ethyl (B) <5 <5 mg/dL    Comment:        LOWEST DETECTABLE LIMIT FOR SERUM ALCOHOL IS 5 mg/dL FOR MEDICAL PURPOSES ONLY   Urine rapid drug screen (hosp performed)  Status: None   Collection Time: 10/27/15 10:10 PM  Result Value Ref Range   Opiates NONE DETECTED NONE DETECTED   Cocaine NONE DETECTED NONE DETECTED    Benzodiazepines NONE DETECTED NONE DETECTED   Amphetamines NONE DETECTED NONE DETECTED   Tetrahydrocannabinol NONE DETECTED NONE DETECTED   Barbiturates NONE DETECTED NONE DETECTED    Comment:        DRUG SCREEN FOR MEDICAL PURPOSES ONLY.  IF CONFIRMATION IS NEEDED FOR ANY PURPOSE, NOTIFY LAB WITHIN 5 DAYS.        LOWEST DETECTABLE LIMITS FOR URINE DRUG SCREEN Drug Class       Cutoff (ng/mL) Amphetamine      1000 Barbiturate      200 Benzodiazepine   932 Tricyclics       671 Opiates          300 Cocaine          300 THC              50   I-stat Chem 8, ED     Status: Abnormal   Collection Time: 10/28/15 12:25 AM  Result Value Ref Range   Sodium 141 135 - 145 mmol/L   Potassium 4.2 3.5 - 5.1 mmol/L   Chloride 101 101 - 111 mmol/L   BUN 20 6 - 20 mg/dL   Creatinine, Ser 0.90 0.61 - 1.24 mg/dL   Glucose, Bld 132 (H) 65 - 99 mg/dL   Calcium, Ion 1.21 1.13 - 1.30 mmol/L   TCO2 29 0 - 100 mmol/L   Hemoglobin 17.0 13.0 - 17.0 g/dL   HCT 50.0 39.0 - 52.0 %    Current Facility-Administered Medications  Medication Dose Route Frequency Provider Last Rate Last Dose  . alum & mag hydroxide-simeth (MAALOX/MYLANTA) 200-200-20 MG/5ML suspension 30 mL  30 mL Oral PRN Nat Christen, MD      . ibuprofen (ADVIL,MOTRIN) tablet 600 mg  600 mg Oral Q8H PRN Nat Christen, MD      . LORazepam (ATIVAN) tablet 1 mg  1 mg Oral Q8H PRN Nat Christen, MD      . nicotine (NICODERM CQ - dosed in mg/24 hours) patch 21 mg  21 mg Transdermal Daily Nat Christen, MD   21 mg at 10/27/15 2029  . ondansetron (ZOFRAN) tablet 4 mg  4 mg Oral Q8H PRN Nat Christen, MD      . zolpidem [facility]) tablet 5 mg  5 mg Oral QHS PRN Nat Christen, MD   5 mg at 10/28/15 2458   Current Outpatient Prescriptions  Medication Sig Dispense Refill  . albuterol (VENTOLIN HFA) 108 (90 Base) MCG/ACT inhaler INHALE 2 PUFFS INTO THE LUNGS EVERY 4 (FOUR) HOURS AS NEEDED FOR WHEEZING. 18 Inhaler 6  . amoxicillin (AMOXIL) 500 MG capsule Take 2,000 mg  by mouth as needed (prior to dental appointments.).    Marland Kitchen calcium carbonate (OS-CAL - DOSED IN MG OF ELEMENTAL CALCIUM) 1250 MG tablet Take 2 tablets by mouth daily with breakfast. Reported on 09/16/2015    . calcium carbonate (TUMS EX) 750 MG chewable tablet Chew 1 tablet by mouth as needed (for gas and reflux).     . cetirizine (ZYRTEC) 10 MG tablet Take 10 mg by mouth daily as needed for allergies. Reported on 10/27/2015    . Cholecalciferol (VITAMIN D3) 1000 UNITS CAPS Take 1,000 Units by mouth daily. Reported on 09/16/2015    . clonazePAM (KLONOPIN) 1 MG tablet TAKE 1 TABLET BY MOUTH TWICE A DAY (Patient  taking differently: Take 1 mg by mouth at bedtime.) 30 tablet 0  . fluticasone (FLONASE) 50 MCG/ACT nasal spray Place 2 sprays into both nostrils daily. 16 g 6  . metroNIDAZOLE (METROGEL) 1 % gel Apply 1 application topically daily as needed (to skin). Apply thin layer to affected area externally    . Multiple Vitamins-Minerals (ICAPS AREDS 2) CAPS Take 1 capsule by mouth 2 (two) times daily.     . pantoprazole (PROTONIX) 40 MG tablet Take 40 mg by mouth daily.     Vladimir Faster Glycol-Propyl Glycol (SYSTANE OP) Apply 1 drop to eye 2 (two) times daily.      Marland Kitchen tiotropium (SPIRIVA HANDIHALER) 18 MCG inhalation capsule Place 1 capsule (18 mcg total) into inhaler and inhale daily. 30 capsule 6  . traMADol (ULTRAM) 50 MG tablet Take 50 mg by mouth Every 6 hours as needed for moderate pain.     . benzonatate (TESSALON PERLES) 100 MG capsule Take 1-2 capsules (100-200 mg total) by mouth 3 (three) times daily as needed for cough. 90 capsule 1  . doxycycline (VIBRA-TABS) 100 MG tablet Take 1 tablet (100 mg total) by mouth 2 (two) times daily. 14 tablet 0  . esomeprazole (NEXIUM) 40 MG capsule Take 1 capsule (40 mg total) by mouth every morning. 30 capsule 3  . predniSONE (DELTASONE) 20 MG tablet Take 20 mg by mouth 2 (two) times daily with a meal.       Musculoskeletal: Strength & Muscle Tone: within  normal limits Gait & Station: normal Patient leans: N/A  Psychiatric Specialty Exam: Review of Systems  Constitutional: Negative.   HENT: Negative.   Eyes: Negative.   Respiratory: Negative.   Cardiovascular: Negative.        Hx of HTN  Gastrointestinal: Negative.   Genitourinary: Negative.        Hx of GERD  Musculoskeletal: Negative.   Skin: Negative.   Neurological: Negative.   Endo/Heme/Allergies: Negative.     Blood pressure 131/91, pulse 57, temperature 97.8 F (36.6 C), temperature source Oral, resp. rate 18, SpO2 99 %.There is no weight on file to calculate BMI.  General Appearance: Casual and Fairly Groomed  Engineer, water::  Good  Speech:  Clear and Coherent and Normal Rate  Volume:  Normal  Mood:  Anxious and Euthymic  Affect:  Congruent  Thought Process:  Coherent, Goal Directed and Intact  Orientation:  Full (Time, Place, and Person)  Thought Content:  WDL  Suicidal Thoughts:  No  Homicidal Thoughts:  No  Memory:  Immediate;   Good Recent;   Good Remote;   Good  Judgement:  Good  Insight:  Good  Psychomotor Activity:  Normal  Concentration:  Good  Recall:  Good  Fund of Knowledge:Good  Language: Good  Akathisia:  NA  Handed:  Right  AIMS (if indicated):     Assets:  Desire for Improvement  ADL's:  Intact  Cognition: WNL  Sleep:       Disposition:  Discharge home, follow up with your Primary care Physician.   Delfin Gant, NP   PMHNP-BC 10/28/2015 10:57 AM Patient seen face-to-face for psychiatric evaluation, chart reviewed and case discussed with the physician extender and developed treatment plan. Reviewed the information documented and agree with the treatment plan. Corena Pilgrim, MD

## 2015-10-28 NOTE — ED Notes (Signed)
Belongings returned to patient.

## 2015-10-28 NOTE — ED Notes (Signed)
Pt denies SI/HI/AVH

## 2015-10-28 NOTE — Telephone Encounter (Signed)
Per 10/27/15 OV: Patient Instructions        Keep taking your Zantac as prescribed.  I am starting you on Nexium to take in the morning 30 minutes before you eat breakfast.    Keep taking your inhalers as prescribed.  I'm starting you on Doxycycline to help with any infection you might have. Don't take the medication with any dairy products. Remain upright for 1 hour after your take the antibiotic.    Avoid eating or drinking any significant liquids within 3 hours of bedtime.  Try elevating the head of your bed by 3-5 inches by putting  A couple of 2x4 under the front posts  Restart taking your Claritin daily.    I will see you back in 2-3 months or sooner if needed.  TESTS ORDERED: 1. Sputum Culture for AFB, Fungal, and Bacterial cultures 2. Full Pulmonary function testing at next appointment 3. CXR PA/LAT today  ---  LMTCB x1 for pt Cxr still needs to be done

## 2015-10-29 NOTE — Telephone Encounter (Signed)
Spoke with the pt  He states that he the nurse from the hospital came by his home last visit to return folder he left there with his papers He has his AVS and has already started on Doxy and following all instructions given His breathing is doing well today, although he c/o some nasal congestion  I advised to try some OTC nasal ns to help  He verbalized understanding   He expressed to me that he was unhappy with his ED visit  He states that they did not give him his spiriva when he was c/o "being stopped up"  He states "all they have there is police officers"  I offered to give him the number to the pt experience office and he declined "I will just put this behind me"  He states psych advised he needed to decrease his klonpin, but did not give instructions on how to do so, and was advised to call his PCP  He told me Dr Laurann Montana dismissed his need for medical advise on depression at his last visit and "treated me like a cow"  I offered to ask Dr Ashok Cordia about referral to a [facility] PCP and he states will think about this and call us back   Pt seem to be in good spirits today, and laughed often, despite being unhappy with care from hospital and PCP  I have asked him to return here for cxr at his earliest convenience   Will forward to [facility] so he is aware

## 2015-10-30 ENCOUNTER — Ambulatory Visit (INDEPENDENT_AMBULATORY_CARE_PROVIDER_SITE_OTHER)
Admission: RE | Admit: 2015-10-30 | Discharge: 2015-10-30 | Disposition: A | Discharge status: Home / Self Care | Payer: BLUE CROSS/BLUE SHIELD | Source: Ambulatory Visit | Attending: Pulmonary Disease | Admitting: Pulmonary Disease

## 2015-10-30 DIAGNOSIS — R05 Cough: Secondary | ICD-10-CM | POA: Diagnosis not present

## 2015-10-30 DIAGNOSIS — R059 Cough, unspecified: Secondary | ICD-10-CM

## 2015-11-03 ENCOUNTER — Telehealth (HOSPITAL_BASED_OUTPATIENT_CLINIC_OR_DEPARTMENT_OTHER): Payer: Self-pay | Admitting: Emergency Medicine

## 2015-11-10 DIAGNOSIS — D225 Melanocytic nevi of trunk: Secondary | ICD-10-CM | POA: Diagnosis not present

## 2015-11-10 DIAGNOSIS — L812 Freckles: Secondary | ICD-10-CM | POA: Diagnosis not present

## 2015-11-10 DIAGNOSIS — L821 Other seborrheic keratosis: Secondary | ICD-10-CM | POA: Diagnosis not present

## 2015-11-10 DIAGNOSIS — L718 Other rosacea: Secondary | ICD-10-CM | POA: Diagnosis not present

## 2015-11-27 ENCOUNTER — Telehealth: Payer: Self-pay | Admitting: Acute Care

## 2015-11-27 DIAGNOSIS — R059 Cough, unspecified: Secondary | ICD-10-CM

## 2015-11-27 DIAGNOSIS — R05 Cough: Secondary | ICD-10-CM

## 2015-11-27 NOTE — Telephone Encounter (Signed)
Thanks [place]. That's great. I will see him Monday.

## 2015-11-27 NOTE — Telephone Encounter (Signed)
Called patient to discuss COPD Trial with him, he has complaints of SOB, Coughing up yellow mucus, severe fatigue and severe pain in his rib cage with breathing.  Patient states that his PCP is no longer practicing and he has to get a new PCP.  I advised him that if he is having these problems he needs to be seen as soon as possible, attempted to schedule patient to be seen today, but patient refused because he is tending to his grandchildren today.  Patient scheduled appointment with Eric Form, NP on Monday at [place] patient that if he gets worse during the weekend that he needs to go to ER.  Sarah, I ordered your usual, CXR, BMP, CBC.  Patient stated he would arrive early so he can get labs/Xray done prior to visit. FYI to Judson Roch.

## 2015-12-01 ENCOUNTER — Ambulatory Visit (INDEPENDENT_AMBULATORY_CARE_PROVIDER_SITE_OTHER): Payer: BLUE CROSS/BLUE SHIELD | Admitting: Acute Care

## 2015-12-01 ENCOUNTER — Encounter: Payer: Self-pay | Admitting: Acute Care

## 2015-12-01 ENCOUNTER — Other Ambulatory Visit (INDEPENDENT_AMBULATORY_CARE_PROVIDER_SITE_OTHER): Payer: BLUE CROSS/BLUE SHIELD

## 2015-12-01 ENCOUNTER — Ambulatory Visit (INDEPENDENT_AMBULATORY_CARE_PROVIDER_SITE_OTHER)
Admission: RE | Admit: 2015-12-01 | Discharge: 2015-12-01 | Disposition: A | Discharge status: Home / Self Care | Payer: BLUE CROSS/BLUE SHIELD | Source: Ambulatory Visit | Attending: Acute Care | Admitting: Acute Care

## 2015-12-01 ENCOUNTER — Telehealth: Payer: Self-pay | Admitting: Internal Medicine

## 2015-12-01 VITALS — BP 110/66 | HR 70 | Temp 98.7°F | Ht 69.0 in | Wt 167.4 lb

## 2015-12-01 DIAGNOSIS — R0781 Pleurodynia: Secondary | ICD-10-CM | POA: Diagnosis not present

## 2015-12-01 DIAGNOSIS — R05 Cough: Secondary | ICD-10-CM

## 2015-12-01 DIAGNOSIS — R911 Solitary pulmonary nodule: Secondary | ICD-10-CM

## 2015-12-01 DIAGNOSIS — R059 Cough, unspecified: Secondary | ICD-10-CM

## 2015-12-01 DIAGNOSIS — R7989 Other specified abnormal findings of blood chemistry: Secondary | ICD-10-CM

## 2015-12-01 LAB — BASIC METABOLIC PANEL
BUN: 15 mg/dL (ref 6–23)
CHLORIDE: 106 meq/L (ref 96–112)
CO2: 27 mEq/L (ref 19–32)
CREATININE: 1.08 mg/dL (ref 0.40–1.50)
Calcium: 9.6 mg/dL (ref 8.4–10.5)
GFR: 70.55 mL/min (ref >60.00)
Glucose, Bld: 147 mg/dL — ABNORMAL HIGH (ref 70–99)
Potassium: 3.9 mEq/L (ref 3.5–5.1)
Sodium: 140 mEq/L (ref 135–145)

## 2015-12-01 LAB — CBC WITH DIFFERENTIAL/PLATELET
Basophils Absolute: 0 10*3/uL (ref 0.0–0.1)
Basophils Relative: 0.4 % (ref 0.0–3.0)
Eosinophils Absolute: 0.1 10*3/uL (ref 0.0–0.7)
Eosinophils Relative: 1.6 % (ref 0.0–5.0)
HCT: 46.6 % (ref 39.0–52.0)
Hemoglobin: 15.7 g/dL (ref 13.0–17.0)
Lymphocytes Relative: 15.8 % (ref 12.0–46.0)
Lymphs Abs: 1.4 10*3/uL (ref 0.7–4.0)
MCHC: 33.6 g/dL (ref 30.0–36.0)
MCV: 88.8 fl (ref 78.0–100.0)
Monocytes Absolute: 0.6 10*3/uL (ref 0.1–1.0)
Monocytes Relative: 6.7 % (ref 3.0–12.0)
Neutro Abs: 6.5 10*3/uL (ref 1.4–7.7)
Neutrophils Relative %: 75.5 % (ref 43.0–77.0)
Platelets: 167 10*3/uL (ref 150.0–400.0)
RBC: 5.25 Mil/uL (ref 4.22–5.81)
RDW: 13.8 % (ref 11.5–15.5)
WBC: 8.6 10*3/uL (ref 4.0–10.5)

## 2015-12-01 LAB — D-DIMER, QUANTITATIVE (NOT AT ARMC): D DIMER QUANT: 0.62 ug{FEU}/mL — AB (ref 0.00–0.48)

## 2015-12-01 NOTE — Patient Instructions (Addendum)
It is nice to meet you today. Chest x-ray Your Chest X Ray today was clear. Continue you protonix  and nexium We will check a d-dimer today. I will call you lab results  Ibuprofen 600 mg every 8 hours for pain Drink plenty of water with this. Do not take on an empty stomach.. Heating pad for pain to pain site. Continue your allergy medicine Zyrtec and Flonase Follow up in 2-3 weeks Please contact office for sooner follow up if symptoms do not improve or worsen or seek emergency care.

## 2015-12-01 NOTE — Assessment & Plan Note (Signed)
Generalized rib pain Plan: Chest x-ray Your Chest X Ray today was clear. Continue you protonix  and nexium We will check a d-dimer today. I will call you lab results  Ibuprofen 600 mg every 8 hours for pain Drink plenty of water with this. Do not take on an empty stomach.. Heating pad for pain to pain site. Continue your allergy medicine Zyrtec and Flonase Follow up in 2-3 weeks Please contact office for sooner follow up if symptoms do not improve or worsen or seek emergency care.

## 2015-12-01 NOTE — Telephone Encounter (Signed)
This patient is established in pulmonary. He has had a friend to refer him to you. He is asking to become your patient. Would you like to accept him under your care?  His friend is Fritz Pickerel Still

## 2015-12-01 NOTE — Progress Notes (Signed)
Subjective:    Patient ID: Chris Lewis, male    DOB: [birth year], 75 y.o.   MRN: [id number]  HPI 75 year old male seen by Dr. Ashok Cordia for moderate COPD, allergic rhinitis, cough, and GERD. Pulmonary function tests reveal moderate airway obstruction as well as mild restriction. Restriction is multifactorial. He should receive Pneumovax September 2015, Prevnar January 2017, and influenza vaccine September 2016.  10/27/2015 :Recent hospitalization for suicidal ideation.  12/01/2015: Chest x-ray: IMPRESSION: Changes of COPD and old granulomatous disease. No acute abnormalities.    12/01/2015: Acute Visit: Patient presents to the office today with complaint of rib pain for approximately 2 weeks. He states he has no shortness of breath, no fever, no worsening cough, no hemoptysis, no orthopnea, no calf or leg pain, no recent automobile or airline travel. He does state that he has fatigue, and weakness. He states that tramadol did help the pain. Chest x-ray today, reviewed personally by me, shows no acute abnormalities. He states that he is compliant with his Zyrtec, and Flonase for seasonal rhinitis. He states that he has raised his bed on blocks to help with his GERD. He does not appear acutely ill. He states he is compliant with his albuterol rescue inhaler, Spiriva , Nexium, Zyrtec and Flonase.Recent treatment in 10/2015 with Doxy x 7 days and prednisone.  Well's Criteria Score:0.0  PE Risk: Low Risk 92.2% unlikely PE Diagnosis   Current outpatient prescriptions:  .  albuterol (VENTOLIN HFA) 108 (90 Base) MCG/ACT inhaler, INHALE 2 PUFFS INTO THE LUNGS EVERY 4 (FOUR) HOURS AS NEEDED FOR WHEEZING., Disp: 18 Inhaler, Rfl: 6 .  amoxicillin (AMOXIL) 500 MG capsule, Take 2,000 mg by mouth as needed (prior to dental appointments.)., Disp: , Rfl:  .  benzonatate (TESSALON PERLES) 100 MG capsule, Take 1-2 capsules (100-200 mg total) by mouth 3 (three) times daily as needed for cough., Disp: 90  capsule, Rfl: 1 .  calcium carbonate (OS-CAL - DOSED IN MG OF ELEMENTAL CALCIUM) 1250 MG tablet, Take 2 tablets by mouth daily with breakfast. Reported on 09/16/2015, Disp: , Rfl:  .  calcium carbonate (TUMS EX) 750 MG chewable tablet, Chew 1 tablet by mouth as needed (for gas and reflux). , Disp: , Rfl:  .  cetirizine (ZYRTEC) 10 MG tablet, Take 10 mg by mouth daily as needed for allergies. Reported on 10/27/2015, Disp: , Rfl:  .  Cholecalciferol (VITAMIN D3) 1000 UNITS CAPS, Take 1,000 Units by mouth daily. Reported on 09/16/2015, Disp: , Rfl:  .  clonazePAM (KLONOPIN) 1 MG tablet, TAKE 1 TABLET BY MOUTH TWICE A DAY (Patient taking differently: Take 1 mg by mouth at bedtime.), Disp: 30 tablet, Rfl: 0 .  doxycycline (VIBRA-TABS) 100 MG tablet, Take 1 tablet (100 mg total) by mouth 2 (two) times daily., Disp: 14 tablet, Rfl: 0 .  esomeprazole (NEXIUM) 40 MG capsule, Take 1 capsule (40 mg total) by mouth every morning., Disp: 30 capsule, Rfl: 3 .  fluticasone (FLONASE) 50 MCG/ACT nasal spray, Place 2 sprays into both nostrils daily., Disp: 16 g, Rfl: 6 .  metroNIDAZOLE (METROGEL) 1 % gel, Apply 1 application topically daily as needed (to skin). Apply thin layer to affected area externally, Disp: , Rfl:  .  Multiple Vitamins-Minerals (ICAPS AREDS 2) CAPS, Take 1 capsule by mouth 2 (two) times daily. , Disp: , Rfl:  .  pantoprazole (PROTONIX) 40 MG tablet, Take 40 mg by mouth daily. , Disp: , Rfl:  .  Polyethyl Glycol-Propyl Glycol (SYSTANE OP), Apply 1  drop to eye 2 (two) times daily.  , Disp: , Rfl:  .  predniSONE (DELTASONE) 20 MG tablet, Take 20 mg by mouth 2 (two) times daily with a meal. , Disp: , Rfl:  .  tiotropium (SPIRIVA HANDIHALER) 18 MCG inhalation capsule, Place 1 capsule (18 mcg total) into inhaler and inhale daily., Disp: 30 capsule, Rfl: 6 .  traMADol (ULTRAM) 50 MG tablet, Take 50 mg by mouth Every 6 hours as needed for moderate pain. , Disp: , Rfl:   Review of Systems Constitutional:    No  weight loss, night sweats,  Fevers, chills, fatigue, or  lassitude.  HEENT:   No headaches,  Difficulty swallowing,  Tooth/dental problems, or  Sore throat,                No sneezing, itching, ear ache, nasal congestion, post nasal drip,   CV:  No chest pain, + Rib pain,  No Orthopnea, PND, swelling in lower extremities, anasarca, dizziness, palpitations, syncope.   GI  No heartburn, indigestion, abdominal pain, nausea, vomiting, diarrhea, change in bowel habits, loss of appetite, bloody stools.   Resp: No shortness of breath with exertion or at rest.  No excess mucus, + productive cough,  No non-productive cough,  No coughing up of blood.  No change in color of mucus.  No wheezing.  No chest wall deformity  Skin: no rash or lesions.  GU: no dysuria, change in color of urine, no urgency or frequency.  No flank pain, no hematuria   MS:  No joint pain or swelling.  No decreased range of motion.  No back pain.  Psych:  No change in mood or affect. No depression or anxiety.  No memory loss.        Objective:   Physical Exam  BP 110/66 mmHg  Pulse 70  Temp(Src) 98.7 F (37.1 C) (Oral)  Ht 5\' 9"  (1.753 m)  Wt 167 lb 6.4 oz (75.932 kg)  BMI 24.71 kg/m2  SpO2 97%  Physical Exam:  General- No distress,  A&Ox3 ENT: No sinus tenderness, TM clear, pale nasal mucosa, no oral exudate,no post nasal drip, no LAN Cardiac: S1, S2, regular rate and rhythm, no murmur Chest: No wheeze/ rales/ dullness; no accessory muscle use, no nasal flaring, no sternal retractions Abd.: Soft Non-tender Ext: No clubbing cyanosis, edema Neuro:  normal strength Skin: No rashes, warm and dry Psych: normal mood and behavior Magdalen Spatz, AGACNP-BC [facility]  12/01/2015    Assessment & Plan:

## 2015-12-02 ENCOUNTER — Telehealth: Payer: Self-pay | Admitting: Acute Care

## 2015-12-02 NOTE — Telephone Encounter (Signed)
I have called Chris Lewis and explained to him that his d-dimer was elevated. I explained that the best plan is to do a CT chest. He is in agreement with this plan and verbalized understanding.I explained that someone from the office will be in touch to schedule the scans. He verbalized understanding.

## 2015-12-02 NOTE — Telephone Encounter (Signed)
Ok with me 

## 2015-12-02 NOTE — Progress Notes (Signed)
Note reviewed.  Sonia Baller Ashok Cordia, M.D. [facility] Pager:  [phone] After 3pm or if no response, call [phone] 2:12 PM 12/02/2015

## 2015-12-03 NOTE — Telephone Encounter (Signed)
Got patient scheduled

## 2015-12-11 ENCOUNTER — Telehealth: Payer: Self-pay | Admitting: Acute Care

## 2015-12-11 DIAGNOSIS — R911 Solitary pulmonary nodule: Secondary | ICD-10-CM

## 2015-12-11 DIAGNOSIS — R059 Cough, unspecified: Secondary | ICD-10-CM

## 2015-12-11 DIAGNOSIS — R7989 Other specified abnormal findings of blood chemistry: Secondary | ICD-10-CM

## 2015-12-11 DIAGNOSIS — R05 Cough: Secondary | ICD-10-CM

## 2015-12-11 MED ORDER — PREDNISONE 50 MG PO TABS: ORAL_TABLET | ORAL | Status: DC

## 2015-12-11 MED ORDER — DIPHENHYDRAMINE HCL 50 MG PO TABS: ORAL_TABLET | ORAL | Status: DC

## 2015-12-11 NOTE — Telephone Encounter (Addendum)
Pt calling to follow up on CT scan that was supposed to be scheduled 12/03/15. States he was made aware that his D-Dimer came back elevated and was going to be set up for a CT Angio. Has not heard anything from our office about scheduling the scan. After researching chart, found that order was placed incorrectly and needed to be placed again and as STAT. Patient aware that we are working on this to get this scheduled asap. Apologized for the long wait - pt states that he feels terrible.  Aware that we are working to get this scheduled ASAP. Pt awaiting call back from out office with appt.  --- Spoke with [facility], they were unable to view the initial CT Angio order d/t it being incorrect ordered. Sherry requests that another order be placed as STAT, order placed and Judeen Hammans verified that she can now see the order. Sherry called [facility] to get this scheduled ASAP. Unfortunately the patient is highly allergic to contrast and is unable to have CT Angio today d/t needing a 13hr pre-med; which is a course of Prednisone and Benadryl. CT can be done 12/12/15 at [facility] CT, arrive at 1:00 for 1:15 appt once pre-med treatment has been completed. The other option is for the patient to go straight to [facility] ED and go through ED on getting pre-treatment and have CT done at [facility] today under their care with IV meds to be given. --- Called patient back to make aware that we are working with Dr Ashok Cordia to get this scheduled.  Need more information about his symptoms: Pt c/o lower chest/rib pain bilateral and burning sensation Increased coughing and congestion since last OV. Loss of appetite about the same as last OV Increased mucus production - yellow/green in color. Denies hemoptysis. Denies any increased SOB - reports no change since last OV. Denies leg swelling.  --- Spoke with Dr Ashok Cordia, okay to keep CT Angio as scheduled for 12/12/15 at 1:15pm Pt will need to complete the 13hr pre-med of Prednisone 50mg  and Benadryl 50mg  prior.   Prednisone 50mg  - Take 1 tablet at 13 hours(12am), 7 hours(6am) and 1 hour(12pm) prior to scan. Benadryl 50mg  - Take 1 tablet 1 hour (12pm) prior to CT Pt aware of appt being scheduled, date/time. Aware that he will need to come by the office first thing in the morning 12/12/15 to have this lab drawn to check his kidney function for contrast purposes. BMET ordered again at STAT, pt to come at 8am to have this drawn. Rx's sent to [facility] pharmacy per patient request. Apologized several times for the inconvenience and the delay in getting his appt scheduled. Pt aware that per JN to go to ED if symptoms worsen over night. Nothing further needed.  Will send to Dr Ashok Cordia as Juluis Rainier.

## 2015-12-12 ENCOUNTER — Ambulatory Visit (INDEPENDENT_AMBULATORY_CARE_PROVIDER_SITE_OTHER)
Admission: RE | Admit: 2015-12-12 | Discharge: 2015-12-12 | Disposition: A | Discharge status: Home / Self Care | Payer: BLUE CROSS/BLUE SHIELD | Source: Ambulatory Visit | Attending: Acute Care | Admitting: Acute Care

## 2015-12-12 ENCOUNTER — Other Ambulatory Visit (INDEPENDENT_AMBULATORY_CARE_PROVIDER_SITE_OTHER): Payer: BLUE CROSS/BLUE SHIELD

## 2015-12-12 DIAGNOSIS — R7989 Other specified abnormal findings of blood chemistry: Secondary | ICD-10-CM

## 2015-12-12 DIAGNOSIS — R791 Abnormal coagulation profile: Secondary | ICD-10-CM

## 2015-12-12 DIAGNOSIS — R911 Solitary pulmonary nodule: Secondary | ICD-10-CM | POA: Diagnosis not present

## 2015-12-12 DIAGNOSIS — R059 Cough, unspecified: Secondary | ICD-10-CM

## 2015-12-12 DIAGNOSIS — R05 Cough: Secondary | ICD-10-CM | POA: Diagnosis not present

## 2015-12-12 LAB — BASIC METABOLIC PANEL
BUN: 22 mg/dL (ref 6–23)
CALCIUM: 10.5 mg/dL (ref 8.4–10.5)
CO2: 27 mEq/L (ref 19–32)
CREATININE: 1.14 mg/dL (ref 0.40–1.50)
Chloride: 102 mEq/L (ref 96–112)
GFR: 66.27 mL/min (ref >60.00)
GLUCOSE: 170 mg/dL — AB (ref 70–99)
Potassium: 4.7 mEq/L (ref 3.5–5.1)
Sodium: 136 mEq/L (ref 135–145)

## 2015-12-12 MED ORDER — IOPAMIDOL (ISOVUE-370) INJECTION 76%
80.0000 mL | Freq: Once | INTRAVENOUS | Status: AC | PRN
  Administered 2015-12-12: 80 mL via INTRAVENOUS

## 2015-12-15 ENCOUNTER — Telehealth: Payer: Self-pay | Admitting: Pulmonary Disease

## 2015-12-15 NOTE — Telephone Encounter (Signed)
Please let Chris Lewis know his CT Angio was negative for any Pulmonary Embolism.  Thanks so much

## 2015-12-15 NOTE — Telephone Encounter (Signed)
Spoke with pt.  He is requesting CT Chest Angio results from Friday.  Sarah, please advise.  Thank you.

## 2015-12-15 NOTE — Telephone Encounter (Signed)
Ct angio was negative. He can try heat and Ibuprofen or Tylenol If the pain is non-managable, he needs to go to the ED or follow up with his PCP.

## 2015-12-15 NOTE — Telephone Encounter (Signed)
Spoke with pt,aware of recs.  Nothing further needed.  

## 2015-12-15 NOTE — Telephone Encounter (Signed)
Pt aware of results.  Pt c/o increased pain in bilateral rib (lower) and pain in sternum. Pt also notes some pain in back between shoulder blades. Pt having to sit a certain way and shift d/t pain - worse over the weekend.  Pt has appt with PCP on Thursday 12/18/15 Please advise Sarah. Thanks.

## 2015-12-18 ENCOUNTER — Encounter: Payer: Self-pay | Admitting: Internal Medicine

## 2015-12-18 ENCOUNTER — Ambulatory Visit (INDEPENDENT_AMBULATORY_CARE_PROVIDER_SITE_OTHER): Payer: BLUE CROSS/BLUE SHIELD | Admitting: Internal Medicine

## 2015-12-18 ENCOUNTER — Other Ambulatory Visit (INDEPENDENT_AMBULATORY_CARE_PROVIDER_SITE_OTHER): Payer: BLUE CROSS/BLUE SHIELD

## 2015-12-18 VITALS — BP 124/82 | HR 63 | Temp 98.1°F | Resp 20 | Wt 166.0 lb

## 2015-12-18 DIAGNOSIS — Z0000 Encounter for general adult medical examination without abnormal findings: Secondary | ICD-10-CM | POA: Insufficient documentation

## 2015-12-18 DIAGNOSIS — R739 Hyperglycemia, unspecified: Secondary | ICD-10-CM

## 2015-12-18 DIAGNOSIS — R6889 Other general symptoms and signs: Secondary | ICD-10-CM

## 2015-12-18 DIAGNOSIS — I10 Essential (primary) hypertension: Secondary | ICD-10-CM

## 2015-12-18 DIAGNOSIS — K219 Gastro-esophageal reflux disease without esophagitis: Secondary | ICD-10-CM

## 2015-12-18 DIAGNOSIS — F329 Major depressive disorder, single episode, unspecified: Secondary | ICD-10-CM | POA: Diagnosis not present

## 2015-12-18 DIAGNOSIS — Z0001 Encounter for general adult medical examination with abnormal findings: Secondary | ICD-10-CM | POA: Diagnosis not present

## 2015-12-18 DIAGNOSIS — E785 Hyperlipidemia, unspecified: Secondary | ICD-10-CM

## 2015-12-18 DIAGNOSIS — R413 Other amnesia: Secondary | ICD-10-CM | POA: Insufficient documentation

## 2015-12-18 DIAGNOSIS — F32A Depression, unspecified: Secondary | ICD-10-CM

## 2015-12-18 LAB — URINALYSIS, ROUTINE W REFLEX MICROSCOPIC
BILIRUBIN URINE: NEGATIVE
Hgb urine dipstick: NEGATIVE
Ketones, ur: NEGATIVE
Leukocytes, UA: NEGATIVE
NITRITE: NEGATIVE
PH: 6 (ref 5.0–8.0)
Specific Gravity, Urine: 1.015 (ref 1.000–1.030)
TOTAL PROTEIN, URINE-UPE24: NEGATIVE
Urine Glucose: NEGATIVE
Urobilinogen, UA: 0.2 (ref 0.0–1.0)

## 2015-12-18 LAB — BASIC METABOLIC PANEL
BUN: 19 mg/dL (ref 6–23)
CO2: 31 mEq/L (ref 19–32)
Calcium: 9.7 mg/dL (ref 8.4–10.5)
Chloride: 101 mEq/L (ref 96–112)
Creatinine, Ser: 1.09 mg/dL (ref 0.40–1.50)
GFR: 69.79 mL/min (ref >60.00)
Glucose, Bld: 100 mg/dL — ABNORMAL HIGH (ref 70–99)
POTASSIUM: 4.3 meq/L (ref 3.5–5.1)
SODIUM: 139 meq/L (ref 135–145)

## 2015-12-18 LAB — CBC WITH DIFFERENTIAL/PLATELET
BASOS PCT: 0.4 % (ref 0.0–3.0)
Basophils Absolute: 0.1 10*3/uL (ref 0.0–0.1)
EOS PCT: 3.2 % (ref 0.0–5.0)
Eosinophils Absolute: 0.4 10*3/uL (ref 0.0–0.7)
HCT: 46.5 % (ref 39.0–52.0)
HEMOGLOBIN: 15.8 g/dL (ref 13.0–17.0)
LYMPHS ABS: 1.4 10*3/uL (ref 0.7–4.0)
Lymphocytes Relative: 12 % (ref 12.0–46.0)
MCHC: 34.1 g/dL (ref 30.0–36.0)
MCV: 88.8 fl (ref 78.0–100.0)
MONO ABS: 0.9 10*3/uL (ref 0.1–1.0)
Monocytes Relative: 8 % (ref 3.0–12.0)
NEUTROS ABS: 8.9 10*3/uL — AB (ref 1.4–7.7)
NEUTROS PCT: 76.4 % (ref 43.0–77.0)
Platelets: 156 10*3/uL (ref 150.0–400.0)
RBC: 5.24 Mil/uL (ref 4.22–5.81)
RDW: 13.8 % (ref 11.5–15.5)
WBC: 11.7 10*3/uL — ABNORMAL HIGH (ref 4.0–10.5)

## 2015-12-18 LAB — LIPID PANEL
CHOL/HDL RATIO: 6
CHOLESTEROL: 205 mg/dL — AB (ref 0–200)
HDL: 33.8 mg/dL — ABNORMAL LOW (ref >39.00)
LDL CALC: 138 mg/dL — AB (ref 0–99)
NonHDL: 170.94
Triglycerides: 167 mg/dL — ABNORMAL HIGH (ref 0.0–149.0)
VLDL: 33.4 mg/dL (ref 0.0–40.0)

## 2015-12-18 LAB — TSH: TSH: 5.3 u[IU]/mL — AB (ref 0.35–4.50)

## 2015-12-18 LAB — HEPATIC FUNCTION PANEL
ALK PHOS: 83 U/L (ref 39–117)
ALT: 14 U/L (ref 0–53)
AST: 19 U/L (ref 0–37)
Albumin: 4.5 g/dL (ref 3.5–5.2)
BILIRUBIN DIRECT: 0.1 mg/dL (ref 0.0–0.3)
BILIRUBIN TOTAL: 1 mg/dL (ref 0.2–1.2)
Total Protein: 6.8 g/dL (ref 6.0–8.3)

## 2015-12-18 LAB — HEMOGLOBIN A1C: Hgb A1c MFr Bld: 6.2 % (ref 4.6–6.5)

## 2015-12-18 LAB — PSA: PSA: 1.1 ng/mL (ref 0.10–4.00)

## 2015-12-18 MED ORDER — CITALOPRAM HYDROBROMIDE 10 MG PO TABS: 10.0000 mg | ORAL_TABLET | Freq: Every day | ORAL | Status: DC

## 2015-12-18 NOTE — Assessment & Plan Note (Signed)

## 2015-12-18 NOTE — Assessment & Plan Note (Signed)
Asympt, stable overall by history and exam, recent data reviewed with pt, and pt to continue medical treatment as before,  to f/u any worsening symptoms or concerns Lab Results  Component Value Date   HGBA1C 5.7* 07/30/2011   For f/u lab

## 2015-12-18 NOTE — Assessment & Plan Note (Signed)
Mild to mod, no SI or HI, o/w , for celexa 10 qd, declines referral for counseling or psychiatry

## 2015-12-18 NOTE — Assessment & Plan Note (Signed)
stable overall by history and exam, no prior lipids done here, for lipid check, low chol diet,, and pt to continue medical treatment as before,  to f/u any worsening symptoms or concerns

## 2015-12-18 NOTE — Patient Instructions (Signed)
Please take all new medication as prescribed - the celexa 10 mg per day  Please continue all other medications as before, and refills have been done if requested.  Please have the pharmacy call with any other refills you may need.  Please continue your efforts at being more active, low cholesterol diet, and weight control.  You are otherwise up to date with prevention measures today.  Please keep your appointments with your specialists as you may have planned  Please go to the LAB in the Basement (turn left off the elevator) for the tests to be done today  You will be contacted by phone if any changes need to be made immediately.  Otherwise, you will receive a letter about your results with an explanation, but please check with MyChart first.  Please remember to sign up for MyChart if you have not done so, as this will be important to you in the future with finding out test results, communicating by private email, and scheduling acute appointments online when needed.  Please return in 6 months, or sooner if needed 

## 2015-12-18 NOTE — Progress Notes (Signed)
Pre visit review using our clinic review tool, if applicable. No additional management support is needed unless otherwise documented below in the visit note. 

## 2015-12-18 NOTE — Assessment & Plan Note (Signed)
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

## 2015-12-18 NOTE — Assessment & Plan Note (Signed)
stable overall by history and exam, recent data reviewed with pt, and pt to continue medical treatment as before,  to f/u any worsening symptoms or concerns Lab Results  Component Value Date   WBC 8.6 12/01/2015   HGB 15.7 12/01/2015   HCT 46.6 12/01/2015   PLT 167.0 12/01/2015   GLUCOSE 170* 12/12/2015   ALT 13 07/30/2011   AST 24 07/30/2011   NA 136 12/12/2015   K 4.7 12/12/2015   CL 102 12/12/2015   CREATININE 1.14 12/12/2015   BUN 22 12/12/2015   CO2 27 12/12/2015   INR 1.06 04/01/2014   HGBA1C 5.7* 07/30/2011

## 2015-12-18 NOTE — Progress Notes (Addendum)
Subjective:    Patient ID: Chris Lewis, male    DOB: 1940/11/01, 75 y.o.   MRN: [id number]  HPI  Here for wellness and f/u;  Overall doing ok;  Pt denies Chest pain, worsening SOB, DOE, wheezing, orthopnea, PND, worsening LE edema, palpitations, dizziness or syncope.  Pt denies neurological change such as new headache, facial or extremity weakness.  Pt denies polydipsia, polyuria, or low sugar symptoms. Pt states overall good compliance with treatment and medications, good tolerability, and has been trying to follow appropriate diet.  Pt has had mild worsening depressive symptoms, but no suicidal ideation or panic.Has taken klonopin in the past for anxiety, ? thinks maybe affect the memory so weaned off very recently, not sure about his memory yet off the med..  No fever, night sweats, wt loss, loss of appetite, or other constitutional symptoms.  Pt states good ability with ADL's, has low fall risk, home safety reviewed and adequate, no other significant changes in hearing or vision, and only occasionally active with exercise.  Has fallen twice in the past yr but no injuries.  Also with recent pna, Friend of Fritz Pickerel still, also a pt here, and has seen Dr Tami Ribas.  Denies worsening reflux, abd pain, dysphagia, n/v, bowel change or blood. Past Medical History  Diagnosis Date  . Achalasia   . Hypercholesterolemia   . Seasonal allergic rhinitis   . Transient ischemic attack   . Mitral valve regurgitation   . Osteopenia   . FH: cholecystectomy   . S/P right inguinal herniorrhaphy   . MVP (mitral valve prolapse)   . MR (mitral regurgitation)   . GERD (gastroesophageal reflux disease)   . Shortness of breath     sometimes at rest  . Stroke ([place]) 10/2002  . Arthritis   . Anxiety     causes shortness  . Hiatal hernia   . Depression   . Chronic obstructive pulmonary disease ([place])   . COPD (chronic obstructive pulmonary disease) ([place])   . Lesion of right lung 06/04/2013  . Heart murmur     hx  mitral valve surgery  . Pneumonia 06/04/2013    aspirated food 14   Past Surgical History  Procedure Laterality Date  . Back surgery      x 3  . Foot surgery Left     screw -fx  . Esophagogastroduodenoscopy and savary esophageal dilation.      x9  . Eyebrow surgery  2012  . Rt inguinal herniorrhaphy  1969/1997  . Esophagoscopy w/ botox injection      x9 beginning 2008, most recent Sept. 2012  . Tee without cardioversion  07/06/2011    Procedure: TRANSESOPHAGEAL ECHOCARDIOGRAM (TEE);  Surgeon: Sueanne Margarita, MD;  Location: [facility] ENDOSCOPY;  Service: Cardiovascular;  Laterality: N/A;  . Cholecystectomy    . Mitral valve repair  08/03/2011    Procedure: MINIMALLY INVASIVE MITRAL VALVE REPAIR (MVR);  Surgeon: Rexene Alberts, MD;  Location: [place];  Service: Open Heart Surgery;  Laterality: Right;  minimally invasive MVR  . Esophagogastroduodenoscopy  01/13/2012    Procedure: ESOPHAGOGASTRODUODENOSCOPY (EGD);  Surgeon: Garlan Fair, MD;  Location: [facility] ENDOSCOPY;  Service: Endoscopy;  Laterality: N/A;  botox /katrina  . Esophagogastroduodenoscopy  06/27/2012    Procedure: ESOPHAGOGASTRODUODENOSCOPY (EGD);  Surgeon: Garlan Fair, MD;  Location: [facility] ENDOSCOPY;  Service: Endoscopy;  Laterality: N/A;  . Botox injection  06/27/2012    Procedure: BOTOX INJECTION;  Surgeon: Garlan Fair, MD;  Location: [facility]  ENDOSCOPY;  Service: Endoscopy;  Laterality: N/A;  . Esophagogastroduodenoscopy (egd) with esophageal dilation N/A 11/22/2012    Procedure: ESOPHAGOGASTRODUODENOSCOPY (EGD) WITH BOTOX INJECTION;  Surgeon: Garlan Fair, MD;  Location: [facility] ENDOSCOPY;  Service: Endoscopy;  Laterality: N/A;  . Laparoscopic heller myotomy  July 2014    [facility]  . Video bronchoscopy with endobronchial navigation N/A 04/03/2014    Procedure: VIDEO BRONCHOSCOPY WITH ENDOBRONCHIAL NAVIGATION;  Surgeon: Collene Gobble, MD;  Location: [place];  Service: Thoracic;  Laterality: N/A;    reports that he quit smoking about  18 years ago. His smoking use included Cigarettes. He has a 57 pack-year smoking history. He has never used smokeless tobacco. He reports that he does not drink alcohol or use illicit drugs. family history includes Cancer in his sister; Heart disease in his sister; Lung cancer in his sister; Scleroderma (age of onset: 46) in his mother; Stroke (age of onset: 71) in his father. There is no history of Anesthesia problems. Allergies  Allergen Reactions  . Duac [Clindamycin Phos-Benzoyl Perox] Rash  . Contrast Media [Iodinated Diagnostic Agents] Rash    Heavy rash and itching 20 yrs ago. Needs full premeds and does well with this.  . Atorvastatin     muscle pain  . Propoxyphene N-Acetaminophen Itching    rash  . Rosuvastatin     muscle weakness  . Metrizamide Rash    radiopaque contrast agent. Heavy rash and itching 20 yrs ago. Needs full premeds and does well with this.  . Other Rash    High acid food--tomatoes, orange juice   Current Outpatient Prescriptions on File Prior to Visit  Medication Sig Dispense Refill  . albuterol (VENTOLIN HFA) 108 (90 Base) MCG/ACT inhaler INHALE 2 PUFFS INTO THE LUNGS EVERY 4 (FOUR) HOURS AS NEEDED FOR WHEEZING. 18 Inhaler 6  . calcium carbonate (TUMS EX) 750 MG chewable tablet Chew 1 tablet by mouth as needed (for gas and reflux).     . cetirizine (ZYRTEC) 10 MG tablet Take 10 mg by mouth daily as needed for allergies. Reported on 10/27/2015    . Cholecalciferol (VITAMIN D3) 1000 UNITS CAPS Take 1,000 Units by mouth daily. Reported on 09/16/2015    . clonazePAM (KLONOPIN) 1 MG tablet TAKE 1 TABLET BY MOUTH TWICE A DAY (Patient taking differently: Take 1 mg by mouth at bedtime.) 30 tablet 0  . diphenhydrAMINE (BENADRYL) 50 MG tablet Take 50mg  tablet 1 hour prior to CT scan (12pm). 1 tablet 0  . fluticasone (FLONASE) 50 MCG/ACT nasal spray Place 2 sprays into both nostrils daily. 16 g 6  . metroNIDAZOLE (METROGEL) 1 % gel Apply 1 application topically daily as  needed (to skin). Apply thin layer to affected area externally    . Multiple Vitamins-Minerals (ICAPS AREDS 2) CAPS Take 1 capsule by mouth 2 (two) times daily.     . pantoprazole (PROTONIX) 40 MG tablet Take 40 mg by mouth daily.     Marland Kitchen tiotropium (SPIRIVA HANDIHALER) 18 MCG inhalation capsule Place 1 capsule (18 mcg total) into inhaler and inhale daily. 30 capsule 6  . traMADol (ULTRAM) 50 MG tablet Take 50 mg by mouth Every 6 hours as needed for moderate pain.      No current facility-administered medications on file prior to visit.   Review of Systems Constitutional: Negative for increased diaphoresis, or other activity, appetite or siginficant weight change other than noted HENT: Negative for worsening hearing loss, ear pain, facial swelling, mouth sores and neck stiffness.  Eyes: Negative for other worsening pain, redness or visual disturbance.  Respiratory: Negative for choking or stridor Cardiovascular: Negative for other chest pain and palpitations.  Gastrointestinal: Negative for worsening diarrhea, blood in stool, or abdominal distention Genitourinary: Negative for hematuria, flank pain or change in urine volume.  Musculoskeletal: Negative for myalgias or other joint complaints.  Skin: Negative for other color change and wound or drainage.  Neurological: Negative for syncope and numbness. other than noted Hematological: Negative for adenopathy. or other swelling Psychiatric/Behavioral: Negative for hallucinations, SI, self-injury, decreased concentration or other worsening agitation.      Objective:   Physical Exam BP 124/82 mmHg  Pulse 63  Temp(Src) 98.1 F (36.7 C) (Oral)  Resp 20  Wt 166 lb (75.297 kg)  SpO2 93% VS noted,  Constitutional: Pt is oriented to person, place, and time. Appears well-developed and well-nourished, in no significant distress Head: Normocephalic and atraumatic  Eyes: Conjunctivae and EOM are normal. Pupils are equal, round, and reactive to  light Right Ear: External ear normal.  Left Ear: External ear normal Nose: Nose normal.  Mouth/Throat: Oropharynx is clear and moist  Neck: Normal range of motion. Neck supple. No JVD present. No tracheal deviation present or significant neck LA or mass Cardiovascular: Normal rate, regular rhythm, normal heart sounds and intact distal pulses.   Pulmonary/Chest: Effort normal and breath sounds without rales or wheezing  Abdominal: Soft. Bowel sounds are normal. NT. No HSM  Musculoskeletal: Normal range of motion. Exhibits no edema Lymphadenopathy: Has no cervical adenopathy.  Neurological: Pt is alert and oriented to person, place, and time. Pt has normal reflexes. No cranial nerve deficit. Motor grossly intact Skin: Skin is warm and dry. No rash noted or new ulcers Psychiatric:  Has depressed mild mood and affect. Behavior is normal.     Assessment & Plan:

## 2015-12-18 NOTE — Assessment & Plan Note (Signed)
stable overall by history and exam, recent data reviewed with pt, and pt to continue medical treatment as before,  to f/u any worsening symptoms or concerns BP Readings from Last 3 Encounters:  12/18/15 124/82  12/01/15 110/66  10/28/15 120/78

## 2015-12-19 ENCOUNTER — Encounter: Payer: Self-pay | Admitting: Internal Medicine

## 2015-12-19 ENCOUNTER — Other Ambulatory Visit: Payer: Self-pay | Admitting: Internal Medicine

## 2015-12-19 MED ORDER — LOVASTATIN 20 MG PO TABS: 20.0000 mg | ORAL_TABLET | Freq: Every day | ORAL | Status: DC

## 2015-12-19 MED ORDER — LEVOTHYROXINE SODIUM 25 MCG PO TABS: 25.0000 ug | ORAL_TABLET | Freq: Every day | ORAL | Status: DC

## 2016-01-06 ENCOUNTER — Encounter: Payer: Self-pay | Admitting: Internal Medicine

## 2016-01-06 ENCOUNTER — Ambulatory Visit (INDEPENDENT_AMBULATORY_CARE_PROVIDER_SITE_OTHER): Payer: BLUE CROSS/BLUE SHIELD | Admitting: Internal Medicine

## 2016-01-06 VITALS — BP 118/78 | HR 69 | Temp 98.3°F | Resp 20 | Wt 170.0 lb

## 2016-01-06 DIAGNOSIS — T148 Other injury of unspecified body region: Secondary | ICD-10-CM | POA: Diagnosis not present

## 2016-01-06 DIAGNOSIS — F329 Major depressive disorder, single episode, unspecified: Secondary | ICD-10-CM

## 2016-01-06 DIAGNOSIS — E039 Hypothyroidism, unspecified: Secondary | ICD-10-CM

## 2016-01-06 DIAGNOSIS — W57XXXA Bitten or stung by nonvenomous insect and other nonvenomous arthropods, initial encounter: Secondary | ICD-10-CM

## 2016-01-06 DIAGNOSIS — I10 Essential (primary) hypertension: Secondary | ICD-10-CM

## 2016-01-06 DIAGNOSIS — F32A Depression, unspecified: Secondary | ICD-10-CM

## 2016-01-06 DIAGNOSIS — R079 Chest pain, unspecified: Secondary | ICD-10-CM | POA: Diagnosis not present

## 2016-01-06 DIAGNOSIS — L089 Local infection of the skin and subcutaneous tissue, unspecified: Secondary | ICD-10-CM

## 2016-01-06 DIAGNOSIS — R0781 Pleurodynia: Secondary | ICD-10-CM | POA: Diagnosis not present

## 2016-01-06 MED ORDER — DOXYCYCLINE HYCLATE 100 MG PO TABS: 100.0000 mg | ORAL_TABLET | Freq: Two times a day (BID) | ORAL | Status: DC

## 2016-01-06 NOTE — Progress Notes (Signed)
Pre visit review using our clinic review tool, if applicable. No additional management support is needed unless otherwise documented below in the visit note. 

## 2016-01-06 NOTE — Patient Instructions (Addendum)
Your EKG was OK  Please take all new medication as prescribed - the antibiotic  Please take tylenol as needed for the rib pains  OK to re-try to the citalopram for depression, as this was VERY unlikely to be related to your face breaking out recently  Please continue all other medications as before, including the thyroid and cholesterol pills  Please have the pharmacy call with any other refills you may need.  Please keep your appointments with your specialists as you may have planned

## 2016-01-06 NOTE — Progress Notes (Signed)
Subjective:    Patient ID: Chris Lewis, male    DOB: [birth year], 75 y.o.   MRN: [id number]  HPI  Here to f/u, states still having bilat costal margin pain, ongoing for at least one month, has seen Dr Ashok Cordia, recent CTA chest done for this purpose neg for acute.  Did have rib fx early 2016, but no recent fall.  Denies worsening reflux, abd pain, dysphagia, n/v, bowel change or blood, taking the protonix and sleeps on a wedge with hx of achalasia and botox tx. Thinks it might be acid related though since he has some mouth discomfort and teeth erosions.  Pt denies chest pain, increased sob or doe, wheezing, orthopnea, PND, increased LE swelling, palpitations, dizziness or syncope, except for the above.  Pt denies new neurological symptoms such as new headache, or facial or extremity weakness or numbness   Pt denies polydipsia, polyuria,.  Also had an itch near right buttock 2 nights ago, found tick now off, but site itchy, red, raised. Has had doxy in the past and tolerated for similar. Did stop the citalopram after he "broke out on my face."  But willing to try again and Denies worsening depressive symptoms, suicidal ideation, or panic; has ongoing anxiety Past Medical History  Diagnosis Date  . Achalasia   . Hypercholesterolemia   . Seasonal allergic rhinitis   . Transient ischemic attack   . Mitral valve regurgitation   . Osteopenia   . FH: cholecystectomy   . S/P right inguinal herniorrhaphy   . MVP (mitral valve prolapse)   . MR (mitral regurgitation)   . GERD (gastroesophageal reflux disease)   . Shortness of breath     sometimes at rest  . Stroke ([place]) 10/2002  . Arthritis   . Anxiety     causes shortness  . Hiatal hernia   . Depression   . Chronic obstructive pulmonary disease ([place])   . COPD (chronic obstructive pulmonary disease) ([place])   . Lesion of right lung 06/04/2013  . Heart murmur     hx mitral valve surgery  . Pneumonia 06/04/2013    aspirated food 14   Past Surgical  History  Procedure Laterality Date  . Back surgery      x 3  . Foot surgery Left     screw -fx  . Esophagogastroduodenoscopy and savary esophageal dilation.      x9  . Eyebrow surgery  2012  . Rt inguinal herniorrhaphy  1969/1997  . Esophagoscopy w/ botox injection      x9 beginning 2008, most recent Sept. 2012  . Tee without cardioversion  07/06/2011    Procedure: TRANSESOPHAGEAL ECHOCARDIOGRAM (TEE);  Surgeon: Sueanne Margarita, MD;  Location: [facility] ENDOSCOPY;  Service: Cardiovascular;  Laterality: N/A;  . Cholecystectomy    . Mitral valve repair  08/03/2011    Procedure: MINIMALLY INVASIVE MITRAL VALVE REPAIR (MVR);  Surgeon: Rexene Alberts, MD;  Location: [place];  Service: Open Heart Surgery;  Laterality: Right;  minimally invasive MVR  . Esophagogastroduodenoscopy  01/13/2012    Procedure: ESOPHAGOGASTRODUODENOSCOPY (EGD);  Surgeon: Garlan Fair, MD;  Location: [facility] ENDOSCOPY;  Service: Endoscopy;  Laterality: N/A;  botox /katrina  . Esophagogastroduodenoscopy  06/27/2012    Procedure: ESOPHAGOGASTRODUODENOSCOPY (EGD);  Surgeon: Garlan Fair, MD;  Location: [facility] ENDOSCOPY;  Service: Endoscopy;  Laterality: N/A;  . Botox injection  06/27/2012    Procedure: BOTOX INJECTION;  Surgeon: Garlan Fair, MD;  Location: [facility] ENDOSCOPY;  Service: Endoscopy;  Laterality: N/A;  . Esophagogastroduodenoscopy (egd) with esophageal dilation N/A 11/22/2012    Procedure: ESOPHAGOGASTRODUODENOSCOPY (EGD) WITH BOTOX INJECTION;  Surgeon: Garlan Fair, MD;  Location: [facility] ENDOSCOPY;  Service: Endoscopy;  Laterality: N/A;  . Laparoscopic heller myotomy  July 2014    [facility]  . Video bronchoscopy with endobronchial navigation N/A 04/03/2014    Procedure: VIDEO BRONCHOSCOPY WITH ENDOBRONCHIAL NAVIGATION;  Surgeon: Collene Gobble, MD;  Location: [place];  Service: Thoracic;  Laterality: N/A;    reports that he quit smoking about 18 years ago. His smoking use included Cigarettes. He has a 57 pack-year smoking  history. He has never used smokeless tobacco. He reports that he does not drink alcohol or use illicit drugs. family history includes Cancer in his sister; Heart disease in his sister; Lung cancer in his sister; Scleroderma (age of onset: 68) in his mother; Stroke (age of onset: 21) in his father. There is no history of Anesthesia problems. Allergies  Allergen Reactions  . Duac [Clindamycin Phos-Benzoyl Perox] Rash  . Contrast Media [Iodinated Diagnostic Agents] Rash    Heavy rash and itching 20 yrs ago. Needs full premeds and does well with this.  . Atorvastatin     muscle pain  . Propoxyphene N-Acetaminophen Itching    rash  . Rosuvastatin     muscle weakness  . Metrizamide Rash    radiopaque contrast agent. Heavy rash and itching 20 yrs ago. Needs full premeds and does well with this.  . Other Rash    High acid food--tomatoes, orange juice   Current Outpatient Prescriptions on File Prior to Visit  Medication Sig Dispense Refill  . albuterol (VENTOLIN HFA) 108 (90 Base) MCG/ACT inhaler INHALE 2 PUFFS INTO THE LUNGS EVERY 4 (FOUR) HOURS AS NEEDED FOR WHEEZING. 18 Inhaler 6  . calcium carbonate (TUMS EX) 750 MG chewable tablet Chew 1 tablet by mouth as needed (for gas and reflux).     . cetirizine (ZYRTEC) 10 MG tablet Take 10 mg by mouth daily as needed for allergies. Reported on 10/27/2015    . Cholecalciferol (VITAMIN D3) 1000 UNITS CAPS Take 1,000 Units by mouth daily. Reported on 09/16/2015    . citalopram (CELEXA) 10 MG tablet Take 1 tablet (10 mg total) by mouth daily. 90 tablet 3  . clonazePAM (KLONOPIN) 1 MG tablet TAKE 1 TABLET BY MOUTH TWICE A DAY (Patient taking differently: Take 1 mg by mouth at bedtime.) 30 tablet 0  . diphenhydrAMINE (BENADRYL) 50 MG tablet Take 50mg  tablet 1 hour prior to CT scan (12pm). 1 tablet 0  . fluticasone (FLONASE) 50 MCG/ACT nasal spray Place 2 sprays into both nostrils daily. 16 g 6  . levothyroxine (SYNTHROID, LEVOTHROID) 25 MCG tablet Take 1  tablet (25 mcg total) by mouth daily before breakfast. 90 tablet 3  . lovastatin (MEVACOR) 20 MG tablet Take 1 tablet (20 mg total) by mouth at bedtime. 90 tablet 3  . metroNIDAZOLE (METROGEL) 1 % gel Apply 1 application topically daily as needed (to skin). Apply thin layer to affected area externally    . Multiple Vitamins-Minerals (ICAPS AREDS 2) CAPS Take 1 capsule by mouth 2 (two) times daily.     . pantoprazole (PROTONIX) 40 MG tablet Take 40 mg by mouth daily.     Marland Kitchen tiotropium (SPIRIVA HANDIHALER) 18 MCG inhalation capsule Place 1 capsule (18 mcg total) into inhaler and inhale daily. 30 capsule 6  . traMADol (ULTRAM) 50 MG tablet Take 50 mg by mouth Every 6 hours  as needed for moderate pain.      No current facility-administered medications on file prior to visit.   Review of Systems  Constitutional: Negative for unusual diaphoresis or night sweats HENT: Negative for ear swelling or discharge Eyes: Negative for worsening visual haziness  Respiratory: Negative for choking and stridor.   Gastrointestinal: Negative for distension or worsening eructation Genitourinary: Negative for retention or change in urine volume.  Musculoskeletal: Negative for other MSK pain or swelling Skin: Negative for color change and worsening wound Neurological: Negative for tremors and numbness other than noted  Psychiatric/Behavioral: Negative for decreased concentration or agitation other than above       Objective:   Physical Exam BP 118/78 mmHg  Pulse 69  Temp(Src) 98.3 F (36.8 C) (Oral)  Resp 20  Wt 170 lb (77.111 kg)  SpO2 97% VS noted,  Constitutional: Pt appears in no apparent distress HENT: Head: NCAT.  Right Ear: External ear normal.  Left Ear: External ear normal.  Eyes: . Pupils are equal, round, and reactive to light. Conjunctivae and EOM are normal Neck: Normal range of motion. Neck supple.  Cardiovascular: Normal rate and regular rhythm.   Pulmonary/Chest: Effort normal and breath  sounds without rales or wheezing.  Abd:  Soft, NT, ND, + BS with tenderness to right costal margin to palpate without swelling or rash Neurological: Pt is alert. Not confused , motor grossly intact Skin: Skin is warm. No rash but right buttock with 1-2 cm area red/tender/swelling at tick bite site, no LE edema Psychiatric: Pt behavior is normal. No agitation. + depressed affect  Most recent CT angio chest: Dec 12, 2015 IMPRESSION: 1. No pulmonary emboli or acute abnormality. 2. Stable changes of COPD and chronic bronchitis. 3. Two small right upper lobe nodules have not changed in over 2 years, compatible with a benign process. 4. Pleural and parenchymal scarring at both lung bases, including a small amount of calcified plaque at the right lung base. This can be seen with previous asbestos exposure. This may be due to previous granulomatous infection.  ECG today Sinus  Rhythm  -Right bundle branch block.   ABNORMAL      Assessment & Plan:

## 2016-01-12 ENCOUNTER — Encounter: Payer: Self-pay | Admitting: Internal Medicine

## 2016-01-12 DIAGNOSIS — E039 Hypothyroidism, unspecified: Secondary | ICD-10-CM | POA: Insufficient documentation

## 2016-01-12 HISTORY — DX: Hypothyroidism, unspecified: E03.9

## 2016-01-12 NOTE — Assessment & Plan Note (Signed)
stable overall by history and exam, recent data reviewed with pt, and pt to continue medical treatment as before,  to f/u any worsening symptoms or concerns, for fu lab soon Lab Results  Component Value Date   TSH 5.30* 12/18/2015

## 2016-01-12 NOTE — Assessment & Plan Note (Signed)
stable overall by history and exam, recent data reviewed with pt, and pt to continue medical treatment as before,  to f/u any worsening symptoms or concerns BP Readings from Last 3 Encounters:  01/06/16 118/78  12/18/15 124/82  12/01/15 110/66

## 2016-01-12 NOTE — Assessment & Plan Note (Addendum)
Mild to mod, for antibx course,  to f/u any worsening symptoms or concerns  Note:  Total time for pt hx, exam, review of record with pt in the room, determination of diagnoses and plan for further eval and tx is > 40 min, with over 50% spent in coordination and counseling of patient 

## 2016-01-12 NOTE — Assessment & Plan Note (Signed)
Mild to mod, for citalopram retry, declines referral counseling or psychiatry,m  to f/u any worsening symptoms or concerns

## 2016-01-12 NOTE — Assessment & Plan Note (Signed)
D/w pt, exam with mild symptoms and findings, for tylenol prn,  to f/u any worsening symptoms or concerns

## 2016-02-02 ENCOUNTER — Ambulatory Visit: Payer: BLUE CROSS/BLUE SHIELD | Admitting: Pulmonary Disease

## 2016-02-05 ENCOUNTER — Ambulatory Visit (INDEPENDENT_AMBULATORY_CARE_PROVIDER_SITE_OTHER): Payer: BLUE CROSS/BLUE SHIELD | Admitting: Internal Medicine

## 2016-02-05 ENCOUNTER — Encounter: Payer: Self-pay | Admitting: Internal Medicine

## 2016-02-05 VITALS — BP 122/76 | HR 65 | Temp 98.7°F | Resp 20 | Wt 171.0 lb

## 2016-02-05 DIAGNOSIS — R0781 Pleurodynia: Secondary | ICD-10-CM | POA: Diagnosis not present

## 2016-02-05 DIAGNOSIS — K220 Achalasia of cardia: Secondary | ICD-10-CM | POA: Diagnosis not present

## 2016-02-05 DIAGNOSIS — K219 Gastro-esophageal reflux disease without esophagitis: Secondary | ICD-10-CM

## 2016-02-05 MED ORDER — PANTOPRAZOLE SODIUM 40 MG PO TBEC: 40.0000 mg | DELAYED_RELEASE_TABLET | Freq: Two times a day (BID) | ORAL | Status: DC

## 2016-02-05 NOTE — Progress Notes (Signed)
Pre visit review using our clinic review tool, if applicable. No additional management support is needed unless otherwise documented below in the visit note. 

## 2016-02-05 NOTE — Assessment & Plan Note (Signed)
Pt worsening recent with frank regurgiation and symptoms uncontrolled despite PPI, gets temp mild relief with TUMS large voilume; so today for increased protonix 40 bid, also refer back toDr Mellody Memos, ? Need repeat EGD

## 2016-02-05 NOTE — Patient Instructions (Signed)
OK to increase the protonix to twice per day  (a new prescription was sent)  Please continue all other medications as before, and refills have been done if requested.  Please have the pharmacy call with any other refills you may need.  Please keep your appointments with your specialists as you may have planned  You will be contacted regarding the referral for: Dr Rogue Bussing

## 2016-02-05 NOTE — Progress Notes (Signed)
Subjective:    Patient ID: [name], male    DOB: June 29, 1941, 75 y.o.   MRN: [id number]  HPI  Here with persistent lowest sternal pain that seems to branch out along the bilat costal angles for over 1 mo, sharp usually, but still gets sometimes severe pain to the lowest sternal area, despite the Protonix 40 qd,  Was better one time with 10 TUMS but not taken since. Still has sensation of free flowing acid reflux with bad taste in mouth and teeth feeling like sandpaper.  + hx of achalasia surgury at [facility] 2014.  Denies worsening dysphagia, n/v, bowel change or blood, but did have bitter regurgiation of stomach material and old tums pills he has taken the night before.    Also Does have several wks ongoing nasal allergy symptoms with clearish congestion, itch and sneezing, without fever, pain, ST, cough, swelling or wheezing, despite the zyrtec and flonase but does not take all the time  Last EGD 2014, has seen Dr Marlowe Kays in past.  No recent wt loss and Pt denies other chest pain, increased sob or doe, wheezing, orthopnea, PND, increased LE swelling, palpitations, dizziness or syncope.  Has been seen per pulm for COPD as well. Wt Readings from Last 3 Encounters:  02/05/16 171 lb (77.565 kg)  01/06/16 170 lb (77.111 kg)  12/18/15 166 lb (75.297 kg)   Past Medical History  Diagnosis Date  . Achalasia   . Hypercholesterolemia   . Seasonal allergic rhinitis   . Transient ischemic attack   . Mitral valve regurgitation   . Osteopenia   . FH: cholecystectomy   . S/P right inguinal herniorrhaphy   . MVP (mitral valve prolapse)   . MR (mitral regurgitation)   . GERD (gastroesophageal reflux disease)   . Shortness of breath     sometimes at rest  . Stroke ([place]) 10/2002  . Arthritis   . Anxiety     causes shortness  . Hiatal hernia   . Depression   . Chronic obstructive pulmonary disease ([place])   . COPD (chronic obstructive pulmonary disease) ([place])   . Lesion of right lung  06/04/2013  . Heart murmur     hx mitral valve surgery  . Pneumonia 06/04/2013    aspirated food 14  . Hypothyroidism 01/12/2016   Past Surgical History  Procedure Laterality Date  . Back surgery      x 3  . Foot surgery Left     screw -fx  . Esophagogastroduodenoscopy and savary esophageal dilation.      x9  . Eyebrow surgery  2012  . Rt inguinal herniorrhaphy  1969/1997  . Esophagoscopy w/ botox injection      x9 beginning 2008, most recent Sept. 2012  . Tee without cardioversion  07/06/2011    Procedure: TRANSESOPHAGEAL ECHOCARDIOGRAM (TEE);  Surgeon: Sueanne Margarita, MD;  Location: [facility] ENDOSCOPY;  Service: Cardiovascular;  Laterality: N/A;  . Cholecystectomy    . Mitral valve repair  08/03/2011    Procedure: MINIMALLY INVASIVE MITRAL VALVE REPAIR (MVR);  Surgeon: Rexene Alberts, MD;  Location: [place];  Service: Open Heart Surgery;  Laterality: Right;  minimally invasive MVR  . Esophagogastroduodenoscopy  01/13/2012    Procedure: ESOPHAGOGASTRODUODENOSCOPY (EGD);  Surgeon: Garlan Fair, MD;  Location: [facility];  Service: Endoscopy;  Laterality: N/A;  botox /katrina  . Esophagogastroduodenoscopy  06/27/2012    Procedure: ESOPHAGOGASTRODUODENOSCOPY (EGD);  Surgeon: Garlan Fair, MD;  Location: [facility] ENDOSCOPY;  Service: Endoscopy;  Laterality: N/A;  . Botox injection  06/27/2012    Procedure: BOTOX INJECTION;  Surgeon: Garlan Fair, MD;  Location: [facility] ENDOSCOPY;  Service: Endoscopy;  Laterality: N/A;  . Esophagogastroduodenoscopy (egd) with esophageal dilation N/A 11/22/2012    Procedure: ESOPHAGOGASTRODUODENOSCOPY (EGD) WITH BOTOX INJECTION;  Surgeon: Garlan Fair, MD;  Location: [facility] ENDOSCOPY;  Service: Endoscopy;  Laterality: N/A;  . Laparoscopic heller myotomy  July 2014    [facility]  . Video bronchoscopy with endobronchial navigation N/A 04/03/2014    Procedure: VIDEO BRONCHOSCOPY WITH ENDOBRONCHIAL NAVIGATION;  Surgeon: Collene Gobble, MD;  Location: [place];  Service:  Thoracic;  Laterality: N/A;    reports that he quit smoking about 18 years ago. His smoking use included Cigarettes. He has a 57 pack-year smoking history. He has never used smokeless tobacco. He reports that he does not drink alcohol or use illicit drugs. family history includes Cancer in his sister; Heart disease in his sister; Lung cancer in his sister; Scleroderma (age of onset: 58) in his mother; Stroke (age of onset: 16) in his father. There is no history of Anesthesia problems. Allergies  Allergen Reactions  . Duac [Clindamycin Phos-Benzoyl Perox] Rash  . Contrast Media [Iodinated Diagnostic Agents] Rash    Heavy rash and itching 20 yrs ago. Needs full premeds and does well with this.  . Atorvastatin     muscle pain  . Propoxyphene N-Acetaminophen Itching    rash  . Rosuvastatin     muscle weakness  . Metrizamide Rash    radiopaque contrast agent. Heavy rash and itching 20 yrs ago. Needs full premeds and does well with this.  . Other Rash    High acid food--tomatoes, orange juice   Current Outpatient Prescriptions on File Prior to Visit  Medication Sig Dispense Refill  . albuterol (VENTOLIN HFA) 108 (90 Base) MCG/ACT inhaler INHALE 2 PUFFS INTO THE LUNGS EVERY 4 (FOUR) HOURS AS NEEDED FOR WHEEZING. 18 Inhaler 6  . calcium carbonate (TUMS EX) 750 MG chewable tablet Chew 1 tablet by mouth as needed (for gas and reflux).     . cetirizine (ZYRTEC) 10 MG tablet Take 10 mg by mouth daily as needed for allergies. Reported on 10/27/2015    . Cholecalciferol (VITAMIN D3) 1000 UNITS CAPS Take 1,000 Units by mouth daily. Reported on 09/16/2015    . citalopram (CELEXA) 10 MG tablet Take 1 tablet (10 mg total) by mouth daily. 90 tablet 3  . diphenhydrAMINE (BENADRYL) 50 MG tablet Take 50mg  tablet 1 hour prior to CT scan (12pm). 1 tablet 0  . fluticasone (FLONASE) 50 MCG/ACT nasal spray Place 2 sprays into both nostrils daily. 16 g 6  . levothyroxine (SYNTHROID, LEVOTHROID) 25 MCG tablet Take 1  tablet (25 mcg total) by mouth daily before breakfast. 90 tablet 3  . lovastatin (MEVACOR) 20 MG tablet Take 1 tablet (20 mg total) by mouth at bedtime. 90 tablet 3  . metroNIDAZOLE (METROGEL) 1 % gel Apply 1 application topically daily as needed (to skin). Apply thin layer to affected area externally    . Multiple Vitamins-Minerals (ICAPS AREDS 2) CAPS Take 1 capsule by mouth 2 (two) times daily.     . pantoprazole (PROTONIX) 40 MG tablet Take 40 mg by mouth daily.     Marland Kitchen tiotropium (SPIRIVA HANDIHALER) 18 MCG inhalation capsule Place 1 capsule (18 mcg total) into inhaler and inhale daily. 30 capsule 6  . traMADol (ULTRAM) 50 MG tablet Take 50 mg by mouth  Every 6 hours as needed for moderate pain.      No current facility-administered medications on file prior to visit.   Review of Systems  Constitutional: Negative for unusual diaphoresis or night sweats HENT: Negative for ear swelling or discharge Eyes: Negative for worsening visual haziness  Respiratory: Negative for choking and stridor.   Gastrointestinal: Negative for distension or worsening eructation Genitourinary: Negative for retention or change in urine volume.  Musculoskeletal: Negative for other MSK pain or swelling Skin: Negative for color change and worsening wound Neurological: Negative for tremors and numbness other than noted  Psychiatric/Behavioral: Negative for decreased concentration or agitation other than above       Objective:   Physical Exam  BP 122/76 mmHg  Pulse 65  Temp(Src) 98.7 F (37.1 C) (Oral)  Resp 20  Wt 171 lb (77.565 kg)  SpO2 96% VS noted,  Constitutional: Pt appears in no apparent distress HENT: Head: NCAT.  Right Ear: External ear normal.  Left Ear: External ear normal.  Bilat tm's with mild erythema.  Max sinus areas non tender.  Pharynx with mild erythema, no exudate Eyes: . Pupils are equal, round, and reactive to light. Conjunctivae and EOM are normal Neck: Normal range of motion. Neck  supple.  Cardiovascular: Normal rate and regular rhythm.   Pulmonary/Chest: Effort normal and breath sounds without rales or wheezing.  Abd:  Soft, NT, ND, + BS except for mild epigsatric tender There is still tender to xyphoid and bilat lower costal margins which pt states is not the pain to which he refers Neurological: Pt is alert. Not confused , motor grossly intact Skin: Skin is warm. No rash, no LE edema Psychiatric: Pt behavior is normal. No agitation.   Lab Results  Component Value Date   WBC 11.7* 12/18/2015   HGB 15.8 12/18/2015   HCT 46.5 12/18/2015   PLT 156.0 12/18/2015   GLUCOSE 100* 12/18/2015   CHOL 205* 12/18/2015   TRIG 167.0* 12/18/2015   HDL 33.80* 12/18/2015   LDLCALC 138* 12/18/2015   ALT 14 12/18/2015   AST 19 12/18/2015   NA 139 12/18/2015   K 4.3 12/18/2015   CL 101 12/18/2015   CREATININE 1.09 12/18/2015   BUN 19 12/18/2015   CO2 31 12/18/2015   TSH 5.30* 12/18/2015   PSA 1.10 12/18/2015   INR 1.06 04/01/2014   HGBA1C 6.2 12/18/2015    Apr 28,2 017 CTA chest summary IMPRESSION: 1. No pulmonary emboli or acute abnormality. 2. Stable changes of COPD and chronic bronchitis. 3. Two small right upper lobe nodules have not changed in over 2 years, compatible with a benign process. 4. Pleural and parenchymal scarring at both lung bases, including a small amount of calcified plaque at the right lung base. This can be seen with previous asbestos exposure. This may be due to previous granulomatous infection.    Assessment & Plan:

## 2016-02-05 NOTE — Assessment & Plan Note (Signed)
No dysphagia, uncertain to me how this might relate, for referral to Gi as well,  to f/u any worsening symptoms or concerns

## 2016-02-05 NOTE — Assessment & Plan Note (Signed)
Has element of persistent rib pain/costochondritis but this is not the current pain to which he c/o, for cont;d tylenol prn

## 2016-03-08 ENCOUNTER — Encounter: Payer: Self-pay | Admitting: Cardiology

## 2016-03-16 DIAGNOSIS — L820 Inflamed seborrheic keratosis: Secondary | ICD-10-CM | POA: Diagnosis not present

## 2016-03-16 DIAGNOSIS — L309 Dermatitis, unspecified: Secondary | ICD-10-CM | POA: Diagnosis not present

## 2016-03-16 DIAGNOSIS — L718 Other rosacea: Secondary | ICD-10-CM | POA: Diagnosis not present

## 2016-03-25 ENCOUNTER — Ambulatory Visit (INDEPENDENT_AMBULATORY_CARE_PROVIDER_SITE_OTHER): Payer: BLUE CROSS/BLUE SHIELD | Admitting: Cardiology

## 2016-03-25 ENCOUNTER — Encounter: Payer: Self-pay | Admitting: Cardiology

## 2016-03-25 VITALS — BP 120/78 | HR 65 | Ht 69.0 in | Wt 172.4 lb

## 2016-03-25 DIAGNOSIS — I10 Essential (primary) hypertension: Secondary | ICD-10-CM | POA: Diagnosis not present

## 2016-03-25 DIAGNOSIS — I341 Nonrheumatic mitral (valve) prolapse: Secondary | ICD-10-CM

## 2016-03-25 NOTE — Patient Instructions (Signed)

## 2016-03-25 NOTE — Progress Notes (Signed)
Cardiology Office Note    Date:  03/25/2016   ID:  Chris Lewis, Chris Lewis Dec 04, 1940, MRN [id number]  PCP:  Cathlean Cower, MD  Cardiologist:  Fransico Him, MD   Chief Complaint  Patient presents with  . 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 He is doing well. He has problems with GERD and is followed by Dr. Wynetta Emery. He denies any anginal chest pain, LE edema, dizziness, palpitations. He has some mild chronic DOE from COPD but exercises without any problems.he recently had some chest congestion and had to use his inhaler which helped and it has significantly improved.    Past Medical History:  Diagnosis Date  . Achalasia   . Anxiety    causes shortness  . Arthritis   . COPD (chronic obstructive pulmonary disease) ([place])    with chronic SOB  . Depression   . FH: cholecystectomy   . GERD (gastroesophageal reflux disease)   . Hiatal hernia   . Hypercholesterolemia   . Hypothyroidism 01/12/2016  . Lesion of right lung 06/04/2013  . MVP (mitral valve prolapse)    with severe MR s/p MV repair  . Osteopenia   . Pneumonia 06/04/2013   aspirated food 14  . S/P right inguinal herniorrhaphy   . Seasonal allergic rhinitis   . Stroke ([place]) 10/2002  . Transient ischemic attack     Past Surgical History:  Procedure Laterality Date  . BACK SURGERY     x 3  . BOTOX INJECTION  06/27/2012   Procedure: BOTOX INJECTION;  Surgeon: Garlan Fair, MD;  Location: [facility];  Service: Endoscopy;  Laterality: N/A;  . CHOLECYSTECTOMY    . ESOPHAGOGASTRODUODENOSCOPY  01/13/2012   Procedure: ESOPHAGOGASTRODUODENOSCOPY (EGD);  Surgeon: Garlan Fair, MD;  Location: [facility];  Service: Endoscopy;  Laterality: N/A;  botox /katrina  . ESOPHAGOGASTRODUODENOSCOPY  06/27/2012   Procedure:  ESOPHAGOGASTRODUODENOSCOPY (EGD);  Surgeon: Garlan Fair, MD;  Location: [facility];  Service: Endoscopy;  Laterality: N/A;  . ESOPHAGOGASTRODUODENOSCOPY (EGD) WITH ESOPHAGEAL DILATION N/A 11/22/2012   Procedure: ESOPHAGOGASTRODUODENOSCOPY (EGD) WITH BOTOX INJECTION;  Surgeon: Garlan Fair, MD;  [facility];  Service: Endoscopy;  Laterality: N/A;  . Esophagogastroduodenoscopy and Savary esophageal dilation.     x9  . ESOPHAGOSCOPY W/ BOTOX INJECTION     x9 beginning 2008, most recent Sept. 2012  . eyebrow surgery  2012  . FOOT SURGERY Left    screw -fx  . LAPAROSCOPIC HELLER MYOTOMY  July 2014   [facility]  . MITRAL VALVE REPAIR  08/03/2011   Procedure: MINIMALLY INVASIVE MITRAL VALVE REPAIR (MVR);  Surgeon: Rexene Alberts, MD;  Location: [place];  Service: Open Heart Surgery;  Laterality: Right;  minimally invasive MVR  . rt inguinal herniorrhaphy  1969/1997  . TEE WITHOUT CARDIOVERSION  07/06/2011   Procedure: TRANSESOPHAGEAL ECHOCARDIOGRAM (TEE);  Surgeon: Sueanne Margarita, MD;  Location: [place];  Service: Cardiovascular;  Laterality: N/A;  . VIDEO BRONCHOSCOPY WITH ENDOBRONCHIAL NAVIGATION N/A 04/03/2014   Procedure: VIDEO BRONCHOSCOPY WITH ENDOBRONCHIAL NAVIGATION;  Surgeon: Collene Gobble, MD;  Location: [facility];  Service: Thoracic;  Laterality: N/A;    Current Medications: Outpatient Medications Prior to Visit  Medication Sig Dispense Refill  . albuterol (VENTOLIN HFA) 108 (90  Base) MCG/ACT inhaler INHALE 2 PUFFS INTO THE LUNGS EVERY 4 (FOUR) HOURS AS NEEDED FOR WHEEZING. 18 Inhaler 6  . calcium carbonate (TUMS EX) 750 MG chewable tablet Chew 1 tablet by mouth as needed (for gas and reflux).     . cetirizine (ZYRTEC) 10 MG tablet Take 10 mg by mouth daily as needed for allergies. Reported on 10/27/2015    . Cholecalciferol (VITAMIN D3) 1000 UNITS CAPS Take 1,000 Units by mouth daily. Reported on 09/16/2015    . citalopram (CELEXA) 10 MG tablet Take 1 tablet (10 mg  total) by mouth daily. 90 tablet 3  . diphenhydrAMINE (BENADRYL) 50 MG tablet Take 50mg  tablet 1 hour prior to CT scan (12pm). 1 tablet 0  . fluticasone (FLONASE) 50 MCG/ACT nasal spray Place 2 sprays into both nostrils daily. 16 g 6  . levothyroxine (SYNTHROID, LEVOTHROID) 25 MCG tablet Take 1 tablet (25 mcg total) by mouth daily before breakfast. 90 tablet 3  . lovastatin (MEVACOR) 20 MG tablet Take 1 tablet (20 mg total) by mouth at bedtime. 90 tablet 3  . metroNIDAZOLE (METROGEL) 1 % gel Apply 1 application topically daily as needed (to skin). Apply thin layer to affected area externally    . Multiple Vitamins-Minerals (ICAPS AREDS 2) CAPS Take 1 capsule by mouth 2 (two) times daily.     . pantoprazole (PROTONIX) 40 MG tablet Take 1 tablet (40 mg total) by mouth 2 (two) times daily before a meal. 60 tablet 11  . tiotropium (SPIRIVA HANDIHALER) 18 MCG inhalation capsule Place 1 capsule (18 mcg total) into inhaler and inhale daily. 30 capsule 6  . traMADol (ULTRAM) 50 MG tablet Take 50 mg by mouth Every 6 hours as needed for moderate pain.      No facility-administered medications prior to visit.      Allergies:   Duac [clindamycin phos-benzoyl perox]; Contrast media [iodinated diagnostic agents]; Atorvastatin; Propoxyphene n-acetaminophen; Rosuvastatin; Metrizamide; and Other   Social History   Social History  . Marital status: Married    Spouse name: N/A  . Number of children: 2  . Years of education: N/A   Occupational History  .  Retired   Social History Main Topics  . Smoking status: Former Smoker    Packs/day: 1.50    Years: 38.00    Types: Cigarettes    Quit date: 08/29/1997  . Smokeless tobacco: Never Used  . Alcohol use No  . Drug use: No  . Sexual activity: Not Asked   Other Topics Concern  . None   Social History Narrative  . None     Family History:  The patient's family history includes Cancer in his sister; Heart disease in his sister; Lung cancer in his  sister; Scleroderma (age of onset: 52) in his mother; Stroke (age of onset: 29) in his father.   ROS:   Please see the history of present illness.    ROS All other systems reviewed and are negative.   PHYSICAL EXAM:   VS:  BP 120/78   Pulse 65   Ht 5\' 9"  (1.753 m)   Wt 172 lb 6.4 oz (78.2 kg)   SpO2 95%   BMI 25.46 kg/m    GEN: Well nourished, well developed, in no acute distress  HEENT: normal  Neck: no JVD, carotid bruits, or masses Cardiac: RRR; no murmurs, rubs, or gallops,no edema.  Intact distal pulses bilaterally.  Respiratory:  clear to auscultation bilaterally, normal work of breathing GI: soft, nontender, nondistended, +  BS MS: no deformity or atrophy  Skin: warm and dry, no rash Neuro:  Alert and Oriented x 3, Strength and sensation are intact Psych: euthymic mood, full affect  Wt Readings from Last 3 Encounters:  03/25/16 172 lb 6.4 oz (78.2 kg)  02/05/16 171 lb (77.6 kg)  01/06/16 170 lb (77.1 kg)      Studies/Labs Reviewed:   EKG:  EKG is not ordered today.    Recent Labs: 12/18/2015: ALT 14; BUN 19; Creatinine, Ser 1.09; Hemoglobin 15.8; Platelets 156.0; Potassium 4.3; Sodium 139; TSH 5.30   Lipid Panel    Component Value Date/Time   CHOL 205 (H) 12/18/2015 1617   TRIG 167.0 (H) 12/18/2015 1617   HDL 33.80 (L) 12/18/2015 1617   CHOLHDL 6 12/18/2015 1617   VLDL 33.4 12/18/2015 1617   LDLCALC 138 (H) 12/18/2015 1617    Additional studies/ records that were reviewed today include:  none    ASSESSMENT:    1. MVP (mitral valve prolapse)   2. HYPERTENSION, BENIGN      PLAN:  In order of problems listed above:  1. MVP with severe MR s/p MV repair. He is doing well without any complaints.  I will check a 2D echo to reassess his MV since he has not had an echo in almost 5 years. 2. HTN - BP controlled.    Medication Adjustments/Labs and Tests Ordered: Current medicines are reviewed at length with the patient today.  Concerns regarding  medicines are outlined above.  Medication changes, Labs and Tests ordered today are listed in the Patient Instructions below.  There are no Patient Instructions on file for this visit.   Signed, Fransico Him, MD  03/25/2016 2:42 PM    [facility] [address], [place], [place]  [address] Phone: [phone]; Fax: [phone]

## 2016-04-02 ENCOUNTER — Ambulatory Visit (HOSPITAL_COMMUNITY): Payer: BLUE CROSS/BLUE SHIELD | Attending: Cardiology

## 2016-04-02 ENCOUNTER — Encounter: Payer: Self-pay | Admitting: Cardiology

## 2016-04-02 ENCOUNTER — Other Ambulatory Visit: Payer: Self-pay

## 2016-04-02 ENCOUNTER — Telehealth: Payer: Self-pay

## 2016-04-02 DIAGNOSIS — I77810 Thoracic aortic ectasia: Secondary | ICD-10-CM | POA: Insufficient documentation

## 2016-04-02 DIAGNOSIS — I071 Rheumatic tricuspid insufficiency: Secondary | ICD-10-CM | POA: Insufficient documentation

## 2016-04-02 DIAGNOSIS — I341 Nonrheumatic mitral (valve) prolapse: Secondary | ICD-10-CM | POA: Diagnosis present

## 2016-04-02 DIAGNOSIS — I517 Cardiomegaly: Secondary | ICD-10-CM | POA: Diagnosis not present

## 2016-04-02 DIAGNOSIS — I340 Nonrheumatic mitral (valve) insufficiency: Secondary | ICD-10-CM | POA: Diagnosis not present

## 2016-04-02 NOTE — Telephone Encounter (Signed)
Informed patient of results and verbal understanding expressed.  Repeat ECHO ordered to be scheduled in 1 year. Patient agrees with treatment plan. 

## 2016-04-02 NOTE — Telephone Encounter (Signed)
-----   Message from Sueanne Margarita, MD sent at 04/02/2016  4:25 PM EDT ----- Echo showed normal LVF with increased stiffness of heart muscle, mildly dilated aortic root, stable MV repair with trivial MR, moderately dilated RV/RA and mild TR - repeat echo in 1 year for dilated aortic root

## 2016-04-13 ENCOUNTER — Other Ambulatory Visit: Payer: Self-pay | Admitting: Internal Medicine

## 2016-04-13 NOTE — Telephone Encounter (Signed)
Done hardcopy to Corinne  

## 2016-04-14 NOTE — Telephone Encounter (Signed)
Medication refill sent to pharmacy  

## 2016-04-29 DIAGNOSIS — Z23 Encounter for immunization: Secondary | ICD-10-CM | POA: Diagnosis not present

## 2016-05-25 ENCOUNTER — Ambulatory Visit (INDEPENDENT_AMBULATORY_CARE_PROVIDER_SITE_OTHER): Payer: BLUE CROSS/BLUE SHIELD | Admitting: Internal Medicine

## 2016-05-25 ENCOUNTER — Encounter: Payer: Self-pay | Admitting: Internal Medicine

## 2016-05-25 ENCOUNTER — Other Ambulatory Visit (INDEPENDENT_AMBULATORY_CARE_PROVIDER_SITE_OTHER): Payer: BLUE CROSS/BLUE SHIELD

## 2016-05-25 ENCOUNTER — Ambulatory Visit (INDEPENDENT_AMBULATORY_CARE_PROVIDER_SITE_OTHER)
Admission: RE | Admit: 2016-05-25 | Discharge: 2016-05-25 | Disposition: A | Discharge status: Home / Self Care | Payer: BLUE CROSS/BLUE SHIELD | Source: Ambulatory Visit | Attending: Internal Medicine | Admitting: Internal Medicine

## 2016-05-25 VITALS — BP 130/70 | HR 83 | Temp 98.9°F | Resp 20 | Wt 167.4 lb

## 2016-05-25 DIAGNOSIS — R059 Cough, unspecified: Secondary | ICD-10-CM

## 2016-05-25 DIAGNOSIS — R05 Cough: Secondary | ICD-10-CM

## 2016-05-25 DIAGNOSIS — Z0001 Encounter for general adult medical examination with abnormal findings: Secondary | ICD-10-CM

## 2016-05-25 DIAGNOSIS — J449 Chronic obstructive pulmonary disease, unspecified: Secondary | ICD-10-CM

## 2016-05-25 DIAGNOSIS — E039 Hypothyroidism, unspecified: Secondary | ICD-10-CM

## 2016-05-25 DIAGNOSIS — J019 Acute sinusitis, unspecified: Secondary | ICD-10-CM

## 2016-05-25 LAB — T4, FREE: FREE T4: 0.91 ng/dL (ref 0.60–1.60)

## 2016-05-25 LAB — TSH: TSH: 4.26 u[IU]/mL (ref 0.35–4.50)

## 2016-05-25 MED ORDER — AZITHROMYCIN 250 MG PO TABS: ORAL_TABLET | ORAL | 1 refills | Status: DC

## 2016-05-25 MED ORDER — HYDROCODONE-HOMATROPINE 5-1.5 MG/5ML PO SYRP: 5.0000 mL | ORAL_SOLUTION | Freq: Four times a day (QID) | ORAL | 0 refills | Status: DC | PRN

## 2016-05-25 NOTE — Progress Notes (Signed)
Pre visit review using our clinic review tool, if applicable. No additional management support is needed unless otherwise documented below in the visit note. 

## 2016-05-25 NOTE — Progress Notes (Signed)
Subjective:    Patient ID: Chris Lewis, male    DOB: 1941-02-27, 75 y.o.   MRN: [id number]  HPI   Here with 2-3 days acute onset fever, facial pain, pressure, headache, general weakness and malaise, and greenish d/c, with mild ST and frequent scant prod cough with HA, but pt denies chest pain, wheezing, increased sob or doe, orthopnea, PND, increased LE swelling, palpitations, dizziness or syncope.  Denies hyper or hypo thyroid symptoms such as voice, skin or hair change.  Pt denies polydipsia, polyuria Past Medical History:  Diagnosis Date  . Achalasia   . Anxiety    causes shortness  . Arthritis   . COPD (chronic obstructive pulmonary disease) ([place])    with chronic SOB  . Depression   . Dilated aortic root ([place])    79mm by echo 03/2016  . FH: cholecystectomy   . GERD (gastroesophageal reflux disease)   . Hiatal hernia   . Hypercholesterolemia   . Hypothyroidism 01/12/2016  . Lesion of right lung 06/04/2013  . MVP (mitral valve prolapse)    with severe MR s/p MV repair  . Osteopenia   . Pneumonia 06/04/2013   aspirated food 14  . S/P right inguinal herniorrhaphy   . Seasonal allergic rhinitis   . Stroke ([place]) 10/2002  . Transient ischemic attack    Past Surgical History:  Procedure Laterality Date  . BACK SURGERY     x 3  . BOTOX INJECTION  06/27/2012   Procedure: BOTOX INJECTION;  Surgeon: Garlan Fair, MD;  [facility];  Service: Endoscopy;  Laterality: N/A;  . CHOLECYSTECTOMY    . ESOPHAGOGASTRODUODENOSCOPY  01/13/2012   Procedure: ESOPHAGOGASTRODUODENOSCOPY (EGD);  Surgeon: Garlan Fair, MD;  Location: [facility];  Service: Endoscopy;  Laterality: N/A;  botox /katrina  . ESOPHAGOGASTRODUODENOSCOPY  06/27/2012   Procedure: ESOPHAGOGASTRODUODENOSCOPY (EGD);  Surgeon: Garlan Fair, MD;  Location: [facility];  Service: Endoscopy;  Laterality: N/A;  . ESOPHAGOGASTRODUODENOSCOPY (EGD) WITH ESOPHAGEAL DILATION N/A 11/22/2012   Procedure:  ESOPHAGOGASTRODUODENOSCOPY (EGD) WITH BOTOX INJECTION;  Surgeon: Garlan Fair, MD;  [facility];  Service: Endoscopy;  Laterality: N/A;  . Esophagogastroduodenoscopy and Savary esophageal dilation.     x9  . ESOPHAGOSCOPY W/ BOTOX INJECTION     x9 beginning 2008, most recent Sept. 2012  . eyebrow surgery  2012  . FOOT SURGERY Left    screw -fx  . LAPAROSCOPIC HELLER MYOTOMY  July 2014   [facility]  . MITRAL VALVE REPAIR  08/03/2011   Procedure: MINIMALLY INVASIVE MITRAL VALVE REPAIR (MVR);  Surgeon: Rexene Alberts, MD;  Location: [place];  Service: Open Heart Surgery;  Laterality: Right;  minimally invasive MVR  . rt inguinal herniorrhaphy  1969/1997  . TEE WITHOUT CARDIOVERSION  07/06/2011   Procedure: TRANSESOPHAGEAL ECHOCARDIOGRAM (TEE);  Surgeon: Sueanne Margarita, MD;  Location: [place];  Service: Cardiovascular;  Laterality: N/A;  . VIDEO BRONCHOSCOPY WITH ENDOBRONCHIAL NAVIGATION N/A 04/03/2014   Procedure: VIDEO BRONCHOSCOPY WITH ENDOBRONCHIAL NAVIGATION;  Surgeon: Collene Gobble, MD;  Location: [place];  Service: Thoracic;  Laterality: N/A;    reports that he quit smoking about 18 years ago. His smoking use included Cigarettes. He has a 57.00 pack-year smoking history. He has never used smokeless tobacco. He reports that he does not drink alcohol or use drugs. family history includes Cancer in his sister; Heart disease in his sister; Lung cancer in his sister; Scleroderma (age of onset: 63) in his mother; Stroke (age  of onset: 44) in his father. Allergies  Allergen Reactions  . Duac [Clindamycin Phos-Benzoyl Perox] Rash  . Contrast Media [Iodinated Diagnostic Agents] Rash    Heavy rash and itching 20 yrs ago. Needs full premeds and does well with this.  . Atorvastatin     muscle pain  . Propoxyphene N-Acetaminophen Itching    rash  . Rosuvastatin     muscle weakness  . Metrizamide Rash    radiopaque contrast agent. Heavy rash and itching 20 yrs ago. Needs full  premeds and does well with this.  . Other Rash    High acid food--tomatoes, orange juice   Current Outpatient Prescriptions on File Prior to Visit  Medication Sig Dispense Refill  . albuterol (VENTOLIN HFA) 108 (90 Base) MCG/ACT inhaler INHALE 2 PUFFS INTO THE LUNGS EVERY 4 (FOUR) HOURS AS NEEDED FOR WHEEZING. 18 Inhaler 6  . calcium carbonate (TUMS EX) 750 MG chewable tablet Chew 1 tablet by mouth as needed (for gas and reflux).     . cetirizine (ZYRTEC) 10 MG tablet Take 10 mg by mouth daily as needed for allergies. Reported on 10/27/2015    . Cholecalciferol (VITAMIN D3) 1000 UNITS CAPS Take 1,000 Units by mouth daily. Reported on 09/16/2015    . citalopram (CELEXA) 10 MG tablet Take 1 tablet (10 mg total) by mouth daily. 90 tablet 3  . diphenhydrAMINE (BENADRYL) 50 MG tablet Take 50mg  tablet 1 hour prior to CT scan (12pm). 1 tablet 0  . fluticasone (FLONASE) 50 MCG/ACT nasal spray Place 2 sprays into both nostrils daily. 16 g 6  . levothyroxine (SYNTHROID, LEVOTHROID) 25 MCG tablet Take 1 tablet (25 mcg total) by mouth daily before breakfast. 90 tablet 3  . lovastatin (MEVACOR) 20 MG tablet Take 1 tablet (20 mg total) by mouth at bedtime. 90 tablet 3  . metroNIDAZOLE (METROGEL) 1 % gel Apply 1 application topically daily as needed (to skin). Apply thin layer to affected area externally    . Multiple Vitamins-Minerals (ICAPS AREDS 2) CAPS Take 1 capsule by mouth 2 (two) times daily.     . pantoprazole (PROTONIX) 40 MG tablet Take 1 tablet (40 mg total) by mouth 2 (two) times daily before a meal. 60 tablet 11  . tiotropium (SPIRIVA HANDIHALER) 18 MCG inhalation capsule Place 1 capsule (18 mcg total) into inhaler and inhale daily. 30 capsule 6  . traMADol (ULTRAM) 50 MG tablet TAKE 1 TABLET BY MOUTH EVERY 6 HOURS AS NEEDED 60 tablet 1   No current facility-administered medications on file prior to visit.    Review of Systems  Constitutional: Negative for unusual diaphoresis or night  sweats HENT: Negative for ear swelling or discharge Eyes: Negative for worsening visual haziness  Respiratory: Negative for choking and stridor.   Gastrointestinal: Negative for distension or worsening eructation Genitourinary: Negative for retention or change in urine volume.  Musculoskeletal: Negative for other MSK pain or swelling Skin: Negative for color change and worsening wound Neurological: Negative for tremors and numbness other than noted  Psychiatric/Behavioral: Negative for decreased concentration or agitation other than above       Objective:   Physical Exam BP 130/70   Pulse 83   Temp 98.9 F (37.2 C) (Oral)   Resp 20   Wt 167 lb 6 oz (75.9 kg)   SpO2 96%   BMI 24.72 kg/m  VS noted, mild ill HENT: Head: NCAT.  Right Ear: External ear normal.  Left Ear: External ear normal.  Bilat tm's with  mild erythema.  Max sinus areas mild tender.  Pharynx with mild erythema, no exudate Eyes: . Pupils are equal, round, and reactive to light. Conjunctivae and EOM are normal Neck: Normal range of motion. Neck supple.  Cardiovascular: Normal rate and regular rhythm.   Pulmonary/Chest: Effort normal and breath sounds decreased bilat without rales or wheezing.  Neurological: Pt is alert. Not confused , motor grossly intact Skin: Skin is warm. No rash, no LE edema Psychiatric: Pt behavior is normal. No agitation.     Assessment & Plan:

## 2016-05-25 NOTE — Patient Instructions (Addendum)
Please take all new medication as prescribed - the antibiotic, and cough medicine if needed  You can also take Mucinex (or it's generic off brand) for congestion, and tylenol as needed for pain.  Please continue all other medications as before, and refills have been done if requested.  Please have the pharmacy call with any other refills you may need.  Please keep your appointments with your specialists as you may have planned  Please go to the XRAY Department in the Basement (go straight as you get off the elevator) for the x-ray testing  Please go to the LAB in the Basement (turn left off the elevator) for the tests to be done today  You will be contacted by phone if any changes need to be made immediately.  Otherwise, you will receive a letter about your results with an explanation, but please check with MyChart first.  Please remember to sign up for MyChart if you have not done so, as this will be important to you in the future with finding out test results, communicating by private email, and scheduling acute appointments online when needed.

## 2016-05-29 DIAGNOSIS — J019 Acute sinusitis, unspecified: Secondary | ICD-10-CM | POA: Insufficient documentation

## 2016-05-29 NOTE — Assessment & Plan Note (Signed)
Mild to mod, cant rule out bronchitis vs pna, for cxr, for antibx course,  to f/u any worsening symptoms or concerns

## 2016-05-29 NOTE — Assessment & Plan Note (Signed)
stable overall by history and exam, recent data reviewed with pt, and pt to continue medical treatment as before,  to f/u any worsening symptoms or concerns Lab Results  Component Value Date   TSH 4.26 05/25/2016

## 2016-05-29 NOTE — Assessment & Plan Note (Signed)
stable overall by history and exam, recent data reviewed with pt, and pt to continue medical treatment as before,  to f/u any worsening symptoms or concerns @LASTSAO2(3)@  

## 2016-05-29 NOTE — Assessment & Plan Note (Signed)
Mild to mod, for antibx course,  to f/u any worsening symptoms or concerns 

## 2016-05-31 ENCOUNTER — Telehealth: Payer: Self-pay | Admitting: Internal Medicine

## 2016-05-31 NOTE — Telephone Encounter (Signed)
[facility]  Patient Name: Chris Lewis  DOB: 09/02/1940    Initial Comment Caller states c/o coughing, nasal congestion, chest and rib soreness from coughing and leg weakness. He finished Z-pack on Saturday. He also states c/o rash on face when he used hydrocodone cough syrup but it is clearing up.   Nurse Assessment  Nurse: Harlow Mares, RN, Suanne Marker Date/Time (Eastern Time): 05/31/2016 10:54:30 AM  Confirm and document reason for call. If symptomatic, describe symptoms. You must click the next button to save text entered. ---Caller states c/o coughing, nasal congestion, chest and rib soreness from coughing and leg weakness. He finished Z-pack on Saturday (resp infection). He also states c/o rash on face when he used hydrocodone cough syrup but it is clearing up. Denies fever.  Has the patient traveled out of the country within the last 30 days? ---Not Applicable  Does the patient have any new or worsening symptoms? ---Yes  Will a triage be completed? ---Yes  Related visit to physician within the last 2 weeks? ---Yes  Does the PT have any chronic conditions? (i.e. diabetes, asthma, etc.) ---Yes  List chronic conditions. ---COPD;  Is this a behavioral health or substance abuse call? ---No     Guidelines    Guideline Title Affirmed Question Affirmed Notes  Infection on Antibiotic Follow-up Call [1] Finished taking antibiotics AND [2] symptoms are BETTER but [3] not completely gone    Final Disposition User   See PCP When Office is Open (within 3 days) Harlow Mares, RN, Suanne Marker    Comments  Appt scheduled with Dr. Sharlet Salina for [place] 06/01/16 @ 2pm at the [facility] office.   Referrals  REFERRED TO PCP OFFICE   Disagree/Comply: Comply

## 2016-06-01 ENCOUNTER — Ambulatory Visit (INDEPENDENT_AMBULATORY_CARE_PROVIDER_SITE_OTHER): Payer: BLUE CROSS/BLUE SHIELD | Admitting: Internal Medicine

## 2016-06-01 ENCOUNTER — Encounter: Payer: Self-pay | Admitting: Internal Medicine

## 2016-06-01 DIAGNOSIS — J441 Chronic obstructive pulmonary disease with (acute) exacerbation: Secondary | ICD-10-CM | POA: Diagnosis not present

## 2016-06-01 MED ORDER — PREDNISONE 20 MG PO TABS: 40.0000 mg | ORAL_TABLET | Freq: Every day | ORAL | 0 refills | Status: DC

## 2016-06-01 MED ORDER — BENZONATATE 100 MG PO CAPS: 100.0000 mg | ORAL_CAPSULE | Freq: Two times a day (BID) | ORAL | 0 refills | Status: DC | PRN

## 2016-06-01 NOTE — Progress Notes (Signed)
   Subjective:    Patient ID: [name], male    DOB: [birth year], 75 y.o.   MRN: [id number]  HPI The patient is a 75 YO man coming in for cough and SOB. He does have COPD and was recently treated with azithromycin and hycodan. He did feel better while taking the antibiotics but after stopping his symptoms worsened again. Using albuterol more and having a hard time breathing at night as well. He is coughing and still productive of yellow to white sputum (previously dark yellow). He has been around additional sick contacts since last visit. No fevers or chills. No sinus congestion or drainage. He is taking zyrtec and flonase regularly.   Review of Systems  Constitutional: Positive for activity change. Negative for appetite change, chills, fatigue, fever and unexpected weight change.  HENT: Positive for postnasal drip. Negative for congestion, dental problem, ear discharge, ear pain, rhinorrhea, sinus pressure, sore throat and trouble swallowing.   Eyes: Negative.   Respiratory: Positive for cough and shortness of breath. Negative for chest tightness and wheezing.   Cardiovascular: Negative for chest pain, palpitations and leg swelling.  Gastrointestinal: Negative.   Musculoskeletal: Negative.       Objective:   Physical Exam  Constitutional: He appears well-developed and well-nourished.  HENT:  Head: Normocephalic and atraumatic.  Right Ear: External ear normal.  Left Ear: External ear normal.  Nose: Nose normal.  Mouth/Throat: Oropharynx is clear and moist.  Eyes: EOM are normal.  Neck: Normal range of motion.  Cardiovascular: Normal rate and regular rhythm.   Pulmonary/Chest: Effort normal. No respiratory distress. He has wheezes. He has no rales. He exhibits no tenderness.  Expiratory wheezing bilateral which partially clears with coughing.   Abdominal: Soft.  Skin: Skin is warm and dry.   Vitals:   06/01/16 1404  BP: 118/68  Pulse: (!) 53  Resp: 16  Temp: 98.1 F (36.7 C)   TempSrc: Oral  SpO2: 94%  Weight: 167 lb (75.8 kg)  Height: 5\' 9"  (1.753 m)      Assessment & Plan:

## 2016-06-01 NOTE — Assessment & Plan Note (Signed)
Rx for prednisone and tessalon perles and he is encouraged not to take cough syrup with codeine in the future. If no improvement recommend call back or return at end of prednisone. Likely re-infection with new viral pathogen from his wife which has caused his COPD to have continued exacerbation.

## 2016-06-01 NOTE — Progress Notes (Signed)
Pre visit review using our clinic review tool, if applicable. No additional management support is needed unless otherwise documented below in the visit note. 

## 2016-06-01 NOTE — Patient Instructions (Signed)
We have sent in prednisone for the lungs. Take 2 pills daily for 5 days.  We have also sent in cough medicine called tessalon perles. You can take 1 pill up to 3 times per day for cough as needed.   If you are not feeling better call the office back.

## 2016-08-04 ENCOUNTER — Encounter: Payer: Self-pay | Admitting: Adult Health

## 2016-08-04 ENCOUNTER — Ambulatory Visit (INDEPENDENT_AMBULATORY_CARE_PROVIDER_SITE_OTHER): Payer: BLUE CROSS/BLUE SHIELD | Admitting: Adult Health

## 2016-08-04 ENCOUNTER — Other Ambulatory Visit: Payer: BLUE CROSS/BLUE SHIELD

## 2016-08-04 DIAGNOSIS — J301 Allergic rhinitis due to pollen: Secondary | ICD-10-CM

## 2016-08-04 DIAGNOSIS — J449 Chronic obstructive pulmonary disease, unspecified: Secondary | ICD-10-CM | POA: Diagnosis not present

## 2016-08-04 LAB — NITRIC OXIDE: Nitric Oxide: 43

## 2016-08-04 MED ORDER — BUDESONIDE-FORMOTEROL FUMARATE 160-4.5 MCG/ACT IN AERO: 2.0000 | INHALATION_SPRAY | Freq: Two times a day (BID) | RESPIRATORY_TRACT | 0 refills | Status: DC

## 2016-08-04 MED ORDER — BUDESONIDE-FORMOTEROL FUMARATE 160-4.5 MCG/ACT IN AERO: 2.0000 | INHALATION_SPRAY | Freq: Two times a day (BID) | RESPIRATORY_TRACT | 12 refills | Status: DC

## 2016-08-04 MED ORDER — MONTELUKAST SODIUM 10 MG PO TABS: 10.0000 mg | ORAL_TABLET | Freq: Every day | ORAL | 11 refills | Status: DC

## 2016-08-04 MED ORDER — AZITHROMYCIN 250 MG PO TABS: 250.0000 mg | ORAL_TABLET | Freq: Every day | ORAL | 0 refills | Status: DC

## 2016-08-04 MED ORDER — PREDNISONE 10 MG PO TABS: ORAL_TABLET | ORAL | 0 refills | Status: DC

## 2016-08-04 MED ORDER — LEVALBUTEROL HCL 0.63 MG/3ML IN NEBU
0.6300 mg | INHALATION_SOLUTION | Freq: Once | RESPIRATORY_TRACT | Status: AC
  Administered 2016-08-04: 0.63 mg via RESPIRATORY_TRACT

## 2016-08-04 MED ORDER — BUDESONIDE-FORMOTEROL FUMARATE 160-4.5 MCG/ACT IN AERO: 2.0000 | INHALATION_SPRAY | Freq: Two times a day (BID) | RESPIRATORY_TRACT | 6 refills | Status: DC

## 2016-08-04 NOTE — Addendum Note (Signed)
Addended by: Mathis Bud on: 08/04/2016 01:01 PM   Modules accepted: Orders

## 2016-08-04 NOTE — Addendum Note (Signed)
Addended by: Mathis Bud on: 08/04/2016 10:59 AM   Modules accepted: Orders

## 2016-08-04 NOTE — Progress Notes (Signed)
@Patient  ID: [name], male    DOB: 11/25/1940, 75 y.o.   MRN: [id number]  Chief Complaint  Patient presents with  . Acute Visit    cough     Referring provider: Biagio Borg, MD     HPI: 75 yo male former smoker with COPD -GOLD C , Right lung nodule and Chronic microaspiration d/t achalasia.   08/04/2016 Acute OV  Pt presents for an acute office visit. Complains of cough , congestion , drainage , and wheezing . Lots of sinus drainage for last 4 days . Taking zyrtec and flonase . Without much help. Coughing thick mucus . Wheezing and dyspnea are worse for last 2 days.  Remains on Spiriva daily .  Reflux doing okay , no recent nv/d.  Exhaled nitric oxide 44 today .   Allergies  Allergen Reactions  . Duac [Clindamycin Phos-Benzoyl Perox] Rash  . Contrast Media [Iodinated Diagnostic Agents] Rash    Heavy rash and itching 20 yrs ago. Needs full premeds and does well with this.  . Atorvastatin     muscle pain  . Propoxyphene N-Acetaminophen Itching    rash  . Rosuvastatin     muscle weakness  . Metrizamide Rash    radiopaque contrast agent. Heavy rash and itching 20 yrs ago. Needs full premeds and does well with this.  . Other Rash    High acid food--tomatoes, orange juice    Immunization History  Administered Date(s) Administered  . Influenza Split 05/16/2012  . Influenza Whole 05/16/2009, 04/21/2010, 05/11/2011  . Influenza,inj,Quad PF,36+ Mos 04/25/2013, 04/24/2014, 04/24/2015  . Influenza-Unspecified 04/29/2016  . Pneumococcal Conjugate-13 09/16/2015  . Pneumococcal Polysaccharide-23 04/24/2014    Past Medical History:  Diagnosis Date  . Achalasia   . Anxiety    causes shortness  . Arthritis   . COPD (chronic obstructive pulmonary disease) ([place])    with chronic SOB  . Depression   . Dilated aortic root ([place])    88mm by echo 03/2016  . FH: cholecystectomy   . GERD (gastroesophageal reflux disease)   . Hiatal hernia   . Hypercholesterolemia   .  Hypothyroidism 01/12/2016  . Lesion of right lung 06/04/2013  . MVP (mitral valve prolapse)    with severe MR s/p MV repair  . Osteopenia   . Pneumonia 06/04/2013   aspirated food 14  . S/P right inguinal herniorrhaphy   . Seasonal allergic rhinitis   . Stroke ([place]) 10/2002  . Transient ischemic attack     Tobacco History: History  Smoking Status  . Former Smoker  . Packs/day: 1.50  . Years: 38.00  . Types: Cigarettes  . Quit date: 08/29/1997  Smokeless Tobacco  . Never Used   Counseling given: Not Answered   Outpatient Encounter Prescriptions as of 08/04/2016  Medication Sig  . albuterol (VENTOLIN HFA) 108 (90 Base) MCG/ACT inhaler INHALE 2 PUFFS INTO THE LUNGS EVERY 4 (FOUR) HOURS AS NEEDED FOR WHEEZING.  . benzonatate (TESSALON) 100 MG capsule Take 1 capsule (100 mg total) by mouth 2 (two) times daily as needed for cough.  . calcium carbonate (TUMS EX) 750 MG chewable tablet Chew 1 tablet by mouth as needed (for gas and reflux).   . cetirizine (ZYRTEC) 10 MG tablet Take 10 mg by mouth daily as needed for allergies. Reported on 10/27/2015  . Cholecalciferol (VITAMIN D3) 1000 UNITS CAPS Take 1,000 Units by mouth daily. Reported on 09/16/2015  . citalopram (CELEXA) 10 MG tablet Take 1 tablet (10 mg  total) by mouth daily.  . fluticasone (FLONASE) 50 MCG/ACT nasal spray Place 2 sprays into both nostrils daily.  Marland Kitchen levothyroxine (SYNTHROID, LEVOTHROID) 25 MCG tablet Take 1 tablet (25 mcg total) by mouth daily before breakfast.  . lovastatin (MEVACOR) 20 MG tablet Take 1 tablet (20 mg total) by mouth at bedtime.  . metroNIDAZOLE (METROGEL) 1 % gel Apply 1 application topically daily as needed (to skin). Apply thin layer to affected area externally  . Multiple Vitamins-Minerals (ICAPS AREDS 2) CAPS Take 1 capsule by mouth 2 (two) times daily.   . pantoprazole (PROTONIX) 40 MG tablet Take 1 tablet (40 mg total) by mouth 2 (two) times daily before a meal.  . predniSONE (DELTASONE) 20 MG  tablet Take 2 tablets (40 mg total) by mouth daily with breakfast.  . tiotropium (SPIRIVA HANDIHALER) 18 MCG inhalation capsule Place 1 capsule (18 mcg total) into inhaler and inhale daily.  . traMADol (ULTRAM) 50 MG tablet TAKE 1 TABLET BY MOUTH EVERY 6 HOURS AS NEEDED   No facility-administered encounter medications on file as of 08/04/2016.      Review of Systems  Constitutional:   No  weight loss, night sweats,  Fevers, chills, fatigue, or  lassitude.  HEENT:   No headaches,  Difficulty swallowing,  Tooth/dental problems, or  Sore throat,                No sneezing, itching, ear ache,  +nasal congestion, post nasal drip,   CV:  No chest pain,  Orthopnea, PND, swelling in lower extremities, anasarca, dizziness, palpitations, syncope.   GI  No heartburn, indigestion, abdominal pain, nausea, vomiting, diarrhea, change in bowel habits, loss of appetite, bloody stools.   Resp:  .  No chest wall deformity  Skin: no rash or lesions.  GU: no dysuria, change in color of urine, no urgency or frequency.  No flank pain, no hematuria   MS:  No joint pain or swelling.  No decreased range of motion.  No back pain.    Physical Exam  BP 112/62 (BP Location: Left Arm, Cuff Size: Normal)   Pulse 69   Temp 98 F (36.7 C) (Oral)   Ht 5\' 9"  (1.753 m)   Wt 167 lb (75.8 kg)   SpO2 97%   BMI 24.66 kg/m   GEN: A/Ox3; pleasant , NAD, elderly    HEENT:  [place]/AT,  EACs-clear, TMs-wnl, NOSE-clear drainage , THROAT-clear, no lesions, no postnasal drip or exudate noted.   NECK:  Supple w/ fair ROM; no JVD; normal carotid impulses w/o bruits; no thyromegaly or nodules palpated; no lymphadenopathy.    RESP  Few rhonchi  w/o, wheezes/ rales/ or rhonchi. no accessory muscle use, no dullness to percussion  CARD:  RRR, no m/r/g, no peripheral edema, pulses intact, no cyanosis or clubbing.  GI:   Soft & nt; nml bowel sounds; no organomegaly or masses detected.   Musco: Warm bil, no deformities or  joint swelling noted.   Neuro: alert, no focal deficits noted.    Skin: Warm, no lesions or rashes  Psych:  No change in mood or affect. No depression or anxiety.  No memory loss.  Lab Results:  CBC    Component Value Date/Time   WBC 11.7 (H) 12/18/2015 1617   RBC 5.24 12/18/2015 1617   HGB 15.8 12/18/2015 1617   HCT 46.5 12/18/2015 1617   PLT 156.0 12/18/2015 1617   MCV 88.8 12/18/2015 1617   MCH 30.6 10/27/2015 1855   MCHC  34.1 12/18/2015 1617   RDW 13.8 12/18/2015 1617   LYMPHSABS 1.4 12/18/2015 1617   MONOABS 0.9 12/18/2015 1617   EOSABS 0.4 12/18/2015 1617   BASOSABS 0.1 12/18/2015 1617    BMET    Component Value Date/Time   NA 139 12/18/2015 1617   K 4.3 12/18/2015 1617   CL 101 12/18/2015 1617   CO2 31 12/18/2015 1617   GLUCOSE 100 (H) 12/18/2015 1617   BUN 19 12/18/2015 1617   CREATININE 1.09 12/18/2015 1617   CREATININE 1.04 09/04/2013 1043   CALCIUM 9.7 12/18/2015 1617   GFRNONAA 58 (L) 10/27/2015 1855   GFRAA >60 10/27/2015 1855    BNP No results found for: BNP  ProBNP No results found for: PROBNP  Imaging: No results found.   Assessment & Plan:   ALLERGIC RHINITIS, SEASONAL Flare , check RAST/IgE  Add Singulair .   Plan  Patient Instructions  Begin Zpack , take as directed  Prednisone taper over next week.  Begin Symbicort 160 2 puffs Twice daily  , rinse after use.  Add Singulair 10mg  At bedtime  .  Continue on Zyrtec and Flonase .  Follow up with Dr. Ashok Cordia in 95-months and As needed   Labs today .     Obstructive chronic bronchitis without exacerbation, due to chronic aspiration Gold C Flare with asthma component -elevated nitric oxide Check RAST /IgE   Plan  Patient Instructions  Begin Zpack , take as directed  Prednisone taper over next week.  Begin Symbicort 160 2 puffs Twice daily  , rinse after use.  Add Singulair 10mg  At bedtime  .  Continue on Zyrtec and Flonase .  Follow up with Dr. Ashok Cordia in 53-months and As  needed   Labs today .        Rexene Edison, NP 08/04/2016

## 2016-08-04 NOTE — Progress Notes (Signed)
Note reviewed.  Sonia Baller Ashok Cordia, M.D. [facility] Pulmonary [facility] Critical [facility] Pager:  [phone] After 3pm or if no response, call [phone] 10:54 AM 08/04/16

## 2016-08-04 NOTE — Patient Instructions (Addendum)
Begin Zpack , take as directed  Prednisone taper over next week.  Begin Symbicort 160 2 puffs Twice daily  , rinse after use.  Add Singulair 10mg  At bedtime  .  Continue on Zyrtec and Flonase .  Follow up with Dr. Ashok Cordia in 32-months and As needed   Labs today .

## 2016-08-04 NOTE — Assessment & Plan Note (Signed)
Flare , check RAST/IgE  Add Singulair .   Plan  Patient Instructions  Begin Zpack , take as directed  Prednisone taper over next week.  Begin Symbicort 160 2 puffs Twice daily  , rinse after use.  Add Singulair 10mg  At bedtime  .  Continue on Zyrtec and Flonase .  Follow up with Dr. Ashok Cordia in 30-months and As needed   Labs today .

## 2016-08-04 NOTE — Assessment & Plan Note (Signed)
Flare with asthma component -elevated nitric oxide Check RAST /IgE   Plan  Patient Instructions  Begin Zpack , take as directed  Prednisone taper over next week.  Begin Symbicort 160 2 puffs Twice daily  , rinse after use.  Add Singulair 10mg  At bedtime  .  Continue on Zyrtec and Flonase .  Follow up with Dr. Ashok Cordia in 43-months and As needed   Labs today .

## 2016-08-05 ENCOUNTER — Encounter: Payer: Self-pay | Admitting: Internal Medicine

## 2016-08-05 ENCOUNTER — Ambulatory Visit (INDEPENDENT_AMBULATORY_CARE_PROVIDER_SITE_OTHER): Payer: BLUE CROSS/BLUE SHIELD | Admitting: Internal Medicine

## 2016-08-05 ENCOUNTER — Other Ambulatory Visit: Payer: Self-pay | Admitting: Internal Medicine

## 2016-08-05 VITALS — BP 130/70 | HR 69 | Temp 98.1°F | Resp 20 | Wt 169.0 lb

## 2016-08-05 DIAGNOSIS — F419 Anxiety disorder, unspecified: Secondary | ICD-10-CM | POA: Diagnosis not present

## 2016-08-05 DIAGNOSIS — R739 Hyperglycemia, unspecified: Secondary | ICD-10-CM

## 2016-08-05 DIAGNOSIS — Z0001 Encounter for general adult medical examination with abnormal findings: Secondary | ICD-10-CM | POA: Diagnosis not present

## 2016-08-05 DIAGNOSIS — I10 Essential (primary) hypertension: Secondary | ICD-10-CM | POA: Diagnosis not present

## 2016-08-05 LAB — RESPIRATORY ALLERGY PROFILE REGION II ~~LOC~~
Allergen, A. alternata, m6: <0.1 kU/L
Allergen, C. Herbarum, M2: <0.1 kU/L
Allergen, D pternoyssinus,d7: <0.1 kU/L
Allergen, Mouse Urine Protein, e78: <0.1 kU/L
Allergen, P. notatum, m1: <0.1 kU/L
Bermuda Grass: <0.1 kU/L
Box Elder IgE: <0.1 kU/L
Cat Dander: <0.1 kU/L
Common Ragweed: <0.1 kU/L
Elm IgE: <0.1 kU/L
IgE (Immunoglobulin E), Serum: 5 kU/L (ref <115)
Pecan/Hickory Tree IgE: <0.1 kU/L
Rough Pigweed  IgE: <0.1 kU/L

## 2016-08-05 MED ORDER — ESCITALOPRAM OXALATE 10 MG PO TABS: 10.0000 mg | ORAL_TABLET | Freq: Every day | ORAL | 3 refills | Status: DC

## 2016-08-05 MED ORDER — CLONAZEPAM 0.5 MG PO TABS: 0.5000 mg | ORAL_TABLET | Freq: Two times a day (BID) | ORAL | 5 refills | Status: DC | PRN

## 2016-08-05 NOTE — Patient Instructions (Signed)
Please take all new medication as prescribed  - the lexapro 10 mg per day  OK to stop the celexa (citalopram)  Please continue all other medications as before, and refills have been done if requested - the klonopin  Please have the pharmacy call with any other refills you may need.  Please keep your appointments with your specialists as you may have planned  Please return in 6 months, or sooner if needed, with Lab testing done 3-5 days before

## 2016-08-05 NOTE — Progress Notes (Signed)
Pre visit review using our clinic review tool, if applicable. No additional management support is needed unless otherwise documented below in the visit note. 

## 2016-08-05 NOTE — Progress Notes (Signed)
Subjective:    Patient ID: [name], male    DOB: 07-23-1941, 75 y.o.   MRN: [id number]  HPI  Here to f/u with uncontrolled anxiety to which he seems to have good insight; Just cant sleep, feels "tight" all over, wakes up at 3am, has incresaed anxiety last 3 nights, during day the panic comes up and has to walk around outside.  Klonopin has helped in past, out of klonopin and celexa, asks for change to lexapro as well.  Incidentally has seen Pulm yesterday with zpack,prednisone, symbicort and singulair tx, some breathing improved today. Pt denies chest pain, increased sob or doe, wheezing, orthopnea, PND, increased LE swelling, palpitations, dizziness or syncope. Pt denies new neurological symptoms such as new headache, or facial or extremity weakness or numbness   Pt denies polydipsia, polyuria, Denies hyper or hypo thyroid symptoms such as voice, skin or hair change. Past Medical History:  Diagnosis Date  . Achalasia   . Anxiety    causes shortness  . Arthritis   . COPD (chronic obstructive pulmonary disease) ([place])    with chronic SOB  . Depression   . Dilated aortic root ([place])    3mm by echo 03/2016  . FH: cholecystectomy   . GERD (gastroesophageal reflux disease)   . Hiatal hernia   . Hypercholesterolemia   . Hypothyroidism 01/12/2016  . Lesion of right lung 06/04/2013  . MVP (mitral valve prolapse)    with severe MR s/p MV repair  . Osteopenia   . Pneumonia 06/04/2013   aspirated food 14  . S/P right inguinal herniorrhaphy   . Seasonal allergic rhinitis   . Stroke ([place]) 10/2002  . Transient ischemic attack    Past Surgical History:  Procedure Laterality Date  . BACK SURGERY     x 3  . BOTOX INJECTION  06/27/2012   Procedure: BOTOX INJECTION;  Surgeon: Garlan Fair, MD;  Location: [facility];  Service: Endoscopy;  Laterality: N/A;  . CHOLECYSTECTOMY    . ESOPHAGOGASTRODUODENOSCOPY  01/13/2012   Procedure: ESOPHAGOGASTRODUODENOSCOPY (EGD);  Surgeon: Garlan Fair, MD;  Location: [facility];  Service: Endoscopy;  Laterality: N/A;  botox /katrina  . ESOPHAGOGASTRODUODENOSCOPY  06/27/2012   Procedure: ESOPHAGOGASTRODUODENOSCOPY (EGD);  Surgeon: Garlan Fair, MD;  Location: [facility];  Service: Endoscopy;  Laterality: N/A;  . ESOPHAGOGASTRODUODENOSCOPY (EGD) WITH ESOPHAGEAL DILATION N/A 11/22/2012   Procedure: ESOPHAGOGASTRODUODENOSCOPY (EGD) WITH BOTOX INJECTION;  Surgeon: Garlan Fair, MD;  Location: [facility];  Service: Endoscopy;  Laterality: N/A;  . Esophagogastroduodenoscopy and Savary esophageal dilation.     x9  . ESOPHAGOSCOPY W/ BOTOX INJECTION     x9 beginning 2008, most recent Sept. 2012  . eyebrow surgery  2012  . FOOT SURGERY Left    screw -fx  . 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 MITRAL VALVE REPAIR  08/03/2011   Procedure: MINIMALLY INVASIVE MITRAL VALVE REPAIR (MVR);  Surgeon: Rexene Alberts, MD;  Location: [facility];  Service: Open Heart Surgery;  Laterality: Right;  minimally invasive MVR  . rt inguinal herniorrhaphy  1969/1997  . TEE WITHOUT CARDIOVERSION  07/06/2011   Procedure: TRANSESOPHAGEAL ECHOCARDIOGRAM (TEE);  Surgeon: Sueanne Margarita, MD;  Location: [facility];  Service: Cardiovascular;  Laterality: N/A;  . VIDEO BRONCHOSCOPY WITH ENDOBRONCHIAL NAVIGATION N/A 04/03/2014   Procedure: VIDEO BRONCHOSCOPY WITH ENDOBRONCHIAL NAVIGATION;  Surgeon: Collene Gobble, MD;  Location: [facility];  Service: Thoracic;  Laterality: N/A;    reports that  he quit smoking about 18 years ago. His smoking use included Cigarettes. He has a 57.00 pack-year smoking history. He has never used smokeless tobacco. He reports that he does not drink alcohol or use drugs. family history includes Cancer in his sister; Heart disease in his sister; Lung cancer in his sister; Scleroderma (age of onset: 63) in his mother; Stroke (age of onset: 63) in his father. Allergies  Allergen Reactions  . Duac [Clindamycin Phos-Benzoyl Perox]  Rash  . Contrast Media [Iodinated Diagnostic Agents] Rash    Heavy rash and itching 20 yrs ago. Needs full premeds and does well with this.  . Atorvastatin     muscle pain  . Propoxyphene N-Acetaminophen Itching    rash  . Rosuvastatin     muscle weakness  . Metrizamide Rash    radiopaque contrast agent. Heavy rash and itching 20 yrs ago. Needs full premeds and does well with this.  . Other Rash    High acid food--tomatoes, orange juice   Current Outpatient Prescriptions on File Prior to Visit  Medication Sig Dispense Refill  . albuterol (VENTOLIN HFA) 108 (90 Base) MCG/ACT inhaler INHALE 2 PUFFS INTO THE LUNGS EVERY 4 (FOUR) HOURS AS NEEDED FOR WHEEZING. 18 Inhaler 6  . budesonide-formoterol (SYMBICORT) 160-4.5 MCG/ACT inhaler Inhale 2 puffs into the lungs 2 (two) times daily. 1 Inhaler 12  . budesonide-formoterol (SYMBICORT) 160-4.5 MCG/ACT inhaler Inhale 2 puffs into the lungs 2 (two) times daily. 1 Inhaler 6  . budesonide-formoterol (SYMBICORT) 160-4.5 MCG/ACT inhaler Inhale 2 puffs into the lungs 2 (two) times daily. 1 Inhaler 0  . calcium carbonate (TUMS EX) 750 MG chewable tablet Chew 1 tablet by mouth as needed (for gas and reflux).     . cetirizine (ZYRTEC) 10 MG tablet Take 10 mg by mouth daily as needed for allergies. Reported on 10/27/2015    . Cholecalciferol (VITAMIN D3) 1000 UNITS CAPS Take 1,000 Units by mouth daily. Reported on 09/16/2015    . fluticasone (FLONASE) 50 MCG/ACT nasal spray Place 2 sprays into both nostrils daily. 16 g 6  . levothyroxine (SYNTHROID, LEVOTHROID) 25 MCG tablet Take 1 tablet (25 mcg total) by mouth daily before breakfast. 90 tablet 3  . lovastatin (MEVACOR) 20 MG tablet Take 1 tablet (20 mg total) by mouth at bedtime. 90 tablet 3  . metroNIDAZOLE (METROGEL) 1 % gel Apply 1 application topically daily as needed (to skin). Apply thin layer to affected area externally    . montelukast (SINGULAIR) 10 MG tablet Take 1 tablet (10 mg total) by mouth at  bedtime. 30 tablet 11  . Multiple Vitamins-Minerals (ICAPS AREDS 2) CAPS Take 1 capsule by mouth 2 (two) times daily.     . pantoprazole (PROTONIX) 40 MG tablet Take 1 tablet (40 mg total) by mouth 2 (two) times daily before a meal. 60 tablet 11  . predniSONE (DELTASONE) 10 MG tablet 4 tabs for 2 days, then 3 tabs for 2 days, 2 tabs for 2 days, then 1 tab for 2 days, then stop 20 tablet 0  . tiotropium (SPIRIVA HANDIHALER) 18 MCG inhalation capsule Place 1 capsule (18 mcg total) into inhaler and inhale daily. 30 capsule 6   No current facility-administered medications on file prior to visit.    Review of Systems  Constitutional: Negative for unusual diaphoresis or night sweats HENT: Negative for ear swelling or discharge Eyes: Negative for worsening visual haziness  Respiratory: Negative for choking and stridor.   Gastrointestinal: Negative for distension or worsening  eructation Genitourinary: Negative for retention or change in urine volume.  Musculoskeletal: Negative for other MSK pain or swelling Skin: Negative for color change and worsening wound Neurological: Negative for tremors and numbness other than noted  Psychiatric/Behavioral: Negative for decreased concentration or agitation other than above   All other system neg per pt    Objective:   Physical Exam BP 130/70   Pulse 69   Temp 98.1 F (36.7 C) (Oral)   Resp 20   Wt 169 lb (76.7 kg)   SpO2 96%   BMI 24.96 kg/m  VS noted,  Constitutional: Pt appears in no apparent distress HENT: Head: NCAT.  Right Ear: External ear normal.  Left Ear: External ear normal.  Eyes: . Pupils are equal, round, and reactive to light. Conjunctivae and EOM are normal Neck: Normal range of motion. Neck supple.  Cardiovascular: Normal rate and regular rhythm.   Pulmonary/Chest: Effort normal and breath sounds decresed without rales or wheezing.  Neurological: Pt is alert. Not confused , motor grossly intact Skin: Skin is warm. No rash, no  LE edema Psychiatric: Pt behavior is normal. No agitation. 2+ nervous No other new exam findings    Assessment & Plan:

## 2016-08-06 NOTE — Telephone Encounter (Signed)
faxed

## 2016-08-06 NOTE — Progress Notes (Signed)
LMTCB

## 2016-08-06 NOTE — Telephone Encounter (Signed)
Done hardcopy to Corinne  

## 2016-08-07 NOTE — Assessment & Plan Note (Signed)
stable overall by history and exam, recent data reviewed with pt, and pt to continue medical treatment as before,  to f/u any worsening symptoms or concerns Lab Results  Component Value Date   HGBA1C 6.2 12/18/2015

## 2016-08-07 NOTE — Assessment & Plan Note (Signed)
stable overall by history and exam, recent data reviewed with pt, and pt to continue medical treatment as before,  to f/u any worsening symptoms or concerns BP Readings from Last 3 Encounters:  08/05/16 130/70  08/04/16 112/62  06/01/16 118/68

## 2016-08-07 NOTE — Assessment & Plan Note (Signed)
Moderate to severe persistent, for klonopin restart prn use, and lexapro 10 qd, declines counseling referral,  to f/u any worsening symptoms or concerns

## 2016-10-06 ENCOUNTER — Encounter: Payer: Self-pay | Admitting: Pulmonary Disease

## 2016-10-06 ENCOUNTER — Ambulatory Visit (INDEPENDENT_AMBULATORY_CARE_PROVIDER_SITE_OTHER): Payer: BLUE CROSS/BLUE SHIELD | Admitting: Pulmonary Disease

## 2016-10-06 VITALS — BP 112/72 | HR 64 | Ht 69.0 in | Wt 173.8 lb

## 2016-10-06 DIAGNOSIS — K219 Gastro-esophageal reflux disease without esophagitis: Secondary | ICD-10-CM

## 2016-10-06 DIAGNOSIS — J449 Chronic obstructive pulmonary disease, unspecified: Secondary | ICD-10-CM

## 2016-10-06 DIAGNOSIS — J309 Allergic rhinitis, unspecified: Secondary | ICD-10-CM | POA: Diagnosis not present

## 2016-10-06 NOTE — Progress Notes (Signed)
Subjective:    Patient ID: Chris Lewis, male    DOB: June 24, 1941, 76 y.o.   MRN: [id number]  C.C.:  Follow-up for Moderate COPD, Chronic Allergic Rhinitis, & GERD.  HPI  Moderate COPD: Currently prescribed Spiriva. Reports he has intermittent coughing that he attributes to a "tickle" in his throat & sinus drainage. No wheezing. Reports he is using his Ventolin rescue inhaler 2-3 times a day at most, but this is not a routine thing. No exacerbations since last appointment.   Chronic allergic rhinitis: Prescribed Flonase. The patient reports he has noticed some sinus congestion & drainage, particularly in the morning. This has worsened over the last couple of weeks. Has been out of Flonsae 2 days. Reports he is compliant with Singulair.    GERD: Patient does have a history of achalasia. Follows with [facility]. No reflux or dyspepsia on a regular basis. He hasn't been using Protonix regularly.   Review of Systems No new chest pain or pressure. No fever, chills, or sweats. No headache or vision changes.  Allergies  Allergen Reactions  . Duac [Clindamycin Phos-Benzoyl Perox] Rash  . Contrast Media [Iodinated Diagnostic Agents] Rash    Heavy rash and itching 20 yrs ago. Needs full premeds and does well with this.  . Atorvastatin     muscle pain  . Propoxyphene N-Acetaminophen Itching    rash  . Rosuvastatin     muscle weakness  . Metrizamide Rash    radiopaque contrast agent. Heavy rash and itching 20 yrs ago. Needs full premeds and does well with this.  . Other Rash    High acid food--tomatoes, orange juice    Current Outpatient Prescriptions on File Prior to Visit  Medication Sig Dispense Refill  . albuterol (VENTOLIN HFA) 108 (90 Base) MCG/ACT inhaler INHALE 2 PUFFS INTO THE LUNGS EVERY 4 (FOUR) HOURS AS NEEDED FOR WHEEZING. 18 Inhaler 6  . calcium carbonate (TUMS EX) 750 MG chewable tablet Chew 1 tablet by mouth as needed (for gas and reflux).     . cetirizine (ZYRTEC) 10 MG  tablet Take 10 mg by mouth daily as needed for allergies. Reported on 10/27/2015    . Cholecalciferol (VITAMIN D3) 1000 UNITS CAPS Take 1,000 Units by mouth daily. Reported on 09/16/2015    . clonazePAM (KLONOPIN) 0.5 MG tablet Take 1 tablet (0.5 mg total) by mouth 2 (two) times daily as needed for anxiety. 60 tablet 5  . escitalopram (LEXAPRO) 10 MG tablet Take 1 tablet (10 mg total) by mouth daily. 90 tablet 3  . fluticasone (FLONASE) 50 MCG/ACT nasal spray Place 2 sprays into both nostrils daily. 16 g 6  . levothyroxine (SYNTHROID, LEVOTHROID) 25 MCG tablet Take 1 tablet (25 mcg total) by mouth daily before breakfast. 90 tablet 3  . lovastatin (MEVACOR) 20 MG tablet Take 1 tablet (20 mg total) by mouth at bedtime. 90 tablet 3  . metroNIDAZOLE (METROGEL) 1 % gel Apply 1 application topically daily as needed (to skin). Apply thin layer to affected area externally    . montelukast (SINGULAIR) 10 MG tablet Take 1 tablet (10 mg total) by mouth at bedtime. 30 tablet 11  . Multiple Vitamins-Minerals (ICAPS AREDS 2) CAPS Take 1 capsule by mouth 2 (two) times daily.     . predniSONE (DELTASONE) 10 MG tablet 4 tabs for 2 days, then 3 tabs for 2 days, 2 tabs for 2 days, then 1 tab for 2 days, then stop 20 tablet 0  . tiotropium (SPIRIVA  HANDIHALER) 18 MCG inhalation capsule Place 1 capsule (18 mcg total) into inhaler and inhale daily. 30 capsule 6  . traMADol (ULTRAM) 50 MG tablet TAKE 1 TABLET BY MOUTH EVERY 6 HOURS AS NEEDED 60 tablet 1  . budesonide-formoterol (SYMBICORT) 160-4.5 MCG/ACT inhaler Inhale 2 puffs into the lungs 2 (two) times daily. (Patient not taking: Reported on 10/06/2016) 1 Inhaler 12  . budesonide-formoterol (SYMBICORT) 160-4.5 MCG/ACT inhaler Inhale 2 puffs into the lungs 2 (two) times daily. (Patient not taking: Reported on 10/06/2016) 1 Inhaler 6  . budesonide-formoterol (SYMBICORT) 160-4.5 MCG/ACT inhaler Inhale 2 puffs into the lungs 2 (two) times daily. (Patient not taking: Reported on  10/06/2016) 1 Inhaler 0  . pantoprazole (PROTONIX) 40 MG tablet Take 1 tablet (40 mg total) by mouth 2 (two) times daily before a meal. (Patient not taking: Reported on 10/06/2016) 60 tablet 11   No current facility-administered medications on file prior to visit.     Past Medical History:  Diagnosis Date  . Achalasia   . Anxiety    causes shortness  . Arthritis   . COPD (chronic obstructive pulmonary disease) ([place])    with chronic SOB  . Depression   . Dilated aortic root ([place])    85mm by echo 03/2016  . FH: cholecystectomy   . GERD (gastroesophageal reflux disease)   . Hiatal hernia   . Hypercholesterolemia   . Hypothyroidism 01/12/2016  . Lesion of right lung 06/04/2013  . MVP (mitral valve prolapse)    with severe MR s/p MV repair  . Osteopenia   . Pneumonia 06/04/2013   aspirated food 14  . S/P right inguinal herniorrhaphy   . Seasonal allergic rhinitis   . Stroke ([place]) 10/2002  . Transient ischemic attack     Past Surgical History:  Procedure Laterality Date  . BACK SURGERY     x 3  . BOTOX INJECTION  06/27/2012   Procedure: BOTOX INJECTION;  Surgeon: Garlan Fair, MD;  Location: [facility];  Service: Endoscopy;  Laterality: N/A;  . CHOLECYSTECTOMY    . ESOPHAGOGASTRODUODENOSCOPY  01/13/2012   Procedure: ESOPHAGOGASTRODUODENOSCOPY (EGD);  Surgeon: Garlan Fair, MD;  Location: [facility];  Service: Endoscopy;  Laterality: N/A;  botox /katrina  . ESOPHAGOGASTRODUODENOSCOPY  06/27/2012   Procedure: ESOPHAGOGASTRODUODENOSCOPY (EGD);  Surgeon: Garlan Fair, MD;  Location: [facility];  Service: Endoscopy;  Laterality: N/A;  . ESOPHAGOGASTRODUODENOSCOPY (EGD) WITH ESOPHAGEAL DILATION N/A 11/22/2012   Procedure: ESOPHAGOGASTRODUODENOSCOPY (EGD) WITH BOTOX INJECTION;  Surgeon: Garlan Fair, MD;  Location: [facility];  Service: Endoscopy;  Laterality: N/A;  . Esophagogastroduodenoscopy and Savary esophageal dilation.     x9  . ESOPHAGOSCOPY W/ BOTOX  INJECTION     x9 beginning 2008, most recent Sept. 2012  . eyebrow surgery  2012  . FOOT SURGERY Left    screw -fx  . LAPAROSCOPIC HELLER MYOTOMY  July 2014   [facility]  . MITRAL VALVE REPAIR  08/03/2011   Procedure: MINIMALLY INVASIVE MITRAL VALVE REPAIR (MVR);  Surgeon: Rexene Alberts, MD;  Location: [place];  Service: Open Heart Surgery;  Laterality: Right;  minimally invasive MVR  . rt inguinal herniorrhaphy  1969/1997  . TEE WITHOUT CARDIOVERSION  07/06/2011   Procedure: TRANSESOPHAGEAL ECHOCARDIOGRAM (TEE);  Surgeon: Sueanne Margarita, MD;  Location: [place];  Service: Cardiovascular;  Laterality: N/A;  . VIDEO BRONCHOSCOPY WITH ENDOBRONCHIAL NAVIGATION N/A 04/03/2014   Procedure: VIDEO BRONCHOSCOPY WITH ENDOBRONCHIAL NAVIGATION;  Surgeon: Collene Gobble, MD;  Location: [place];  Service: Thoracic;  Laterality: N/A;    Family History  Problem Relation Age of Onset  . Stroke Father 72    cva  . Scleroderma Mother 16  . Lung cancer Sister   . Cancer Sister   . Heart disease Sister   . Anesthesia problems Neg Hx     Social History   Social History  . Marital status: Married    Spouse name: N/A  . Number of children: 2  . Years of education: N/A   Occupational History  .  Retired   Social History Main Topics  . Smoking status: Former Smoker    Packs/day: 1.50    Years: 38.00    Types: Cigarettes    Quit date: 08/29/1997  . Smokeless tobacco: Never Used  . Alcohol use No  . Drug use: No  . Sexual activity: Not Asked   Other Topics Concern  . None   Social History Narrative  . None      Objective:   Physical Exam BP 112/72 (BP Location: Left Arm, Patient Position: Sitting, Cuff Size: Normal)   Pulse 64   Ht 5\' 9"  (1.753 m)   Wt 173 lb 12.8 oz (78.8 kg)   SpO2 96%   BMI 25.67 kg/m  Gen.: Caucasian male. No distress. Appears comfortable. Integument: Warm and dry. No rash on exposed skin. Extremities: No cyanosis or clubbing. Warm and dry. Cardiovascular:  Regular rate. No edema. No JVD. Abdomen: Soft. Nontender. Normal bowel sounds. HEENT: Minimal nasal turbinate swelling. No oral ulcers. Moist mucous membranes. Pulmonary: Good aeration bilaterally. Clear with auscultation. No accessory muscle use on room air.  PFT 12/20/13: FVC 3.97 L (97%) FEV1 1.90 L (64%) FEV1/FVC 0.48 FEF 25-75 0.58 L (26%) no bronchodilator response TLC 5.37 L (78%) RV 49% ERV 209% DLCO uncorrected 47%  IMAGING CXR PA/LAT 05/25/16 (personally reviewed by me): No new parenchymal nodule or opacity appreciated when compared with 2014 x-ray imaging. No pleural effusion. Mild hyperinflation. Heart normal in size & mediastinum normal in contour.  LABS 08/04/16 RAST panel: Negative IgE: 5  05/27/10 Alpha-1 Antitrypsin:  MM    Assessment & Plan:  76 y.o. male with moderate COPD, chronic allergic rhinitis, & GERD. Overall the patient's reflux seems to be well-controlled at this time. He remains relatively symptomatic with regards to his COPD and in reviewing his chest x-ray from October there is no new parenchymal anomaly. He did have allergy testing in December which was negative but does have intermittent allergic rhinitis which may benefit from an alteration in his current regimen. I instructed the patient contact my office if he had any new breathing problems or questions before his next appointment.  1. Moderate COPD: Continuing Spiriva. No changes. Checking full pulmonary function testing at follow-up appointment. 2. Chronic allergic rhinitis: Recommended over-the-counter Zyrtec added to patient's regimen of Flonase and Singulair. Also recommended twice daily nasal saline rinse. 3. GERD: Recommended patient notify his GI specialist of his discontinuation of Protonix. 4. Health maintenance: Status post influenza vaccine September 2017, Prevnar January 2017, & Pneumovax September 2015. 5. Follow-up: Return to clinic in 6 months or sooner if needed.  Sonia Baller Ashok Cordia,  M.D. [facility] Pager:  [phone] After 3pm or if no response, call [phone] 2:35 PM 10/06/16

## 2016-10-06 NOTE — Patient Instructions (Signed)
   Make sure you let Dr. Wynetta Emery know that you stopped taking your Pantoprazole/Protonix.  Try the generic Zyrtec to see if it helps you tick & sinus drainage.  You can also try using a nasal saline rinse like NeilMed twice daily to help flush out your sinuses.  Call me if you have any new symptoms or questions before your next appointment.  TESTS ORDERED: 1. Full PFTs at follow-up

## 2016-10-08 ENCOUNTER — Other Ambulatory Visit: Payer: Self-pay | Admitting: Adult Health

## 2016-10-27 ENCOUNTER — Other Ambulatory Visit: Payer: Self-pay | Admitting: Internal Medicine

## 2016-10-27 NOTE — Telephone Encounter (Signed)
Done hardcopy to Anna  

## 2016-10-27 NOTE — Telephone Encounter (Signed)
Send and faxed to the pharmacy

## 2016-10-29 DIAGNOSIS — L738 Other specified follicular disorders: Secondary | ICD-10-CM | POA: Diagnosis not present

## 2016-10-29 DIAGNOSIS — D225 Melanocytic nevi of trunk: Secondary | ICD-10-CM | POA: Diagnosis not present

## 2016-10-29 DIAGNOSIS — D1801 Hemangioma of skin and subcutaneous tissue: Secondary | ICD-10-CM | POA: Diagnosis not present

## 2016-10-29 DIAGNOSIS — L821 Other seborrheic keratosis: Secondary | ICD-10-CM | POA: Diagnosis not present

## 2016-11-05 ENCOUNTER — Other Ambulatory Visit: Payer: Self-pay | Admitting: Internal Medicine

## 2016-11-12 ENCOUNTER — Other Ambulatory Visit: Payer: Self-pay | Admitting: Adult Health

## 2016-11-25 ENCOUNTER — Encounter: Payer: Self-pay | Admitting: Internal Medicine

## 2016-11-25 ENCOUNTER — Ambulatory Visit (INDEPENDENT_AMBULATORY_CARE_PROVIDER_SITE_OTHER): Payer: BLUE CROSS/BLUE SHIELD | Admitting: Internal Medicine

## 2016-11-25 ENCOUNTER — Ambulatory Visit (INDEPENDENT_AMBULATORY_CARE_PROVIDER_SITE_OTHER)
Admission: RE | Admit: 2016-11-25 | Discharge: 2016-11-25 | Disposition: A | Discharge status: Home / Self Care | Payer: BLUE CROSS/BLUE SHIELD | Source: Ambulatory Visit | Attending: Internal Medicine | Admitting: Internal Medicine

## 2016-11-25 VITALS — BP 116/76 | HR 62 | Temp 97.5°F | Ht 69.0 in | Wt 170.0 lb

## 2016-11-25 DIAGNOSIS — R739 Hyperglycemia, unspecified: Secondary | ICD-10-CM | POA: Diagnosis not present

## 2016-11-25 DIAGNOSIS — R059 Cough, unspecified: Secondary | ICD-10-CM

## 2016-11-25 DIAGNOSIS — I10 Essential (primary) hypertension: Secondary | ICD-10-CM

## 2016-11-25 DIAGNOSIS — R05 Cough: Secondary | ICD-10-CM

## 2016-11-25 MED ORDER — HYDROCODONE-HOMATROPINE 5-1.5 MG/5ML PO SYRP: 5.0000 mL | ORAL_SOLUTION | Freq: Four times a day (QID) | ORAL | 0 refills | Status: AC | PRN

## 2016-11-25 NOTE — Patient Instructions (Signed)
Please take all new medication as prescribed - the cough medicine  Please continue all other medications as before, and refills have been done if requested.  Please have the pharmacy call with any other refills you may need.  Please continue your efforts at being more active, low cholesterol diet, and weight control.  You are otherwise up to date with prevention measures today.  Please keep your appointments with your specialists as you may have planned  Please go to the XRAY Department in the Basement (go straight as you get off the elevator) for the x-ray testing  You will be contacted by phone if any changes need to be made immediately.  Otherwise, you will receive a letter about your results with an explanation, but please check with MyChart first.  Please remember to sign up for MyChart if you have not done so, as this will be important to you in the future with finding out test results, communicating by private email, and scheduling acute appointments online when needed.

## 2016-11-25 NOTE — Assessment & Plan Note (Signed)
Mild to mod, c/w bronchitis vs pna but suspect resolving viral bronchitis, for cxr, cough med prn, antibx for abnormal cxr,  to f/u any worsening symptoms or concerns

## 2016-11-25 NOTE — Assessment & Plan Note (Signed)
stable overall by history and exam, recent data reviewed with pt, and pt to continue medical treatment as before,  to f/u any worsening symptoms or concerns BP Readings from Last 3 Encounters:  11/25/16 116/76  10/06/16 112/72  08/05/16 130/70

## 2016-11-25 NOTE — Progress Notes (Signed)
Subjective:    Patient ID: [name], male    DOB: [birth year], 76 y.o.   MRN: [id number]  HPI   Here with Mon apr 2, awoke with 'whole body shaking" , had to get to BR but ended up urinating outside the commode, had to go back later to clean it up, teeth chattering, chills and had to stay in bed all day, did not seek medical congestion.  The second day had a greenish prod cough with trace blood just that morning, and the shakes were gone.  Also with quite a bit of nasal congestion with clear and yellow d/c.  Since then has improved overall and no more shakes but still feesl weak. And cough persists. Had "pain all over" but o/w Pt denies chest pain, increased sob or doe, wheezing, orthopnea, PND, increased LE swelling, palpitations, dizziness or syncope.   Denies urinary symptoms such as dysuria, frequency, urgency, flank pain, hematuria or n/v, fever, chills.  Pt denies polydipsia, polyuria,     Past Medical History:  Diagnosis Date  . Achalasia   . Anxiety    causes shortness  . Arthritis   . COPD (chronic obstructive pulmonary disease) ([place])    with chronic SOB  . Depression   . Dilated aortic root ([place])    12mm by echo 03/2016  . FH: cholecystectomy   . GERD (gastroesophageal reflux disease)   . Hiatal hernia   . Hypercholesterolemia   . Hypothyroidism 01/12/2016  . Lesion of right lung 06/04/2013  . MVP (mitral valve prolapse)    with severe MR s/p MV repair  . Osteopenia   . Pneumonia 06/04/2013   aspirated food 14  . S/P right inguinal herniorrhaphy   . Seasonal allergic rhinitis   . Stroke ([place]) 10/2002  . Transient ischemic attack    Past Surgical History:  Procedure Laterality Date  . BACK SURGERY     x 3  . BOTOX INJECTION  06/27/2012   Procedure: BOTOX INJECTION;  Surgeon: Garlan Fair, MD;  Location: [facility];  Service: Endoscopy;  Laterality: N/A;  . CHOLECYSTECTOMY    . ESOPHAGOGASTRODUODENOSCOPY  01/13/2012   Procedure: ESOPHAGOGASTRODUODENOSCOPY  (EGD);  Surgeon: Garlan Fair, MD;  Location: [facility];  Service: Endoscopy;  Laterality: N/A;  botox /katrina  . ESOPHAGOGASTRODUODENOSCOPY  06/27/2012   Procedure: ESOPHAGOGASTRODUODENOSCOPY (EGD);  Surgeon: Garlan Fair, MD;  Location: [facility];  Service: Endoscopy;  Laterality: N/A;  . ESOPHAGOGASTRODUODENOSCOPY (EGD) WITH ESOPHAGEAL DILATION N/A 11/22/2012   Procedure: ESOPHAGOGASTRODUODENOSCOPY (EGD) WITH BOTOX INJECTION;  Surgeon: Garlan Fair, MD;  Location: [facility];  Service: Endoscopy;  Laterality: N/A;  . Esophagogastroduodenoscopy and Savary esophageal dilation.     x9  . ESOPHAGOSCOPY W/ BOTOX INJECTION     x9 beginning 2008, most recent Sept. 2012  . eyebrow surgery  2012  . FOOT SURGERY Left    screw -fx  . LAPAROSCOPIC HELLER MYOTOMY  July 2014   [facility]  . MITRAL VALVE REPAIR  08/03/2011   Procedure: MINIMALLY INVASIVE MITRAL VALVE REPAIR (MVR);  Surgeon: Rexene Alberts, MD;  Location: [place];  Service: Open Heart Surgery;  Laterality: Right;  minimally invasive MVR  . rt inguinal herniorrhaphy  1969/1997  . TEE WITHOUT CARDIOVERSION  07/06/2011   Procedure: TRANSESOPHAGEAL ECHOCARDIOGRAM (TEE);  Surgeon: Sueanne Margarita, MD;  Location: [place];  Service: Cardiovascular;  Laterality: N/A;  . VIDEO BRONCHOSCOPY WITH ENDOBRONCHIAL NAVIGATION N/A 04/03/2014   Procedure: VIDEO BRONCHOSCOPY WITH ENDOBRONCHIAL NAVIGATION;  Surgeon: Collene Gobble, MD;  Location: [place];  Service: Thoracic;  Laterality: N/A;    reports that he quit smoking about 19 years ago. His smoking use included Cigarettes. He has a 57.00 pack-year smoking history. He has never used smokeless tobacco. He reports that he does not drink alcohol or use drugs. family history includes Cancer in his sister; Heart disease in his sister; Lung cancer in his sister; Scleroderma (age of onset: 67) in his mother; Stroke (age of onset: 72) in his father. Allergies  Allergen Reactions  . Duac  [Clindamycin Phos-Benzoyl Perox] Rash  . Contrast Media [Iodinated Diagnostic Agents] Rash    Heavy rash and itching 20 yrs ago. Needs full premeds and does well with this.  . Atorvastatin     muscle pain  . Propoxyphene N-Acetaminophen Itching    rash  . Rosuvastatin     muscle weakness  . Metrizamide Rash    radiopaque contrast agent. Heavy rash and itching 20 yrs ago. Needs full premeds and does well with this.  . Other Rash    High acid food--tomatoes, orange juice   Current Outpatient Prescriptions on File Prior to Visit  Medication Sig Dispense Refill  . albuterol (VENTOLIN HFA) 108 (90 Base) MCG/ACT inhaler INHALE 2 PUFFS INTO THE LUNGS EVERY 4 (FOUR) HOURS AS NEEDED FOR WHEEZING. 18 Inhaler 6  . calcium carbonate (TUMS EX) 750 MG chewable tablet Chew 1 tablet by mouth as needed (for gas and reflux).     . cetirizine (ZYRTEC) 10 MG tablet Take 10 mg by mouth daily as needed for allergies. Reported on 10/27/2015    . Cholecalciferol (VITAMIN D3) 1000 UNITS CAPS Take 1,000 Units by mouth daily. Reported on 09/16/2015    . clonazePAM (KLONOPIN) 0.5 MG tablet Take 1 tablet (0.5 mg total) by mouth 2 (two) times daily as needed for anxiety. 60 tablet 5  . escitalopram (LEXAPRO) 10 MG tablet Take 1 tablet (10 mg total) by mouth daily. 90 tablet 3  . fluticasone (FLONASE) 50 MCG/ACT nasal spray PLACE 2 SPRAYS INTO BOTH NOSTRILS DAILY. 16 g 2  . levothyroxine (SYNTHROID, LEVOTHROID) 25 MCG tablet TAKE 1 TABLET (25 MCG TOTAL) BY MOUTH DAILY BEFORE BREAKFAST. 90 tablet 2  . lovastatin (MEVACOR) 20 MG tablet Take 1 tablet (20 mg total) by mouth at bedtime. 90 tablet 3  . Multiple Vitamins-Minerals (ICAPS AREDS 2) CAPS Take 1 capsule by mouth 2 (two) times daily.     Marland Kitchen SPIRIVA HANDIHALER 18 MCG inhalation capsule PLACE 1 CAPSULE (18 MCG TOTAL) INTO INHALER AND INHALE DAILY. 30 capsule 5  . traMADol (ULTRAM) 50 MG tablet TAKE 1 TABLET BY MOUTH EVERY 6 HOURS AS NEEDED 60 tablet 1  . VENTOLIN HFA  108 (90 Base) MCG/ACT inhaler INHALE 2 PUFFS INTO THE LUNGS EVERY 4 (FOUR) HOURS AS NEEDED FOR WHEEZING. 18 g 5   No current facility-administered medications on file prior to visit.    Review of Systems  Constitutional: Negative for other unusual diaphoresis or sweats HENT: Negative for ear discharge or swelling Eyes: Negative for other worsening visual disturbances Respiratory: Negative for stridor or other swelling  Gastrointestinal: Negative for worsening distension or other blood Genitourinary: Negative for retention or other urinary change Musculoskeletal: Negative for other MSK pain or swelling Skin: Negative for color change or other new lesions Neurological: Negative for worsening tremors and other numbness  Psychiatric/Behavioral: Negative for worsening agitation or other fatigue All other system neg per pt    Objective:  Physical Exam BP 116/76   Pulse 62   Temp 97.5 F (36.4 C) (Oral)   Ht 5\' 9"  (1.753 m)   Wt 170 lb (77.1 kg)   SpO2 99%   BMI 25.10 kg/m  VS noted, non toxic Constitutional: Pt appears in NAD HENT: Head: NCAT.  Right Ear: External ear normal.  Left Ear: External ear normal.  Bilat tm's with mild erythema.  Max sinus areas non tender.  Pharynx with mild erythema, no exudate Eyes: . Pupils are equal, round, and reactive to light. Conjunctivae and EOM are normal Nose: without d/c or deformity Neck: Neck supple. Gross normal ROM Cardiovascular: Normal rate and regular rhythm.   Pulmonary/Chest: Effort normal and breath sounds some decreased but without rales or wheezing.  Abd:  Soft, NT, ND, + BS, no organomegaly Neurological: Pt is alert. At baseline orientation, motor grossly intact Skin: Skin is warm. No rashes, other new lesions, no LE edema Psychiatric: Pt behavior is normal without agitation  No other exam findings    Assessment & Plan:

## 2016-11-25 NOTE — Assessment & Plan Note (Signed)
stable overall by history and exam, recent data reviewed with pt, and pt to continue medical treatment as before,  to f/u any worsening symptoms or concerns Lab Results  Component Value Date   HGBA1C 6.2 12/18/2015   Pt to call for onset polys or cbg > 200 with illness

## 2016-11-25 NOTE — Progress Notes (Signed)
Pre visit review using our clinic review tool, if applicable. No additional management support is needed unless otherwise documented below in the visit note. 

## 2016-12-17 ENCOUNTER — Other Ambulatory Visit: Payer: Self-pay | Admitting: *Deleted

## 2016-12-17 DIAGNOSIS — S30860A Insect bite (nonvenomous) of lower back and pelvis, initial encounter: Secondary | ICD-10-CM | POA: Diagnosis not present

## 2016-12-17 DIAGNOSIS — W57XXXA Bitten or stung by nonvenomous insect and other nonvenomous arthropods, initial encounter: Secondary | ICD-10-CM | POA: Diagnosis not present

## 2016-12-17 MED ORDER — FLUTICASONE PROPIONATE 50 MCG/ACT NA SUSP: 2.0000 | Freq: Every day | NASAL | 3 refills | Status: DC

## 2016-12-22 ENCOUNTER — Telehealth: Payer: Self-pay | Admitting: Pulmonary Disease

## 2016-12-22 MED ORDER — TIOTROPIUM BROMIDE MONOHYDRATE 18 MCG IN CAPS: 18.0000 ug | ORAL_CAPSULE | Freq: Every day | RESPIRATORY_TRACT | 3 refills | Status: DC

## 2016-12-22 NOTE — Telephone Encounter (Signed)
Spoke with pt, who states his insurance now requires for spiriva to be a 90 supply. Pt states per his insurance ventolin does not have to be a 90 day supply. Spiriva 18MCG has been sent to [facility] per pt request. Pt states flonase will also need to be a 90 supply, pt  he prefers to call when refills are needed for flonase.  Nothing further needed.

## 2016-12-27 ENCOUNTER — Telehealth: Payer: Self-pay | Admitting: Internal Medicine

## 2016-12-27 NOTE — Telephone Encounter (Signed)
Any future refills please send to [facility] on [address].

## 2016-12-27 NOTE — Telephone Encounter (Signed)
Ok. I have deleted the [facility] and kept the [facility] in the chart.

## 2017-01-06 ENCOUNTER — Ambulatory Visit (INDEPENDENT_AMBULATORY_CARE_PROVIDER_SITE_OTHER): Payer: BLUE CROSS/BLUE SHIELD | Admitting: Internal Medicine

## 2017-01-06 DIAGNOSIS — F419 Anxiety disorder, unspecified: Secondary | ICD-10-CM | POA: Diagnosis not present

## 2017-01-06 DIAGNOSIS — J449 Chronic obstructive pulmonary disease, unspecified: Secondary | ICD-10-CM | POA: Insufficient documentation

## 2017-01-06 DIAGNOSIS — J441 Chronic obstructive pulmonary disease with (acute) exacerbation: Secondary | ICD-10-CM

## 2017-01-06 DIAGNOSIS — I10 Essential (primary) hypertension: Secondary | ICD-10-CM | POA: Diagnosis not present

## 2017-01-06 MED ORDER — ALBUTEROL SULFATE HFA 108 (90 BASE) MCG/ACT IN AERS: INHALATION_SPRAY | RESPIRATORY_TRACT | 6 refills | Status: DC

## 2017-01-06 MED ORDER — TRIAMCINOLONE ACETONIDE 55 MCG/ACT NA AERO: 2.0000 | INHALATION_SPRAY | Freq: Every day | NASAL | 12 refills | Status: DC

## 2017-01-06 MED ORDER — CETIRIZINE HCL 10 MG PO TABS: 10.0000 mg | ORAL_TABLET | Freq: Every day | ORAL | 3 refills | Status: AC | PRN

## 2017-01-06 MED ORDER — TIOTROPIUM BROMIDE MONOHYDRATE 18 MCG IN CAPS: 18.0000 ug | ORAL_CAPSULE | Freq: Every day | RESPIRATORY_TRACT | 3 refills | Status: DC

## 2017-01-06 MED ORDER — PREDNISONE 10 MG PO TABS: ORAL_TABLET | ORAL | 0 refills | Status: DC

## 2017-01-06 MED ORDER — METHYLPREDNISOLONE ACETATE 80 MG/ML IJ SUSP
80.0000 mg | Freq: Once | INTRAMUSCULAR | Status: AC
  Administered 2017-01-06: 80 mg via INTRAMUSCULAR

## 2017-01-06 MED ORDER — BENZONATATE 100 MG PO CAPS: ORAL_CAPSULE | ORAL | 1 refills | Status: DC

## 2017-01-06 MED ORDER — BUDESONIDE-FORMOTEROL FUMARATE 160-4.5 MCG/ACT IN AERO: 2.0000 | INHALATION_SPRAY | Freq: Two times a day (BID) | RESPIRATORY_TRACT | 12 refills | Status: DC

## 2017-01-06 NOTE — Assessment & Plan Note (Signed)
Mild situationally worse, declines need for tx change at this time

## 2017-01-06 NOTE — Progress Notes (Signed)
Subjective:    Patient ID: [name], male    DOB: Aug 09, 1941, 76 y.o.   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   Pt denies fever, wt loss, night sweats, loss of appetite, or other constitutional symptoms  Pt denies chest pain, orthopnea, PND, increased LE swelling, palpitations, dizziness or syncope.  Denies worsening depressive symptoms, suicidal ideation, or panic; has ongoing anxiety, overall stable recently except for last few days Past Medical History:  Diagnosis Date  . Achalasia   . Anxiety    causes shortness  . Arthritis   . COPD (chronic obstructive pulmonary disease) ([place])    with chronic SOB  . Depression   . Dilated aortic root ([place])    43mm by echo 03/2016  . FH: cholecystectomy   . GERD (gastroesophageal reflux disease)   . Hiatal hernia   . Hypercholesterolemia   . Hypothyroidism 01/12/2016  . Lesion of right lung 06/04/2013  . MVP (mitral valve prolapse)    with severe MR s/p MV repair  . Osteopenia   . Pneumonia 06/04/2013   aspirated food 14  . S/P right inguinal herniorrhaphy   . Seasonal allergic rhinitis   . Stroke ([place]) 10/2002  . Transient ischemic attack    Past Surgical History:  Procedure Laterality Date  . BACK SURGERY     x 3  . BOTOX INJECTION  06/27/2012   Procedure: BOTOX INJECTION;  Surgeon: Garlan Fair, MD;  Location: [facility];  Service:  Endoscopy;  Laterality: N/A;  . CHOLECYSTECTOMY    . ESOPHAGOGASTRODUODENOSCOPY  01/13/2012   Procedure: ESOPHAGOGASTRODUODENOSCOPY (EGD);  Surgeon: Garlan Fair, MD;  Location: [facility];  Service: Endoscopy;  Laterality: N/A;  botox /katrina  . ESOPHAGOGASTRODUODENOSCOPY  06/27/2012   Procedure: ESOPHAGOGASTRODUODENOSCOPY (EGD);  Surgeon: Garlan Fair, MD;  Location: [facility];  Service: Endoscopy;  Laterality: N/A;  . ESOPHAGOGASTRODUODENOSCOPY (EGD) WITH ESOPHAGEAL DILATION N/A 11/22/2012   Procedure: ESOPHAGOGASTRODUODENOSCOPY (EGD) WITH BOTOX INJECTION;  Surgeon: Garlan Fair, MD;  Location: [facility];  Service: Endoscopy;  Laterality: N/A;  . Esophagogastroduodenoscopy and Savary esophageal dilation.     x9  . ESOPHAGOSCOPY W/ BOTOX INJECTION     x9 beginning 2008, most recent Sept. 2012  . eyebrow surgery  2012  . FOOT SURGERY Left    screw -fx  . LAPAROSCOPIC HELLER MYOTOMY  July 2014   [facility]  . MITRAL VALVE REPAIR  08/03/2011   Procedure: MINIMALLY INVASIVE MITRAL VALVE REPAIR (MVR);  Surgeon: Rexene Alberts, MD;  Location: [place];  Service: Open Heart Surgery;  Laterality: Right;  minimally invasive MVR  . rt inguinal herniorrhaphy  1969/1997  . TEE WITHOUT CARDIOVERSION  07/06/2011   Procedure: TRANSESOPHAGEAL ECHOCARDIOGRAM (TEE);  Surgeon: Sueanne Margarita, MD;  Location: [place];  Service: Cardiovascular;  Laterality: N/A;  . VIDEO BRONCHOSCOPY WITH ENDOBRONCHIAL NAVIGATION N/A 04/03/2014  Procedure: VIDEO BRONCHOSCOPY WITH ENDOBRONCHIAL NAVIGATION;  Surgeon: Collene Gobble, MD;  Location: [place];  Service: Thoracic;  Laterality: N/A;    reports that he quit smoking about 19 years ago. His smoking use included Cigarettes. He has a 57.00 pack-year smoking history. He has never used smokeless tobacco. He reports that he does not drink alcohol or use drugs. family history includes Cancer in his sister; Heart disease in his sister; Lung cancer in his sister;  Scleroderma (age of onset: 73) in his mother; Stroke (age of onset: 8) in his father. Allergies  Allergen Reactions  . Duac [Clindamycin Phos-Benzoyl Perox] Rash  . Contrast Media [Iodinated Diagnostic Agents] Rash    Heavy rash and itching 20 yrs ago. Needs full premeds and does well with this.  . Atorvastatin     muscle pain  . Propoxyphene N-Acetaminophen Itching    rash  . Rosuvastatin     muscle weakness  . Metrizamide Rash    radiopaque contrast agent. Heavy rash and itching 20 yrs ago. Needs full premeds and does well with this.  . Other Rash    High acid food--tomatoes, orange juice   Current Outpatient Prescriptions on File Prior to Visit  Medication Sig Dispense Refill  . calcium carbonate (TUMS EX) 750 MG chewable tablet Chew 1 tablet by mouth as needed (for gas and reflux).     . Cholecalciferol (VITAMIN D3) 1000 UNITS CAPS Take 1,000 Units by mouth daily. Reported on 09/16/2015    . clonazePAM (KLONOPIN) 0.5 MG tablet Take 1 tablet (0.5 mg total) by mouth 2 (two) times daily as needed for anxiety. 60 tablet 5  . fluticasone (FLONASE) 50 MCG/ACT nasal spray Place 2 sprays into both nostrils daily. 16 g 3  . levothyroxine (SYNTHROID, LEVOTHROID) 25 MCG tablet TAKE 1 TABLET (25 MCG TOTAL) BY MOUTH DAILY BEFORE BREAKFAST. 90 tablet 2  . lovastatin (MEVACOR) 20 MG tablet Take 1 tablet (20 mg total) by mouth at bedtime. 90 tablet 3  . Multiple Vitamins-Minerals (ICAPS AREDS 2) CAPS Take 1 capsule by mouth 2 (two) times daily.     . traMADol (ULTRAM) 50 MG tablet TAKE 1 TABLET BY MOUTH EVERY 6 HOURS AS NEEDED 60 tablet 1  . escitalopram (LEXAPRO) 10 MG tablet Take 1 tablet (10 mg total) by mouth daily. 90 tablet 3   No current facility-administered medications on file prior to visit.    Review of Systems  Constitutional: Negative for other unusual diaphoresis or sweats HENT: Negative for ear discharge or swelling Eyes: Negative for other worsening visual  disturbances Respiratory: Negative for stridor or other swelling  Gastrointestinal: Negative for worsening distension or other blood Genitourinary: Negative for retention or other urinary change Musculoskeletal: Negative for other MSK pain or swelling Skin: Negative for color change or other new lesions Neurological: Negative for worsening tremors and other numbness  Psychiatric/Behavioral: Negative for worsening agitation or other fatigue All other system neg per pt    Objective:   Physical Exam SUBJECTIVE: VS noted, not ill appearing but hard to take deep breaths Constitutional: Pt appears in NAD HENT: Head: NCAT.  Right Ear: External ear normal.  Left Ear: External ear normal.  Eyes: . Pupils are equal, round, and reactive to light. Conjunctivae and EOM are normal Nose: without d/c or deformity Neck: Neck supple. Gross normal ROM Cardiovascular: Normal rate and regular rhythm.   Pulmonary/Chest: Effort normal and breath sounds mod decreased without rales or wheezing.  Neurological: Pt is alert. At baseline  orientation, motor grossly intact Skin: Skin is warm. No rashes, other new lesions, no LE edema Psychiatric: Pt behavior is normal without agitation , mild nervous No other exam findings    Assessment & Plan:

## 2017-01-06 NOTE — Patient Instructions (Addendum)
You had the steroid shot today  Please take all new medication as prescribed - the prednisone, and the cough pills  Please continue all other medications as before, including the Albuterol inhaler, Symbicort and Spiriva  Please have the pharmacy call with any other refills you may need.  Please keep your appointments with your specialists as you may have planned  Please return in 6 months, or sooner if needed,

## 2017-01-06 NOTE — Assessment & Plan Note (Signed)
Exam c/w copd exacerbation vs new asthma/allergies or both; no fever, today for depomedrol IM 80, predpac asd, tess perle prn and restart inhalers; daughter has COPD before and agrees with all and will assist with his compliance

## 2017-01-06 NOTE — Assessment & Plan Note (Signed)
stable overall by history and exam, recent data reviewed with pt, and pt to continue medical treatment as before,  to f/u any worsening symptoms or concerns BP Readings from Last 3 Encounters:  01/06/17 116/76  11/25/16 116/76  10/06/16 112/72

## 2017-01-12 ENCOUNTER — Telehealth: Payer: Self-pay | Admitting: *Deleted

## 2017-01-12 MED ORDER — TRAMADOL HCL 50 MG PO TABS: 50.0000 mg | ORAL_TABLET | Freq: Four times a day (QID) | ORAL | 1 refills | Status: DC | PRN

## 2017-01-12 NOTE — Telephone Encounter (Signed)
Done hardcopy to Shirron  

## 2017-01-12 NOTE — Telephone Encounter (Signed)
faxed

## 2017-01-12 NOTE — Telephone Encounter (Signed)
Rec'd call pt requesting refill on his Tramadol to be sent to [facility]/[address]...Johny Chess

## 2017-01-18 NOTE — Telephone Encounter (Signed)
Pt left msg on triage stating he has been tryin g to get refills on his Tramadol. Per chart rx was approved on 5/30. Called [facility] spoke w/Terry verified if script was received. Per pharmacist they did not received. Gave verbal order from 5/30 to refill the Tramadol. Called pt inform him status on refill...Johny Chess

## 2017-02-03 ENCOUNTER — Ambulatory Visit: Payer: Self-pay | Admitting: Internal Medicine

## 2017-02-17 ENCOUNTER — Other Ambulatory Visit: Payer: Self-pay | Admitting: Internal Medicine

## 2017-02-17 NOTE — Telephone Encounter (Signed)
Done Done hardcopy to Shirron  

## 2017-02-17 NOTE — Telephone Encounter (Signed)
Faxed

## 2017-03-28 ENCOUNTER — Encounter: Payer: Self-pay | Admitting: Cardiology

## 2017-03-31 ENCOUNTER — Other Ambulatory Visit: Payer: Self-pay

## 2017-03-31 ENCOUNTER — Ambulatory Visit (HOSPITAL_COMMUNITY): Payer: BLUE CROSS/BLUE SHIELD | Attending: Cardiology

## 2017-03-31 ENCOUNTER — Other Ambulatory Visit: Payer: Self-pay | Admitting: Internal Medicine

## 2017-03-31 DIAGNOSIS — I341 Nonrheumatic mitral (valve) prolapse: Secondary | ICD-10-CM

## 2017-03-31 DIAGNOSIS — Z9889 Other specified postprocedural states: Secondary | ICD-10-CM | POA: Insufficient documentation

## 2017-03-31 DIAGNOSIS — I420 Dilated cardiomyopathy: Secondary | ICD-10-CM | POA: Insufficient documentation

## 2017-03-31 DIAGNOSIS — I77810 Thoracic aortic ectasia: Secondary | ICD-10-CM

## 2017-03-31 NOTE — Telephone Encounter (Signed)
Done hardcopy to Shirron  

## 2017-03-31 NOTE — Telephone Encounter (Signed)
Faxed

## 2017-04-04 ENCOUNTER — Telehealth: Payer: Self-pay | Admitting: Cardiology

## 2017-04-04 DIAGNOSIS — I77810 Thoracic aortic ectasia: Secondary | ICD-10-CM

## 2017-04-04 DIAGNOSIS — Z9889 Other specified postprocedural states: Secondary | ICD-10-CM

## 2017-04-04 NOTE — Telephone Encounter (Signed)
F/u message  Pt returning RN call about echo. Please call back to discuss  

## 2017-04-04 NOTE — Telephone Encounter (Signed)
-----   Message from Sueanne Margarita, MD sent at 03/31/2017  8:45 PM EDT ----- Echo showed normal LVF with stable MV repair and moderate LAE, mild RAE and RVE

## 2017-04-04 NOTE — Telephone Encounter (Signed)
Informed patient of results and verbal understanding expressed.  Repeat echo ordered to be scheduled in 1 year for dilated aortic root. Patient agrees with treatment plan.

## 2017-04-14 ENCOUNTER — Encounter: Payer: Self-pay | Admitting: Cardiology

## 2017-04-14 ENCOUNTER — Ambulatory Visit (INDEPENDENT_AMBULATORY_CARE_PROVIDER_SITE_OTHER): Payer: BLUE CROSS/BLUE SHIELD | Admitting: Cardiology

## 2017-04-14 VITALS — BP 108/68 | HR 61 | Ht 68.5 in | Wt 174.8 lb

## 2017-04-14 DIAGNOSIS — I4510 Unspecified right bundle-branch block: Secondary | ICD-10-CM | POA: Insufficient documentation

## 2017-04-14 DIAGNOSIS — I341 Nonrheumatic mitral (valve) prolapse: Secondary | ICD-10-CM

## 2017-04-14 DIAGNOSIS — I10 Essential (primary) hypertension: Secondary | ICD-10-CM | POA: Diagnosis not present

## 2017-04-14 DIAGNOSIS — I77810 Thoracic aortic ectasia: Secondary | ICD-10-CM

## 2017-04-14 NOTE — Patient Instructions (Signed)

## 2017-04-14 NOTE — Progress Notes (Signed)
Cardiology Office Note:    Date:  04/14/2017   ID:  Chris Lewis, DOB Feb 15, 1941, MRN [id number]  PCP:  Biagio Borg, MD  Cardiologist:  Fransico Him, MD   Referring MD: Biagio Borg, MD   Chief Complaint  Patient presents with  . Mitral Regurgitation    History of Present Illness:    Chris Lewis is a 76 y.o. male with a hx of MV repair for MVP with moderate to severe MR, normal coronary arteries, normal LVF and HTN. He is here today and is doing well. He denies any anginal chest pain, PND, orthopnea, LE edema, dizziness, palpitations. He has some mild chronic DOE from COPD but exercises without any problems.   Past Medical History:  Diagnosis Date  . Achalasia   . Anxiety    causes shortness  . Arthritis   . COPD (chronic obstructive pulmonary disease) (Chris Lewis)    with chronic SOB  . Depression   . Dilated aortic root ([name])    11mm by echo 03/2016  . FH: cholecystectomy   . GERD (gastroesophageal reflux disease)   . Hiatal hernia   . Hypercholesterolemia   . Hypothyroidism 01/12/2016  . Lesion of right lung 06/04/2013  . MVP (mitral valve prolapse)    with severe MR s/p MV repair  . Osteopenia   . Pneumonia 06/04/2013   aspirated food 14  . RBBB   . S/P right inguinal herniorrhaphy   . Seasonal allergic rhinitis   . Stroke ([place]) 10/2002  . Transient ischemic attack     Past Surgical History:  Procedure Laterality Date  . BACK SURGERY     x 3  . BOTOX INJECTION  06/27/2012   Procedure: BOTOX INJECTION;  Surgeon: Garlan Fair, MD;  Location: [facility];  Service: Endoscopy;  Laterality: N/A;  . CHOLECYSTECTOMY    . ESOPHAGOGASTRODUODENOSCOPY  01/13/2012   Procedure: ESOPHAGOGASTRODUODENOSCOPY (EGD);  Surgeon: Garlan Fair, MD;  Location: [facility];  Service: Endoscopy;  Laterality: N/A;  botox /katrina  . ESOPHAGOGASTRODUODENOSCOPY  06/27/2012   Procedure: ESOPHAGOGASTRODUODENOSCOPY (EGD);  Surgeon: Garlan Fair, MD;  Location: [facility];  Service: Endoscopy;  Laterality: N/A;  . ESOPHAGOGASTRODUODENOSCOPY (EGD) WITH ESOPHAGEAL DILATION N/A 11/22/2012   Procedure: ESOPHAGOGASTRODUODENOSCOPY (EGD) WITH BOTOX INJECTION;  Surgeon: Garlan Fair, MD;  Location: [facility];  Service: Endoscopy;  Laterality: N/A;  . Esophagogastroduodenoscopy and Savary esophageal dilation.     x9  . ESOPHAGOSCOPY W/ BOTOX INJECTION     x9 beginning 2008, most recent Sept. 2012  . eyebrow surgery  2012  . FOOT SURGERY Left    screw -fx  . LAPAROSCOPIC HELLER MYOTOMY  July 2014   [facility]  . MITRAL VALVE REPAIR  08/03/2011   Procedure: MINIMALLY INVASIVE MITRAL VALVE REPAIR (MVR);  Surgeon: Rexene Alberts, MD;  Location: [place];  Service: Open Heart Surgery;  Laterality: Right;  minimally invasive MVR  . rt inguinal herniorrhaphy  1969/1997  . TEE WITHOUT CARDIOVERSION  07/06/2011   Procedure: TRANSESOPHAGEAL ECHOCARDIOGRAM (TEE);  Surgeon: Sueanne Margarita, MD;  Location: [place];  Service: Cardiovascular;  Laterality: N/A;  . VIDEO BRONCHOSCOPY WITH ENDOBRONCHIAL NAVIGATION N/A 04/03/2014   Procedure: VIDEO BRONCHOSCOPY WITH ENDOBRONCHIAL NAVIGATION;  Surgeon: Collene Gobble, MD;  Location: [facility];  Service: Thoracic;  Laterality: N/A;    Current Medications: Current Meds  Medication Sig  . albuterol (VENTOLIN HFA) 108 (90 Base) MCG/ACT inhaler INHALE 2 PUFFS INTO THE LUNGS EVERY 4 (FOUR)  HOURS AS NEEDED FOR WHEEZING.  . calcium carbonate (TUMS EX) 750 MG chewable tablet Chew 1 tablet by mouth as needed (for gas and reflux).   . cetirizine (ZYRTEC) 10 MG tablet Take 1 tablet (10 mg total) by mouth daily as needed for allergies. Reported on 10/27/2015  . Cholecalciferol (VITAMIN D3) 1000 UNITS CAPS Take 1,000 Units by mouth daily. Reported on 09/16/2015  . clonazePAM (KLONOPIN) 0.5 MG tablet take 1 tablet by mouth twice a day if needed  . fluticasone (FLONASE) 50 MCG/ACT nasal spray Place 2 sprays into both nostrils daily.  Chris Lewis  levothyroxine (SYNTHROID, LEVOTHROID) 25 MCG tablet TAKE 1 TABLET (25 MCG TOTAL) BY MOUTH DAILY BEFORE BREAKFAST.  . Multiple Vitamins-Minerals (ICAPS AREDS 2) CAPS Take 1 capsule by mouth 2 (two) times daily.   . predniSONE (DELTASONE) 10 MG tablet 3 tabs by mouth per day for 3 days,2tabs per day for 3 days,1tab per day for 3 days  . tiotropium (SPIRIVA HANDIHALER) 18 MCG inhalation capsule Place 1 capsule (18 mcg total) into inhaler and inhale daily.  . traMADol (ULTRAM) 50 MG tablet take 1 tablet by mouth every 6 hours if needed     Allergies:   Duac [clindamycin phos-benzoyl perox]; Contrast media [iodinated diagnostic agents]; Atorvastatin; Propoxyphene n-acetaminophen; Rosuvastatin; Metrizamide; and Other   Social History   Social History  . Marital status: Married    Spouse name: N/A  . Number of children: 2  . Years of education: N/A   Occupational History  .  Retired   Social History Main Topics  . Smoking status: Former Smoker    Packs/day: 1.50    Years: 38.00    Types: Cigarettes    Quit date: 08/29/1997  . Smokeless tobacco: Never Used  . Alcohol use No  . Drug use: No  . Sexual activity: Not Asked   Other Topics Concern  . None   Social History Narrative  . None     Family History: The patient's family history includes Cancer in his sister; Heart disease in his sister; Lung cancer in his sister; Scleroderma (age of onset: 40) in his mother; Stroke (age of onset: 61) in his father. There is no history of Anesthesia problems.  ROS:   Please see the history of present illness.     All other systems reviewed and are negative.  EKGs/Labs/Other Studies Reviewed:    The following studies were reviewed today: none  EKG:  EKG is  ordered today.  The ekg ordered today demonstrates NSR with RBBB  Recent Labs: 05/25/2016: TSH 4.26   Recent Lipid Panel    Component Value Date/Time   CHOL 205 (H) 12/18/2015 1617   TRIG 167.0 (H) 12/18/2015 1617   HDL 33.80  (L) 12/18/2015 1617   CHOLHDL 6 12/18/2015 1617   VLDL 33.4 12/18/2015 1617   LDLCALC 138 (H) 12/18/2015 1617    Physical Exam:    VS:  BP 108/68   Pulse 61   Ht 5' 8.5" (1.74 m)   Wt 174 lb 12.8 oz (79.3 kg)   BMI 26.19 kg/m     Wt Readings from Last 3 Encounters:  04/14/17 174 lb 12.8 oz (79.3 kg)  01/06/17 176 lb (79.8 kg)  11/25/16 170 lb (77.1 kg)     GEN:  Well nourished, well developed in no acute distress HEENT: Normal NECK: No JVD; No carotid bruits LYMPHATICS: No lymphadenopathy CARDIAC: RRR, no murmurs, rubs, gallops RESPIRATORY:  Clear to auscultation without rales, wheezing or rhonchi  ABDOMEN: Soft, non-tender, non-distended MUSCULOSKELETAL:  No edema; No deformity  SKIN: Warm and dry NEUROLOGIC:  Alert and oriented x 3 PSYCHIATRIC:  Normal affect   ASSESSMENT:    1. MVP (mitral valve prolapse)   2. HYPERTENSION, BENIGN   3. Dilated aortic root ([place])   4. RBBB    PLAN:    In order of problems listed above:  1. MVP with severe MR s/p MV repair - he is doing well with no complaints. Recent 2D echo showed stable MV repair.   2. HTN - BP well controlled on current meds. He is on no antihypertensive meds at this time. 3. Dilated aortic root - 19mm by echo 03/2017.  Repeat echo in 1 year.  4. Chronic RBBB   Medication Adjustments/Labs and Tests Ordered: Current medicines are reviewed at length with the patient today.  Concerns regarding medicines are outlined above.  No orders of the defined types were placed in this encounter.  No orders of the defined types were placed in this encounter.   Signed, Fransico Him, MD  04/14/2017 12:55 PM    [place]

## 2017-04-28 ENCOUNTER — Other Ambulatory Visit: Payer: Self-pay | Admitting: Internal Medicine

## 2017-04-29 NOTE — Telephone Encounter (Signed)
Faxed

## 2017-04-29 NOTE — Telephone Encounter (Signed)
Done hardcopy to Shirron  

## 2017-05-16 ENCOUNTER — Telehealth: Payer: Self-pay | Admitting: Pulmonary Disease

## 2017-05-16 MED ORDER — ALBUTEROL SULFATE HFA 108 (90 BASE) MCG/ACT IN AERS: INHALATION_SPRAY | RESPIRATORY_TRACT | 2 refills | Status: DC

## 2017-05-16 NOTE — Telephone Encounter (Signed)
Spoke with pt. He is needing a refill on Ventolin. Rx has been sent in. Nothing further was needed. 

## 2017-06-23 ENCOUNTER — Other Ambulatory Visit: Payer: Self-pay | Admitting: Internal Medicine

## 2017-06-23 NOTE — Telephone Encounter (Signed)
Done hardcopy to Shirron  

## 2017-06-23 NOTE — Telephone Encounter (Signed)
Faxed

## 2017-07-06 ENCOUNTER — Encounter: Payer: Self-pay | Admitting: Internal Medicine

## 2017-07-06 ENCOUNTER — Ambulatory Visit (INDEPENDENT_AMBULATORY_CARE_PROVIDER_SITE_OTHER): Payer: BLUE CROSS/BLUE SHIELD | Admitting: Internal Medicine

## 2017-07-06 ENCOUNTER — Other Ambulatory Visit (INDEPENDENT_AMBULATORY_CARE_PROVIDER_SITE_OTHER): Payer: BLUE CROSS/BLUE SHIELD

## 2017-07-06 VITALS — BP 124/78 | HR 60 | Temp 97.6°F | Ht 68.5 in | Wt 172.0 lb

## 2017-07-06 DIAGNOSIS — R739 Hyperglycemia, unspecified: Secondary | ICD-10-CM | POA: Diagnosis not present

## 2017-07-06 DIAGNOSIS — G4700 Insomnia, unspecified: Secondary | ICD-10-CM

## 2017-07-06 DIAGNOSIS — I10 Essential (primary) hypertension: Secondary | ICD-10-CM | POA: Diagnosis not present

## 2017-07-06 DIAGNOSIS — J019 Acute sinusitis, unspecified: Secondary | ICD-10-CM | POA: Insufficient documentation

## 2017-07-06 DIAGNOSIS — J449 Chronic obstructive pulmonary disease, unspecified: Secondary | ICD-10-CM

## 2017-07-06 DIAGNOSIS — E039 Hypothyroidism, unspecified: Secondary | ICD-10-CM

## 2017-07-06 DIAGNOSIS — Z23 Encounter for immunization: Secondary | ICD-10-CM | POA: Diagnosis not present

## 2017-07-06 DIAGNOSIS — Z0001 Encounter for general adult medical examination with abnormal findings: Secondary | ICD-10-CM

## 2017-07-06 LAB — CBC WITH DIFFERENTIAL/PLATELET
BASOS PCT: 0.6 % (ref 0.0–3.0)
Basophils Absolute: 0 10*3/uL (ref 0.0–0.1)
EOS PCT: 2.7 % (ref 0.0–5.0)
Eosinophils Absolute: 0.2 10*3/uL (ref 0.0–0.7)
HCT: 50.8 % (ref 39.0–52.0)
Hemoglobin: 16.6 g/dL (ref 13.0–17.0)
LYMPHS ABS: 1.9 10*3/uL (ref 0.7–4.0)
Lymphocytes Relative: 24.8 % (ref 12.0–46.0)
MCHC: 32.7 g/dL (ref 30.0–36.0)
MCV: 92.7 fl (ref 78.0–100.0)
MONOS PCT: 10.4 % (ref 3.0–12.0)
Monocytes Absolute: 0.8 10*3/uL (ref 0.1–1.0)
NEUTROS ABS: 4.8 10*3/uL (ref 1.4–7.7)
NEUTROS PCT: 61.5 % (ref 43.0–77.0)
PLATELETS: 158 10*3/uL (ref 150.0–400.0)
RBC: 5.48 Mil/uL (ref 4.22–5.81)
RDW: 13.6 % (ref 11.5–15.5)
WBC: 7.7 10*3/uL (ref 4.0–10.5)

## 2017-07-06 LAB — URINALYSIS, ROUTINE W REFLEX MICROSCOPIC
Bilirubin Urine: NEGATIVE
Hgb urine dipstick: NEGATIVE
Ketones, ur: NEGATIVE
LEUKOCYTES UA: NEGATIVE
Nitrite: NEGATIVE
RBC / HPF: NONE SEEN (ref 0–?)
TOTAL PROTEIN, URINE-UPE24: NEGATIVE
URINE GLUCOSE: NEGATIVE
Urobilinogen, UA: 0.2 (ref 0.0–1.0)
WBC, UA: NONE SEEN (ref 0–?)
pH: 5.5 (ref 5.0–8.0)

## 2017-07-06 LAB — LIPID PANEL
CHOL/HDL RATIO: 7
Cholesterol: 214 mg/dL — ABNORMAL HIGH (ref 0–200)
HDL: 30.2 mg/dL — AB (ref >39.00)
LDL Cholesterol: 150 mg/dL — ABNORMAL HIGH (ref 0–99)
NonHDL: 183.88
TRIGLYCERIDES: 169 mg/dL — AB (ref 0.0–149.0)
VLDL: 33.8 mg/dL (ref 0.0–40.0)

## 2017-07-06 LAB — BASIC METABOLIC PANEL
BUN: 18 mg/dL (ref 6–23)
CALCIUM: 9.7 mg/dL (ref 8.4–10.5)
CHLORIDE: 103 meq/L (ref 96–112)
CO2: 31 meq/L (ref 19–32)
Creatinine, Ser: 1.17 mg/dL (ref 0.40–1.50)
GFR: 64.05 mL/min (ref >60.00)
Glucose, Bld: 102 mg/dL — ABNORMAL HIGH (ref 70–99)
Potassium: 5.2 mEq/L — ABNORMAL HIGH (ref 3.5–5.1)
SODIUM: 141 meq/L (ref 135–145)

## 2017-07-06 LAB — HEPATIC FUNCTION PANEL
ALBUMIN: 4.5 g/dL (ref 3.5–5.2)
ALT: 15 U/L (ref 0–53)
AST: 22 U/L (ref 0–37)
Alkaline Phosphatase: 79 U/L (ref 39–117)
Bilirubin, Direct: 0.1 mg/dL (ref 0.0–0.3)
Total Bilirubin: 0.8 mg/dL (ref 0.2–1.2)
Total Protein: 6.9 g/dL (ref 6.0–8.3)

## 2017-07-06 LAB — HEMOGLOBIN A1C: Hgb A1c MFr Bld: 6 % (ref 4.6–6.5)

## 2017-07-06 LAB — PSA: PSA: 0.87 ng/mL (ref 0.10–4.00)

## 2017-07-06 LAB — TSH: TSH: 4.93 u[IU]/mL — ABNORMAL HIGH (ref 0.35–4.50)

## 2017-07-06 MED ORDER — DOXYCYCLINE HYCLATE 100 MG PO TABS
100.0000 mg | ORAL_TABLET | Freq: Two times a day (BID) | ORAL | 0 refills | Status: DC
Start: 2017-07-06 — End: 2018-01-17

## 2017-07-06 NOTE — Progress Notes (Signed)
Subjective:    Patient ID: Chris Lewis, male    DOB: [birth year], 76 y.o.   MRN: [id number]  HPI  Here for wellness and f/u;  Overall doing ok;  Pt denies Chest pain, worsening SOB, DOE, wheezing, orthopnea, PND, worsening LE edema, palpitations, dizziness or syncope.  Pt denies neurological change such as new headache, facial or extremity weakness.  Pt denies polydipsia, polyuria, or low sugar symptoms. Pt states overall good compliance with treatment and medications, good tolerability, and has been trying to follow appropriate diet.  Pt denies worsening depressive symptoms, suicidal ideation or panic. No fever, night sweats, wt loss, loss of appetite, or other constitutional symptoms.  Pt states good ability with ADL's, has low fall risk, home safety reviewed and adequate, no other significant changes in hearing or vision, and only occasionally active with exercise. Also,  Here with incidental 2-3 days acute onset fever, facial pain, pressure, headache, general weakness and malaise, and greenish d/c, with mild ST and cough.  Does also have c/o Insomnia most night, just cant seem to get to sleep.  Denies hyper or hypo thyroid symptoms such as voice, skin or hair change. Past Medical History:  Diagnosis Date  . Achalasia   . Anxiety    causes shortness  . Arthritis   . COPD (chronic obstructive pulmonary disease) ([place])    with chronic SOB  . Depression   . Dilated aortic root ([place])    41mm by echo 03/2016  . FH: cholecystectomy   . GERD (gastroesophageal reflux disease)   . Hiatal hernia   . Hypercholesterolemia   . Hypothyroidism 01/12/2016  . Lesion of right lung 06/04/2013  . MVP (mitral valve prolapse)    with severe MR s/p MV repair  . Osteopenia   . Pneumonia 06/04/2013   aspirated food 14  . RBBB   . S/P right inguinal herniorrhaphy   . Seasonal allergic rhinitis   . Stroke ([place]) 10/2002  . Transient ischemic attack    Past Surgical History:  Procedure Laterality Date  .  BACK SURGERY     x 3  . BOTOX INJECTION  06/27/2012   Procedure: BOTOX INJECTION;  Surgeon: Garlan Fair, MD;  Location: [facility];  Service: Endoscopy;  Laterality: N/A;  . CHOLECYSTECTOMY    . ESOPHAGOGASTRODUODENOSCOPY  01/13/2012   Procedure: ESOPHAGOGASTRODUODENOSCOPY (EGD);  Surgeon: Garlan Fair, MD;  Location: [facility];  Service: Endoscopy;  Laterality: N/A;  botox /katrina  . ESOPHAGOGASTRODUODENOSCOPY  06/27/2012   Procedure: ESOPHAGOGASTRODUODENOSCOPY (EGD);  Surgeon: Garlan Fair, MD;  Location: [facility];  Service: Endoscopy;  Laterality: N/A;  . ESOPHAGOGASTRODUODENOSCOPY (EGD) WITH ESOPHAGEAL DILATION N/A 11/22/2012   Procedure: ESOPHAGOGASTRODUODENOSCOPY (EGD) WITH BOTOX INJECTION;  Surgeon: Garlan Fair, MD;  [facility]: [facility];  Service: Endoscopy;  Laterality: N/A;  . Esophagogastroduodenoscopy and Savary esophageal dilation.     x9  . ESOPHAGOSCOPY W/ BOTOX INJECTION     x9 beginning 2008, most recent Sept. 2012  . eyebrow surgery  2012  . FOOT SURGERY Left    screw -fx  . LAPAROSCOPIC HELLER MYOTOMY  July 2014   [facility]  . MITRAL VALVE REPAIR  08/03/2011   Procedure: MINIMALLY INVASIVE MITRAL VALVE REPAIR (MVR);  Surgeon: Rexene Alberts, MD;  Location: [place];  Service: Open Heart Surgery;  Laterality: Right;  minimally invasive MVR  . rt inguinal herniorrhaphy  1969/1997  . TEE WITHOUT CARDIOVERSION  07/06/2011   Procedure: TRANSESOPHAGEAL ECHOCARDIOGRAM (TEE);  Surgeon: Eber Hong  Radford Pax, MD;  Location: [place];  Service: Cardiovascular;  Laterality: N/A;  . VIDEO BRONCHOSCOPY WITH ENDOBRONCHIAL NAVIGATION N/A 04/03/2014   Procedure: VIDEO BRONCHOSCOPY WITH ENDOBRONCHIAL NAVIGATION;  Surgeon: Collene Gobble, MD;  Location: [place];  Service: Thoracic;  Laterality: N/A;    reports that he quit smoking about 19 years ago. His smoking use included cigarettes. He has a 57.00 pack-year smoking history. he has never used smokeless tobacco. He  reports that he does not drink alcohol or use drugs. family history includes Cancer in his sister; Heart disease in his sister; Lung cancer in his sister; Scleroderma (age of onset: 5) in his mother; Stroke (age of onset: 62) in his father. Allergies  Allergen Reactions  . Duac [Clindamycin Phos-Benzoyl Perox] Rash  . Contrast Media [Iodinated Diagnostic Agents] Rash    Heavy rash and itching 20 yrs ago. Needs full premeds and does well with this.  . Atorvastatin     muscle pain  . Propoxyphene N-Acetaminophen Itching    rash  . Rosuvastatin     muscle weakness  . Metrizamide Rash    radiopaque contrast agent. Heavy rash and itching 20 yrs ago. Needs full premeds and does well with this.  . Other Rash    High acid food--tomatoes, orange juice   Current Outpatient Medications on File Prior to Visit  Medication Sig Dispense Refill  . albuterol (VENTOLIN HFA) 108 (90 Base) MCG/ACT inhaler INHALE 2 PUFFS INTO THE LUNGS EVERY 4 (FOUR) HOURS AS NEEDED FOR WHEEZING. 1 Inhaler 2  . calcium carbonate (TUMS EX) 750 MG chewable tablet Chew 1 tablet by mouth as needed (for gas and reflux).     . cetirizine (ZYRTEC) 10 MG tablet Take 1 tablet (10 mg total) by mouth daily as needed for allergies. Reported on 10/27/2015 90 tablet 3  . Cholecalciferol (VITAMIN D3) 1000 UNITS CAPS Take 1,000 Units by mouth daily. Reported on 09/16/2015    . clonazePAM (KLONOPIN) 0.5 MG tablet take 1 tablet by mouth twice a day if needed 60 tablet 1  . escitalopram (LEXAPRO) 10 MG tablet Take 10 mg by mouth daily.    . fluticasone (FLONASE) 50 MCG/ACT nasal spray Place 2 sprays into both nostrils daily. 16 g 3  . levothyroxine (SYNTHROID, LEVOTHROID) 25 MCG tablet TAKE 1 TABLET (25 MCG TOTAL) BY MOUTH DAILY BEFORE BREAKFAST. 90 tablet 2  . loteprednol (LOTEMAX) 0.5 % ophthalmic suspension 4 (four) times daily.    . metroNIDAZOLE (METROGEL) 0.75 % gel Apply 1 application topically 2 (two) times daily.    . Multiple  Vitamins-Minerals (ICAPS AREDS 2) CAPS Take 1 capsule by mouth 2 (two) times daily.     Marland Kitchen tiotropium (SPIRIVA HANDIHALER) 18 MCG inhalation capsule Place 1 capsule (18 mcg total) into inhaler and inhale daily. 90 capsule 3  . traMADol (ULTRAM) 50 MG tablet take 1 tablet by mouth every 6 hours if needed 60 tablet 1   No current facility-administered medications on file prior to visit.    Review of Systems Constitutional: Negative for other unusual diaphoresis, sweats, appetite or weight changes HENT: Negative for other worsening hearing loss, ear pain, facial swelling, mouth sores or neck stiffness.   Eyes: Negative for other worsening pain, redness or other visual disturbance.  Respiratory: Negative for other stridor or swelling Cardiovascular: Negative for other palpitations or other chest pain  Gastrointestinal: Negative for worsening diarrhea or loose stools, blood in stool, distention or other pain Genitourinary: Negative for hematuria, flank pain or other  change in urine volume.  Musculoskeletal: Negative for myalgias or other joint swelling.  Skin: Negative for other color change, or other wound or worsening drainage.  Neurological: Negative for other syncope or numbness. Hematological: Negative for other adenopathy or swelling Psychiatric/Behavioral: Negative for hallucinations, other worsening agitation, SI, self-injury, or new decreased concentration No other exam findings    Objective:   Physical Exam BP 124/78   Pulse 60   Temp 97.6 F (36.4 C) (Oral)   Ht 5' 8.5" (1.74 m)   Wt 172 lb (78 kg)   SpO2 98%   BMI 25.77 kg/m  VS noted,  Constitutional: Pt is oriented to person, place, and time. Appears well-developed and well-nourished, in no significant distress and comfortable Head: Normocephalic and atraumatic  Eyes: Conjunctivae and EOM are normal. Pupils are equal, round, and reactive to light Right Ear: External ear normal without discharge Left Ear: External ear  normal without discharge Nose: Nose without discharge or deformity Mouth/Throat: Oropharynx is without other ulcerations and moist  Bilat tm's with mild erythema.  Max sinus areas mild tender.  Pharynx with mild erythema, no exudate Neck: Normal range of motion. Neck supple. No JVD present. No tracheal deviation present or significant neck LA or mass Cardiovascular: Normal rate, regular rhythm, normal heart sounds and intact distal pulses.   Pulmonary/Chest: WOB normal and breath sounds without rales or wheezing  Abdominal: Soft. Bowel sounds are normal. NT. No HSM  Musculoskeletal: Normal range of motion. Exhibits no edema Lymphadenopathy: Has no other cervical adenopathy.  Neurological: Pt is alert and oriented to person, place, and time. Pt has normal reflexes. No cranial nerve deficit. Motor grossly intact, Gait intact Skin: Skin is warm and dry. No rash noted or new ulcerations Psychiatric:  Has normal mood and affect. Behavior is normal without agitation' No other exam findings       Assessment & Plan:

## 2017-07-06 NOTE — Patient Instructions (Addendum)
Please take all new medication as prescribed - the antibiotic  You had the Tdap Tetanus shot today  Please continue all other medications as before, and refills have been done if requested.  Please have the pharmacy call with any other refills you may need.  Please continue your efforts at being more active, low cholesterol diet, and weight control.  You are otherwise up to date with prevention measures today.  Please keep your appointments with your specialists as you may have planned  Please go to the LAB in the Basement (turn left off the elevator) for the tests to be done today  You will be contacted by phone if any changes need to be made immediately.  Otherwise, you will receive a letter about your results with an explanation, but please check with MyChart first.  Please remember to sign up for MyChart if you have not done so, as this will be important to you in the future with finding out test results, communicating by private email, and scheduling acute appointments online when needed.  Please return in 1 year for your yearly visit, or sooner if needed, with Lab testing done 3-5 days before

## 2017-07-07 ENCOUNTER — Encounter: Payer: Self-pay | Admitting: Internal Medicine

## 2017-07-07 DIAGNOSIS — G4700 Insomnia, unspecified: Secondary | ICD-10-CM | POA: Insufficient documentation

## 2017-07-07 NOTE — Assessment & Plan Note (Signed)
D/w pt, ok to try otc melatonin or benadryl

## 2017-07-07 NOTE — Assessment & Plan Note (Signed)

## 2017-07-07 NOTE — Assessment & Plan Note (Signed)
stable overall by history and exam, and pt to continue medical treatment as before,  to f/u any worsening symptoms or concerns 

## 2017-07-07 NOTE — Assessment & Plan Note (Signed)
Lab Results  Component Value Date   HGBA1C 6.0 07/06/2017  stable overall by history and exam, recent data reviewed with pt, and pt to continue medical treatment as before,  Cont to work on diet and wt control, to f/u any worsening symptoms or concerns

## 2017-07-07 NOTE — Assessment & Plan Note (Signed)
stable overall by history and exam, recent data reviewed with pt, and pt to continue medical treatment as before,  to f/u any worsening symptoms or concerns BP Readings from Last 3 Encounters:  07/06/17 124/78  04/14/17 108/68  01/06/17 116/76

## 2017-07-07 NOTE — Assessment & Plan Note (Addendum)
Mild to mod, for antibx course,  to f/u any worsening symptoms or concerns  In addition to the time spent performing CPE, I spent an additional 15 minutes face to face,in which greater than 50% of this time was spent in counseling and coordination of care for patient's illness as documented, including the differential dx, tx, further evaluation and other management of acute sinus infection, hyperglycemia, HTN, COPD, hypothyroidism and insomnia

## 2017-07-28 ENCOUNTER — Other Ambulatory Visit: Payer: Self-pay | Admitting: Internal Medicine

## 2017-08-01 DIAGNOSIS — H04123 Dry eye syndrome of bilateral lacrimal glands: Secondary | ICD-10-CM | POA: Diagnosis not present

## 2017-08-23 ENCOUNTER — Other Ambulatory Visit: Payer: Self-pay | Admitting: Internal Medicine

## 2017-08-23 NOTE — Telephone Encounter (Signed)
Both Done erx 

## 2017-09-13 ENCOUNTER — Ambulatory Visit: Payer: Self-pay | Admitting: *Deleted

## 2017-09-13 NOTE — Telephone Encounter (Signed)
Patient returned call and stating "I threw up this morning while eating and it had a big glob phlegm. I tried to eat again, felt a lump in my throat, went to BR and vomited again with phlegm and food. I came home and was starving, so I took a few bites of a cookie and coke. I got that same lump feeling and went to the BR and vomited again, which this time it wasn't as much phlegm. I have an appointment tomorrow." I asked is he nauseated, he said "no, but my stomach doesn't feel quite right." I advised him to only drink clear liquids the rest of the day and night to keep solids off his stomach and the doctor will evaluate him tomorrow, he verbalized understanding.

## 2017-09-13 NOTE — Telephone Encounter (Addendum)
Attempted to contact pt regarding his previous call dated 09/13/17, "Pt calling and states that he has been spitting up in the morning thick, discolored mucus for several days. This morning he began to vomit and cannot hold food down. He believes it's due to the film in his throat. Pt would like a call with advice on what he can do for today. Scheduled him with Dr. Jenny Reichmann for tomorrow at [facility]."; left message for pt to call office back; unable to complete nurse triage at this time; pt previously scheduled for appointment with Dr Jenny Reichmann, per Ander Purpura, 09/04/17 at 1000.

## 2017-09-14 ENCOUNTER — Other Ambulatory Visit: Payer: Self-pay | Admitting: Acute Care

## 2017-09-14 ENCOUNTER — Ambulatory Visit (INDEPENDENT_AMBULATORY_CARE_PROVIDER_SITE_OTHER): Payer: BLUE CROSS/BLUE SHIELD | Admitting: Internal Medicine

## 2017-09-14 VITALS — BP 114/76 | HR 64 | Temp 97.8°F | Ht 68.5 in | Wt 176.0 lb

## 2017-09-14 DIAGNOSIS — K220 Achalasia of cardia: Secondary | ICD-10-CM

## 2017-09-14 DIAGNOSIS — R1111 Vomiting without nausea: Secondary | ICD-10-CM | POA: Diagnosis not present

## 2017-09-14 DIAGNOSIS — J301 Allergic rhinitis due to pollen: Secondary | ICD-10-CM

## 2017-09-14 DIAGNOSIS — R05 Cough: Secondary | ICD-10-CM | POA: Diagnosis not present

## 2017-09-14 DIAGNOSIS — R1310 Dysphagia, unspecified: Secondary | ICD-10-CM | POA: Diagnosis not present

## 2017-09-14 DIAGNOSIS — R739 Hyperglycemia, unspecified: Secondary | ICD-10-CM | POA: Diagnosis not present

## 2017-09-14 DIAGNOSIS — R059 Cough, unspecified: Secondary | ICD-10-CM

## 2017-09-14 DIAGNOSIS — I10 Essential (primary) hypertension: Secondary | ICD-10-CM | POA: Diagnosis not present

## 2017-09-14 MED ORDER — FLUTICASONE PROPIONATE 50 MCG/ACT NA SUSP: 2.0000 | Freq: Every day | NASAL | 3 refills | Status: DC

## 2017-09-14 MED ORDER — ALBUTEROL SULFATE HFA 108 (90 BASE) MCG/ACT IN AERS: INHALATION_SPRAY | RESPIRATORY_TRACT | 0 refills | Status: DC

## 2017-09-14 MED ORDER — HYDROCODONE-HOMATROPINE 5-1.5 MG/5ML PO SYRP: 5.0000 mL | ORAL_SOLUTION | Freq: Four times a day (QID) | ORAL | 0 refills | Status: AC | PRN

## 2017-09-14 MED ORDER — AZITHROMYCIN 250 MG PO TABS: ORAL_TABLET | ORAL | 1 refills | Status: DC

## 2017-09-14 NOTE — Patient Instructions (Signed)
Please take all new medication as prescribed - the antibiotic, cough medicine as needed  You can also take Mucinex (or it's generic off brand) for congestion, and tylenol as needed for pain.  Please continue all other medications as before, and refills have been done if requested - the flonase  Please have the pharmacy call with any other refills you may need.  Please keep your appointments with your specialists as you may have planned  You will be contacted regarding the referral for: Gastroenterology - urgent;  Please go to Emergency Room if you are further unable to take liquids or solids

## 2017-09-14 NOTE — Assessment & Plan Note (Signed)
Suspect mechanical issue - for GI referrral urgent, may need EGD

## 2017-09-14 NOTE — Assessment & Plan Note (Signed)
Mild to mod, for restart flonase asd, to f/u any worsening symptoms or concerns

## 2017-09-14 NOTE — Assessment & Plan Note (Signed)
stable overall by history and exam, recent data reviewed with pt, and pt to continue medical treatment as before,  to f/u any worsening symptoms or concerns BP Readings from Last 3 Encounters:  09/14/17 114/76  07/06/17 124/78  04/14/17 108/68

## 2017-09-14 NOTE — Assessment & Plan Note (Addendum)
Suspect mechanical issue - for GI referrral urgent, may need EGD

## 2017-09-14 NOTE — Assessment & Plan Note (Signed)
Mild to mod, c/w bronchitis vs pna (? Aspirate), declines cxr, for antibx course, cough med prn, and mucinex otc prn,  to f/u any worsening symptoms or concerns

## 2017-09-14 NOTE — Progress Notes (Signed)
Subjective:    Patient ID: Chris Lewis, male    DOB: 29-Apr-1941, 77 y.o.   MRN: [id number]  HPI  Here with acute onset mild to mod 2-3 days ST, HA, general weakness and malaise, with prod cough greenish sputum, but Pt denies chest pain, increased sob or doe, wheezing, orthopnea, PND, increased LE swelling, palpitations, dizziness or syncope.  Does have several wks ongoing nasal allergy symptoms with clearish congestion, itch and sneezing, without fever, pain, ST, cough, swelling or wheezing.  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  Has hx of Achalasia with tx involved with GI locally and [facility], with multiple botox admins, then myotomy.  Has done relatively well since surgury until very recent.  No reduced appetite or wt loss. Denies worsening reflux, other abd pain, n/v, bowel change or blood.   Pt denies polydipsia, polyuria. Past Medical History:  Diagnosis Date  . Achalasia   . Anxiety    causes shortness  . Arthritis   . COPD (chronic obstructive pulmonary disease) ([place])    with chronic SOB  . Depression   . Dilated aortic root ([place])    5mm by echo 03/2016  . FH: cholecystectomy   . GERD (gastroesophageal reflux disease)   . Hiatal hernia   . Hypercholesterolemia   . Hypothyroidism 01/12/2016  . Lesion of right lung 06/04/2013  . MVP (mitral valve prolapse)    with severe MR s/p MV repair  . Osteopenia   . Pneumonia 06/04/2013   aspirated food 14  . RBBB   . S/P right inguinal herniorrhaphy   . Seasonal allergic rhinitis   . Stroke ([place]) 10/2002  . Transient ischemic attack    Past Surgical History:  Procedure Laterality Date  . BACK SURGERY     x 3  . BOTOX INJECTION  06/27/2012   Procedure: BOTOX INJECTION;  Surgeon:  Garlan Fair, MD;  [facility]: [facility];  Service: Endoscopy;  Laterality: N/A;  . CHOLECYSTECTOMY    . ESOPHAGOGASTRODUODENOSCOPY  01/13/2012   Procedure: ESOPHAGOGASTRODUODENOSCOPY (EGD);  Surgeon: Garlan Fair, MD;  Location: [facility];  Service: Endoscopy;  Laterality: N/A;  botox /katrina  . ESOPHAGOGASTRODUODENOSCOPY  06/27/2012   Procedure: ESOPHAGOGASTRODUODENOSCOPY (EGD);  Surgeon: Garlan Fair, MD;  Location: [facility];  Service: Endoscopy;  Laterality: N/A;  . ESOPHAGOGASTRODUODENOSCOPY (EGD) WITH ESOPHAGEAL DILATION N/A 11/22/2012   Procedure: ESOPHAGOGASTRODUODENOSCOPY (EGD) WITH BOTOX INJECTION;  Surgeon: Garlan Fair, MD;  [facility];  Service: Endoscopy;  Laterality: N/A;  . Esophagogastroduodenoscopy and Savary esophageal dilation.     x9  . ESOPHAGOSCOPY W/ BOTOX INJECTION     x9 beginning 2008, most recent Sept. 2012  . eyebrow surgery  2012  . FOOT SURGERY Left    screw -fx  . LAPAROSCOPIC HELLER MYOTOMY  July 2014   [facility]  . MITRAL VALVE REPAIR  08/03/2011   Procedure: MINIMALLY INVASIVE MITRAL VALVE REPAIR (MVR);  Surgeon: Rexene Alberts, MD;  Location: [place];  Service: Open Heart Surgery;  Laterality: Right;  minimally invasive MVR  . rt inguinal herniorrhaphy  1969/1997  . TEE WITHOUT CARDIOVERSION  07/06/2011   Procedure: TRANSESOPHAGEAL ECHOCARDIOGRAM (TEE);  Surgeon: Sueanne Margarita, MD;  Location: [place];  Service: Cardiovascular;  Laterality: N/A;  . VIDEO BRONCHOSCOPY WITH ENDOBRONCHIAL NAVIGATION N/A 04/03/2014   Procedure: VIDEO BRONCHOSCOPY WITH ENDOBRONCHIAL NAVIGATION;  Surgeon: Collene Gobble, MD;  Location: [place];  Service: Thoracic;  Laterality: N/A;    reports that he quit smoking about 20 years ago. His smoking use included cigarettes. He has a 57.00 pack-year smoking history. he has never used smokeless tobacco. He reports that he does not drink alcohol or use drugs. family history includes Cancer in his sister;  Heart disease in his sister; Lung cancer in his sister; Scleroderma (age of onset: 43) in his mother; Stroke (age of onset: 69) in his father. Allergies  Allergen Reactions  . Duac [Clindamycin Phos-Benzoyl Perox] Rash  . Contrast Media [Iodinated Diagnostic Agents] Rash    Heavy rash and itching 20 yrs ago. Needs full premeds and does well with this.  . Atorvastatin     muscle pain  . Propoxyphene N-Acetaminophen Itching    rash  . Rosuvastatin     muscle weakness  . Metrizamide Rash    radiopaque contrast agent. Heavy rash and itching 20 yrs ago. Needs full premeds and does well with this.  . Other Rash    High acid food--tomatoes, orange juice   Current Outpatient Medications on File Prior to Visit  Medication Sig Dispense Refill  . calcium carbonate (TUMS EX) 750 MG chewable tablet Chew 1 tablet by mouth as needed (for gas and reflux).     . cetirizine (ZYRTEC) 10 MG tablet Take 1 tablet (10 mg total) by mouth daily as needed for allergies. Reported on 10/27/2015 90 tablet 3  . Cholecalciferol (VITAMIN D3) 1000 UNITS CAPS Take 1,000 Units by mouth daily. Reported on 09/16/2015    . clonazePAM (KLONOPIN) 0.5 MG tablet take 1 tablet by mouth twice a day if needed 60 tablet 1  . doxycycline (VIBRA-TABS) 100 MG tablet Take 1 tablet (100 mg total) by mouth 2 (two) times daily. 20 tablet 0  . escitalopram (LEXAPRO) 10 MG tablet Take 10 mg by mouth daily.    Marland Kitchen levothyroxine (SYNTHROID, LEVOTHROID) 25 MCG tablet TAKE 1 TABLET BY MOUTH DAILY BEFORE BREAKFAST. 90 tablet 2  . loteprednol (LOTEMAX) 0.5 % ophthalmic suspension 4 (four) times daily.    . metroNIDAZOLE (METROGEL) 0.75 % gel Apply 1 application topically 2 (two) times daily.    . Multiple Vitamins-Minerals (ICAPS AREDS 2) CAPS Take 1 capsule by mouth 2 (two) times daily.     Marland Kitchen tiotropium (SPIRIVA HANDIHALER) 18 MCG inhalation capsule Place 1 capsule (18 mcg total) into inhaler and inhale daily. 90 capsule 3  . traMADol (ULTRAM) 50  MG tablet Take 1 tablet (50 mg total) by mouth every 12 (twelve) hours as needed. 60 tablet 1   No current facility-administered medications on file prior to visit.    Review of Systems  Constitutional: Negative for other unusual diaphoresis or sweats HENT: Negative for ear discharge or swelling Eyes: Negative for other worsening visual disturbances Respiratory: Negative for stridor or other swelling  Gastrointestinal: Negative for worsening distension or other blood Genitourinary: Negative for retention or other urinary change Musculoskeletal: Negative for other MSK pain or swelling Skin: Negative for color change or other new lesions Neurological: Negative for worsening tremors and other numbness  Psychiatric/Behavioral: Negative for worsening agitation or other fatigue All other system neg per pt    Objective:   Physical Exam BP 114/76   Pulse 64   Temp 97.8 F (36.6 C) (Oral)   Ht 5'  8.5" (1.74 m)   Wt 176 lb (79.8 kg)   SpO2 97%   BMI 26.37 kg/m  VS noted, mild ill appearing Constitutional: Pt appears in NAD HENT: Head: NCAT.  Right Ear: External ear normal.  Left Ear: External ear normal.  Eyes: . Pupils are equal, round, and reactive to light. Conjunctivae and EOM are normal Bilat tm's with mild erythema.  Max sinus areas non tender.  Pharynx with mild erythema, no exudate Nose: without d/c or deformity Neck: Neck supple. Gross normal ROM Cardiovascular: Normal rate and regular rhythm.   Pulmonary/Chest: Effort normal and breath sounds decreased without rales or wheezing.  Abd:  Soft, NT, ND, + BS, no organomegaly Neurological: Pt is alert. At baseline orientation, motor grossly intact Skin: Skin is warm. No rashes, other new lesions, no LE edema Psychiatric: Pt behavior is normal without agitation  No other exam findings     Assessment & Plan:

## 2017-09-14 NOTE — Assessment & Plan Note (Signed)
Lab Results  Component Value Date   HGBA1C 6.0 07/06/2017   stable overall by history and exam, recent data reviewed with pt, and pt to continue medical treatment as before,  to f/u any worsening symptoms or concerns

## 2017-09-14 NOTE — Assessment & Plan Note (Signed)
Also for GI referral

## 2017-09-15 ENCOUNTER — Encounter: Payer: Self-pay | Admitting: Internal Medicine

## 2017-09-15 ENCOUNTER — Ambulatory Visit (INDEPENDENT_AMBULATORY_CARE_PROVIDER_SITE_OTHER): Payer: BLUE CROSS/BLUE SHIELD | Admitting: Internal Medicine

## 2017-09-15 VITALS — BP 96/68 | HR 84 | Ht 68.5 in | Wt 177.2 lb

## 2017-09-15 DIAGNOSIS — K220 Achalasia of cardia: Secondary | ICD-10-CM

## 2017-09-15 DIAGNOSIS — K219 Gastro-esophageal reflux disease without esophagitis: Secondary | ICD-10-CM

## 2017-09-15 DIAGNOSIS — R1310 Dysphagia, unspecified: Secondary | ICD-10-CM

## 2017-09-15 MED ORDER — PANTOPRAZOLE SODIUM 40 MG PO TBEC: 40.0000 mg | DELAYED_RELEASE_TABLET | Freq: Every day | ORAL | 3 refills | Status: DC

## 2017-09-15 NOTE — Patient Instructions (Addendum)
You have been scheduled for an endoscopy. Please follow written instructions given to you at your visit today. If you use inhalers (even only as needed), please bring them with you on the day of your procedure.    We have sent the following medications to your pharmacy for you to pick up at your convenience: Protonix   We are giving you a dysphagia diet, follow step #3    I appreciate the opportunity to care for you. Silvano Rusk, MD, [facility]

## 2017-09-15 NOTE — Progress Notes (Addendum)
JAQUON GINGERICH 77 y.o. [birth year] [id number]  Assessment & Plan:   Encounter Diagnoses  Name Primary?  Marland Kitchen Dysphagia, unspecified type Yes  . Gastroesophageal reflux disease, esophagitis presence not specified   . Hx of esophageal achalasia- s/p heller myomectomy 2014    Patient's episode of dysphagia and vomiting could have been due to a few etiologies. It sounds like he had a food impaction in his esophagus which eventually resolved later in the day. It is possible that he has developed an area of stricture or generalized esophageal narrowing secondary to acid reflux which is frequently a problem after heller myotomy, usually develops several years after.  He will be started on Protonix 40 mg QD and scheduled for an EGD for further evaluation. Pt has been counseled on risks and benefits of procedure and potential complications and he voiced understanding.  I have personally seen the patient, reviewed and repeated key elements of the history and physical and participated in formation of the assessment and plan the student has documented.  Gatha Mayer, MD, [facility] GN:OIBB, Hunt Oris, MD   Subjective:   Chief Complaint: dysphagia  HPI Pt is a 77 yo very nice caucasian male presenting with complains of dysphagia with solids and liquids. He has hx of esophageal achalasia previously tx'd with multiple botox injections and finally a Heller myotomy in 2014. Pt was doing well after the surgery until 09/12/17. He was eating breakfast and was able to eat eggs, bacon, and grits well, but when he tried to eat pancakes, he felt as though it got stuck and he vomited with his food covered with phlegm-like material. He tried eating pancakes again and same thing happened. Later in the afternoon, he had the same response to eating oatmeal cookie and diet coke twice. However he was able to eat cereal later at night and was able to keep it down. He has been able to eat and drink since then without any  problems. He also complains of dyspepsia in the morning and heartburn like sxs for which he takes tums frequently with relief.  Allergies  Allergen Reactions  . Duac [Clindamycin Phos-Benzoyl Perox] Rash  . Contrast Media [Iodinated Diagnostic Agents] Rash    Heavy rash and itching 20 yrs ago. Needs full premeds and does well with this.  . Atorvastatin     muscle pain  . Propoxyphene N-Acetaminophen Itching    rash  . Rosuvastatin     muscle weakness  . Metrizamide Rash    radiopaque contrast agent. Heavy rash and itching 20 yrs ago. Needs full premeds and does well with this.  . Other Rash    High acid food--tomatoes, orange juice   No outpatient medications have been marked as taking for the 09/15/17 encounter (Office Visit) with Gatha Mayer, MD.   Past Medical History:  Diagnosis Date  . Achalasia   . Anxiety    causes shortness  . Arthritis   . COPD (chronic obstructive pulmonary disease) ([place])    with chronic SOB  . Depression   . Dilated aortic root ([place])    51mm by echo 03/2016  . FH: cholecystectomy   . Gallstones   . GERD (gastroesophageal reflux disease)   . Hiatal hernia   . History of alcoholism ([place])   . Hypercholesterolemia   . Hypothyroidism 01/12/2016  . Lesion of right lung 06/04/2013  . MVP (mitral valve prolapse)    with severe MR s/p MV repair  . Osteopenia   .  Pneumonia 06/04/2013   aspirated food 14  . RBBB   . S/P right inguinal herniorrhaphy   . Seasonal allergic rhinitis   . Stroke ([place]) 10/2002  . Transient ischemic attack    Past Surgical History:  Procedure Laterality Date  . BACK SURGERY     x 3  . BOTOX INJECTION  06/27/2012   Procedure: BOTOX INJECTION;  Surgeon: Garlan Fair, MD;  Location: [facility];  Service: Endoscopy;  Laterality: N/A;  . CHOLECYSTECTOMY    . ESOPHAGOGASTRODUODENOSCOPY  01/13/2012   Procedure: ESOPHAGOGASTRODUODENOSCOPY (EGD);  Surgeon: Garlan Fair, MD;  Location: [facility];  Service:  Endoscopy;  Laterality: N/A;  botox /katrina  . ESOPHAGOGASTRODUODENOSCOPY  06/27/2012   Procedure: ESOPHAGOGASTRODUODENOSCOPY (EGD);  Surgeon: Garlan Fair, MD;  Location: [facility];  Service: Endoscopy;  Laterality: N/A;  . ESOPHAGOGASTRODUODENOSCOPY (EGD) WITH ESOPHAGEAL DILATION N/A 11/22/2012   Procedure: ESOPHAGOGASTRODUODENOSCOPY (EGD) WITH BOTOX INJECTION;  Surgeon: Garlan Fair, MD;  Location: [facility];  Service: Endoscopy;  Laterality: N/A;  . Esophagogastroduodenoscopy and Savary esophageal dilation.     x9  . ESOPHAGOSCOPY W/ BOTOX INJECTION     x9 beginning 2008, most recent Sept. 2012  . eyebrow surgery  2012  . FOOT SURGERY Left    screw -fx  . LAPAROSCOPIC HELLER MYOTOMY  July 2014   [facility]  . MITRAL VALVE REPAIR  08/03/2011   Procedure: MINIMALLY INVASIVE MITRAL VALVE REPAIR (MVR);  Surgeon: Rexene Alberts, MD;  Location: [place];  Service: Open Heart Surgery;  Laterality: Right;  minimally invasive MVR  . rt inguinal herniorrhaphy  1969/1997  . TEE WITHOUT CARDIOVERSION  07/06/2011   Procedure: TRANSESOPHAGEAL ECHOCARDIOGRAM (TEE);  Surgeon: Sueanne Margarita, MD;  Location: [place];  Service: Cardiovascular;  Laterality: N/A;  . VIDEO BRONCHOSCOPY WITH ENDOBRONCHIAL NAVIGATION N/A 04/03/2014   Procedure: VIDEO BRONCHOSCOPY WITH ENDOBRONCHIAL NAVIGATION;  Surgeon: Collene Gobble, MD;  Location: [place];  Service: Thoracic;  Laterality: N/A;   Social History   Social History Narrative  . Not on file   family history includes Cancer in his sister; Esophageal cancer in his other; Heart disease in his sister; Lung cancer in his sister; Prostate cancer in his paternal uncle; Scleroderma (age of onset: 52) in his mother; Stroke (age of onset: 39) in his father.   Review of Systems Positive for dysphagia, vomiting, heartburn, dyspepsia. Negative for loss of appetite, weight loss, hematemesis, diarrhea, constipation.  Negative for all other systems.  Objective:    Physical Exam  @BP  96/68   Pulse 84   Ht 5' 8.5" (1.74 m)   Wt 177 lb 4 oz (80.4 kg)   BMI 26.56 kg/m @  General:  Well-developed, well-nourished and in no acute distress Eyes:  anicteric. ENT:   Mouth and posterior pharynx free of lesions. TMJ disorder present- popping of TMJ. Lungs: Clear to auscultation bilaterally. Heart:   S1S2, no rubs, murmurs, gallops. Abdomen:  soft, non-tender, no hepatosplenomegaly, hernia, or mass and BS+.  Lymph:  no cervical or supraclavicular adenopathy. Skin   no rash. Neuro:  A&O x 3.  Psych:  appropriate mood and  Affect.   Data Reviewed: Prior notes, imaging, meds, labs, procedures.

## 2017-10-13 ENCOUNTER — Other Ambulatory Visit: Payer: Self-pay | Admitting: Acute Care

## 2017-11-01 DIAGNOSIS — L309 Dermatitis, unspecified: Secondary | ICD-10-CM | POA: Diagnosis not present

## 2017-11-01 DIAGNOSIS — L821 Other seborrheic keratosis: Secondary | ICD-10-CM | POA: Diagnosis not present

## 2017-11-02 ENCOUNTER — Other Ambulatory Visit: Payer: Self-pay | Admitting: Internal Medicine

## 2017-11-02 NOTE — Telephone Encounter (Signed)
Rx refill request: Klonopin 0.5 mg                            Ultram 50 mg  LOV: 09/14/17   PCP: [place]: verified

## 2017-11-02 NOTE — Telephone Encounter (Signed)
Copied from [place] [id number]. Topic: Quick Communication - Rx Refill/Question >> Nov 02, 2017  1:59 PM Sandi Mariscal E, NT wrote: Medication: traMADol (ULTRAM) 50 MG tablet and  clonazePAM (KLONOPIN) 0.5 MG tablet  Has the patient contacted their pharmacy? Yes    (Agent: If no, request that the patient contact the pharmacy for the refill.)   Preferred Pharmacy (with phone number or street name): [id number], [place] [address] [phone] (Phone) [phone] (Fax)     Agent: Please be advised that RX refills may take up to 3 business days. We ask that you follow-up with your pharmacy.

## 2017-11-02 NOTE — Telephone Encounter (Signed)
Error, sent to wont person

## 2017-11-03 MED ORDER — TRAMADOL HCL 50 MG PO TABS: 50.0000 mg | ORAL_TABLET | Freq: Two times a day (BID) | ORAL | 2 refills | Status: DC | PRN

## 2017-11-03 MED ORDER — CLONAZEPAM 0.5 MG PO TABS: ORAL_TABLET | ORAL | 2 refills | Status: DC

## 2017-11-03 NOTE — Telephone Encounter (Signed)
Done erx 

## 2017-11-10 ENCOUNTER — Encounter: Payer: Self-pay | Admitting: Internal Medicine

## 2017-11-10 DIAGNOSIS — J209 Acute bronchitis, unspecified: Secondary | ICD-10-CM | POA: Diagnosis not present

## 2017-11-10 DIAGNOSIS — J0100 Acute maxillary sinusitis, unspecified: Secondary | ICD-10-CM | POA: Diagnosis not present

## 2017-12-02 DIAGNOSIS — L309 Dermatitis, unspecified: Secondary | ICD-10-CM | POA: Diagnosis not present

## 2017-12-02 DIAGNOSIS — L738 Other specified follicular disorders: Secondary | ICD-10-CM | POA: Diagnosis not present

## 2017-12-02 DIAGNOSIS — L718 Other rosacea: Secondary | ICD-10-CM | POA: Diagnosis not present

## 2018-01-10 ENCOUNTER — Telehealth: Payer: Self-pay | Admitting: Adult Health

## 2018-01-10 MED ORDER — ALBUTEROL SULFATE HFA 108 (90 BASE) MCG/ACT IN AERS: INHALATION_SPRAY | RESPIRATORY_TRACT | 3 refills | Status: DC

## 2018-01-10 NOTE — Telephone Encounter (Signed)
Spoke with pt and advised rx sent to pharmacy. Nothing further is needed.   

## 2018-01-17 ENCOUNTER — Encounter: Payer: Self-pay | Admitting: Adult Health

## 2018-01-17 ENCOUNTER — Ambulatory Visit (INDEPENDENT_AMBULATORY_CARE_PROVIDER_SITE_OTHER): Payer: BLUE CROSS/BLUE SHIELD | Admitting: Adult Health

## 2018-01-17 DIAGNOSIS — K219 Gastro-esophageal reflux disease without esophagitis: Secondary | ICD-10-CM

## 2018-01-17 DIAGNOSIS — J449 Chronic obstructive pulmonary disease, unspecified: Secondary | ICD-10-CM | POA: Diagnosis not present

## 2018-01-17 DIAGNOSIS — J301 Allergic rhinitis due to pollen: Secondary | ICD-10-CM | POA: Diagnosis not present

## 2018-01-17 MED ORDER — AZELASTINE HCL 0.1 % NA SOLN: 2.0000 | Freq: Every day | NASAL | 5 refills | Status: AC

## 2018-01-17 NOTE — Assessment & Plan Note (Signed)
Controlled on GERD diet and Protonix

## 2018-01-17 NOTE — Addendum Note (Signed)
Addended by: Parke Poisson E on: 01/17/2018 03:02 PM   Modules accepted: Orders

## 2018-01-17 NOTE — Assessment & Plan Note (Signed)
Currently controlled on present regimen  Plan  Patient Instructions  Add Saline nasal rinses Twice daily  .  Begin Astelin Nasal 2 puffs At bedtime  .  Continue on Flonase 2 puffs .in am .  Continue on Spiriva daily .  Follow up with Dr. Lamonte Sakai  In 6 months and .As needed

## 2018-01-17 NOTE — Progress Notes (Signed)
@Patient  ID: Chris Lewis, male    DOB: 11-26-1940, 77 y.o.   MRN: [id number]  Chief Complaint  Patient presents with  . Follow-up    Referring provider: Biagio Borg, MD  HPI: 77 year old male former smoker followed for moderate COPD, chronic allergic rhinitis and GERD/esophageal achalasia status post Heller myomectomy in 2014  TEST  PFT 12/20/13: FVC 3.97 L (97%) FEV1 1.90 L (64%) FEV1/FVC 0.48 FEF 25-75 0.58 L (26%) no bronchodilator response TLC 5.37 L (78%) RV 49% ERV 209% DLCO uncorrected 47%  RAST panel: Negative IgE: 5  05/27/10 Alpha-1 Antitrypsin:  MM   01/17/2018 Follow up : COPD, [place]  Patient presents for a one year follow-up.  Patient has moderate COPD and remains on Spiriva daily.  Says overall breathing is doing okay.  Denies any flare of cough or wheezing. Remains active . Works out in yard.   Prevnar and Pneumovax vaccines are up-to-date  Patient has chronic allergic rhinitis.  Is on Zyrtec and Flonase. .  Says nose has been running clear drainage . Using flonase without much help.   Follows with GI for his GERD and previous esophageal achalasia.  Controlled on current regimen with no i dysphagia    Allergies  Allergen Reactions  . Duac [Clindamycin Phos-Benzoyl Perox] Rash  . Contrast Media [Iodinated Diagnostic Agents] Rash    Heavy rash and itching 20 yrs ago. Needs full premeds and does well with this.  . Atorvastatin     muscle pain  . Propoxyphene N-Acetaminophen Itching    rash  . Rosuvastatin     muscle weakness  . Metrizamide Rash    radiopaque contrast agent. Heavy rash and itching 20 yrs ago. Needs full premeds and does well with this.  . Other Rash    High acid food--tomatoes, orange juice    Immunization History  Administered Date(s) Administered  . Influenza Split 05/16/2012  . Influenza Whole 05/16/2009, 04/21/2010, 05/11/2011  . Influenza,inj,Quad PF,6+ Mos 04/25/2013, 04/24/2014, 04/24/2015  . Influenza-Unspecified  04/29/2016, 06/02/2017  . Pneumococcal Conjugate-13 09/16/2015  . Pneumococcal Polysaccharide-23 04/24/2014  . Tdap 07/06/2017    Past Medical History:  Diagnosis Date  . Achalasia   . Anxiety    causes shortness  . Arthritis   . COPD (chronic obstructive pulmonary disease) ([place])    with chronic SOB  . Depression   . Dilated aortic root ([place])    63mm by echo 03/2016  . FH: cholecystectomy   . Gallstones   . GERD (gastroesophageal reflux disease)   . Hiatal hernia   . History of alcoholism ([place])   . Hypercholesterolemia   . Hypothyroidism 01/12/2016  . Lesion of right lung 06/04/2013  . MVP (mitral valve prolapse)    with severe MR s/p MV repair  . Osteopenia   . Pneumonia 06/04/2013   aspirated food 14  . RBBB   . S/P right inguinal herniorrhaphy   . Seasonal allergic rhinitis   . Stroke ([place]) 10/2002  . Transient ischemic attack     Tobacco History: Social History   Tobacco Use  Smoking Status Former Smoker  . Packs/day: 1.50  . Years: 38.00  . Pack years: 57.00  . Types: Cigarettes  . Last attempt to quit: 08/29/1997  . Years since quitting: 20.4  Smokeless Tobacco Never Used   Counseling given: Not Answered   Outpatient Encounter Medications as of 01/17/2018  Medication Sig  . albuterol (VENTOLIN HFA) 108 (90 Base) MCG/ACT inhaler INHALE 2 PUFFS INTO THE  LUNGS EVERY 4 (FOUR) HOURS AS NEEDED FOR WHEEZING.  . calcium carbonate (TUMS EX) 750 MG chewable tablet Chew 1 tablet by mouth as needed (for gas and reflux).   . cetirizine (ZYRTEC) 10 MG tablet Take 1 tablet (10 mg total) by mouth daily as needed for allergies. Reported on 10/27/2015  . Cholecalciferol (VITAMIN D3) 1000 UNITS CAPS Take 1,000 Units by mouth daily. Reported on 09/16/2015  . clonazePAM (KLONOPIN) 0.5 MG tablet take 1 tablet by mouth twice a day if needed  . escitalopram (LEXAPRO) 10 MG tablet Take 10 mg by mouth daily.  . fluticasone (FLONASE) 50 MCG/ACT nasal spray Place 2 sprays into both  nostrils daily.  Marland Kitchen levothyroxine (SYNTHROID, LEVOTHROID) 25 MCG tablet TAKE 1 TABLET BY MOUTH DAILY BEFORE BREAKFAST.  Marland Kitchen loteprednol (LOTEMAX) 0.5 % ophthalmic suspension 4 (four) times daily.  . metroNIDAZOLE (METROGEL) 0.75 % gel Apply 1 application topically 2 (two) times daily.  . Multiple Vitamins-Minerals (ICAPS AREDS 2) CAPS Take 1 capsule by mouth 2 (two) times daily.   . pantoprazole (PROTONIX) 40 MG tablet Take 1 tablet (40 mg total) by mouth daily before breakfast.  . tiotropium (SPIRIVA HANDIHALER) 18 MCG inhalation capsule Place 1 capsule (18 mcg total) into inhaler and inhale daily.  . traMADol (ULTRAM) 50 MG tablet Take 1 tablet (50 mg total) by mouth every 12 (twelve) hours as needed.  . [DISCONTINUED] azithromycin (ZITHROMAX Z-PAK) 250 MG tablet 2 tab by mouth day 1, then 1 per day (Patient not taking: Reported on 01/17/2018)  . [DISCONTINUED] doxycycline (VIBRA-TABS) 100 MG tablet Take 1 tablet (100 mg total) by mouth 2 (two) times daily. (Patient not taking: Reported on 01/17/2018)   No facility-administered encounter medications on file as of 01/17/2018.      Review of Systems  Constitutional:   No  weight loss, night sweats,  Fevers, chills, fatigue, or  lassitude.  HEENT:   No headaches,  Difficulty swallowing,  Tooth/dental problems, or  Sore throat,                No sneezing, itching, ear ache, + nasal congestion, post nasal drip,   CV:  No chest pain,  Orthopnea, PND, swelling in lower extremities, anasarca, dizziness, palpitations, syncope.   GI  No heartburn, indigestion, abdominal pain, nausea, vomiting, diarrhea, change in bowel habits, loss of appetite, bloody stools.   Resp:  No chest wall deformity  Skin: no rash or lesions.  GU: no dysuria, change in color of urine, no urgency or frequency.  No flank pain, no hematuria   MS:  No joint pain or swelling.  No decreased range of motion.  No back pain.    Physical Exam  BP 124/66 (BP Location: Left Arm,  Cuff Size: Normal)   Pulse 64   Ht 5\' 8"  (1.727 m)   Wt 174 lb 12.8 oz (79.3 kg)   SpO2 95%   BMI 26.58 kg/m   GEN: A/Ox3; pleasant , NAD, well nourished    HEENT:  [place]/AT,  EACs-clear, TMs-wnl, NOSE-clear drainage  THROAT-clear, no lesions, no postnasal drip or exudate noted.   NECK:  Supple w/ fair ROM; no JVD; normal carotid impulses w/o bruits; no thyromegaly or nodules palpated; no lymphadenopathy.    RESP  Decreased BS in bases no  accessory muscle use, no dullness to percussion  CARD:  RRR, no m/r/g, no peripheral edema, pulses intact, no cyanosis or clubbing.  GI:   Soft & nt; nml bowel sounds; no organomegaly or  masses detected.   Musco: Warm bil, no deformities or joint swelling noted.   Neuro: alert, no focal deficits noted.    Skin: Warm, no lesions or rashes    Lab Results:  CBC  BNP No results found for: BNP  ProBNP No results found for: PROBNP  Imaging: No results found.   Assessment & Plan:   ALLERGIC RHINITIS, SEASONAL Increased Sx , add Astelin   Plan  Patient Instructions  Add Saline nasal rinses Twice daily  .  Begin Astelin Nasal 2 puffs At bedtime  .  Continue on Flonase 2 puffs .in am .  Continue on Spiriva daily .  Follow up with Dr. Lamonte Sakai  In 6 months and .As needed        COPD (chronic obstructive pulmonary disease) ([facility]) Currently controlled on present regimen  Plan  Patient Instructions  Add Saline nasal rinses Twice daily  .  Begin Astelin Nasal 2 puffs At bedtime  .  Continue on Flonase 2 puffs .in am .  Continue on Spiriva daily .  Follow up with Dr. Lamonte Sakai  In 6 months and .As needed        Esophageal reflux Controlled on GERD diet and Protonix     Seerat Peaden, NP 01/17/2018

## 2018-01-17 NOTE — Patient Instructions (Addendum)
Add Saline nasal rinses Twice daily  .  Begin Astelin Nasal 2 puffs At bedtime  .  Continue on Flonase 2 puffs .in am .  Continue on Spiriva daily .  Follow up with Dr. Lamonte Sakai  In 6 months and .As needed

## 2018-01-17 NOTE — Assessment & Plan Note (Signed)
Increased Sx , add Astelin   Plan  Patient Instructions  Add Saline nasal rinses Twice daily  .  Begin Astelin Nasal 2 puffs At bedtime  .  Continue on Flonase 2 puffs .in am .  Continue on Spiriva daily .  Follow up with Dr. Lamonte Sakai  In 6 months and .As needed

## 2018-02-03 ENCOUNTER — Other Ambulatory Visit: Payer: Self-pay | Admitting: Internal Medicine

## 2018-02-03 DIAGNOSIS — L570 Actinic keratosis: Secondary | ICD-10-CM | POA: Diagnosis not present

## 2018-02-03 DIAGNOSIS — L814 Other melanin hyperpigmentation: Secondary | ICD-10-CM | POA: Diagnosis not present

## 2018-02-03 DIAGNOSIS — D1801 Hemangioma of skin and subcutaneous tissue: Secondary | ICD-10-CM | POA: Diagnosis not present

## 2018-02-03 DIAGNOSIS — D225 Melanocytic nevi of trunk: Secondary | ICD-10-CM | POA: Diagnosis not present

## 2018-02-03 DIAGNOSIS — L821 Other seborrheic keratosis: Secondary | ICD-10-CM | POA: Diagnosis not present

## 2018-02-03 DIAGNOSIS — L718 Other rosacea: Secondary | ICD-10-CM | POA: Diagnosis not present

## 2018-02-03 MED ORDER — TRAMADOL HCL 50 MG PO TABS: 50.0000 mg | ORAL_TABLET | Freq: Two times a day (BID) | ORAL | 5 refills | Status: DC | PRN

## 2018-02-03 MED ORDER — CLONAZEPAM 0.5 MG PO TABS: ORAL_TABLET | ORAL | 5 refills | Status: DC

## 2018-03-07 ENCOUNTER — Other Ambulatory Visit: Payer: Self-pay | Admitting: Internal Medicine

## 2018-03-28 ENCOUNTER — Telehealth: Payer: Self-pay | Admitting: Adult Health

## 2018-03-28 MED ORDER — TIOTROPIUM BROMIDE MONOHYDRATE 18 MCG IN CAPS: 18.0000 ug | ORAL_CAPSULE | Freq: Every day | RESPIRATORY_TRACT | 3 refills | Status: DC

## 2018-03-28 NOTE — Telephone Encounter (Signed)
Called and spoke with pt to verify his preferred pharmacy.  Refill of medication has been sent to pharmacy for pt. Nothing further needed.

## 2018-04-03 ENCOUNTER — Ambulatory Visit (HOSPITAL_COMMUNITY): Payer: Medicare Other

## 2018-04-21 ENCOUNTER — Other Ambulatory Visit (HOSPITAL_COMMUNITY): Payer: Medicare Other

## 2018-04-24 ENCOUNTER — Ambulatory Visit: Payer: Self-pay | Admitting: Cardiology

## 2018-04-28 ENCOUNTER — Other Ambulatory Visit (HOSPITAL_COMMUNITY): Payer: Medicare Other

## 2018-05-03 ENCOUNTER — Other Ambulatory Visit: Payer: Self-pay | Admitting: Internal Medicine

## 2018-05-09 DIAGNOSIS — Z23 Encounter for immunization: Secondary | ICD-10-CM | POA: Diagnosis not present

## 2018-05-09 DIAGNOSIS — J069 Acute upper respiratory infection, unspecified: Secondary | ICD-10-CM | POA: Diagnosis not present

## 2018-05-16 DIAGNOSIS — J209 Acute bronchitis, unspecified: Secondary | ICD-10-CM | POA: Diagnosis not present

## 2018-05-23 ENCOUNTER — Encounter: Payer: Self-pay | Admitting: Cardiology

## 2018-06-29 DIAGNOSIS — H353131 Nonexudative age-related macular degeneration, bilateral, early dry stage: Secondary | ICD-10-CM | POA: Diagnosis not present

## 2018-07-03 ENCOUNTER — Ambulatory Visit: Payer: Self-pay | Admitting: Cardiology

## 2018-08-19 ENCOUNTER — Other Ambulatory Visit: Payer: Self-pay | Admitting: Internal Medicine

## 2018-08-25 MED ORDER — TRAMADOL HCL 50 MG PO TABS: 50.0000 mg | ORAL_TABLET | Freq: Two times a day (BID) | ORAL | 0 refills | Status: DC | PRN

## 2018-08-25 MED ORDER — CLONAZEPAM 0.5 MG PO TABS: ORAL_TABLET | ORAL | 0 refills | Status: DC

## 2018-08-25 NOTE — Telephone Encounter (Signed)
Patient is scheduled to be seen on Tuesday.  He would like to know if he can get a refill on these to get him through?  He was concerned about stopping these medications suddenly. He has been out for about 3 days.

## 2018-08-25 NOTE — Telephone Encounter (Signed)
Done erx 1 mo only, needs ROV for further refills

## 2018-08-25 NOTE — Addendum Note (Signed)
Addended by: Biagio Borg on: 08/25/2018 12:47 PM   Modules accepted: Orders

## 2018-08-29 ENCOUNTER — Encounter: Payer: Self-pay | Admitting: Internal Medicine

## 2018-08-29 ENCOUNTER — Ambulatory Visit (INDEPENDENT_AMBULATORY_CARE_PROVIDER_SITE_OTHER): Payer: BLUE CROSS/BLUE SHIELD | Admitting: Internal Medicine

## 2018-08-29 ENCOUNTER — Other Ambulatory Visit (INDEPENDENT_AMBULATORY_CARE_PROVIDER_SITE_OTHER): Payer: BLUE CROSS/BLUE SHIELD

## 2018-08-29 VITALS — BP 124/82 | HR 57 | Temp 97.6°F | Ht 68.0 in | Wt 174.0 lb

## 2018-08-29 DIAGNOSIS — R739 Hyperglycemia, unspecified: Secondary | ICD-10-CM

## 2018-08-29 DIAGNOSIS — Z0000 Encounter for general adult medical examination without abnormal findings: Secondary | ICD-10-CM

## 2018-08-29 DIAGNOSIS — M25572 Pain in left ankle and joints of left foot: Secondary | ICD-10-CM | POA: Diagnosis not present

## 2018-08-29 LAB — HEPATIC FUNCTION PANEL
ALT: 14 U/L (ref 0–53)
AST: 20 U/L (ref 0–37)
Albumin: 4.3 g/dL (ref 3.5–5.2)
Alkaline Phosphatase: 71 U/L (ref 39–117)
Bilirubin, Direct: 0.1 mg/dL (ref 0.0–0.3)
Total Bilirubin: 0.6 mg/dL (ref 0.2–1.2)
Total Protein: 7.1 g/dL (ref 6.0–8.3)

## 2018-08-29 LAB — URINALYSIS, ROUTINE W REFLEX MICROSCOPIC
Bilirubin Urine: NEGATIVE
Hgb urine dipstick: NEGATIVE
Ketones, ur: NEGATIVE
Leukocytes, UA: NEGATIVE
Nitrite: NEGATIVE
RBC / HPF: NONE SEEN (ref 0–?)
Specific Gravity, Urine: 1.02 (ref 1.000–1.030)
Total Protein, Urine: NEGATIVE
Urine Glucose: NEGATIVE
Urobilinogen, UA: 0.2 (ref 0.0–1.0)
WBC, UA: NONE SEEN (ref 0–?)
pH: 6 (ref 5.0–8.0)

## 2018-08-29 LAB — BASIC METABOLIC PANEL
BUN: 17 mg/dL (ref 6–23)
CALCIUM: 9.4 mg/dL (ref 8.4–10.5)
CO2: 28 mEq/L (ref 19–32)
Chloride: 104 mEq/L (ref 96–112)
Creatinine, Ser: 1.18 mg/dL (ref 0.40–1.50)
GFR: 63.24 mL/min (ref >60.00)
Glucose, Bld: 92 mg/dL (ref 70–99)
Potassium: 4.5 mEq/L (ref 3.5–5.1)
Sodium: 140 mEq/L (ref 135–145)

## 2018-08-29 LAB — LIPID PANEL
Cholesterol: 214 mg/dL — ABNORMAL HIGH (ref 0–200)
HDL: 32.2 mg/dL — ABNORMAL LOW (ref >39.00)
LDL Cholesterol: 153 mg/dL — ABNORMAL HIGH (ref 0–99)
NonHDL: 181.72
Total CHOL/HDL Ratio: 7
Triglycerides: 146 mg/dL (ref 0.0–149.0)
VLDL: 29.2 mg/dL (ref 0.0–40.0)

## 2018-08-29 LAB — CBC WITH DIFFERENTIAL/PLATELET
Basophils Absolute: 0.1 10*3/uL (ref 0.0–0.1)
Basophils Relative: 0.7 % (ref 0.0–3.0)
Eosinophils Absolute: 0.3 10*3/uL (ref 0.0–0.7)
Eosinophils Relative: 3.6 % (ref 0.0–5.0)
HCT: 48.6 % (ref 39.0–52.0)
HEMOGLOBIN: 16.3 g/dL (ref 13.0–17.0)
Lymphocytes Relative: 24.8 % (ref 12.0–46.0)
Lymphs Abs: 1.8 10*3/uL (ref 0.7–4.0)
MCHC: 33.6 g/dL (ref 30.0–36.0)
MCV: 91 fl (ref 78.0–100.0)
Monocytes Absolute: 0.7 10*3/uL (ref 0.1–1.0)
Monocytes Relative: 9.9 % (ref 3.0–12.0)
Neutro Abs: 4.3 10*3/uL (ref 1.4–7.7)
Neutrophils Relative %: 61 % (ref 43.0–77.0)
Platelets: 173 10*3/uL (ref 150.0–400.0)
RBC: 5.34 Mil/uL (ref 4.22–5.81)
RDW: 14 % (ref 11.5–15.5)
WBC: 7.1 10*3/uL (ref 4.0–10.5)

## 2018-08-29 LAB — HEMOGLOBIN A1C: HEMOGLOBIN A1C: 5.9 % (ref 4.6–6.5)

## 2018-08-29 LAB — TSH: TSH: 2.8 u[IU]/mL (ref 0.35–4.50)

## 2018-08-29 MED ORDER — DICLOFENAC SODIUM 1 % TD GEL: 2.0000 g | Freq: Four times a day (QID) | TRANSDERMAL | 5 refills | Status: AC

## 2018-08-29 NOTE — Assessment & Plan Note (Signed)
Probable djd, for volt gel prn,  to f/u any worsening symptoms or concerns

## 2018-08-29 NOTE — Assessment & Plan Note (Signed)
stable overall by history and exam, recent data reviewed with pt, and pt to continue medical treatment as before,  to f/u any worsening symptoms or concerns  

## 2018-08-29 NOTE — Patient Instructions (Signed)
Please take all new medication as prescribed - the topical anti-inflammatory medication for pain  If not improved, please make an appt with Sports Medicine in this office  Please continue all other medications as before, and refills have been done if requested.  Please have the pharmacy call with any other refills you may need.  Please continue your efforts at being more active, low cholesterol diet, and weight control.  You are otherwise up to date with prevention measures today.  Please keep your appointments with your specialists as you may have planned  Please go to the LAB in the Basement (turn left off the elevator) for the tests to be done today  You will be contacted by phone if any changes need to be made immediately.  Otherwise, you will receive a letter about your results with an explanation, but please check with MyChart first.  Please remember to sign up for MyChart if you have not done so, as this will be important to you in the future with finding out test results, communicating by private email, and scheduling acute appointments online when needed.  Please return in 1 year for your yearly visit, or sooner if needed, with Lab testing done 3-5 days before

## 2018-08-29 NOTE — Progress Notes (Signed)
Subjective:    Patient ID: Chris Lewis, male    DOB: 24-Jun-1941, 78 y.o.   MRN: [id number]  HPI  Here for wellness and f/u;  Overall doing ok;  Pt denies Chest pain, worsening SOB, DOE, wheezing, orthopnea, PND, worsening LE edema, palpitations, dizziness or syncope.  Pt denies neurological change such as new headache, facial or extremity weakness.  Pt denies polydipsia, polyuria, or low sugar symptoms. Pt states overall good compliance with treatment and medications, good tolerability, and has been trying to follow appropriate diet.  Pt denies worsening depressive symptoms, suicidal ideation or panic. No fever, night sweats, wt loss, loss of appetite, or other constitutional symptoms.  Pt states good ability with ADL's, has low fall risk, home safety reviewed and adequate, no other significant changes in hearing or vision, and only occasionally active with exercise. Taking lexapro x 3 wks , feeling better now.   Has been statin intolerant in the past.  Denies hyper or hypo thyroid symptoms such as voice, skin or hair change.  Also has ongoing left ankle djd pain, daily, worse to walk, better to sit Past Medical History:  Diagnosis Date  . Achalasia   . Anxiety    causes shortness  . Arthritis   . COPD (chronic obstructive pulmonary disease) ([place])    with chronic SOB  . Depression   . Dilated aortic root ([place])    36mm by echo 03/2016  . FH: cholecystectomy   . Gallstones   . GERD (gastroesophageal reflux disease)   . Hiatal hernia   . History of alcoholism ([place])   . Hypercholesterolemia   . Hypothyroidism 01/12/2016  . Lesion of right lung 06/04/2013  . MVP (mitral valve prolapse)    with severe MR s/p MV repair  . Osteopenia   . Pneumonia 06/04/2013   aspirated food 14  . RBBB   . S/P right inguinal herniorrhaphy   . Seasonal allergic rhinitis   . Stroke ([place]) 10/2002  . Transient ischemic attack    Past Surgical History:  Procedure Laterality Date  . BACK SURGERY     x 3  .  BOTOX INJECTION  06/27/2012   Procedure: BOTOX INJECTION;  Surgeon: Garlan Fair, MD;  [facility]: [facility];  Service: Endoscopy;  Laterality: N/A;  . CHOLECYSTECTOMY    . ESOPHAGOGASTRODUODENOSCOPY  01/13/2012   Procedure: ESOPHAGOGASTRODUODENOSCOPY (EGD);  Surgeon: Garlan Fair, MD;  Location: [facility];  Service: Endoscopy;  Laterality: N/A;  botox /katrina  . ESOPHAGOGASTRODUODENOSCOPY  06/27/2012   Procedure: ESOPHAGOGASTRODUODENOSCOPY (EGD);  Surgeon: Garlan Fair, MD;  Location: [facility];  Service: Endoscopy;  Laterality: N/A;  . ESOPHAGOGASTRODUODENOSCOPY (EGD) WITH ESOPHAGEAL DILATION N/A 11/22/2012   Procedure: ESOPHAGOGASTRODUODENOSCOPY (EGD) WITH BOTOX INJECTION;  Surgeon: Garlan Fair, MD;  [facility];  Service: Endoscopy;  Laterality: N/A;  . Esophagogastroduodenoscopy and Savary esophageal dilation.     x9  . ESOPHAGOSCOPY W/ BOTOX INJECTION     x9 beginning 2008, most recent Sept. 2012  . eyebrow surgery  2012  . FOOT SURGERY Left    screw -fx  . LAPAROSCOPIC HELLER MYOTOMY  July 2014   [facility]  . MITRAL VALVE REPAIR  08/03/2011   Procedure: MINIMALLY INVASIVE MITRAL VALVE REPAIR (MVR);  Surgeon: Rexene Alberts, MD;  Location: [facility];  Service: Open Heart Surgery;  Laterality: Right;  minimally invasive MVR  . rt inguinal herniorrhaphy  1969/1997  . TEE WITHOUT CARDIOVERSION  07/06/2011   Procedure: TRANSESOPHAGEAL ECHOCARDIOGRAM (TEE);  Surgeon: Sueanne Margarita, MD;  Location: [facility];  Service: Cardiovascular;  Laterality: N/A;  . VIDEO BRONCHOSCOPY WITH ENDOBRONCHIAL NAVIGATION N/A 04/03/2014   Procedure: VIDEO BRONCHOSCOPY WITH ENDOBRONCHIAL NAVIGATION;  Surgeon: Collene Gobble, MD;  Location: [facility];  Service: Thoracic;  Laterality: N/A;    reports that he quit smoking about 21 years ago. His smoking use included cigarettes. He has a 57.00 pack-year smoking history. He has never used smokeless tobacco. He reports that he does not drink  alcohol or use drugs. family history includes Cancer in his sister; Esophageal cancer in an other family member; Heart disease in his sister; Lung cancer in his sister; Prostate cancer in his paternal uncle; Scleroderma (age of onset: 48) in his mother; Stroke (age of onset: 58) in his father. Allergies  Allergen Reactions  . Duac [Clindamycin Phos-Benzoyl Perox] Rash  . Contrast Media [Iodinated Diagnostic Agents] Rash    Heavy rash and itching 20 yrs ago. Needs full premeds and does well with this.  . Atorvastatin     muscle pain  . Propoxyphene N-Acetaminophen Itching    rash  . Rosuvastatin     muscle weakness  . Metrizamide Rash    radiopaque contrast agent. Heavy rash and itching 20 yrs ago. Needs full premeds and does well with this.  . Other Rash    High acid food--tomatoes, orange juice   Current Outpatient Medications on File Prior to Visit  Medication Sig Dispense Refill  . albuterol (VENTOLIN HFA) 108 (90 Base) MCG/ACT inhaler INHALE 2 PUFFS INTO THE LUNGS EVERY 4 (FOUR) HOURS AS NEEDED FOR WHEEZING. 1 Inhaler 3  . azelastine (ASTELIN) 0.1 % nasal spray Place 2 sprays into both nostrils at bedtime. Use in each nostril as directed 30 mL 5  . calcium carbonate (TUMS EX) 750 MG chewable tablet Chew 1 tablet by mouth as needed (for gas and reflux).     . cetirizine (ZYRTEC) 10 MG tablet Take 1 tablet (10 mg total) by mouth daily as needed for allergies. Reported on 10/27/2015 90 tablet 3  . Cholecalciferol (VITAMIN D3) 1000 UNITS CAPS Take 1,000 Units by mouth daily. Reported on 09/16/2015    . clonazePAM (KLONOPIN) 0.5 MG tablet take 1 tablet by mouth twice a day if needed 60 tablet 0  . escitalopram (LEXAPRO) 10 MG tablet Take 10 mg by mouth daily.    . fluticasone (FLONASE) 50 MCG/ACT nasal spray Place 2 sprays into both nostrils daily. 16 g 3  . levothyroxine (SYNTHROID, LEVOTHROID) 25 MCG tablet TAKE 1 TABLET BY MOUTH EVERY DAY BEFORE BREAKFAST 30 tablet 5  . loteprednol  (LOTEMAX) 0.5 % ophthalmic suspension 4 (four) times daily.    . metroNIDAZOLE (METROGEL) 0.75 % gel Apply 1 application topically 2 (two) times daily.    . Multiple Vitamins-Minerals (ICAPS AREDS 2) CAPS Take 1 capsule by mouth 2 (two) times daily.     . pantoprazole (PROTONIX) 40 MG tablet Take 1 tablet (40 mg total) by mouth daily before breakfast. 90 tablet 3  . tiotropium (SPIRIVA HANDIHALER) 18 MCG inhalation capsule Place 1 capsule (18 mcg total) into inhaler and inhale daily. 90 capsule 3  . traMADol (ULTRAM) 50 MG tablet Take 1 tablet (50 mg total) by mouth every 12 (twelve) hours as needed. 60 tablet 0   No current facility-administered medications on file prior to visit.    Review of Systems Constitutional: Negative for other unusual diaphoresis, sweats, appetite or weight changes HENT: Negative for other worsening hearing  loss, ear pain, facial swelling, mouth sores or neck stiffness.   Eyes: Negative for other worsening pain, redness or other visual disturbance.  Respiratory: Negative for other stridor or swelling Cardiovascular: Negative for other palpitations or other chest pain  Gastrointestinal: Negative for worsening diarrhea or loose stools, blood in stool, distention or other pain Genitourinary: Negative for hematuria, flank pain or other change in urine volume.  Musculoskeletal: Negative for myalgias or other joint swelling.  Skin: Negative for other color change, or other wound or worsening drainage.  Neurological: Negative for other syncope or numbness. Hematological: Negative for other adenopathy or swelling Psychiatric/Behavioral: Negative for hallucinations, other worsening agitation, SI, self-injury, or new decreased concentration ALl other system neg per pt    Objective:   Physical Exam BP 124/82   Pulse (!) 57   Temp 97.6 F (36.4 C) (Oral)   Ht 5\' 8"  (1.727 m)   Wt 174 lb (78.9 kg)   SpO2 95%   BMI 26.46 kg/m  VS noted,  Constitutional: Pt is oriented  to person, place, and time. Appears well-developed and well-nourished, in no significant distress and comfortable Head: Normocephalic and atraumatic  Eyes: Conjunctivae and EOM are normal. Pupils are equal, round, and reactive to light Right Ear: External ear normal without discharge Left Ear: External ear normal without discharge Nose: Nose without discharge or deformity Mouth/Throat: Oropharynx is without other ulcerations and moist  Neck: Normal range of motion. Neck supple. No JVD present. No tracheal deviation present or significant neck LA or mass Cardiovascular: Normal rate, regular rhythm, normal heart sounds and intact distal pulses.   Pulmonary/Chest: WOB normal and breath sounds without rales or wheezing  Abdominal: Soft. Bowel sounds are normal. NT. No HSM  Musculoskeletal: Normal range of motion. Exhibits no edema Lymphadenopathy: Has no other cervical adenopathy.  Neurological: Pt is alert and oriented to person, place, and time. Pt has normal reflexes. No cranial nerve deficit. Motor grossly intact, Gait intact Skin: Skin is warm and dry. No rash noted or new ulcerations Psychiatric:  Has normal mood and affect. Behavior is normal without agitation No other exam findings Lab Results  Component Value Date   WBC 7.1 08/29/2018   HGB 16.3 08/29/2018   HCT 48.6 08/29/2018   PLT 173.0 08/29/2018   GLUCOSE 92 08/29/2018   CHOL 214 (H) 08/29/2018   TRIG 146.0 08/29/2018   HDL 32.20 (L) 08/29/2018   LDLCALC 153 (H) 08/29/2018   ALT 14 08/29/2018   AST 20 08/29/2018   NA 140 08/29/2018   K 4.5 08/29/2018   CL 104 08/29/2018   CREATININE 1.18 08/29/2018   BUN 17 08/29/2018   CO2 28 08/29/2018   TSH 2.80 08/29/2018   PSA 0.87 07/06/2017   INR 1.06 04/01/2014   HGBA1C 5.9 08/29/2018       Assessment & Plan:

## 2018-08-29 NOTE — Assessment & Plan Note (Signed)

## 2018-08-30 ENCOUNTER — Encounter: Payer: Self-pay | Admitting: Internal Medicine

## 2018-09-18 ENCOUNTER — Ambulatory Visit: Payer: Self-pay | Admitting: Cardiology

## 2018-09-26 ENCOUNTER — Other Ambulatory Visit: Payer: Self-pay | Admitting: Internal Medicine

## 2018-10-09 ENCOUNTER — Telehealth: Payer: Self-pay | Admitting: Internal Medicine

## 2018-10-09 MED ORDER — CLONAZEPAM 0.5 MG PO TABS: ORAL_TABLET | ORAL | 2 refills | Status: DC

## 2018-10-09 MED ORDER — TRAMADOL HCL 50 MG PO TABS: 50.0000 mg | ORAL_TABLET | Freq: Two times a day (BID) | ORAL | 2 refills | Status: DC | PRN

## 2018-10-09 NOTE — Telephone Encounter (Signed)
Done erx 

## 2018-10-17 DIAGNOSIS — H40053 Ocular hypertension, bilateral: Secondary | ICD-10-CM | POA: Diagnosis not present

## 2018-10-17 DIAGNOSIS — H353131 Nonexudative age-related macular degeneration, bilateral, early dry stage: Secondary | ICD-10-CM | POA: Diagnosis not present

## 2018-10-17 DIAGNOSIS — Z961 Presence of intraocular lens: Secondary | ICD-10-CM | POA: Diagnosis not present

## 2018-10-17 DIAGNOSIS — H04123 Dry eye syndrome of bilateral lacrimal glands: Secondary | ICD-10-CM | POA: Diagnosis not present

## 2018-10-20 ENCOUNTER — Other Ambulatory Visit: Payer: Self-pay | Admitting: Internal Medicine

## 2018-11-22 ENCOUNTER — Other Ambulatory Visit: Payer: Self-pay | Admitting: Adult Health

## 2018-11-24 ENCOUNTER — Other Ambulatory Visit: Payer: Self-pay | Admitting: Internal Medicine

## 2018-12-05 ENCOUNTER — Telehealth: Payer: Self-pay | Admitting: Cardiology

## 2018-12-05 NOTE — Telephone Encounter (Signed)
unable to reach/ 12/05/18

## 2018-12-08 ENCOUNTER — Ambulatory Visit: Payer: BLUE CROSS/BLUE SHIELD | Admitting: Cardiology

## 2018-12-26 ENCOUNTER — Other Ambulatory Visit: Payer: Self-pay | Admitting: Adult Health

## 2018-12-28 DIAGNOSIS — H40053 Ocular hypertension, bilateral: Secondary | ICD-10-CM | POA: Diagnosis not present

## 2019-01-25 ENCOUNTER — Other Ambulatory Visit: Payer: Self-pay | Admitting: Adult Health

## 2019-01-26 ENCOUNTER — Telehealth: Payer: Self-pay | Admitting: Internal Medicine

## 2019-01-26 MED ORDER — TRAMADOL HCL 50 MG PO TABS: 50.0000 mg | ORAL_TABLET | Freq: Two times a day (BID) | ORAL | 2 refills | Status: DC | PRN

## 2019-01-26 MED ORDER — CLONAZEPAM 0.5 MG PO TABS: ORAL_TABLET | ORAL | 2 refills | Status: DC

## 2019-01-26 NOTE — Telephone Encounter (Signed)
Done erx 

## 2019-02-06 DIAGNOSIS — L814 Other melanin hyperpigmentation: Secondary | ICD-10-CM | POA: Diagnosis not present

## 2019-02-06 DIAGNOSIS — L570 Actinic keratosis: Secondary | ICD-10-CM | POA: Diagnosis not present

## 2019-02-06 DIAGNOSIS — L738 Other specified follicular disorders: Secondary | ICD-10-CM | POA: Diagnosis not present

## 2019-02-06 DIAGNOSIS — D1801 Hemangioma of skin and subcutaneous tissue: Secondary | ICD-10-CM | POA: Diagnosis not present

## 2019-02-06 DIAGNOSIS — D2239 Melanocytic nevi of other parts of face: Secondary | ICD-10-CM | POA: Diagnosis not present

## 2019-02-06 DIAGNOSIS — L718 Other rosacea: Secondary | ICD-10-CM | POA: Diagnosis not present

## 2019-02-06 DIAGNOSIS — L821 Other seborrheic keratosis: Secondary | ICD-10-CM | POA: Diagnosis not present

## 2019-02-06 DIAGNOSIS — C44622 Squamous cell carcinoma of skin of right upper limb, including shoulder: Secondary | ICD-10-CM | POA: Diagnosis not present

## 2019-02-06 DIAGNOSIS — D22122 Melanocytic nevi of left lower eyelid, including canthus: Secondary | ICD-10-CM | POA: Diagnosis not present

## 2019-02-06 DIAGNOSIS — Z85828 Personal history of other malignant neoplasm of skin: Secondary | ICD-10-CM | POA: Diagnosis not present

## 2019-03-29 ENCOUNTER — Other Ambulatory Visit: Payer: Self-pay

## 2019-03-29 ENCOUNTER — Encounter: Payer: Self-pay | Admitting: Cardiology

## 2019-03-29 ENCOUNTER — Ambulatory Visit (INDEPENDENT_AMBULATORY_CARE_PROVIDER_SITE_OTHER): Payer: BC Managed Care – PPO | Admitting: Cardiology

## 2019-03-29 VITALS — BP 144/86 | HR 57 | Ht 68.0 in | Wt 178.2 lb

## 2019-03-29 DIAGNOSIS — I341 Nonrheumatic mitral (valve) prolapse: Secondary | ICD-10-CM

## 2019-03-29 DIAGNOSIS — I4510 Unspecified right bundle-branch block: Secondary | ICD-10-CM | POA: Diagnosis not present

## 2019-03-29 DIAGNOSIS — I10 Essential (primary) hypertension: Secondary | ICD-10-CM | POA: Diagnosis not present

## 2019-03-29 DIAGNOSIS — I77810 Thoracic aortic ectasia: Secondary | ICD-10-CM | POA: Diagnosis not present

## 2019-03-29 NOTE — Progress Notes (Signed)
Cardiology Office Note:    Date:  03/29/2019   ID:  Chris Lewis, DOB Jul 12, 1941, MRN [id number]  PCP:  Biagio Borg, MD  Cardiologist:  No primary care provider on file.    Referring MD: Biagio Borg, MD   Chief Complaint  Patient presents with  . Follow-up    MPV, MR, HTN    History of Present Illness:    Chris Lewis is a 78 y.o. male  with a hx of MV repair for MVP with moderate to severe MR, normal coronary arteries, normal LVF and HTN. He is here today for followup and is doing well.  He denies any chest pain or pressure, SOB, DOE, PND, orthopnea, LE edema, dizziness, palpitations or syncope. He is compliant with his meds and is tolerating meds with no SE.    Past Medical History:  Diagnosis Date  . Achalasia   . Anxiety    causes shortness  . Arthritis   . COPD (chronic obstructive pulmonary disease) ([place])    with chronic SOB  . Depression   . Dilated aortic root ([place])    78mm by echo 03/2016  . FH: cholecystectomy   . Gallstones   . GERD (gastroesophageal reflux disease)   . Hiatal hernia   . History of alcoholism ([place])   . Hypercholesterolemia   . Hypothyroidism 01/12/2016  . Lesion of right lung 06/04/2013  . MVP (mitral valve prolapse)    with severe MR s/p MV repair  . Osteopenia   . Pneumonia 06/04/2013   aspirated food 14  . RBBB   . S/P right inguinal herniorrhaphy   . Seasonal allergic rhinitis   . Stroke ([place]) 10/2002  . Transient ischemic attack     Past Surgical History:  Procedure Laterality Date  . BACK SURGERY     x 3  . BOTOX INJECTION  06/27/2012   Procedure: BOTOX INJECTION;  Surgeon: Garlan Fair, MD;  [facility];  Service: Endoscopy;  Laterality: N/A;  . CHOLECYSTECTOMY    . ESOPHAGOGASTRODUODENOSCOPY  01/13/2012   Procedure: ESOPHAGOGASTRODUODENOSCOPY (EGD);  Surgeon: Garlan Fair, MD;  Location: [facility];  Service: Endoscopy;  Laterality: N/A;  botox /katrina  . ESOPHAGOGASTRODUODENOSCOPY  06/27/2012   Procedure: ESOPHAGOGASTRODUODENOSCOPY (EGD);  Surgeon: Garlan Fair, MD;  Location: [facility];  Service: Endoscopy;  Laterality: N/A;  . ESOPHAGOGASTRODUODENOSCOPY (EGD) WITH ESOPHAGEAL DILATION N/A 11/22/2012   Procedure: ESOPHAGOGASTRODUODENOSCOPY (EGD) WITH BOTOX INJECTION;  Surgeon: Garlan Fair, MD;  [facility];  Service: Endoscopy;  Laterality: N/A;  . Esophagogastroduodenoscopy and Savary esophageal dilation.     x9  . ESOPHAGOSCOPY W/ BOTOX INJECTION     x9 beginning 2008, most recent Sept. 2012  . eyebrow surgery  2012  . FOOT SURGERY Left    screw -fx  . LAPAROSCOPIC HELLER MYOTOMY  July 2014   [facility]  . MITRAL VALVE REPAIR  08/03/2011   Procedure: MINIMALLY INVASIVE MITRAL VALVE REPAIR (MVR);  Surgeon: Rexene Alberts, MD;  Location: [place];  Service: Open Heart Surgery;  Laterality: Right;  minimally invasive MVR  . rt inguinal herniorrhaphy  1969/1997  . TEE WITHOUT CARDIOVERSION  07/06/2011   Procedure: TRANSESOPHAGEAL ECHOCARDIOGRAM (TEE);  Surgeon: Sueanne Margarita, MD;  Location: [facility];  Service: Cardiovascular;  Laterality: N/A;  . VIDEO BRONCHOSCOPY WITH ENDOBRONCHIAL NAVIGATION N/A 04/03/2014   Procedure: VIDEO BRONCHOSCOPY WITH ENDOBRONCHIAL NAVIGATION;  Surgeon: Collene Gobble, MD;  Location: [facility];  Service: Thoracic;  Laterality: N/A;  Current Medications: Current Meds  Medication Sig  . albuterol (VENTOLIN HFA) 108 (90 Base) MCG/ACT inhaler INHALE 2 PUFFS BY MOUTH EVERY 4 HOURS AS NEEDED FOR WHEEZE  . azelastine (ASTELIN) 0.1 % nasal spray Place 2 sprays into both nostrils at bedtime. Use in each nostril as directed  . calcium carbonate (TUMS EX) 750 MG chewable tablet Chew 1 tablet by mouth as needed (for gas and reflux).   . cetirizine (ZYRTEC) 10 MG tablet Take 1 tablet (10 mg total) by mouth daily as needed for allergies. Reported on 10/27/2015  . Cholecalciferol (VITAMIN D3) 1000 UNITS CAPS Take 1,000 Units by mouth daily. Reported on  09/16/2015  . clonazePAM (KLONOPIN) 0.5 MG tablet take 1 tablet by mouth twice a day if needed  . diclofenac sodium (VOLTAREN) 1 % GEL Apply 2 g topically 4 (four) times daily.  Marland Kitchen escitalopram (LEXAPRO) 10 MG tablet TAKE 1 TABLET BY MOUTH EVERY DAY  . fluticasone (FLONASE) 50 MCG/ACT nasal spray Place 2 sprays into both nostrils daily.  Marland Kitchen levothyroxine (SYNTHROID, LEVOTHROID) 25 MCG tablet TAKE 1 TABLET BY MOUTH EVERY DAY BEFORE BREAKFAST  . metroNIDAZOLE (METROGEL) 0.75 % gel Apply 1 application topically 2 (two) times daily.  Marland Kitchen SPIRIVA HANDIHALER 18 MCG inhalation capsule PLACE 1 CAPSULE INTO INHALER AND INHALE DAILY  . traMADol (ULTRAM) 50 MG tablet Take 1 tablet (50 mg total) by mouth every 12 (twelve) hours as needed.     Allergies:   Duac [clindamycin phos-benzoyl perox], Contrast media [iodinated diagnostic agents], Atorvastatin, Propoxyphene n-acetaminophen, Rosuvastatin, Metrizamide, and Other   Social History   Socioeconomic History  . Marital status: Married    Spouse name: Not on file  . Number of children: 2  . Years of education: Not on file  . Highest education level: Not on file  Occupational History    Employer: RETIRED  Social Needs  . Financial resource strain: Not on file  . Food insecurity    Worry: Not on file    Inability: Not on file  . Transportation needs    Medical: Not on file    Non-medical: Not on file  Tobacco Use  . Smoking status: Former Smoker    Packs/day: 1.50    Years: 38.00    Pack years: 57.00    Types: Cigarettes    Quit date: 08/29/1997    Years since quitting: 21.5  . Smokeless tobacco: Never Used  Substance and Sexual Activity  . Alcohol use: No  . Drug use: No  . Sexual activity: Not on file  Lifestyle  . Physical activity    Days per week: Not on file    Minutes per session: Not on file  . Stress: Not on file  Relationships  . Social Herbalist on phone: Not on file    Gets together: Not on file    Attends  religious service: Not on file    Active member of club or organization: Not on file    Attends meetings of clubs or organizations: Not on file    Relationship status: Not on file  Other Topics Concern  . Not on file  Social History Narrative  . Not on file     Family History: The patient's family history includes Cancer in his sister; Esophageal cancer in an other family member; Heart disease in his sister; Lung cancer in his sister; Prostate cancer in his paternal uncle; Scleroderma (age of onset: 30) in his mother; Stroke (age of onset:  2) in his father. There is no history of Anesthesia problems, Colon cancer, or Liver cancer.  ROS:   Please see the history of present illness.    ROS  All other systems reviewed and negative.   EKGs/Labs/Other Studies Reviewed:    The following studies were reviewed today: none  EKG:  EKG is not  ordered today.    Recent Labs: 08/29/2018: ALT 14; BUN 17; Creatinine, Ser 1.18; Hemoglobin 16.3; Platelets 173.0; Potassium 4.5; Sodium 140; TSH 2.80   Recent Lipid Panel    Component Value Date/Time   CHOL 214 (H) 08/29/2018 1209   TRIG 146.0 08/29/2018 1209   HDL 32.20 (L) 08/29/2018 1209   CHOLHDL 7 08/29/2018 1209   VLDL 29.2 08/29/2018 1209   LDLCALC 153 (H) 08/29/2018 1209    Physical Exam:    VS:  BP (!) 144/86 (BP Location: Left Arm, Patient Position: Sitting, Cuff Size: Normal)   Pulse (!) 57   Ht 5\' 8"  (1.727 m)   Wt 178 lb 3.2 oz (80.8 kg)   BMI 27.10 kg/m     Wt Readings from Last 3 Encounters:  03/29/19 178 lb 3.2 oz (80.8 kg)  08/29/18 174 lb (78.9 kg)  01/17/18 174 lb 12.8 oz (79.3 kg)     GEN:  Well nourished, well developed in no acute distress HEENT: Normal NECK: No JVD; No carotid bruits LYMPHATICS: No lymphadenopathy CARDIAC: RRR, no murmurs, rubs, gallops RESPIRATORY:  Clear to auscultation without rales, wheezing or rhonchi  ABDOMEN: Soft, non-tender, non-distended MUSCULOSKELETAL:  No edema; No deformity   SKIN: Warm and dry NEUROLOGIC:  Alert and oriented x 3 PSYCHIATRIC:  Normal affect   ASSESSMENT:    1. MVP (mitral valve prolapse)   2. HYPERTENSION, BENIGN   3. Dilated aortic root ([place])   4. RBBB    PLAN:    In order of problems listed above:  1. MVP with severe MR s/p MV repair  - he has not had any SOB, LE edema or dizziness.  His last echo  03/2017 showed normal LVF with stable MV repair.   2. HTN - BP is fairly well controlled on exam today.  He has not required an BP meds.   3. Dilated aortic root - echo 2018 with aortic root 35mm and ascending AO 16mm.  Repeat 2D echo to assess for stability.   4. Chronic RBBB   Medication Adjustments/Labs and Tests Ordered: Current medicines are reviewed at length with the patient today.  Concerns regarding medicines are outlined above.  No orders of the defined types were placed in this encounter.  No orders of the defined types were placed in this encounter.   Signed, Fransico Him, MD  03/29/2019 12:00 PM    [place]

## 2019-03-29 NOTE — Patient Instructions (Signed)
Your physician recommends that you continue on your current medications as directed. Please refer to the Current Medication list given to you today.  Your physician has requested that you have an echocardiogram. Echocardiography is a painless test that uses sound waves to create images of your heart. It provides your doctor with information about the size and shape of your heart and how well your heart's chambers and valves are working. This procedure takes approximately one hour. There are no restrictions for this procedure  Your physician wants you to follow-up in:  YEAR WITH DR TURNER You will receive a reminder letter in the mail two months in advance. If you don't receive a letter, please call our office to schedule the follow-up appointment. 

## 2019-04-05 ENCOUNTER — Ambulatory Visit (HOSPITAL_COMMUNITY): Payer: BC Managed Care – PPO | Attending: Cardiovascular Disease

## 2019-04-05 ENCOUNTER — Encounter: Payer: Self-pay | Admitting: Cardiology

## 2019-04-05 ENCOUNTER — Other Ambulatory Visit: Payer: Self-pay

## 2019-04-05 DIAGNOSIS — I77810 Thoracic aortic ectasia: Secondary | ICD-10-CM | POA: Insufficient documentation

## 2019-04-06 ENCOUNTER — Telehealth: Payer: Self-pay | Admitting: *Deleted

## 2019-04-06 DIAGNOSIS — I77810 Thoracic aortic ectasia: Secondary | ICD-10-CM

## 2019-04-06 NOTE — Telephone Encounter (Signed)
Pt has been notified of echo results by phone with verbal understanding. Pt is agreeable to plan of care to repeat Limited Echo in 1 yr. Order and recall placed today. Pt thanked me for the call.  Patient notified of result.  Please refer to phone note from today for complete details.   Julaine Hua, CMA 04/06/2019 8:59 AM

## 2019-05-08 ENCOUNTER — Telehealth: Payer: Self-pay | Admitting: Emergency Medicine

## 2019-05-08 MED ORDER — SPIRIVA HANDIHALER 18 MCG IN CAPS: ORAL_CAPSULE | RESPIRATORY_TRACT | 0 refills | Status: DC

## 2019-05-08 NOTE — Telephone Encounter (Signed)
Received faxed refill request from [facility] on [address]  Medication name/strength/dose: Spiriva HandiHaler Medication last rx'd: 6.12.2020 Quantity and number of refills last rx'd: #90 with 0 refills  Patient last seen in the office on 6.19.19 by TP  Does refill need to be authorized by a provider? No Refill authorized (yes or no)?: Yes.  Called patient to schedule follow up - patient is out of town in [place] (currently at the grocery store and unable to look at his calendar to schedule appt).  Advised patient will call him back later this afternoon but if we miss each other to please call the office.  Patient voiced his understanding.  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  Refill sent under TP as she saw patient last.

## 2019-05-16 ENCOUNTER — Other Ambulatory Visit: Payer: Self-pay | Admitting: Adult Health

## 2019-05-21 ENCOUNTER — Telehealth: Payer: Self-pay | Admitting: Adult Health

## 2019-05-21 NOTE — Telephone Encounter (Signed)
Spoke with pt. He is needing a refill on Ventolin. Advised him that he would need an office visit in order to refill his medication. Pt has been scheduled for a televisit with Tammy on 05/22/2019 at 1000. Pt is not active on MyChart and declined coming to the office. Nothing further was needed.

## 2019-05-22 ENCOUNTER — Ambulatory Visit (INDEPENDENT_AMBULATORY_CARE_PROVIDER_SITE_OTHER): Payer: BC Managed Care – PPO | Admitting: Adult Health

## 2019-05-22 ENCOUNTER — Other Ambulatory Visit: Payer: Self-pay

## 2019-05-22 ENCOUNTER — Encounter: Payer: Self-pay | Admitting: Adult Health

## 2019-05-22 DIAGNOSIS — J301 Allergic rhinitis due to pollen: Secondary | ICD-10-CM | POA: Diagnosis not present

## 2019-05-22 DIAGNOSIS — K219 Gastro-esophageal reflux disease without esophagitis: Secondary | ICD-10-CM | POA: Diagnosis not present

## 2019-05-22 DIAGNOSIS — J449 Chronic obstructive pulmonary disease, unspecified: Secondary | ICD-10-CM | POA: Diagnosis not present

## 2019-05-22 MED ORDER — ALBUTEROL SULFATE HFA 108 (90 BASE) MCG/ACT IN AERS: INHALATION_SPRAY | RESPIRATORY_TRACT | 5 refills | Status: AC

## 2019-05-22 NOTE — Patient Instructions (Addendum)
Continue on Flonase 2 puffs .in am .  Continue on Zyrtec 10mg  daily .  Continue on Spiriva daily .  Activity as tolerated.  Follow up with Dr. Lamonte Sakai  Or Oddie Kuhlmann NP in 1 year and  and .As needed

## 2019-05-22 NOTE — Progress Notes (Signed)
Virtual Visit via Telephone Note  I connected with Chris Lewis on 05/22/19 at 10:00 AM EDT by telephone and verified that I am speaking with the correct person using two identifiers.  Location: Patient: Home  Provider: Office    I discussed the limitations, risks, security and privacy concerns of performing an evaluation and management service by telephone and the availability of in person appointments. I also discussed with the patient that there may be a patient responsible charge related to this service. The patient expressed understanding and agreed to proceed.   History of Present Illness: 78 year old male former smoker followed for moderate COPD, chronic allergic rhinitis and GERD with esophageal achalasia status post Heller myomectomy in 2014  Today's tele-visit is a one-year follow-up.  Patient has moderate COPD.  He is on Spiriva daily.  Says overall breathing is doing okay.  He says he tries to remain active.  Works out in the yard.  Shoots guns. Denies any flare cough or wheezing. Rides stationary bike each day.  Flu shot is up-to-date Prevnar and Pneumovax vaccines are up-to-date Has rare use of his Ventolin.  Needs refills today. Chest xray 2018 showed chronic changes .  Flu shot , Pneumovax and Prevnar up to date.   Patient has chronic allergic rhinitis is on Flonase and Zyrtec. Says doing well . No flare .   He is followed for GERD and esophageal achalasia by GI. Says under control.  On Protonix daily.      Observations/Objective: PFT 12/20/13: FVC 3.97 L (97%) FEV1 1.90 L (64%) FEV1/FVC 0.48 FEF 25-75 0.58 L (26%) no bronchodilator response TLC 5.37 L (78%) RV 49% ERV 209% DLCO uncorrected 47%  RAST panel: Negative IgE: 5  05/27/10 Alpha-1 Antitrypsin: MM  Assessment and Plan: COPD-stable   Chronic allergic rhinitis-controlled   GERD- controlled on PPI   Plan  Patient Instructions   Continue on Flonase 2 puffs .in am .  Continue on Zyrtec 10mg  daily .   Continue on Spiriva daily .  Activity as tolerated.  Follow up with Dr. Lamonte Sakai  Or Spyridon Hornstein NP in 1 year and  and .As needed          Follow Up Instructions: Follow up in 1 year and As needed       I discussed the assessment and treatment plan with the patient. The patient was provided an opportunity to ask questions and all were answered. The patient agreed with the plan and demonstrated an understanding of the instructions.   The patient was advised to call back or seek an in-person evaluation if the symptoms worsen or if the condition fails to improve as anticipated.  I provided 22 minutes of non-face-to-face time during this encounter.   Rexene Edison, NP

## 2019-05-22 NOTE — Addendum Note (Signed)
Addended by: Len Blalock on: 05/22/2019 11:29 AM   Modules accepted: Orders

## 2019-05-25 ENCOUNTER — Other Ambulatory Visit: Payer: Self-pay | Admitting: Internal Medicine

## 2019-05-25 NOTE — Telephone Encounter (Signed)
Medication Refill - Medication: traMADol (ULTRAM) 50 MG tablet clonazePAM (KLONOPIN) 0.5 MG tablet  Has the patient contacted their pharmacy? Yes - states they need new refill from PCP (Agent: If no, request that the patient contact the pharmacy for the refill.) (Agent: If yes, when and what did the pharmacy advise?)  Preferred Pharmacy (with phone number or street name):  [facility] RE:[id number] [address], [place] [phone] (Phone) [phone] (Fax)   Agent: Please be advised that RX refills may take up to 3 business days. We ask that you follow-up with your pharmacy.

## 2019-05-25 NOTE — Telephone Encounter (Signed)
Requested medication (s) are due for refill today: yes  Requested medication (s) are on the active medication list: yes  Last refill:  01/2019  Future visit scheduled: yes  Notes to clinic:  Refill cannot be delegated    Requested Prescriptions  Pending Prescriptions Disp Refills   traMADol (ULTRAM) 50 MG tablet 60 tablet 2    Sig: Take 1 tablet (50 mg total) by mouth every 12 (twelve) hours as needed.     Not Delegated - Analgesics:  Opioid Agonists Failed - 05/25/2019  1:34 PM      Failed - This refill cannot be delegated      Failed - Urine Drug Screen completed in last 360 days.      Failed - Valid encounter within last 6 months    Recent Outpatient Visits          8 months ago Preventative health care   [facility] -Georges Mouse, MD   1 year ago Cough   [place] John, Phi W, MD   1 year ago Encounter for well adult exam with abnormal findings   [place] John, Adriann W, MD   2 years ago COPD exacerbation [facility])   [place] John, Garlon W, MD   2 years ago Cough   [place], Kylian W, MD      Future Appointments            In 3 months Biagio Borg, MD [facility]            clonazePAM (KLONOPIN) 0.5 MG tablet 60 tablet 2    Sig: take 1 tablet by mouth twice a day if needed     Not Delegated - Psychiatry:  Anxiolytics/Hypnotics Failed - 05/25/2019  1:34 PM      Failed - This refill cannot be delegated      Failed - Urine Drug Screen completed in last 360 days.      Failed - Valid encounter within last 6 months    Recent Outpatient Visits          8 months ago Preventative health care   [facility] -Georges Mouse, MD   1 year ago Cough   [place] [facility] -Georges Mouse, MD   1 year ago Encounter for well adult exam with abnormal findings   [place] John, Abdurahman W, MD   2 years ago COPD exacerbation [facility])   [place] [facility] -Georges Mouse, MD   2 years ago Cough   [place] [facility] -Georges Mouse, MD      Future Appointments            In 3 months Jenny Reichmann, Hunt Oris, MD [place], [facility]

## 2019-05-28 MED ORDER — CLONAZEPAM 0.5 MG PO TABS: ORAL_TABLET | ORAL | 2 refills | Status: AC

## 2019-05-28 MED ORDER — TRAMADOL HCL 50 MG PO TABS: 50.0000 mg | ORAL_TABLET | Freq: Two times a day (BID) | ORAL | 2 refills | Status: AC | PRN

## 2019-05-28 NOTE — Telephone Encounter (Signed)
Both done erx 

## 2019-05-28 NOTE — Telephone Encounter (Signed)
PT is calling about these refills says he has COPD if he can at least just do a phone appt. He has a schedule appt for his physcial the first of the year. He has stayed well and does not want to risk anything with covid

## 2019-07-02 DIAGNOSIS — H40053 Ocular hypertension, bilateral: Secondary | ICD-10-CM | POA: Diagnosis not present

## 2019-08-03 ENCOUNTER — Other Ambulatory Visit: Payer: Self-pay | Admitting: Adult Health

## 2019-08-03 MED ORDER — SPIRIVA HANDIHALER 18 MCG IN CAPS: ORAL_CAPSULE | RESPIRATORY_TRACT | 1 refills | Status: AC

## 2019-08-03 NOTE — Telephone Encounter (Signed)
Received faxed refill request from [place]  Medication name/strength/dose: Spiriva Handihaler Medication last rx'd: 05/13/2019 Quantity and number of refills last rx'd: #90 w/ 0  Patient last seen in the office on 10.6.2020  Does refill need to be authorized by a provider? no Refill authorized (yes or no)?: yes

## 2019-08-27 ENCOUNTER — Other Ambulatory Visit: Payer: Self-pay

## 2019-08-27 ENCOUNTER — Encounter (HOSPITAL_COMMUNITY): Payer: Self-pay | Admitting: Emergency Medicine

## 2019-08-27 ENCOUNTER — Emergency Department (HOSPITAL_COMMUNITY): Payer: BC Managed Care – PPO

## 2019-08-27 ENCOUNTER — Inpatient Hospital Stay (HOSPITAL_COMMUNITY)
Admission: EM | Admit: 2019-08-27 | Discharge: 2019-09-10 | DRG: 177 | Disposition: A | Discharge status: Home Health | Payer: BC Managed Care – PPO | Attending: Internal Medicine | Admitting: Internal Medicine

## 2019-08-27 DIAGNOSIS — Z881 Allergy status to other antibiotic agents status: Secondary | ICD-10-CM

## 2019-08-27 DIAGNOSIS — J439 Emphysema, unspecified: Secondary | ICD-10-CM | POA: Diagnosis present

## 2019-08-27 DIAGNOSIS — Z8042 Family history of malignant neoplasm of prostate: Secondary | ICD-10-CM | POA: Diagnosis not present

## 2019-08-27 DIAGNOSIS — N179 Acute kidney failure, unspecified: Secondary | ICD-10-CM | POA: Diagnosis not present

## 2019-08-27 DIAGNOSIS — Z8249 Family history of ischemic heart disease and other diseases of the circulatory system: Secondary | ICD-10-CM | POA: Diagnosis not present

## 2019-08-27 DIAGNOSIS — I959 Hypotension, unspecified: Secondary | ICD-10-CM | POA: Diagnosis not present

## 2019-08-27 DIAGNOSIS — R0689 Other abnormalities of breathing: Secondary | ICD-10-CM | POA: Diagnosis not present

## 2019-08-27 DIAGNOSIS — Z823 Family history of stroke: Secondary | ICD-10-CM | POA: Diagnosis not present

## 2019-08-27 DIAGNOSIS — Z952 Presence of prosthetic heart valve: Secondary | ICD-10-CM

## 2019-08-27 DIAGNOSIS — Z91041 Radiographic dye allergy status: Secondary | ICD-10-CM

## 2019-08-27 DIAGNOSIS — E7800 Pure hypercholesterolemia, unspecified: Secondary | ICD-10-CM | POA: Diagnosis present

## 2019-08-27 DIAGNOSIS — I129 Hypertensive chronic kidney disease with stage 1 through stage 4 chronic kidney disease, or unspecified chronic kidney disease: Secondary | ICD-10-CM | POA: Diagnosis not present

## 2019-08-27 DIAGNOSIS — J449 Chronic obstructive pulmonary disease, unspecified: Secondary | ICD-10-CM | POA: Diagnosis not present

## 2019-08-27 DIAGNOSIS — K449 Diaphragmatic hernia without obstruction or gangrene: Secondary | ICD-10-CM | POA: Diagnosis not present

## 2019-08-27 DIAGNOSIS — Z8673 Personal history of transient ischemic attack (TIA), and cerebral infarction without residual deficits: Secondary | ICD-10-CM | POA: Diagnosis not present

## 2019-08-27 DIAGNOSIS — Z888 Allergy status to other drugs, medicaments and biological substances status: Secondary | ICD-10-CM

## 2019-08-27 DIAGNOSIS — E2740 Unspecified adrenocortical insufficiency: Secondary | ICD-10-CM | POA: Diagnosis present

## 2019-08-27 DIAGNOSIS — J1282 Pneumonia due to coronavirus disease 2019: Secondary | ICD-10-CM | POA: Diagnosis not present

## 2019-08-27 DIAGNOSIS — Z801 Family history of malignant neoplasm of trachea, bronchus and lung: Secondary | ICD-10-CM

## 2019-08-27 DIAGNOSIS — Z66 Do not resuscitate: Secondary | ICD-10-CM | POA: Diagnosis present

## 2019-08-27 DIAGNOSIS — N1831 Chronic kidney disease, stage 3a: Secondary | ICD-10-CM | POA: Diagnosis not present

## 2019-08-27 DIAGNOSIS — I499 Cardiac arrhythmia, unspecified: Secondary | ICD-10-CM | POA: Diagnosis not present

## 2019-08-27 DIAGNOSIS — M1990 Unspecified osteoarthritis, unspecified site: Secondary | ICD-10-CM | POA: Diagnosis not present

## 2019-08-27 DIAGNOSIS — Z87891 Personal history of nicotine dependence: Secondary | ICD-10-CM | POA: Diagnosis not present

## 2019-08-27 DIAGNOSIS — J9811 Atelectasis: Secondary | ICD-10-CM | POA: Diagnosis not present

## 2019-08-27 DIAGNOSIS — J9601 Acute respiratory failure with hypoxia: Secondary | ICD-10-CM | POA: Diagnosis not present

## 2019-08-27 DIAGNOSIS — I77810 Thoracic aortic ectasia: Secondary | ICD-10-CM | POA: Diagnosis present

## 2019-08-27 DIAGNOSIS — K219 Gastro-esophageal reflux disease without esophagitis: Secondary | ICD-10-CM | POA: Diagnosis present

## 2019-08-27 DIAGNOSIS — I341 Nonrheumatic mitral (valve) prolapse: Secondary | ICD-10-CM | POA: Diagnosis present

## 2019-08-27 DIAGNOSIS — R0902 Hypoxemia: Secondary | ICD-10-CM | POA: Diagnosis not present

## 2019-08-27 DIAGNOSIS — T380X5A Adverse effect of glucocorticoids and synthetic analogues, initial encounter: Secondary | ICD-10-CM | POA: Diagnosis not present

## 2019-08-27 DIAGNOSIS — E785 Hyperlipidemia, unspecified: Secondary | ICD-10-CM | POA: Diagnosis present

## 2019-08-27 DIAGNOSIS — Z79899 Other long term (current) drug therapy: Secondary | ICD-10-CM

## 2019-08-27 DIAGNOSIS — E039 Hypothyroidism, unspecified: Secondary | ICD-10-CM | POA: Diagnosis not present

## 2019-08-27 DIAGNOSIS — R0602 Shortness of breath: Secondary | ICD-10-CM | POA: Diagnosis not present

## 2019-08-27 DIAGNOSIS — Z800 Family history of malignant neoplasm of digestive organs: Secondary | ICD-10-CM

## 2019-08-27 DIAGNOSIS — I4510 Unspecified right bundle-branch block: Secondary | ICD-10-CM | POA: Diagnosis not present

## 2019-08-27 DIAGNOSIS — Z79890 Hormone replacement therapy (postmenopausal): Secondary | ICD-10-CM

## 2019-08-27 DIAGNOSIS — M8580 Other specified disorders of bone density and structure, unspecified site: Secondary | ICD-10-CM | POA: Diagnosis present

## 2019-08-27 DIAGNOSIS — E875 Hyperkalemia: Secondary | ICD-10-CM | POA: Diagnosis present

## 2019-08-27 DIAGNOSIS — U071 COVID-19: Secondary | ICD-10-CM | POA: Diagnosis not present

## 2019-08-27 DIAGNOSIS — J9600 Acute respiratory failure, unspecified whether with hypoxia or hypercapnia: Secondary | ICD-10-CM | POA: Diagnosis present

## 2019-08-27 LAB — COMPREHENSIVE METABOLIC PANEL
ALT: 36 U/L (ref 0–44)
AST: 74 U/L — ABNORMAL HIGH (ref 15–41)
Albumin: 3.7 g/dL (ref 3.5–5.0)
Alkaline Phosphatase: 64 U/L (ref 38–126)
Anion gap: 13 (ref 5–15)
BUN: 45 mg/dL — ABNORMAL HIGH (ref 8–23)
CO2: 21 mmol/L — ABNORMAL LOW (ref 22–32)
Calcium: 9.2 mg/dL (ref 8.9–10.3)
Chloride: 103 mmol/L (ref 98–111)
Creatinine, Ser: 1.78 mg/dL — ABNORMAL HIGH (ref 0.61–1.24)
GFR calc Af Amer: 41 mL/min — ABNORMAL LOW (ref >60)
GFR calc non Af Amer: 35 mL/min — ABNORMAL LOW (ref >60)
Glucose, Bld: 134 mg/dL — ABNORMAL HIGH (ref 70–99)
Potassium: 5.6 mmol/L — ABNORMAL HIGH (ref 3.5–5.1)
Sodium: 137 mmol/L (ref 135–145)
Total Bilirubin: 2.1 mg/dL — ABNORMAL HIGH (ref 0.3–1.2)
Total Protein: 7.7 g/dL (ref 6.5–8.1)

## 2019-08-27 LAB — FERRITIN: Ferritin: 1041 ng/mL — ABNORMAL HIGH (ref 24–336)

## 2019-08-27 LAB — CBC
HCT: 52.6 % — ABNORMAL HIGH (ref 39.0–52.0)
Hemoglobin: 17.4 g/dL — ABNORMAL HIGH (ref 13.0–17.0)
MCH: 29.9 pg (ref 26.0–34.0)
MCHC: 33.1 g/dL (ref 30.0–36.0)
MCV: 90.5 fL (ref 80.0–100.0)
Platelets: 260 10*3/uL (ref 150–400)
RBC: 5.81 MIL/uL (ref 4.22–5.81)
RDW: 13 % (ref 11.5–15.5)
WBC: 9.1 10*3/uL (ref 4.0–10.5)
nRBC: 0 % (ref 0.0–0.2)

## 2019-08-27 LAB — FIBRINOGEN: Fibrinogen: 625 mg/dL — ABNORMAL HIGH (ref 210–475)

## 2019-08-27 LAB — TRIGLYCERIDES: Triglycerides: 139 mg/dL (ref <150)

## 2019-08-27 LAB — TROPONIN I (HIGH SENSITIVITY)
Troponin I (High Sensitivity): 18 ng/L — ABNORMAL HIGH (ref <18)
Troponin I (High Sensitivity): 17 ng/L (ref <18)

## 2019-08-27 LAB — BASIC METABOLIC PANEL
Anion gap: 9 (ref 5–15)
BUN: 43 mg/dL — ABNORMAL HIGH (ref 8–23)
CO2: 25 mmol/L (ref 22–32)
Calcium: 9.2 mg/dL (ref 8.9–10.3)
Chloride: 104 mmol/L (ref 98–111)
Creatinine, Ser: 1.75 mg/dL — ABNORMAL HIGH (ref 0.61–1.24)
GFR calc Af Amer: 42 mL/min — ABNORMAL LOW (ref >60)
GFR calc non Af Amer: 36 mL/min — ABNORMAL LOW (ref >60)
Glucose, Bld: 144 mg/dL — ABNORMAL HIGH (ref 70–99)
Potassium: 4.3 mmol/L (ref 3.5–5.1)
Sodium: 138 mmol/L (ref 135–145)

## 2019-08-27 LAB — PROTIME-INR
INR: 1 (ref 0.8–1.2)
Prothrombin Time: 13 seconds (ref 11.4–15.2)

## 2019-08-27 LAB — APTT: aPTT: 26 seconds (ref 24–36)

## 2019-08-27 LAB — POC SARS CORONAVIRUS 2 AG -  ED
SARS Coronavirus 2 Ag: POSITIVE — AB
SARS Coronavirus 2 Ag: POSITIVE — AB

## 2019-08-27 LAB — LACTATE DEHYDROGENASE: LDH: 673 U/L — ABNORMAL HIGH (ref 98–192)

## 2019-08-27 LAB — D-DIMER, QUANTITATIVE: D-Dimer, Quant: 3.67 ug/mL-FEU — ABNORMAL HIGH (ref 0.00–0.50)

## 2019-08-27 LAB — PROCALCITONIN: Procalcitonin: 0.13 ng/mL

## 2019-08-27 LAB — LACTIC ACID, PLASMA: Lactic Acid, Venous: 3 mmol/L (ref 0.5–1.9)

## 2019-08-27 LAB — C-REACTIVE PROTEIN: CRP: 8.8 mg/dL — ABNORMAL HIGH (ref <1.0)

## 2019-08-27 MED ORDER — SODIUM CHLORIDE 0.9 % IV SOLN: Freq: Once | INTRAVENOUS | Status: AC

## 2019-08-27 MED ORDER — SODIUM CHLORIDE 0.9 % IV SOLN
200.0000 mg | Freq: Once | INTRAVENOUS | Status: AC
  Administered 2019-08-27: 200 mg via INTRAVENOUS
  Filled 2019-08-27: qty 200

## 2019-08-27 MED ORDER — HEPARIN BOLUS VIA INFUSION: 80.0000 [IU]/kg | Freq: Once | INTRAVENOUS | Status: DC

## 2019-08-27 MED ORDER — DEXAMETHASONE SODIUM PHOSPHATE 10 MG/ML IJ SOLN
10.0000 mg | Freq: Once | INTRAMUSCULAR | Status: AC
  Administered 2019-08-27: 22:00:00 10 mg via INTRAVENOUS
  Filled 2019-08-27: qty 1

## 2019-08-27 MED ORDER — LACTATED RINGERS IV SOLN: INTRAVENOUS | Status: DC

## 2019-08-27 MED ORDER — DIPHENHYDRAMINE HCL 25 MG PO CAPS: 50.0000 mg | ORAL_CAPSULE | Freq: Once | ORAL | Status: AC

## 2019-08-27 MED ORDER — HEPARIN (PORCINE) 25000 UT/250ML-% IV SOLN: 18.0000 [IU]/kg/h | INTRAVENOUS | Status: DC

## 2019-08-27 MED ORDER — HEPARIN (PORCINE) 25000 UT/250ML-% IV SOLN
1400.0000 [IU]/h | INTRAVENOUS | Status: DC
  Administered 2019-08-27: 1400 [IU]/h via INTRAVENOUS
  Filled 2019-08-27: qty 250

## 2019-08-27 MED ORDER — HEPARIN BOLUS VIA INFUSION
5600.0000 [IU] | Freq: Once | INTRAVENOUS | Status: AC
  Administered 2019-08-27: 5600 [IU] via INTRAVENOUS
  Filled 2019-08-27: qty 5600

## 2019-08-27 MED ORDER — SODIUM CHLORIDE 0.9 % IV SOLN
100.0000 mg | Freq: Every day | INTRAVENOUS | Status: AC
  Administered 2019-08-28 – 2019-08-31 (×4): 100 mg via INTRAVENOUS
  Filled 2019-08-27 (×5): qty 20

## 2019-08-27 MED ORDER — DIPHENHYDRAMINE HCL 50 MG/ML IJ SOLN
50.0000 mg | Freq: Once | INTRAMUSCULAR | Status: AC
  Administered 2019-08-28: 50 mg via INTRAVENOUS
  Filled 2019-08-27: qty 1

## 2019-08-27 NOTE — ED Triage Notes (Addendum)
Pt reports being exposed to [place] December 30th, has been at home since, reports worsening SOB x 3 days (has COPD with mild SOB at baseline) and fatigue. Cp under bilateral ribs. Has rash on chest. Placed on 4L [place] for sats of 79% on RA. No home O2.

## 2019-08-27 NOTE — ED Provider Notes (Signed)
[facility] EMERGENCY DEPARTMENT Provider Note   CSN: [id number] Arrival date & time: 08/27/19  1805     History No chief complaint on file.   Chris Lewis is a 79 y.o. male.  HPI 79 year old male with history of COPD, hypertension, hyperlipidemia, presenting to the emergency department for a week history of fatigue, malaise as well as a worsening 3-day history of shortness of breath and pleuritic chest pain.  Denies any fevers but does report some chills, malaise and fatigue.  Recently was exposed to someone with COVID-19 approximately 3 weeks ago, states that he is progressively felt worse over the past 7 days.  No nausea or vomiting but does report some decreased p.o. intake, no headaches, denies any hemoptysis or hematemesis, no diarrhea, no melena.  Does report worsening fatigue, and trouble breathing. 79% on RA per EMS and triage, placed on O2 [place] in lobby in the ED.     Past Medical History:  Diagnosis Date  . Achalasia   . Anxiety    causes shortness  . Arthritis   . COPD (chronic obstructive pulmonary disease) ([place])    with chronic SOB  . Depression   . Dilated aortic root ([place])    62mm by echo 03/2019  . FH: cholecystectomy   . Gallstones   . GERD (gastroesophageal reflux disease)   . Hiatal hernia   . History of alcoholism ([place])   . Hypercholesterolemia   . Hypothyroidism 01/12/2016  . Lesion of right lung 06/04/2013  . MVP (mitral valve prolapse)    with severe MR s/p MV repair  . Osteopenia   . Pneumonia 06/04/2013   aspirated food 14  . RBBB   . S/P right inguinal herniorrhaphy   . Seasonal allergic rhinitis   . Stroke ([place]) 10/2002  . Transient ischemic attack     Patient Active Problem List   Diagnosis Date Noted  . Left ankle pain 08/29/2018  . Vomiting without nausea 09/14/2017  . Dysphagia 09/14/2017  . Insomnia 07/07/2017  . Acute sinus infection 07/06/2017  . RBBB   . COPD (chronic obstructive pulmonary disease) ([place])  01/06/2017  . Dilated aortic root ([place])   . Hypothyroidism 01/12/2016  . Tick bite, infected 01/06/2016  . Preventative health care 12/18/2015  . Hyperglycemia 12/18/2015  . Memory dysfunction 12/18/2015  . Rib pain 12/01/2015  . Cough 10/27/2015  . Nodule of right lung 09/10/2013  . H/O respiratory system disease 03/01/2013  . Temporary cerebral vascular dysfunction 03/01/2013  . S/P MVR (mitral valve repair) 08/23/2011  . Achalasia 08/04/2011  . Incidental lung nodule, > 89mm and < 86mm 06/07/2011  . MVP (mitral valve prolapse)   . Transient ischemic attack   . History of back surgery   . Hyperlipidemia 12/19/2009  . Anxiety disorder 12/19/2009  . Depression 12/19/2009  . HYPERTENSION, BENIGN 12/19/2009  . ALLERGIC RHINITIS, SEASONAL 12/19/2009  . Obstructive chronic bronchitis without exacerbation, due to chronic aspiration Gold C 12/19/2009  . Esophageal reflux 12/19/2009    Past Surgical History:  Procedure Laterality Date  . BACK SURGERY     x 3  . BOTOX INJECTION  06/27/2012   Procedure: BOTOX INJECTION;  Surgeon: Garlan Fair, MD;  Location: [facility];  Service: Endoscopy;  Laterality: N/A;  . CHOLECYSTECTOMY    . ESOPHAGOGASTRODUODENOSCOPY  01/13/2012   Procedure: ESOPHAGOGASTRODUODENOSCOPY (EGD);  Surgeon: Garlan Fair, MD;  Location: [facility];  Service: Endoscopy;  Laterality: N/A;  botox /katrina  . ESOPHAGOGASTRODUODENOSCOPY  06/27/2012   Procedure: ESOPHAGOGASTRODUODENOSCOPY (EGD);  Surgeon: Garlan Fair, MD;  Location: [facility];  Service: Endoscopy;  Laterality: N/A;  . ESOPHAGOGASTRODUODENOSCOPY (EGD) WITH ESOPHAGEAL DILATION N/A 11/22/2012   Procedure: ESOPHAGOGASTRODUODENOSCOPY (EGD) WITH BOTOX INJECTION;  Surgeon: Garlan Fair, MD;  Location: [facility];  Service: Endoscopy;  Laterality: N/A;  . Esophagogastroduodenoscopy and [place] esophageal dilation.     x9  . ESOPHAGOSCOPY W/ BOTOX INJECTION     x9 beginning 2008, most recent  Sept. 2012  . eyebrow surgery  2012  . FOOT SURGERY Left    screw -fx  . LAPAROSCOPIC HELLER MYOTOMY  July 2014   [facility]  . MITRAL VALVE REPAIR  08/03/2011   Procedure: MINIMALLY INVASIVE MITRAL VALVE REPAIR (MVR);  Surgeon: Rexene Alberts, MD;  Location: [facility];  Service: Open Heart Surgery;  Laterality: Right;  minimally invasive MVR  . rt inguinal herniorrhaphy  1969/1997  . TEE WITHOUT CARDIOVERSION  07/06/2011   Procedure: TRANSESOPHAGEAL ECHOCARDIOGRAM (TEE);  Surgeon: Sueanne Margarita, MD;  Location: [facility] ENDOSCOPY;  Service: Cardiovascular;  Laterality: N/A;  . VIDEO BRONCHOSCOPY WITH ENDOBRONCHIAL NAVIGATION N/A 04/03/2014   Procedure: VIDEO BRONCHOSCOPY WITH ENDOBRONCHIAL NAVIGATION;  Surgeon: Collene Gobble, MD;  Location: [facility] OR;  Service: Thoracic;  Laterality: N/A;       Family History  Problem Relation Age of Onset  . Stroke Father 30       cva  . Scleroderma Mother 52  . Lung cancer Sister   . Cancer Sister   . Heart disease Sister   . Esophageal cancer Other        nephew  . Prostate cancer Paternal Uncle   . Anesthesia problems Neg Hx   . Colon cancer Neg Hx   . Liver cancer Neg Hx     Social History   Tobacco Use  . Smoking status: Former Smoker    Packs/day: 1.50    Years: 38.00    Pack years: 57.00    Types: Cigarettes    Quit date: 08/29/1997    Years since quitting: 22.0  . Smokeless tobacco: Never Used  Substance Use Topics  . Alcohol use: No  . Drug use: No    Home Medications Prior to Admission medications   Medication Sig Start Date End Date Taking? Authorizing Provider  albuterol (VENTOLIN HFA) 108 (90 Base) MCG/ACT inhaler INHALE 2 PUFFS BY MOUTH EVERY 4 HOURS AS NEEDED FOR WHEEZE 05/22/19   Parrett, Tammy S, NP  azelastine (ASTELIN) 0.1 % nasal spray Place 2 sprays into both nostrils at bedtime. Use in each nostril as directed 01/17/18   Parrett, Fonnie Mu, NP  calcium carbonate (TUMS EX) 750 MG chewable tablet Chew 1 tablet by mouth as needed  (for gas and reflux).     [provider]  cetirizine (ZYRTEC) 10 MG tablet Take 1 tablet (10 mg total) by mouth daily as needed for allergies. Reported on 10/27/2015 01/06/17   Biagio Borg, MD  Cholecalciferol (VITAMIN D3) 1000 UNITS CAPS Take 1,000 Units by mouth daily. Reported on 09/16/2015    [provider]  clonazePAM Bobbye [place]) 0.5 MG tablet take 1 tablet by mouth twice a day if needed 05/28/19   Biagio Borg, MD  diclofenac sodium (VOLTAREN) 1 % GEL Apply 2 g topically 4 (four) times daily. 08/29/18   Biagio Borg, MD  escitalopram (LEXAPRO) 10 MG tablet TAKE 1 TABLET BY MOUTH EVERY DAY 11/27/18   Biagio Borg, MD  fluticasone Asencion Islam)  50 MCG/ACT nasal spray Place 2 sprays into both nostrils daily. 09/14/17   Biagio Borg, MD  levothyroxine (SYNTHROID, LEVOTHROID) 25 MCG tablet TAKE 1 TABLET BY MOUTH EVERY DAY BEFORE BREAKFAST 11/27/18   Biagio Borg, MD  metroNIDAZOLE (METROGEL) 0.75 % gel Apply 1 application topically 2 (two) times daily.    [provider]  tiotropium (SPIRIVA HANDIHALER) 18 MCG inhalation capsule PLACE 1 CAPSULE INTO INHALER AND INHALE DAILY 08/03/19   Parrett, Fonnie Mu, NP  traMADol (ULTRAM) 50 MG tablet Take 1 tablet (50 mg total) by mouth every 12 (twelve) hours as needed. 05/28/19   Biagio Borg, MD    Allergies    Duac [clindamycin phos-benzoyl perox], Contrast media [iodinated diagnostic agents], Atorvastatin, Propoxyphene n-acetaminophen, Rosuvastatin, Metrizamide, and Other  Review of Systems   Review of Systems  Constitutional: Positive for chills and fatigue. Negative for fever.  HENT: Negative for ear pain and sore throat.   Eyes: Negative for pain and visual disturbance.  Respiratory: Positive for cough and shortness of breath.   Cardiovascular: Positive for chest pain. Negative for palpitations.  Gastrointestinal: Negative for abdominal pain and vomiting.  Genitourinary: Negative for dysuria and hematuria.    Musculoskeletal: Negative for arthralgias and back pain.  Skin: Negative for color change and rash.  Neurological: Negative for seizures and syncope.  All other systems reviewed and are negative.   Physical Exam Updated Vital Signs BP 128/71 (BP Location: Right Arm)   Pulse 74   Temp 97.9 F (36.6 C) (Oral)   Resp (!) 28   SpO2 90%   Physical Exam Vitals and nursing note reviewed.  Constitutional:      General: He is not in acute distress.    Appearance: He is well-developed. He is ill-appearing. He is not toxic-appearing or diaphoretic.  HENT:     Head: Normocephalic and atraumatic.     Right Ear: External ear normal.     Left Ear: External ear normal.     Nose: Nose normal. No congestion.     Mouth/Throat:     Mouth: Mucous membranes are moist.     Pharynx: Oropharynx is clear.  Eyes:     Conjunctiva/sclera: Conjunctivae normal.  Cardiovascular:     Rate and Rhythm: Normal rate and regular rhythm.     Heart sounds: No murmur.  Pulmonary:     Effort: No respiratory distress.     Breath sounds: Normal breath sounds. No stridor. No wheezing, rhonchi or rales.     Comments: Increased wob Tachypnea 4L [place] Chest:     Chest wall: No tenderness.  Abdominal:     Palpations: Abdomen is soft.     Tenderness: There is no abdominal tenderness. There is no guarding or rebound.     Hernia: No hernia is present.  Musculoskeletal:        General: No swelling or tenderness.     Cervical back: Neck supple.  Skin:    General: Skin is warm and dry.     Capillary Refill: Capillary refill takes less than 2 seconds.  Neurological:     General: No focal deficit present.     Mental Status: He is alert.  Psychiatric:        Mood and Affect: Mood normal.        Behavior: Behavior normal.     ED Results / Procedures / Treatments   Labs (all labs ordered are listed, but only abnormal results are displayed) Labs Reviewed  BASIC METABOLIC  PANEL - Abnormal; Notable for the following  components:      Result Value   Glucose, Bld 144 (*)    BUN 43 (*)    Creatinine, Ser 1.75 (*)    GFR calc non Af Amer 36 (*)    GFR calc Af Amer 42 (*)    All other components within normal limits  CBC - Abnormal; Notable for the following components:   Hemoglobin 17.4 (*)    HCT 52.6 (*)    All other components within normal limits  D-DIMER, QUANTITATIVE (NOT AT [facility]) - Abnormal; Notable for the following components:   D-Dimer, Quant 3.67 (*)    All other components within normal limits  FIBRINOGEN - Abnormal; Notable for the following components:   Fibrinogen 625 (*)    All other components within normal limits  POC SARS CORONAVIRUS 2 AG -  ED - Abnormal; Notable for the following components:   SARS Coronavirus 2 Ag POSITIVE (*)    All other components within normal limits  POC SARS CORONAVIRUS 2 AG -  ED - Abnormal; Notable for the following components:   SARS Coronavirus 2 Ag POSITIVE (*)    All other components within normal limits  TROPONIN I (HIGH SENSITIVITY) - Abnormal; Notable for the following components:   Troponin I (High Sensitivity) 18 (*)    All other components within normal limits  CULTURE, BLOOD (ROUTINE X 2)  CULTURE, BLOOD (ROUTINE X 2)  LACTIC ACID, PLASMA  LACTIC ACID, PLASMA  COMPREHENSIVE METABOLIC PANEL  PROCALCITONIN  LACTATE DEHYDROGENASE  FERRITIN  TRIGLYCERIDES  C-REACTIVE PROTEIN  APTT  PROTIME-INR  TROPONIN I (HIGH SENSITIVITY)    EKG EKG Interpretation  Date/Time:  Monday August 27 2019 22:17:24 EST Ventricular Rate:  76 PR Interval:    QRS Duration: 144 QT Interval:  389 QTC Calculation: 438 R Axis:   36 Text Interpretation: Sinus rhythm Atrial premature complexes Right bundle branch block No STEMI Confirmed by Nanda Quinton [phone]) on 08/27/2019 10:37:51 PM   Radiology DG Chest Port 1 View  Result Date: 08/27/2019 CLINICAL DATA:  Shortness of breath EXAM: PORTABLE CHEST 1 VIEW COMPARISON:  11/25/2016 FINDINGS: The heart size  and mediastinal contours are within normal limits. Prior mitral valve repair. Hyperinflated lungs. Prominent bibasilar interstitial markings, right slightly greater than left. No pleural effusion or pneumothorax. Stable right lower lobe calcified granuloma. IMPRESSION: Prominent bibasilar interstitial markings, right slightly greater than left. Findings could represent chronic interstitial lung disease or atypical/viral infection. Electronically Signed   By: Davina Poke D.O.   On: 08/27/2019 21:51    Procedures Procedures (including critical care time)  Medications Ordered in ED Medications  remdesivir 200 mg in sodium chloride 0.9% 250 mL IVPB (has no administration in time range)    Followed by  remdesivir 100 mg in sodium chloride 0.9 % 100 mL IVPB (has no administration in time range)  diphenhydrAMINE (BENADRYL) capsule 50 mg (has no administration in time range)    Or  diphenhydrAMINE (BENADRYL) injection 50 mg (has no administration in time range)  heparin ADULT infusion 100 units/mL (25000 units/240mL sodium chloride 0.45%) (has no administration in time range)  heparin bolus via infusion 6,400 Units (has no administration in time range)  dexamethasone (DECADRON) injection 10 mg (10 mg Intravenous Given 08/27/19 2229)    ED Course  I have reviewed the triage vital signs and the nursing notes.  Pertinent labs & imaging results that were available during my care of the patient were  reviewed by me and considered in my medical decision making (see chart for details).    MDM Rules/Calculators/A&P                      79 year old male presenting to the ED for shortness of breath in setting of known COPD and Covid infection.  On arrival patient was hemodynamically stable, afebrile, patient was tachypneic in the low 30s, on 4 L nasal cannula but otherwise in no respiratory distress.  No wheezing was noted, doubt COPD exacerbation however on the differential, lungs clear on exam, other  etiologies to consider be PE given his history of Covid infection tachypnea and respiratory stress.  CTA chest obtained, patient does have a allergic reaction to IV contrast which includes a rash and itching, pretreatment was given with Decadron as well as Benadryl, pending CT at this time.  Patient given Decadron as well as remdesivir in the ED given his hypoxia and new oxygen requirement.  Chest x-ray with some mild bilateral opacities but otherwise not impressive for multifocal infection.  Hold on antibiotics at this time given afebrile nature as well as lack of any other type of infection symptoms including cough or fevers.  Also started on hep drip prophylactically given possibility of waiting on CT scan and likelihood of PE. No contraindications. Pending bed placement at this time.  CBC reassuring, metabolic panel with a mild AKI noted.  Gently rehydrated in the emergency department as well.  Patient will be admitted to the hospitalist service for continued observation and management.   Admitted in stable condition.   Final Clinical Impression(s) / ED Diagnoses Final diagnoses:  COPD (chronic obstructive pulmonary disease) ([place])  SOB (shortness of breath)    Rx / DC Orders ED Discharge Orders    None       Kizzie Fantasia, MD 08/27/19 2313    Margette Fast, MD 08/28/19 1343

## 2019-08-27 NOTE — ED Notes (Signed)
MD was informed of COVID readout

## 2019-08-27 NOTE — Progress Notes (Signed)
ANTICOAGULATION CONSULT NOTE - Initial Consult  Pharmacy Consult for Heparin Indication: R/O PE   Allergies  Allergen Reactions  . Duac [Clindamycin Phos-Benzoyl Perox] Rash  . Contrast Media [Iodinated Diagnostic Agents] Rash    Heavy rash and itching 20 yrs ago. Needs full premeds and does well with this.  . Atorvastatin     muscle pain  . Propoxyphene N-Acetaminophen Itching    rash  . Rosuvastatin     muscle weakness  . Metrizamide Rash    radiopaque contrast agent. Heavy rash and itching 20 yrs ago. Needs full premeds and does well with this.  . Other Rash    High acid food--tomatoes, orange juice    Patient Measurements: Height: 5\' 8"  (172.7 cm) Weight: 177 lb 7.5 oz (80.5 kg) IBW/kg (Calculated) : 68.4 Heparin Dosing Weight: 80.5 kg  Vital Signs: Temp: 97.9 F (36.6 C) (01/11 2230) Temp Source: Oral (01/11 2230) BP: 114/74 (01/11 2245) Pulse Rate: 74 (01/11 2230)  Labs: Recent Labs    08/27/19 1910 08/27/19 2128 08/27/19 2215  HGB 17.4*  --   --   HCT 52.6*  --   --   PLT 260  --   --   CREATININE 1.75*  --  1.78*  TROPONINIHS 18* 17  --     Estimated Creatinine Clearance: 32 mL/min (A) (by C-G formula based on SCr of 1.78 mg/dL (H)).   Medical History: Past Medical History:  Diagnosis Date  . Achalasia   . Anxiety    causes shortness  . Arthritis   . COPD (chronic obstructive pulmonary disease) ([place])    with chronic SOB  . Depression   . Dilated aortic root ([place])    19mm by echo 03/2019  . FH: cholecystectomy   . Gallstones   . GERD (gastroesophageal reflux disease)   . Hiatal hernia   . History of alcoholism ([place])   . Hypercholesterolemia   . Hypothyroidism 01/12/2016  . Lesion of right lung 06/04/2013  . MVP (mitral valve prolapse)    with severe MR s/p MV repair  . Osteopenia   . Pneumonia 06/04/2013   aspirated food 14  . RBBB   . S/P right inguinal herniorrhaphy   . Seasonal allergic rhinitis   . Stroke ([place]) 10/2002  .  Transient ischemic attack     Medications:  Scheduled:  . [START ON 08/28/2019] diphenhydrAMINE  50 mg Oral Once   Or  . [START ON 08/28/2019] diphenhydrAMINE  50 mg Intravenous Once  . heparin  5,600 Units Intravenous Once    Assessment: Patient is a 68 yom that presents to the ED with 3 days of chest pain and SOB. The patient is requiring supplemental O2 and has a recent diagnosis of COVID. There is concern for a PE 2/2 sounding clear on exam and still requiring O2 supplementation. The patient has a contrast allergy and some renal dysfunction at this time so the CTPE is being delayed. Pharmacy has been asked to dose heparin empirically at this time for a PE.      Goal of Therapy:  Heparin level 0.3-0.7 units/ml Monitor platelets by anticoagulation protocol: Yes   Plan:  - Heparin Bolus of 5600 units IV x 1 dose  - Heparin drip starting at 1400 units/hr  - Heparin level in 8 hours  - Monitor patient for s/s of bleeding and cbc while on heparin    Duanne Limerick PharmD. BCPS  08/27/2019,11:23 PM

## 2019-08-28 ENCOUNTER — Inpatient Hospital Stay (HOSPITAL_COMMUNITY): Payer: BC Managed Care – PPO

## 2019-08-28 ENCOUNTER — Encounter (HOSPITAL_COMMUNITY): Payer: Self-pay | Admitting: Internal Medicine

## 2019-08-28 DIAGNOSIS — U071 COVID-19: Principal | ICD-10-CM

## 2019-08-28 DIAGNOSIS — J9600 Acute respiratory failure, unspecified whether with hypoxia or hypercapnia: Secondary | ICD-10-CM

## 2019-08-28 DIAGNOSIS — Z952 Presence of prosthetic heart valve: Secondary | ICD-10-CM

## 2019-08-28 DIAGNOSIS — F329 Major depressive disorder, single episode, unspecified: Secondary | ICD-10-CM

## 2019-08-28 DIAGNOSIS — Z79890 Hormone replacement therapy (postmenopausal): Secondary | ICD-10-CM

## 2019-08-28 DIAGNOSIS — Z79899 Other long term (current) drug therapy: Secondary | ICD-10-CM

## 2019-08-28 DIAGNOSIS — R0689 Other abnormalities of breathing: Secondary | ICD-10-CM | POA: Diagnosis not present

## 2019-08-28 DIAGNOSIS — R0902 Hypoxemia: Secondary | ICD-10-CM | POA: Diagnosis not present

## 2019-08-28 DIAGNOSIS — N183 Chronic kidney disease, stage 3 unspecified: Secondary | ICD-10-CM

## 2019-08-28 DIAGNOSIS — E039 Hypothyroidism, unspecified: Secondary | ICD-10-CM

## 2019-08-28 DIAGNOSIS — J449 Chronic obstructive pulmonary disease, unspecified: Secondary | ICD-10-CM | POA: Diagnosis not present

## 2019-08-28 DIAGNOSIS — R0602 Shortness of breath: Secondary | ICD-10-CM | POA: Diagnosis not present

## 2019-08-28 DIAGNOSIS — Z66 Do not resuscitate: Secondary | ICD-10-CM

## 2019-08-28 DIAGNOSIS — I499 Cardiac arrhythmia, unspecified: Secondary | ICD-10-CM | POA: Diagnosis not present

## 2019-08-28 DIAGNOSIS — E875 Hyperkalemia: Secondary | ICD-10-CM

## 2019-08-28 DIAGNOSIS — Z87891 Personal history of nicotine dependence: Secondary | ICD-10-CM

## 2019-08-28 LAB — POCT I-STAT 7, (LYTES, BLD GAS, ICA,H+H)
Acid-base deficit: 4 mmol/L — ABNORMAL HIGH (ref 0.0–2.0)
Bicarbonate: 19.6 mmol/L — ABNORMAL LOW (ref 20.0–28.0)
Calcium, Ion: 1.19 mmol/L (ref 1.15–1.40)
HCT: 46 % (ref 39.0–52.0)
Hemoglobin: 15.6 g/dL (ref 13.0–17.0)
O2 Saturation: 88 %
Patient temperature: 97.9
Potassium: 3.8 mmol/L (ref 3.5–5.1)
Sodium: 138 mmol/L (ref 135–145)
TCO2: 21 mmol/L — ABNORMAL LOW (ref 22–32)
pCO2 arterial: 30.5 mmHg — ABNORMAL LOW (ref 32.0–48.0)
pH, Arterial: 7.414 (ref 7.350–7.450)
pO2, Arterial: 51 mmHg — ABNORMAL LOW (ref 83.0–108.0)

## 2019-08-28 LAB — BPAM FFP
Blood Product Expiration Date: 202101131605
ISSUE DATE / TIME: 202101121647
Unit Type and Rh: 6200

## 2019-08-28 LAB — PREPARE FRESH FROZEN PLASMA

## 2019-08-28 LAB — TYPE AND SCREEN
ABO/RH(D): A POS
Antibody Screen: NEGATIVE

## 2019-08-28 LAB — BRAIN NATRIURETIC PEPTIDE: B Natriuretic Peptide: 77.5 pg/mL (ref 0.0–100.0)

## 2019-08-28 MED ORDER — ONDANSETRON HCL 4 MG PO TABS: 4.0000 mg | ORAL_TABLET | Freq: Four times a day (QID) | ORAL | Status: DC | PRN

## 2019-08-28 MED ORDER — CALCIUM CARBONATE ANTACID 750 MG PO CHEW: 1.0000 | CHEWABLE_TABLET | ORAL | Status: DC | PRN

## 2019-08-28 MED ORDER — TOCILIZUMAB 400 MG/20ML IV SOLN
8.0000 mg/kg | Freq: Once | INTRAVENOUS | Status: AC
  Administered 2019-08-28: 644 mg via INTRAVENOUS
  Filled 2019-08-28: qty 20

## 2019-08-28 MED ORDER — PANTOPRAZOLE SODIUM 40 MG PO TBEC
40.0000 mg | DELAYED_RELEASE_TABLET | Freq: Every day | ORAL | Status: DC
  Administered 2019-08-28 – 2019-09-10 (×14): 40 mg via ORAL
  Filled 2019-08-28 (×14): qty 1

## 2019-08-28 MED ORDER — ACETAMINOPHEN 650 MG RE SUPP: 650.0000 mg | Freq: Four times a day (QID) | RECTAL | Status: DC | PRN

## 2019-08-28 MED ORDER — ACETAMINOPHEN 325 MG PO TABS
650.0000 mg | ORAL_TABLET | Freq: Four times a day (QID) | ORAL | Status: DC | PRN
  Administered 2019-08-31 – 2019-09-10 (×6): 650 mg via ORAL
  Filled 2019-08-28 (×6): qty 2

## 2019-08-28 MED ORDER — LEVOTHYROXINE SODIUM 50 MCG PO TABS
25.0000 ug | ORAL_TABLET | Freq: Every day | ORAL | Status: DC
  Administered 2019-08-28 – 2019-09-10 (×14): 25 ug via ORAL
  Filled 2019-08-28 (×14): qty 1

## 2019-08-28 MED ORDER — DEXAMETHASONE SODIUM PHOSPHATE 10 MG/ML IJ SOLN
6.0000 mg | INTRAMUSCULAR | Status: DC
  Administered 2019-08-28 – 2019-08-31 (×4): 6 mg via INTRAVENOUS
  Filled 2019-08-28 (×4): qty 1

## 2019-08-28 MED ORDER — UMECLIDINIUM BROMIDE 62.5 MCG/INH IN AEPB
1.0000 | INHALATION_SPRAY | Freq: Every day | RESPIRATORY_TRACT | Status: DC
  Administered 2019-08-28 – 2019-09-10 (×14): 1 via RESPIRATORY_TRACT
  Filled 2019-08-28 (×3): qty 7

## 2019-08-28 MED ORDER — IOHEXOL 350 MG/ML SOLN
75.0000 mL | Freq: Once | INTRAVENOUS | Status: AC | PRN
  Administered 2019-08-28: 02:00:00 75 mL via INTRAVENOUS

## 2019-08-28 MED ORDER — ENOXAPARIN SODIUM 40 MG/0.4ML ~~LOC~~ SOLN: 40.0000 mg | SUBCUTANEOUS | Status: DC

## 2019-08-28 MED ORDER — ONDANSETRON HCL 4 MG/2ML IJ SOLN: 4.0000 mg | Freq: Four times a day (QID) | INTRAMUSCULAR | Status: DC | PRN

## 2019-08-28 MED ORDER — SODIUM CHLORIDE 0.9% IV SOLUTION: Freq: Once | INTRAVENOUS | Status: DC

## 2019-08-28 MED ORDER — ALBUTEROL SULFATE HFA 108 (90 BASE) MCG/ACT IN AERS
2.0000 | INHALATION_SPRAY | RESPIRATORY_TRACT | Status: DC | PRN
  Administered 2019-08-29 – 2019-09-10 (×4): 2 via RESPIRATORY_TRACT
  Filled 2019-08-28: qty 6.7

## 2019-08-28 MED ORDER — CALCIUM CARBONATE ANTACID 500 MG PO CHEW
2.0000 | CHEWABLE_TABLET | Freq: Four times a day (QID) | ORAL | Status: DC | PRN
  Administered 2019-08-28 – 2019-09-05 (×3): 400 mg via ORAL
  Filled 2019-08-28 (×6): qty 2

## 2019-08-28 MED ORDER — ENOXAPARIN SODIUM 40 MG/0.4ML ~~LOC~~ SOLN
40.0000 mg | SUBCUTANEOUS | Status: DC
  Administered 2019-08-28 – 2019-09-10 (×14): 40 mg via SUBCUTANEOUS
  Filled 2019-08-28 (×14): qty 0.4

## 2019-08-28 MED ORDER — CLONAZEPAM 0.5 MG PO TABS: 0.5000 mg | ORAL_TABLET | Freq: Two times a day (BID) | ORAL | Status: DC | PRN

## 2019-08-28 MED ORDER — TIOTROPIUM BROMIDE MONOHYDRATE 18 MCG IN CAPS: 18.0000 ug | ORAL_CAPSULE | Freq: Every day | RESPIRATORY_TRACT | Status: DC

## 2019-08-28 MED ORDER — ESCITALOPRAM OXALATE 10 MG PO TABS
10.0000 mg | ORAL_TABLET | Freq: Every day | ORAL | Status: DC
  Administered 2019-08-28 – 2019-09-10 (×14): 10 mg via ORAL
  Filled 2019-08-28 (×14): qty 1

## 2019-08-28 NOTE — Progress Notes (Signed)
PROGRESS NOTE    Chris Lewis  [id number] DOB: Mar 11, 1941 DOA: 08/27/2019 PCP: Biagio Borg, MD    Brief Narrative:  79 year old gentleman with history of COPD not on home oxygen, hypothyroidism, depression, status post mitral valve repair who presented to the emergency room with shortness of breath for about 4 days duration.  He had gradually worsening symptoms for about 1 week.  In the emergency room afebrile.  Creatinine 1.7.  Potassium 5.6.  CRP 8.8.  Covid test positive.  CTA negative for PE showing severe emphysema.   Assessment & Plan:   Principal Problem:   Acute respiratory failure due to COVID-19 [facility]) Active Problems:   MVP (mitral valve prolapse)   Dilated aortic root (HCC)   COPD (chronic obstructive pulmonary disease) (HCC)  Pneumonia due to COVID-19 virus, acute respiratory failure due to COVID-19 with underlying severe emphysema: Continue to monitor due to significant symptoms, currently remains on 15 L of nonrebreather. chest physiotherapy, incentive spirometry, deep breathing exercises, sputum induction, mucolytic's and bronchodilators. Supplemental oxygen to keep saturations more than 85 %. Covid directed therapy with , steroids, dexamethasone remdesivir, day 2/5 actemra, 800 mg given today convalescent plasma, 1 unit to be given today antibiotics, not indicated Due to severity of symptoms, patient will need daily inflammatory markers, chest x-rays, liver function test to monitor and direct COVID-19 therapies.  Convalescent plasma: Experimental.  patient with worsening hypoxemia and currently on 15 L of oxygen.  Discussed with patient that this is one of the experimental modality of COVID 19 treatment that involves giving plasma from patients who have recovered from infection.  This is one of the blood product. This may or may not benefit in the current state of disease. in some instatnces, it may improve survival and recovery.  Side effects include reactions  from plasma proteins that are foreign bodies and very rare chances of infections that are treatable.  Patient is agreeable for convalescent plasma transfusion and has signed the consent with witness.  CKD stage III with hyperkalemia: With no EKG changes.  Patient had received 500 mL bolus fluid in the ER.  Repeating lab test in the morning.  Hypothyroidism: On Synthroid.   DVT prophylaxis: Lovenox subcu Code Status: DNR Family Communication: Wife called, unable to pick up the phone Disposition Plan: Unknown.  Anticipate home after adequate clinical improvement.   Consultants:   None  Procedures:   None  Antimicrobials:  Anti-infectives (From admission, onward)   Start     Dose/Rate Route Frequency Ordered Stop   08/28/19 1000  remdesivir 100 mg in sodium chloride 0.9 % 100 mL IVPB     100 mg 200 mL/hr over 30 Minutes Intravenous Daily 08/27/19 2215 09/01/19 0959   08/27/19 2230  remdesivir 200 mg in sodium chloride 0.9% 250 mL IVPB     200 mg 580 mL/hr over 30 Minutes Intravenous Once 08/27/19 2215 08/28/19 0029         Subjective: Patient seen and examined.  Admitted early morning hours by nighttime hospitalist. He was being helped by nurse to go to bathroom.  Currently on 15 L of oxygen.  Patient was in moderate distress, however he was able to communicate.  No fever. Discussed and explained different modalities of treatment and patient is agreeable to all the treatments available.  Objective: Vitals:   08/28/19 0500 08/28/19 0654 08/28/19 0900 08/28/19 1200  BP:  114/70 118/90 118/75  Pulse: 63 67 69 73  Resp: 19 (!) 23 (!) 25 (!)  24  Temp:  98.2 F (36.8 C) (!) 97.4 F (36.3 C) 98 F (36.7 C)  TempSrc:  Oral Axillary Oral  SpO2: 91% 96% (!) 88% 91%  Weight:      Height:        Intake/Output Summary (Last 24 hours) at 08/28/2019 1317 Last data filed at 08/28/2019 0900 Gross per 24 hour  Intake 120 ml  Output 200 ml  Net -80 ml   Filed Weights   08/27/19  2300  Weight: 80.5 kg    Examination:  General exam: Appears in moderate respiratory distress, on nonrebreather. Respiratory system: Clear to auscultation. Respiratory effort increased.  No added sound. Cardiovascular system: S1 & S2 heard, RRR. No JVD, murmurs, rubs, gallops or clicks. No pedal edema. Gastrointestinal system: Abdomen is nondistended, soft and nontender. No organomegaly or masses felt. Normal bowel sounds heard. Central nervous system: Alert and oriented. No focal neurological deficits. Extremities: Symmetric 5 x 5 power. Skin: No rashes, lesions or ulcers Psychiatry: Judgement and insight appear normal. Mood & affect appropriate.     Data Reviewed: I have personally reviewed following labs and imaging studies  CBC: Recent Labs  Lab 08/27/19 1910 08/28/19 0215  WBC 9.1  --   HGB 17.4* 15.6  HCT 52.6* 46.0  MCV 90.5  --   PLT 260  --    Basic Metabolic Panel: Recent Labs  Lab 08/27/19 1910 08/27/19 2215 08/28/19 0215  NA 138 137 138  K 4.3 5.6* 3.8  CL 104 103  --   CO2 25 21*  --   GLUCOSE 144* 134*  --   BUN 43* 45*  --   CREATININE 1.75* 1.78*  --   CALCIUM 9.2 9.2  --    GFR: Estimated Creatinine Clearance: 32 mL/min (A) (by C-G formula based on SCr of 1.78 mg/dL (H)). Liver Function Tests: Recent Labs  Lab 08/27/19 2215  AST 74*  ALT 36  ALKPHOS 64  BILITOT 2.1*  PROT 7.7  ALBUMIN 3.7   No results for input(s): LIPASE, AMYLASE in the last 168 hours. No results for input(s): AMMONIA in the last 168 hours. Coagulation Profile: Recent Labs  Lab 08/27/19 2233  INR 1.0   Cardiac Enzymes: No results for input(s): CKTOTAL, CKMB, CKMBINDEX, TROPONINI in the last 168 hours. BNP (last 3 results) No results for input(s): PROBNP in the last 8760 hours. HbA1C: No results for input(s): HGBA1C in the last 72 hours. CBG: No results for input(s): GLUCAP in the last 168 hours. Lipid Profile: Recent Labs    08/27/19 2201  TRIG 139    Thyroid Function Tests: No results for input(s): TSH, T4TOTAL, FREET4, T3FREE, THYROIDAB in the last 72 hours. Anemia Panel: Recent Labs    08/27/19 2215  FERRITIN 1,041*   Sepsis Labs: Recent Labs  Lab 08/27/19 2201 08/27/19 2215  PROCALCITON  --  0.13  LATICACIDVEN 3.0*  --     No results found for this or any previous visit (from the past 240 hour(s)).       Radiology Studies: CT Angio Chest PE W and/or Wo Contrast  Result Date: 08/28/2019 CLINICAL DATA:  79 year old male positive COVID-19. Chest pain and shortness of breath. EXAM: CT ANGIOGRAPHY CHEST WITH CONTRAST TECHNIQUE: Multidetector CT imaging of the chest was performed using the standard protocol during bolus administration of intravenous contrast. Multiplanar CT image reconstructions and MIPs were obtained to evaluate the vascular anatomy. CONTRAST:  60mL OMNIPAQUE IOHEXOL 350 MG/ML SOLN COMPARISON:  CTA chest 12/12/2015.  FINDINGS: Cardiovascular: Good contrast bolus timing in the pulmonary arterial tree. Respiratory motion, especially in the lower lobes. Subsequently some lower lobe branch detail is degraded but otherwise No focal filling defect identified in the pulmonary arteries to suggest acute pulmonary embolism. Stable borderline to mild cardiomegaly since 2017. No contrast in the aorta today. Stable tortuosity of the thoracic aorta. Mediastinum/Nodes: Negative. No lymphadenopathy. Lungs/Pleura: Major airways are patent. Severe chronic emphysema. Right upper chronic architectural distortion. Superimposed confluent bilateral middle and lower lobe ground-glass opacity on series 7, image 72. Posterior lower lobe involvement trending toward consolidation. The costophrenic angles are relatively spared. No pleural effusion. Chronic calcified granuloma in the superior segment of the right lower lobe. Upper Abdomen: Surgically absent gallbladder. Negative visible noncontrast liver, spleen, pancreas, adrenal glands and  kidneys. Small hiatal hernia but otherwise negative visible bowel. Musculoskeletal: Osteopenia. No acute osseous abnormality identified. Review of the MIP images confirms the above findings. IMPRESSION: 1. No acute pulmonary embolus identified. 2. Severe Emphysema (ICD10-J43.9) with superimposed bilateral middle and lower lobe ground-glass opacity compatible with COVID-19 Pneumonia in this setting. No pleural effusion. Electronically Signed   By: Genevie Ann M.D.   On: 08/28/2019 02:43   DG Chest Port 1 View  Result Date: 08/27/2019 CLINICAL DATA:  Shortness of breath EXAM: PORTABLE CHEST 1 VIEW COMPARISON:  11/25/2016 FINDINGS: The heart size and mediastinal contours are within normal limits. Prior mitral valve repair. Hyperinflated lungs. Prominent bibasilar interstitial markings, right slightly greater than left. No pleural effusion or pneumothorax. Stable right lower lobe calcified granuloma. IMPRESSION: Prominent bibasilar interstitial markings, right slightly greater than left. Findings could represent chronic interstitial lung disease or atypical/viral infection. Electronically Signed   By: Davina Poke D.O.   On: 08/27/2019 21:51        Scheduled Meds:  sodium chloride   Intravenous Once   dexamethasone (DECADRON) injection  6 mg Intravenous Q24H   enoxaparin (LOVENOX) injection  40 mg Subcutaneous Q24H   escitalopram  10 mg Oral Daily   levothyroxine  25 mcg Oral QAC breakfast   pantoprazole  40 mg Oral Daily   umeclidinium bromide  1 puff Inhalation Daily   Continuous Infusions:  remdesivir 100 mg in NS 100 mL 100 mg (08/28/19 0828)     LOS: 1 day    Time spent: Additional 30 minutes.    Barb Merino, MD [facility] Pager [phone]

## 2019-08-28 NOTE — ED Notes (Signed)
Called [facility] to see if they could come get patient--Chris Lewis

## 2019-08-28 NOTE — ED Notes (Signed)
Called [facility] for transport to [facility]--advised they will not have a truck until after shift change--Chris Lewis

## 2019-08-28 NOTE — ED Notes (Addendum)
Pts O2 dropped to 79% on 4Lo2. MD made aware. Pt placed on 15LNRB, O2 SAT is now 91%. Pt is now feeling a little better. Fluids stopped per MD orders.

## 2019-08-28 NOTE — ED Notes (Signed)
  Pt leaving facility under care from [facility].

## 2019-08-28 NOTE — Progress Notes (Signed)
After detailed explanation of convalescent plasma by the nurse to the patient, he was hesitant to accept transfusion.  He had earlier consented for blood products with me.  Patient states he will think about it.  Use of convalescent plasma has been effective in early phase of the disease.  Since patient is reluctant, these are not 100% effective therapies, plasma orders will be canceled.

## 2019-08-28 NOTE — H&P (Signed)
History and Physical    Chris Lewis [id number] DOB: May 21, 1941 DOA: 08/27/2019  PCP: Biagio Borg, MD  Patient coming from: Home.  Chief Complaint: Shortness of breath.  HPI: Chris Lewis is a 79 y.o. male with history of COPD, hypothyroidism, depression, status post mitral valve repair presents to the ER because of worsening shortness of breath.  Patient states he was exposed to COVID-19 about August 15, 2019.  Following which patient became more symptomatic with gradual worsening of shortness of breath.  Over the last 4 days patient having worsening shortness of breath even at rest with nonproductive cough and bilateral pleuritic type of chest pain.  Subjective feeling of fever chills and diarrhea.  Denies nausea vomiting or abdominal pain.  ED Course: In the ER patient had a temperature of 97.9 with lab work showing creatinine of 1.7 potassium 5.6 CRP 8.8 ferritin 1041, high-sensitivity troponin was 18 and 17 procalcitonin 0.13 hemoglobin 17.4 EKG was showing sinus rhythm with RBBB.  Covid test was positive CT angiogram was negative for pulmonary embolism and was showing severe COPD.  Patient admitted for acute respiratory failure with hypoxia secondary to Covid infection at this time requiring nonrebreather to maintain sats.  Review of Systems: As per HPI, rest all negative.   Past Medical History:  Diagnosis Date  . Achalasia   . Anxiety    causes shortness  . Arthritis   . COPD (chronic obstructive pulmonary disease) ([place])    with chronic SOB  . Depression   . Dilated aortic root ([place])    18mm by echo 03/2019  . FH: cholecystectomy   . Gallstones   . GERD (gastroesophageal reflux disease)   . Hiatal hernia   . History of alcoholism ([place])   . Hypercholesterolemia   . Hypothyroidism 01/12/2016  . Lesion of right lung 06/04/2013  . MVP (mitral valve prolapse)    with severe MR s/p MV repair  . Osteopenia   . Pneumonia 06/04/2013   aspirated food 14  . RBBB   .  S/P right inguinal herniorrhaphy   . Seasonal allergic rhinitis   . Stroke ([place]) 10/2002  . Transient ischemic attack     Past Surgical History:  Procedure Laterality Date  . BACK SURGERY     x 3  . BOTOX INJECTION  06/27/2012   Procedure: BOTOX INJECTION;  Surgeon: Garlan Fair, MD;  Location: [facility];  Service: Endoscopy;  Laterality: N/A;  . CHOLECYSTECTOMY    . ESOPHAGOGASTRODUODENOSCOPY  01/13/2012   Procedure: ESOPHAGOGASTRODUODENOSCOPY (EGD);  Surgeon: Garlan Fair, MD;  Location: [facility];  Service: Endoscopy;  Laterality: N/A;  botox /katrina  . ESOPHAGOGASTRODUODENOSCOPY  06/27/2012   Procedure: ESOPHAGOGASTRODUODENOSCOPY (EGD);  Surgeon: Garlan Fair, MD;  Location: [facility];  Service: Endoscopy;  Laterality: N/A;  . ESOPHAGOGASTRODUODENOSCOPY (EGD) WITH ESOPHAGEAL DILATION N/A 11/22/2012   Procedure: ESOPHAGOGASTRODUODENOSCOPY (EGD) WITH BOTOX INJECTION;  Surgeon: Garlan Fair, MD;  Location: [facility];  Service: Endoscopy;  Laterality: N/A;  . Esophagogastroduodenoscopy and Savary esophageal dilation.     x9  . ESOPHAGOSCOPY W/ BOTOX INJECTION     x9 beginning 2008, most recent Sept. 2012  . eyebrow surgery  2012  . FOOT SURGERY Left    screw -fx  . LAPAROSCOPIC HELLER MYOTOMY  July 2014   [facility]  . MITRAL VALVE REPAIR  08/03/2011   Procedure: MINIMALLY INVASIVE MITRAL VALVE REPAIR (MVR);  Surgeon: Rexene Alberts, MD;  Location: [facility];  Service: Open Heart  Surgery;  Laterality: Right;  minimally invasive MVR  . rt inguinal herniorrhaphy  1969/1997  . TEE WITHOUT CARDIOVERSION  07/06/2011   Procedure: TRANSESOPHAGEAL ECHOCARDIOGRAM (TEE);  Surgeon: Sueanne Margarita, MD;  Location: [facility];  Service: Cardiovascular;  Laterality: N/A;  . VIDEO BRONCHOSCOPY WITH ENDOBRONCHIAL NAVIGATION N/A 04/03/2014   Procedure: VIDEO BRONCHOSCOPY WITH ENDOBRONCHIAL NAVIGATION;  Surgeon: Collene Gobble, MD;  Location: [facility];  Service: Thoracic;  Laterality:  N/A;     reports that he quit smoking about 22 years ago. His smoking use included cigarettes. He has a 57.00 pack-year smoking history. He has never used smokeless tobacco. He reports that he does not drink alcohol or use drugs.  Allergies  Allergen Reactions  . Duac [Clindamycin Phos-Benzoyl Perox] Rash  . Contrast Media [Iodinated Diagnostic Agents] Rash    Heavy rash and itching 20 yrs ago. Needs full premeds and does well with this.  . Atorvastatin     muscle pain  . Propoxyphene N-Acetaminophen Itching    rash  . Rosuvastatin     muscle weakness  . Metrizamide Rash    radiopaque contrast agent. Heavy rash and itching 20 yrs ago. Needs full premeds and does well with this.  . Other Rash    High acid food--tomatoes, orange juice    Family History  Problem Relation Age of Onset  . Stroke Father 74       cva  . Scleroderma Mother 34  . Lung cancer Sister   . Cancer Sister   . Heart disease Sister   . Esophageal cancer Other        nephew  . Prostate cancer Paternal Uncle   . Anesthesia problems Neg Hx   . Colon cancer Neg Hx   . Liver cancer Neg Hx     Prior to Admission medications   Medication Sig Start Date End Date Taking? Authorizing Provider  albuterol (VENTOLIN HFA) 108 (90 Base) MCG/ACT inhaler INHALE 2 PUFFS BY MOUTH EVERY 4 HOURS AS NEEDED FOR WHEEZE Patient taking differently: Inhale 2 puffs into the lungs every 4 (four) hours as needed for wheezing.  05/22/19  Yes Parrett, Tammy S, NP  azelastine (ASTELIN) 0.1 % nasal spray Place 2 sprays into both nostrils at bedtime. Use in each nostril as directed 01/17/18  Yes Parrett, Tammy S, NP  calcium carbonate (TUMS EX) 750 MG chewable tablet Chew 1 tablet by mouth as needed (for gas and reflux).    Yes [provider]  cetirizine (ZYRTEC) 10 MG tablet Take 1 tablet (10 mg total) by mouth daily as needed for allergies. Reported on 10/27/2015 01/06/17  Yes Biagio Borg, MD  Cholecalciferol (VITAMIN D3) 1000  UNITS CAPS Take 1,000 Units by mouth daily. Reported on 09/16/2015   Yes [provider]  clonazePAM (KLONOPIN) 0.5 MG tablet take 1 tablet by mouth twice a day if needed Patient taking differently: Take 0.5 mg by mouth 2 (two) times daily as needed for anxiety.  05/28/19  Yes Biagio Borg, MD  diclofenac sodium (VOLTAREN) 1 % GEL Apply 2 g topically 4 (four) times daily. 08/29/18  Yes Biagio Borg, MD  escitalopram (LEXAPRO) 10 MG tablet TAKE 1 TABLET BY MOUTH EVERY DAY Patient taking differently: Take 10 mg by mouth daily.  11/27/18  Yes Biagio Borg, MD  levothyroxine (SYNTHROID, LEVOTHROID) 25 MCG tablet TAKE 1 TABLET BY MOUTH EVERY DAY BEFORE BREAKFAST Patient taking differently: Take 25 mcg by mouth daily before breakfast.  11/27/18  Yes Biagio Borg, MD  metroNIDAZOLE (METROGEL) 0.75 % gel Apply 1 application topically 2 (two) times daily.   Yes [provider]  pantoprazole (PROTONIX) 40 MG tablet Take 40 mg by mouth daily. 05/11/19  Yes [provider]  tiotropium (SPIRIVA HANDIHALER) 18 MCG inhalation capsule PLACE 1 CAPSULE INTO INHALER AND INHALE DAILY Patient taking differently: Place 18 mcg into inhaler and inhale daily.  08/03/19  Yes Parrett, Tammy S, NP  traMADol (ULTRAM) 50 MG tablet Take 1 tablet (50 mg total) by mouth every 12 (twelve) hours as needed. Patient taking differently: Take 50 mg by mouth every 12 (twelve) hours as needed for moderate pain.  05/28/19  Yes Biagio Borg, MD  fluticasone [facility]) 50 MCG/ACT nasal spray Place 2 sprays into both nostrils daily. Patient not taking: Reported on 08/28/2019 09/14/17   Biagio Borg, MD    Physical Exam: Constitutional: Moderately built and nourished. Vitals:   08/27/19 2345 08/28/19 0000 08/28/19 0015 08/28/19 0030  BP: 113/82 131/78 120/83 118/82  Pulse: 71 71 69 66  Resp: (!) 29 (!) 25 (!) 32 (!) 31  Temp:      TempSrc:      SpO2: (!) 88% 91% 92% 91%  Weight:      Height:       Eyes:  Anicteric no pallor. ENMT: No discharge from the ears eyes nose or mouth. Neck: No mass felt.  No neck rigidity but no JVD appreciated. Respiratory: No rhonchi or crepitations. Cardiovascular: S1-S2 heard. Abdomen: Soft nontender bowel sounds present. Musculoskeletal: No edema.  No joint effusion. Skin: No rash. Neurologic: Alert awake oriented to time place and person.  Moves all extremities. Psychiatric: Appears normal with normal affect.   Labs on Admission: I have personally reviewed following labs and imaging studies  CBC: Recent Labs  Lab 08/27/19 1910  WBC 9.1  HGB 17.4*  HCT 52.6*  MCV 90.5  PLT 123456   Basic Metabolic Panel: Recent Labs  Lab 08/27/19 1910 08/27/19 2215  NA 138 137  K 4.3 5.6*  CL 104 103  CO2 25 21*  GLUCOSE 144* 134*  BUN 43* 45*  CREATININE 1.75* 1.78*  CALCIUM 9.2 9.2   GFR: Estimated Creatinine Clearance: 32 mL/min (A) (by C-G formula based on SCr of 1.78 mg/dL (H)). Liver Function Tests: Recent Labs  Lab 08/27/19 2215  AST 74*  ALT 36  ALKPHOS 64  BILITOT 2.1*  PROT 7.7  ALBUMIN 3.7   No results for input(s): LIPASE, AMYLASE in the last 168 hours. No results for input(s): AMMONIA in the last 168 hours. Coagulation Profile: Recent Labs  Lab 08/27/19 2233  INR 1.0   Cardiac Enzymes: No results for input(s): CKTOTAL, CKMB, CKMBINDEX, TROPONINI in the last 168 hours. BNP (last 3 results) No results for input(s): PROBNP in the last 8760 hours. HbA1C: No results for input(s): HGBA1C in the last 72 hours. CBG: No results for input(s): GLUCAP in the last 168 hours. Lipid Profile: Recent Labs    08/27/19 2201  TRIG 139   Thyroid Function Tests: No results for input(s): TSH, T4TOTAL, FREET4, T3FREE, THYROIDAB in the last 72 hours. Anemia Panel: Recent Labs    08/27/19 2215  FERRITIN 1,041*   Urine analysis:    Component Value Date/Time   COLORURINE YELLOW 08/29/2018 [place] 08/29/2018 1256    LABSPEC 1.020 08/29/2018 1256   PHURINE 6.0 08/29/2018 1256   GLUCOSEU NEGATIVE 08/29/2018 1256   [place] 08/29/2018  [place] 08/29/2018 [place] 08/29/2018 [place] 07/30/2011 1356   UROBILINOGEN 0.2 08/29/2018 1256   NITRITE NEGATIVE 08/29/2018 1256   LEUKOCYTESUR NEGATIVE 08/29/2018 1256   Sepsis Labs: @LABRCNTIP (procalcitonin:4,lacticidven:4) )No results found for this or any previous visit (from the past 240 hour(s)).   Radiological Exams on Admission: DG Chest Port 1 View  Result Date: 08/27/2019 CLINICAL DATA:  Shortness of breath EXAM: PORTABLE CHEST 1 VIEW COMPARISON:  11/25/2016 FINDINGS: The heart size and mediastinal contours are within normal limits. Prior mitral valve repair. Hyperinflated lungs. Prominent bibasilar interstitial markings, right slightly greater than left. No pleural effusion or pneumothorax. Stable right lower lobe calcified granuloma. IMPRESSION: Prominent bibasilar interstitial markings, right slightly greater than left. Findings could represent chronic interstitial lung disease or atypical/viral infection. Electronically Signed   By: Davina Poke D.O.   On: 08/27/2019 21:51    EKG: Independently reviewed.  Normal sinus rhythm with RBBB.  Assessment/Plan Principal Problem:   Acute respiratory failure due to COVID-19 [facility]) Active Problems:   MVP (mitral valve prolapse)   Dilated aortic root (HCC)   COPD (chronic obstructive pulmonary disease) ([place])    1. Acute respiratory failure secondary to Covid infection for which I have started patient on IV remdesivir and IV Decadron and after discussing with patient about the off label use of Actemra is contraindications side effects patient agrees to get it.  I have ordered 1 dose.  Follow respiratory status and inflammatory markers. 2. Chronic kidney disease stage III with hyperkalemia EKG does not show any changes.  Patient received some fluids  in the ER.  Will repeat metabolic panel closely follow creatinine and potassium levels. 3. COPD not actively wheezing continue home inhalers. 4. History of mitral valve repair. 5. Hypothyroidism on Synthroid.  Given that patient has acute respiratory failure with hypoxia requiring nonrebreather will need inpatient status.   DVT prophylaxis: Lovenox. Code Status: DNR as confirmed with patient. Family Communication: Discussed with patient. Disposition Plan: Home. Consults called: None. Admission status: Inpatient.   Rise Patience MD [facility] Pager [phone].  If 7PM-7AM, please contact night-coverage [email or web address] Password [facility]  08/28/2019, 12:42 AM

## 2019-08-29 DIAGNOSIS — J9600 Acute respiratory failure, unspecified whether with hypoxia or hypercapnia: Secondary | ICD-10-CM | POA: Diagnosis not present

## 2019-08-29 DIAGNOSIS — U071 COVID-19: Principal | ICD-10-CM

## 2019-08-29 LAB — CBC WITH DIFFERENTIAL/PLATELET
Abs Immature Granulocytes: 0.06 10*3/uL (ref 0.00–0.07)
Basophils Absolute: 0 10*3/uL (ref 0.0–0.1)
Basophils Relative: 0 %
Eosinophils Absolute: 0 10*3/uL (ref 0.0–0.5)
Eosinophils Relative: 0 %
HCT: 48 % (ref 39.0–52.0)
Hemoglobin: 15.9 g/dL (ref 13.0–17.0)
Immature Granulocytes: 1 %
Lymphocytes Relative: 6 %
Lymphs Abs: 0.7 10*3/uL (ref 0.7–4.0)
MCH: 29.8 pg (ref 26.0–34.0)
MCHC: 33.1 g/dL (ref 30.0–36.0)
MCV: 89.9 fL (ref 80.0–100.0)
Monocytes Absolute: 1 10*3/uL (ref 0.1–1.0)
Monocytes Relative: 9 %
Neutro Abs: 9.1 10*3/uL — ABNORMAL HIGH (ref 1.7–7.7)
Neutrophils Relative %: 84 %
Platelets: 272 10*3/uL (ref 150–400)
RBC: 5.34 MIL/uL (ref 4.22–5.81)
RDW: 13.2 % (ref 11.5–15.5)
WBC: 10.9 10*3/uL — ABNORMAL HIGH (ref 4.0–10.5)
nRBC: 0 % (ref 0.0–0.2)

## 2019-08-29 LAB — COMPREHENSIVE METABOLIC PANEL
ALT: 31 U/L (ref 0–44)
AST: 45 U/L — ABNORMAL HIGH (ref 15–41)
Albumin: 3.6 g/dL (ref 3.5–5.0)
Alkaline Phosphatase: 54 U/L (ref 38–126)
Anion gap: 11 (ref 5–15)
BUN: 49 mg/dL — ABNORMAL HIGH (ref 8–23)
CO2: 24 mmol/L (ref 22–32)
Calcium: 9.1 mg/dL (ref 8.9–10.3)
Chloride: 105 mmol/L (ref 98–111)
Creatinine, Ser: 1.16 mg/dL (ref 0.61–1.24)
GFR calc Af Amer: >60 mL/min (ref >60)
GFR calc non Af Amer: 59 mL/min — ABNORMAL LOW (ref >60)
Glucose, Bld: 144 mg/dL — ABNORMAL HIGH (ref 70–99)
Potassium: 4.4 mmol/L (ref 3.5–5.1)
Sodium: 140 mmol/L (ref 135–145)
Total Bilirubin: 1.2 mg/dL (ref 0.3–1.2)
Total Protein: 7.1 g/dL (ref 6.5–8.1)

## 2019-08-29 LAB — D-DIMER, QUANTITATIVE: D-Dimer, Quant: 3.73 ug/mL-FEU — ABNORMAL HIGH (ref 0.00–0.50)

## 2019-08-29 LAB — C-REACTIVE PROTEIN: CRP: 6 mg/dL — ABNORMAL HIGH (ref <1.0)

## 2019-08-29 LAB — ABO/RH: ABO/RH(D): A POS

## 2019-08-29 LAB — FERRITIN: Ferritin: 692 ng/mL — ABNORMAL HIGH (ref 24–336)

## 2019-08-29 MED ORDER — SODIUM CHLORIDE 0.9% IV SOLUTION: Freq: Once | INTRAVENOUS | Status: AC

## 2019-08-29 NOTE — Progress Notes (Signed)
[name]  [id number] DOB: Jul 17, 1941 DOA: 08/27/2019 PCP: Biagio Borg, MD    Brief Narrative:  79 year old with a history of COPD, hypothyroidism, depression, status post mitral valve repair who presented to the ED with shortness of breath x4 days.  In the ED his Covid test was positive and a CT angiogram noted severe emphysema with no evidence of a pulmonary embolism.  Significant Events:   COVID-19 specific Treatment: Decadron 1/11 > Remdesivir 1/11 > Refused convalescent plasma 1/12 Actemra 1/12  Antimicrobials:  None  Subjective: Requiring 15 L nonrebreather oxygen support.  Is in good spirits and tells me that he feels somewhat better today.  Has reconsidered convalescent plasma and ask if he can still get it today.  Denies chest pain nausea vomiting abdominal pain.  Assessment & Plan:  COVID Pneumonia -acute hypoxic respiratory failure Continue remdesivir and Decadron -has been dosed with Actemra -was previously consented for convalescent plasma then changed his mind though has now changed his mind again and wishes to receive it -I have provided him an opportunity to ask any questions and denying any he has signed the consent form and I have therefore placed an order for him to get 1 unit of convalescent plasma  Severe COPD No active wheezing at time of exam today  CKD stage III Creatinine improving -follow trend  Hyperkalemia Resolved  Hypothyroidism Continue usual home medical therapy  DVT prophylaxis: Lovenox Code Status: NO CODE BLUE -DNR Family Communication:  Disposition Plan: Progressive care  Consultants:  none  Objective: Blood pressure 126/76, pulse 61, temperature (!) 97.4 F (36.3 C), temperature source Oral, resp. rate 18, height 5\' 8"  (1.727 m), weight 80.5 kg, SpO2 90 %.  Intake/Output Summary (Last 24 hours) at 08/29/2019 0938 Last data filed at 08/28/2019 2128 Gross per 24 hour  Intake 460.32 ml  Output 325 ml  Net 135.32 ml    Filed Weights   08/27/19 2300  Weight: 80.5 kg    Examination: General: No acute respiratory distress Lungs: Fine diffuse crackles with no wheezing Cardiovascular: Regular rate and rhythm without murmur gallop or rub normal S1 and S2 Abdomen: Nontender, nondistended, soft, bowel sounds positive, no rebound, no ascites, no appreciable mass Extremities: No significant cyanosis, clubbing, or edema bilateral lower extremities  CBC: Recent Labs  Lab 08/27/19 1910 08/28/19 0215 08/29/19 0502  WBC 9.1  --  10.9*  NEUTROABS  --   --  9.1*  HGB 17.4* 15.6 15.9  HCT 52.6* 46.0 48.0  MCV 90.5  --  89.9  PLT 260  --  [id number]   Basic Metabolic Panel: Recent Labs  Lab 08/27/19 1910 08/27/19 2215 08/28/19 0215 08/29/19 0502  NA 138 137 138 140  K 4.3 5.6* 3.8 4.4  CL 104 103  --  105  CO2 25 21*  --  24  GLUCOSE 144* 134*  --  144*  BUN 43* 45*  --  49*  CREATININE 1.75* 1.78*  --  1.16  CALCIUM 9.2 9.2  --  9.1   GFR: Estimated Creatinine Clearance: 49.1 mL/min (by C-G formula based on SCr of 1.16 mg/dL).  Liver Function Tests: Recent Labs  Lab 08/27/19 2215 08/29/19 0502  AST 74* 45*  ALT 36 31  ALKPHOS 64 54  BILITOT 2.1* 1.2  PROT 7.7 7.1  ALBUMIN 3.7 3.6    Coagulation Profile: Recent Labs  Lab 08/27/19 2233  INR 1.0    HbA1C: Hgb A1c MFr Bld  Date/Time Value Ref Range  Status  08/29/2018 12:09 PM 5.9 4.6 - 6.5 % Final    Comment:    Glycemic Control Guidelines for People with Diabetes:Non Diabetic:  <6%Goal of Therapy: <7%Additional Action Suggested:  >8%   07/06/2017 11:52 AM 6.0 4.6 - 6.5 % Final    Comment:    Glycemic Control Guidelines for People with Diabetes:Non Diabetic:  <6%Goal of Therapy: <7%Additional Action Suggested:  >8%     CBG: No results for input(s): GLUCAP in the last 168 hours.  Recent Results (from the past 240 hour(s))  Blood Culture (routine x 2)     Status: None (Preliminary result)   Collection Time: 08/27/19 10:17 PM    Specimen: BLOOD  Result Value Ref Range Status   Specimen Description BLOOD LEFT ANTECUBITAL  Final   Special Requests   Final    BOTTLES DRAWN AEROBIC AND ANAEROBIC Blood Culture adequate volume   Culture   Final    NO GROWTH < 24 HOURS Performed at [facility], [address] [place], [place] [address]    Report Status PENDING  Incomplete     Scheduled Meds: . sodium chloride   Intravenous Once  . dexamethasone (DECADRON) injection  6 mg Intravenous Q24H  . enoxaparin (LOVENOX) injection  40 mg Subcutaneous Q24H  . escitalopram  10 mg Oral Daily  . levothyroxine  25 mcg Oral QAC breakfast  . pantoprazole  40 mg Oral Daily  . umeclidinium bromide  1 puff Inhalation Daily   Continuous Infusions: . remdesivir 100 mg in NS 100 mL 100 mg (08/29/19 0751)     LOS: 2 days   Cherene Altes, MD [facility] Office  [phone] Pager - Text Page per Shea Evans  If 7PM-7AM, please contact night-coverage per Amion 08/29/2019, 9:38 AM

## 2019-08-30 LAB — PREPARE FRESH FROZEN PLASMA

## 2019-08-30 LAB — FERRITIN: Ferritin: 595 ng/mL — ABNORMAL HIGH (ref 24–336)

## 2019-08-30 LAB — D-DIMER, QUANTITATIVE: D-Dimer, Quant: 3.99 ug/mL-FEU — ABNORMAL HIGH (ref 0.00–0.50)

## 2019-08-30 LAB — CBC WITH DIFFERENTIAL/PLATELET
Abs Immature Granulocytes: 0.07 10*3/uL (ref 0.00–0.07)
Basophils Absolute: 0 10*3/uL (ref 0.0–0.1)
Basophils Relative: 0 %
Eosinophils Absolute: 0 10*3/uL (ref 0.0–0.5)
Eosinophils Relative: 0 %
HCT: 48 % (ref 39.0–52.0)
Hemoglobin: 15.9 g/dL (ref 13.0–17.0)
Immature Granulocytes: 1 %
Lymphocytes Relative: 5 %
Lymphs Abs: 0.5 10*3/uL — ABNORMAL LOW (ref 0.7–4.0)
MCH: 29.9 pg (ref 26.0–34.0)
MCHC: 33.1 g/dL (ref 30.0–36.0)
MCV: 90.2 fL (ref 80.0–100.0)
Monocytes Absolute: 0.8 10*3/uL (ref 0.1–1.0)
Monocytes Relative: 7 %
Neutro Abs: 9.7 10*3/uL — ABNORMAL HIGH (ref 1.7–7.7)
Neutrophils Relative %: 87 %
Platelets: 261 10*3/uL (ref 150–400)
RBC: 5.32 MIL/uL (ref 4.22–5.81)
RDW: 13.2 % (ref 11.5–15.5)
WBC: 11.1 10*3/uL — ABNORMAL HIGH (ref 4.0–10.5)
nRBC: 0 % (ref 0.0–0.2)

## 2019-08-30 LAB — COMPREHENSIVE METABOLIC PANEL
ALT: 31 U/L (ref 0–44)
AST: 39 U/L (ref 15–41)
Albumin: 3.7 g/dL (ref 3.5–5.0)
Alkaline Phosphatase: 62 U/L (ref 38–126)
Anion gap: 8 (ref 5–15)
BUN: 39 mg/dL — ABNORMAL HIGH (ref 8–23)
CO2: 26 mmol/L (ref 22–32)
Calcium: 9.1 mg/dL (ref 8.9–10.3)
Chloride: 107 mmol/L (ref 98–111)
Creatinine, Ser: 1.09 mg/dL (ref 0.61–1.24)
GFR calc Af Amer: >60 mL/min (ref >60)
GFR calc non Af Amer: >60 mL/min (ref >60)
Glucose, Bld: 133 mg/dL — ABNORMAL HIGH (ref 70–99)
Potassium: 4.7 mmol/L (ref 3.5–5.1)
Sodium: 141 mmol/L (ref 135–145)
Total Bilirubin: 1.1 mg/dL (ref 0.3–1.2)
Total Protein: 6.9 g/dL (ref 6.5–8.1)

## 2019-08-30 LAB — BPAM FFP
Blood Product Expiration Date: 202101142255
ISSUE DATE / TIME: 202101132301
Unit Type and Rh: 6200

## 2019-08-30 LAB — C-REACTIVE PROTEIN: CRP: 2.8 mg/dL — ABNORMAL HIGH (ref <1.0)

## 2019-08-30 NOTE — Progress Notes (Signed)
Chris Lewis  [id number] DOB: 1940-09-22 DOA: 08/27/2019 PCP: Biagio Borg, MD    Brief Narrative:  79yo with a history of COPD, hypothyroidism, depression, and status post mitral valve repair who presented to the ED with shortness of breath x4 days.  In the ED his Covid test was positive and a CT angiogram noted severe emphysema with no evidence of a pulmonary embolism.  Significant Events: 1/11 presented to [facility] ED - COVID + 1/12 transfer to [facility]  COVID-19 specific Treatment: Decadron 1/11 > Remdesivir 1/11 > Convalescent plasma 1/13 Actemra 1/12  Antimicrobials:  None  Subjective: Cont to require 15L HFNC support.  Reports that he feels somewhat less short of breath today.  Reports improving appetite.  Denies chest pain or abdominal pain.  Assessment & Plan:  COVID Pneumonia -acute hypoxic respiratory failure Continue remdesivir and Decadron - has been dosed with Actemra - s/p plasma infusion   Recent Labs  Lab 08/27/19 2215 08/29/19 0502 08/30/19 0348  DDIMER 3.67* 3.73* 3.99*  FERRITIN 1,041* 692* 595*  CRP 8.8* 6.0* 2.8*  ALT 36 31 31  PROCALCITON 0.13  --   --     Severe COPD No active wheezing today - CT chest remarkable for severe emphysematous change   CKD stage III Creatinine improving - follow trend  Hyperkalemia Resolved  Hypothyroidism Continue usual home medical therapy  DVT prophylaxis: Lovenox Code Status: NO CODE BLUE - DNR Family Communication:  Disposition Plan: Progressive care  Consultants:  none  Objective: Blood pressure 126/82, pulse 60, temperature (!) 96.6 F (35.9 C), temperature source Axillary, resp. rate (!) 23, height 5\' 8"  (1.727 m), weight 80.5 kg, SpO2 94 %.  Intake/Output Summary (Last 24 hours) at 08/30/2019 0924 Last data filed at 08/30/2019 0300 Gross per 24 hour  Intake 215 ml  Output 300 ml  Net -85 ml   Filed Weights   08/27/19 2300  Weight: 80.5 kg    Examination: General: No acute respiratory  distress Lungs: Fine diffuse crackles  Cardiovascular: RRR Abdomen: Nontender, nondistended, soft, bowel sounds positive, no rebound Extremities: No edema bilateral lower extremities  CBC: Recent Labs  Lab 08/27/19 1910 08/27/19 1910 08/28/19 0215 08/29/19 0502 08/30/19 0348  WBC 9.1  --   --  10.9* 11.1*  NEUTROABS  --   --   --  9.1* 9.7*  HGB 17.4*   < > 15.6 15.9 15.9  HCT 52.6*   < > 46.0 48.0 48.0  MCV 90.5  --   --  89.9 90.2  PLT 260  --   --  272 261   < > = values in this interval not displayed.   Basic Metabolic Panel: Recent Labs  Lab 08/27/19 2215 08/27/19 2215 08/28/19 0215 08/29/19 0502 08/30/19 0348  NA 137   < > 138 140 141  K 5.6*   < > 3.8 4.4 4.7  CL 103  --   --  105 107  CO2 21*  --   --  24 26  GLUCOSE 134*  --   --  144* 133*  BUN 45*  --   --  49* 39*  CREATININE 1.78*  --   --  1.16 1.09  CALCIUM 9.2  --   --  9.1 9.1   < > = values in this interval not displayed.   GFR: Estimated Creatinine Clearance: 52.3 mL/min (by C-G formula based on SCr of 1.09 mg/dL).  Liver Function Tests: Recent Labs  Lab 08/27/19 2215  08/29/19 0502 08/30/19 0348  AST 74* 45* 39  ALT 36 31 31  ALKPHOS 64 54 62  BILITOT 2.1* 1.2 1.1  PROT 7.7 7.1 6.9  ALBUMIN 3.7 3.6 3.7    Coagulation Profile: Recent Labs  Lab 08/27/19 2233  INR 1.0    HbA1C: Hgb A1c MFr Bld  Date/Time Value Ref Range Status  08/29/2018 12:09 PM 5.9 4.6 - 6.5 % Final    Comment:    Glycemic Control Guidelines for People with Diabetes:Non Diabetic:  <6%Goal of Therapy: <7%Additional Action Suggested:  >8%   07/06/2017 11:52 AM 6.0 4.6 - 6.5 % Final    Comment:    Glycemic Control Guidelines for People with Diabetes:Non Diabetic:  <6%Goal of Therapy: <7%Additional Action Suggested:  >8%     CBG: No results for input(s): GLUCAP in the last 168 hours.  Recent Results (from the past 240 hour(s))  Blood Culture (routine x 2)     Status: None (Preliminary result)   Collection  Time: 08/27/19 12:17 AM   Specimen: BLOOD RIGHT FOREARM  Result Value Ref Range Status   Specimen Description BLOOD RIGHT FOREARM  Final   Special Requests   Final    BOTTLES DRAWN AEROBIC AND ANAEROBIC Blood Culture adequate volume   Culture   Final    NO GROWTH 2 DAYS Performed at [facility], [address], [place], [place] [address]    Report Status PENDING  Incomplete  Blood Culture (routine x 2)     Status: None (Preliminary result)   Collection Time: 08/27/19 10:17 PM   Specimen: BLOOD  Result Value Ref Range Status   Specimen Description BLOOD LEFT ANTECUBITAL  Final   Special Requests   Final    BOTTLES DRAWN AEROBIC AND ANAEROBIC Blood Culture adequate volume   Culture   Final    NO GROWTH 3 DAYS Performed at [facility], [place] [address] [place], [place] [address]    Report Status PENDING  Incomplete     Scheduled Meds: . dexamethasone (DECADRON) injection  6 mg Intravenous Q24H  . enoxaparin (LOVENOX) injection  40 mg Subcutaneous Q24H  . escitalopram  10 mg Oral Daily  . levothyroxine  25 mcg Oral QAC breakfast  . pantoprazole  40 mg Oral Daily  . umeclidinium bromide  1 puff Inhalation Daily   Continuous Infusions: . remdesivir 100 mg in NS 100 mL 100 mg (08/29/19 0751)     LOS: 3 days   Cherene Altes, MD [facility] Office  [phone] Pager - Text Page per Shea Evans  If 7PM-7AM, please contact night-coverage per Amion 08/30/2019, 9:24 AM

## 2019-08-31 ENCOUNTER — Ambulatory Visit: Payer: Medicare Other | Admitting: Internal Medicine

## 2019-08-31 DIAGNOSIS — Z0289 Encounter for other administrative examinations: Secondary | ICD-10-CM

## 2019-08-31 LAB — COMPREHENSIVE METABOLIC PANEL
ALT: 39 U/L (ref 0–44)
AST: 50 U/L — ABNORMAL HIGH (ref 15–41)
Albumin: 3.6 g/dL (ref 3.5–5.0)
Alkaline Phosphatase: 58 U/L (ref 38–126)
Anion gap: 6 (ref 5–15)
BUN: 38 mg/dL — ABNORMAL HIGH (ref 8–23)
CO2: 28 mmol/L (ref 22–32)
Calcium: 9.2 mg/dL (ref 8.9–10.3)
Chloride: 105 mmol/L (ref 98–111)
Creatinine, Ser: 1.06 mg/dL (ref 0.61–1.24)
GFR calc Af Amer: >60 mL/min (ref >60)
GFR calc non Af Amer: >60 mL/min (ref >60)
Glucose, Bld: 139 mg/dL — ABNORMAL HIGH (ref 70–99)
Potassium: 5.1 mmol/L (ref 3.5–5.1)
Sodium: 139 mmol/L (ref 135–145)
Total Bilirubin: 1.3 mg/dL — ABNORMAL HIGH (ref 0.3–1.2)
Total Protein: 6.7 g/dL (ref 6.5–8.1)

## 2019-08-31 LAB — CBC WITH DIFFERENTIAL/PLATELET
Abs Immature Granulocytes: 0.09 10*3/uL — ABNORMAL HIGH (ref 0.00–0.07)
Basophils Absolute: 0 10*3/uL (ref 0.0–0.1)
Basophils Relative: 0 %
Eosinophils Absolute: 0 10*3/uL (ref 0.0–0.5)
Eosinophils Relative: 0 %
HCT: 51 % (ref 39.0–52.0)
Hemoglobin: 16.9 g/dL (ref 13.0–17.0)
Immature Granulocytes: 1 %
Lymphocytes Relative: 4 %
Lymphs Abs: 0.5 10*3/uL — ABNORMAL LOW (ref 0.7–4.0)
MCH: 30 pg (ref 26.0–34.0)
MCHC: 33.1 g/dL (ref 30.0–36.0)
MCV: 90.4 fL (ref 80.0–100.0)
Monocytes Absolute: 0.8 10*3/uL (ref 0.1–1.0)
Monocytes Relative: 7 %
Neutro Abs: 9.4 10*3/uL — ABNORMAL HIGH (ref 1.7–7.7)
Neutrophils Relative %: 88 %
Platelets: 264 10*3/uL (ref 150–400)
RBC: 5.64 MIL/uL (ref 4.22–5.81)
RDW: 13.1 % (ref 11.5–15.5)
WBC: 10.7 10*3/uL — ABNORMAL HIGH (ref 4.0–10.5)
nRBC: 0 % (ref 0.0–0.2)

## 2019-08-31 LAB — D-DIMER, QUANTITATIVE: D-Dimer, Quant: 7.37 ug/mL-FEU — ABNORMAL HIGH (ref 0.00–0.50)

## 2019-08-31 LAB — FERRITIN: Ferritin: 740 ng/mL — ABNORMAL HIGH (ref 24–336)

## 2019-08-31 LAB — C-REACTIVE PROTEIN: CRP: 1.4 mg/dL — ABNORMAL HIGH (ref <1.0)

## 2019-08-31 MED ORDER — DEXAMETHASONE 6 MG PO TABS
6.0000 mg | ORAL_TABLET | Freq: Every day | ORAL | Status: AC
  Administered 2019-09-01 – 2019-09-05 (×5): 6 mg via ORAL
  Filled 2019-08-31 (×5): qty 1

## 2019-08-31 NOTE — Evaluation (Signed)
Physical Therapy Evaluation Patient Details Name: Chris Lewis MRN: [id number] DOB: [birth year] Today's Date: 08/31/2019   History of Present Illness  79 y/o male w/ hx of TIA, CVA, PNA, osteopenia, mitral valve prolapse w/ repair, R lung lesion, hypothyroidism, GERD, depression, COPD, back surgery x 3. Pt presented to the ED with shortness of breath x4 days.  In the ED his Covid test was positive and a CT angiogram noted severe emphysema with no evidence of a pulmonary embolism (1/11)  Clinical Impression   Pt admitted with above diagnosis. PTA states was living home with spouse and was independent with ADLs. Pt currently with functional limitations due to the deficits listed below (see PT Problem List). This pm pt had very poor activity tolerance, on 6L//min via HFNC, was able to tolerate sit<>stadn from recliner to adjust for urinal use and desat to 77% pt took increased time to recover from this, therapist increased 02 to 8L/min but still needed increased time to recover back to high 80s. Pt himself stated he needed to rest after finally saturations returned to high 80s, approx 6-7 mins. Reinforced use of flutter valve and also incentive spirometer. Pt will benefit from skilled PT to increase their independence and safety with mobility to allow discharge to the venue listed below.        Follow Up Recommendations Home health PT    Equipment Recommendations  Other (comment)(TBD)    Recommendations for Other Services OT consult     Precautions / Restrictions Precautions Precautions: Fall;Other (comment) Precaution Comments: 02 drops significantly w/ little effort Restrictions Weight Bearing Restrictions: No      Mobility  Bed Mobility               General bed mobility comments: pt sitting in recliner at therapist arrival  Transfers Overall transfer level: Needs assistance Equipment used: None Transfers: Sit to/from Stand Sit to Stand: Min assist         General  transfer comment: with sit to stand to adjust to be able to use urinal desat to low 70s and had great difficulty with recovery  Ambulation/Gait             General Gait Details: was not able to tolerate at this session  Stairs            Wheelchair Mobility    Modified Rankin (Stroke Patients Only)       Balance Overall balance assessment: Needs assistance Sitting-balance support: Feet supported Sitting balance-Leahy Scale: Good     Standing balance support: During functional activity Standing balance-Leahy Scale: Fair                               Pertinent Vitals/Pain Pain Assessment: Faces Faces Pain Scale: Hurts little more Pain Location: w/ desat and increased work of breathing Pain Descriptors / Indicators: Discomfort Pain Intervention(s): Limited activity within patient's tolerance;Monitored during session    Home Living Family/patient expects to be discharged to:: Private residence Living Arrangements: Spouse/significant other Available Help at Discharge: Family Type of Home: House Home Access: Stairs to enter   Technical brewer of Steps: 2 Home Layout: One level Home Equipment: Clinical cytogeneticist - 2 wheels      Prior Function Level of Independence: Independent         Comments: states lived home with spouse and many a dogs     Hand Dominance        Extremity/Trunk  Assessment   Upper Extremity Assessment Upper Extremity Assessment: Generalized weakness    Lower Extremity Assessment Lower Extremity Assessment: Generalized weakness       Communication   Communication: No difficulties  Cognition Arousal/Alertness: Awake/alert Behavior During Therapy: WFL for tasks assessed/performed Overall Cognitive Status: Within Functional Limits for tasks assessed                                        General Comments      Exercises Other Exercises Other Exercises: reinforced use of flutter valve  to increase 02 sats   Assessment/Plan    PT Assessment Patient needs continued PT services  PT Problem List Decreased strength;Decreased activity tolerance;Decreased balance;Decreased mobility;Decreased coordination;Decreased safety awareness       PT Treatment Interventions Gait training;DME instruction;Stair training;Functional mobility training;Therapeutic activities;Therapeutic exercise;Balance training;Neuromuscular re-education;Patient/family education    PT Goals (Current goals can be found in the Care Plan section)  Acute Rehab PT Goals Patient Stated Goal: to go home to family PT Goal Formulation: With patient Time For Goal Achievement: 09/14/19 Potential to Achieve Goals: Good    Frequency Min 3X/week   Barriers to discharge   lives home alone with spouse    Co-evaluation               AM-PAC PT "6 Clicks" Mobility  Outcome Measure Help needed turning from your back to your side while in a flat bed without using bedrails?: A Little Help needed moving from lying on your back to sitting on the side of a flat bed without using bedrails?: A Little Help needed moving to and from a bed to a chair (including a wheelchair)?: A Little Help needed standing up from a chair using your arms (e.g., wheelchair or bedside chair)?: A Little Help needed to walk in hospital room?: A Lot Help needed climbing 3-5 steps with a railing? : A Lot 6 Click Score: 16    End of Session Equipment Utilized During Treatment: Oxygen Activity Tolerance: Treatment limited secondary to medical complications (Comment) Patient left: in chair;with call bell/phone within reach;with chair alarm set Nurse Communication: Mobility status PT Visit Diagnosis: Other abnormalities of gait and mobility (R26.89)    Time: [id number] PT Time Calculation (min) (ACUTE ONLY): 22 min   Charges:   PT Evaluation $PT Eval Moderate Complexity: 1 Mod PT Treatments $Therapeutic Activity: 8-22 mins         Horald Chestnut, PT   Delford Field 08/31/2019, 4:43 PM

## 2019-08-31 NOTE — Progress Notes (Addendum)
Chris Lewis  [id number] DOB: 22-Dec-1940 DOA: 08/27/2019 PCP: Biagio Borg, MD    Brief Narrative:  79yo with a history of COPD, hypothyroidism, depression, and status post mitral valve repair who presented to the ED with shortness of breath x4 days.  In the ED his Covid test was positive and a CT angiogram noted severe emphysema with no evidence of a pulmonary embolism.  Significant Events: 1/11 presented to [facility] ED - COVID + 1/12 transfer to [facility]  COVID-19 specific Treatment: Decadron 1/11 > Remdesivir 1/11 > 1/15 Convalescent plasma 1/13 Actemra 1/12  Antimicrobials:  None  Subjective: Has been weaned down signif, now on 6L HFNC. Denies new complaints. Is pleasant, but mildly confused.   Assessment & Plan:  COVID Pneumonia -acute hypoxic respiratory failure Continue remdesivir and decadron - has been dosed with actemra - s/p plasma infusion - appears to be making some progress now   Recent Labs  Lab 08/27/19 2215 08/29/19 0502 08/30/19 0348 08/31/19 0254  DDIMER 3.67* 3.73* 3.99* 7.37*  FERRITIN 1,041* 692* 595* 740*  CRP 8.8* 6.0* 2.8* 1.4*  ALT 36 31 31 39  PROCALCITON 0.13  --   --   --     Severe COPD No active wheezing - CT chest remarkable for severe emphysematous change   AKI on reported CKD stage IIIa Creatinine stable in normal range   Hyperkalemia Resolved  Hypothyroidism Continue usual home medical therapy  DVT prophylaxis: Lovenox Code Status: NO CODE BLUE - DNR Family Communication:  Disposition Plan: transition to med/surg status - cont to titrate O2 as able   Consultants:  none  Objective: Blood pressure 115/84, pulse 64, temperature 97.9 F (36.6 C), temperature source Oral, resp. rate 16, height 5\' 8"  (1.727 m), weight 80.5 kg, SpO2 96 %.  Intake/Output Summary (Last 24 hours) at 08/31/2019 1714 Last data filed at 08/31/2019 1004 Gross per 24 hour  Intake 240 ml  Output 825 ml  Net -585 ml   Filed Weights   08/27/19 2300    Weight: 80.5 kg    Examination: General: No acute respiratory distress Lungs: Fine diffuse crackles  Cardiovascular: RRR w/o M  Abdomen: Nontender, nondistended, soft, bowel sounds positive Extremities: No edema bilateral lower extremities  CBC: Recent Labs  Lab 08/29/19 0502 08/30/19 0348 08/31/19 0254  WBC 10.9* 11.1* 10.7*  NEUTROABS 9.1* 9.7* 9.4*  HGB 15.9 15.9 16.9  HCT 48.0 48.0 51.0  MCV 89.9 90.2 90.4  PLT 272 261 [id number]   Basic Metabolic Panel: Recent Labs  Lab 08/29/19 0502 08/30/19 0348 08/31/19 0254  NA 140 141 139  K 4.4 4.7 5.1  CL 105 107 105  CO2 24 26 28   GLUCOSE 144* 133* 139*  BUN 49* 39* 38*  CREATININE 1.16 1.09 1.06  CALCIUM 9.1 9.1 9.2   GFR: Estimated Creatinine Clearance: 53.8 mL/min (by C-G formula based on SCr of 1.06 mg/dL).  Liver Function Tests: Recent Labs  Lab 08/27/19 2215 08/29/19 0502 08/30/19 0348 08/31/19 0254  AST 74* 45* 39 50*  ALT 36 31 31 39  ALKPHOS 64 54 62 58  BILITOT 2.1* 1.2 1.1 1.3*  PROT 7.7 7.1 6.9 6.7  ALBUMIN 3.7 3.6 3.7 3.6    Coagulation Profile: Recent Labs  Lab 08/27/19 2233  INR 1.0    HbA1C: Hgb A1c MFr Bld  Date/Time Value Ref Range Status  08/29/2018 12:09 PM 5.9 4.6 - 6.5 % Final    Comment:    Glycemic Control Guidelines for  People with Diabetes:Non Diabetic:  <6%Goal of Therapy: <7%Additional Action Suggested:  >8%   07/06/2017 11:52 AM 6.0 4.6 - 6.5 % Final    Comment:    Glycemic Control Guidelines for People with Diabetes:Non Diabetic:  <6%Goal of Therapy: <7%Additional Action Suggested:  >8%      Recent Results (from the past 240 hour(s))  Blood Culture (routine x 2)     Status: None (Preliminary result)   Collection Time: 08/27/19 12:17 AM   Specimen: BLOOD RIGHT FOREARM  Result Value Ref Range Status   Specimen Description BLOOD RIGHT FOREARM  Final   Special Requests   Final    BOTTLES DRAWN AEROBIC AND ANAEROBIC Blood Culture adequate volume   Culture   Final     NO GROWTH 2 DAYS Performed at [facility], [place] [address], [place], [place] [address]    Report Status PENDING  Incomplete  Blood Culture (routine x 2)     Status: None (Preliminary result)   Collection Time: 08/27/19 10:17 PM   Specimen: BLOOD  Result Value Ref Range Status   Specimen Description BLOOD LEFT ANTECUBITAL  Final   Special Requests   Final    BOTTLES DRAWN AEROBIC AND ANAEROBIC Blood Culture adequate volume   Culture   Final    NO GROWTH 3 DAYS Performed at [facility], [place] [address], [place], [place] [address]    Report Status PENDING  Incomplete     Scheduled Meds:  dexamethasone (DECADRON) injection  6 mg Intravenous Q24H   enoxaparin (LOVENOX) injection  40 mg Subcutaneous Q24H   escitalopram  10 mg Oral Daily   levothyroxine  25 mcg Oral QAC breakfast   pantoprazole  40 mg Oral Daily   umeclidinium bromide  1 puff Inhalation Daily       LOS: 4 days   Cherene Altes, MD [facility] Office  [phone] Pager - Text Page per Shea Evans  If 7PM-7AM, please contact night-coverage per Amion 08/31/2019, 5:14 PM

## 2019-08-31 NOTE — Progress Notes (Signed)
Patient assisted up and out of bed to bedside commode then to chair with moderate assistant. Patient AOX4, no C/O pain at this time, safety measures in place, will continue to monitor.

## 2019-09-01 LAB — BASIC METABOLIC PANEL
Anion gap: 13 (ref 5–15)
BUN: 41 mg/dL — ABNORMAL HIGH (ref 8–23)
CO2: 24 mmol/L (ref 22–32)
Calcium: 9.1 mg/dL (ref 8.9–10.3)
Chloride: 99 mmol/L (ref 98–111)
Creatinine, Ser: 1.12 mg/dL (ref 0.61–1.24)
GFR calc Af Amer: >60 mL/min (ref >60)
GFR calc non Af Amer: >60 mL/min (ref >60)
Glucose, Bld: 118 mg/dL — ABNORMAL HIGH (ref 70–99)
Potassium: 5 mmol/L (ref 3.5–5.1)
Sodium: 136 mmol/L (ref 135–145)

## 2019-09-01 LAB — D-DIMER, QUANTITATIVE: D-Dimer, Quant: 10.45 ug/mL-FEU — ABNORMAL HIGH (ref 0.00–0.50)

## 2019-09-01 LAB — CULTURE, BLOOD (ROUTINE X 2)
Culture: NO GROWTH
Special Requests: ADEQUATE

## 2019-09-01 NOTE — Progress Notes (Signed)
Chris Lewis  [id number] DOB: 03/16/1941 DOA: 08/27/2019 PCP: Biagio Borg, MD    Brief Narrative:  79yo with a history of COPD, hypothyroidism, depression, and status post mitral valve repair who presented to the ED with shortness of breath x4 days.  In the ED his Covid test was positive and a CT angiogram noted severe emphysema with no evidence of a pulmonary embolism.  Significant Events: 1/11 presented to [facility] ED - COVID + 1/12 transfer to [facility]  COVID-19 specific Treatment: Decadron 1/11 > Remdesivir 1/11 > 1/15 Convalescent plasma 1/13 Actemra 1/12  Antimicrobials:  None  Subjective: Back up to 10L HFNC as of this morning.  Recovery is proving to be protracted.  Patient is in good spirits however.  He denies chest pain nausea vomiting or abdominal pain.  Assessment & Plan:  COVID Pneumonia -acute hypoxic respiratory failure Continue decadron -has completed a course of remdesivir - has been dosed with actemra - s/p plasma infusion -continues to require too much oxygen to allow discharge home  Recent Labs  Lab 08/27/19 2215 08/29/19 0502 08/30/19 0348 08/31/19 0254 09/01/19 0133  DDIMER 3.67* 3.73* 3.99* 7.37* 10.45*  FERRITIN 1,041* 692* 595* 740*  --   CRP 8.8* 6.0* 2.8* 1.4*  --   ALT 36 31 31 39  --   PROCALCITON 0.13  --   --   --   --     Severe COPD No active wheezing - CT chest remarkable for severe emphysematous change   AKI on reported CKD stage IIIa Creatinine stable in normal range   Hyperkalemia Resolved  Hypothyroidism Continue usual home medical therapy  DVT prophylaxis: Lovenox Code Status: NO CODE BLUE - DNR Family Communication:  Disposition Plan: transition to med/surg status - cont to titrate O2 as able   Consultants:  none  Objective: Blood pressure (!) 133/92, pulse (!) 59, temperature (!) 97.3 F (36.3 C), temperature source Axillary, resp. rate 16, height 5\' 8"  (1.727 m), weight 80.5 kg, SpO2 92 %.  Intake/Output  Summary (Last 24 hours) at 09/01/2019 0955 Last data filed at 09/01/2019 0600 Gross per 24 hour  Intake 240 ml  Output 650 ml  Net -410 ml   Filed Weights   08/27/19 2300  Weight: 80.5 kg    Examination: General: No acute respiratory distress Lungs: Fine diffuse crackles  Cardiovascular: RRR w/o M  Abdomen: Nontender, nondistended, soft, bowel sounds positive Extremities: No edema bilateral lower extremities  CBC: Recent Labs  Lab 08/29/19 0502 08/30/19 0348 08/31/19 0254  WBC 10.9* 11.1* 10.7*  NEUTROABS 9.1* 9.7* 9.4*  HGB 15.9 15.9 16.9  HCT 48.0 48.0 51.0  MCV 89.9 90.2 90.4  PLT 272 261 [id number]   Basic Metabolic Panel: Recent Labs  Lab 08/30/19 0348 08/31/19 0254 09/01/19 0133  NA 141 139 136  K 4.7 5.1 5.0  CL 107 105 99  CO2 26 28 24   GLUCOSE 133* 139* 118*  BUN 39* 38* 41*  CREATININE 1.09 1.06 1.12  CALCIUM 9.1 9.2 9.1   GFR: Estimated Creatinine Clearance: 50.9 mL/min (by C-G formula based on SCr of 1.12 mg/dL).  Liver Function Tests: Recent Labs  Lab 08/27/19 2215 08/29/19 0502 08/30/19 0348 08/31/19 0254  AST 74* 45* 39 50*  ALT 36 31 31 39  ALKPHOS 64 54 62 58  BILITOT 2.1* 1.2 1.1 1.3*  PROT 7.7 7.1 6.9 6.7  ALBUMIN 3.7 3.6 3.7 3.6    Coagulation Profile: Recent Labs  Lab 08/27/19 2233  INR 1.0    HbA1C: Hgb A1c MFr Bld  Date/Time Value Ref Range Status  08/29/2018 12:09 PM 5.9 4.6 - 6.5 % Final    Comment:    Glycemic Control Guidelines for People with Diabetes:Non Diabetic:  <6%Goal of Therapy: <7%Additional Action Suggested:  >8%   07/06/2017 11:52 AM 6.0 4.6 - 6.5 % Final    Comment:    Glycemic Control Guidelines for People with Diabetes:Non Diabetic:  <6%Goal of Therapy: <7%Additional Action Suggested:  >8%      Recent Results (from the past 240 hour(s))  Blood Culture (routine x 2)     Status: None (Preliminary result)   Collection Time: 08/27/19 12:17 AM   Specimen: BLOOD RIGHT FOREARM  Result Value Ref Range  Status   Specimen Description BLOOD RIGHT FOREARM  Final   Special Requests   Final    BOTTLES DRAWN AEROBIC AND ANAEROBIC Blood Culture adequate volume   Culture   Final    NO GROWTH 4 DAYS Performed at [facility], [place] [address], [place], [place] [address]    Report Status PENDING  Incomplete  Blood Culture (routine x 2)     Status: None   Collection Time: 08/27/19 10:17 PM   Specimen: BLOOD  Result Value Ref Range Status   Specimen Description BLOOD LEFT ANTECUBITAL  Final   Special Requests   Final    BOTTLES DRAWN AEROBIC AND ANAEROBIC Blood Culture adequate volume   Culture   Final    NO GROWTH 5 DAYS Performed at [facility], [place] [address] [place], [place] [address]    Report Status 09/01/2019 FINAL  Final     Scheduled Meds: . dexamethasone  6 mg Oral Daily  . enoxaparin (LOVENOX) injection  40 mg Subcutaneous Q24H  . escitalopram  10 mg Oral Daily  . levothyroxine  25 mcg Oral QAC breakfast  . pantoprazole  40 mg Oral Daily  . umeclidinium bromide  1 puff Inhalation Daily      LOS: 5 days   Cherene Altes, MD [facility] Office  [phone] Pager - Text Page per Amion  If 7PM-7AM, please contact night-coverage per Amion 09/01/2019, 9:55 AM

## 2019-09-01 NOTE — Evaluation (Addendum)
Occupational Therapy Evaluation Patient Details Name: Chris Lewis MRN: [id number] DOB: Jan 30, 1941 Today's Date: 09/01/2019    History of Present Illness 79 y/o male w/ hx of TIA, CVA, PNA, osteopenia, mitral valve prolapse w/ repair, R lung lesion, hypothyroidism, GERD, depression, COPD, back surgery x 3. Pt presented to the ED with shortness of breath x4 days.  In the ED his Covid test was positive and a CT angiogram noted severe emphysema with no evidence of a pulmonary embolism (1/11)   Clinical Impression   This 79 y/o male presents with the above. PTA pt reports independence with ADL and functional mobility, lives with spouse. Pt lethargic this session but is agreeable to working with therapies, presenting with weakness, fatigue, and decreased respiratory status with minimal activity. Pt currently requiring minguard-minA for functional transfers using RW, minA for seated UB ADL and modA for LB ADL. Pt easily fatigued with minimal activity today and requiring increased time/effort for functional tasks. Pt standing to use urinal with minA at RW. Unable to progress mobility further as Spo2 decreased to mid 70s with static standing activity. Pt initially on 6L HFNC upon entry, with recliner legrest lowered and pt sitting upright O2 decreased to 81%, bumped to 8L HFNC and again bumped to 10-15L (15L briefly) once standing as SpO2 continued to decrease (low-mid 70s). Once returned to sitting O2 sats returned to 89% within approx 3 min given mod cues for pursed lip breathing, given additional seated rest able to return pt to 6L HFNC end of session while seated in recliner with SpO2 91%. Pt will benefit from continued acute OT services, given current functional deficits recommend SNF level therapies at time of discharge (pending pt progress) to maximize his safety and independence with ADL and mobility. Will follow.     Follow Up Recommendations  SNF;Supervision/Assistance - 24 hour(pending progress)     Equipment Recommendations  3 in 1 bedside commode;Other (comment)(to be further assessed)           Precautions / Restrictions Precautions Precautions: Fall;Other (comment) Precaution Comments: 02 drops significantly w/ little effort Restrictions Weight Bearing Restrictions: No      Mobility Bed Mobility               General bed mobility comments: pt sitting in recliner at therapist arrival  Transfers Overall transfer level: Needs assistance Equipment used: Rolling walker (2 wheeled) Transfers: Sit to/from Stand Sit to Stand: Min guard         General transfer comment: close guard for safety and balance to rise to standing at [place] for balance once upright while pt using urinal     Balance Overall balance assessment: Needs assistance Sitting-balance support: Feet supported Sitting balance-Leahy Scale: Good     Standing balance support: During functional activity Standing balance-Leahy Scale: Fair                             ADL either performed or assessed with clinical judgement   ADL Overall ADL's : Needs assistance/impaired Eating/Feeding: Set up;Sitting   Grooming: Set up;Supervision/safety;Sitting;Wash/dry face;Wash/dry hands   Upper Body Bathing: Minimal assistance;Sitting   Lower Body Bathing: Moderate assistance;Sit to/from stand   Upper Body Dressing : Minimal assistance;Sitting   Lower Body Dressing: Moderate assistance;Sit to/from stand Lower Body Dressing Details (indicate cue type and reason): pt able to bring LEs into modified figure 4 for adjusting socks, minA for standing balance     Toileting-  Clothing Manipulation and Hygiene: Moderate assistance;Sit to/from stand Toileting - Clothing Manipulation Details (indicate cue type and reason): pt standing to void bladder in urinal with minA for balance and gown management     Functional mobility during ADLs: Minimal assistance;Rolling walker(sit<>Stand only) General ADL  Comments: pt easily fatigues with minimal activity, with decreased respiratory status and DOE, overall weakness                          Pertinent Vitals/Pain Pain Assessment: Faces Faces Pain Scale: Hurts little more Pain Location: w/ desat and increased work of breathing Pain Descriptors / Indicators: Discomfort Pain Intervention(s): Limited activity within patient's tolerance;Monitored during session;Repositioned     Hand Dominance     Extremity/Trunk Assessment Upper Extremity Assessment Upper Extremity Assessment: Generalized weakness   Lower Extremity Assessment Lower Extremity Assessment: Defer to PT evaluation;Generalized weakness       Communication Communication Communication: No difficulties   Cognition Arousal/Alertness: Lethargic Behavior During Therapy: WFL for tasks assessed/performed Overall Cognitive Status: No family/caregiver present to determine baseline cognitive functioning Area of Impairment: Problem solving;Following commands                       Following Commands: Follows one step commands with increased time     Problem Solving: Slow processing General Comments: pt quite sleepy this session and easily fatigues, slow to respond at times but overall able to perform basic tasks and answer general questions   General Comments       Exercises Exercises: Other exercises Other Exercises Other Exercises: reinforced use of flutter valve    Shoulder Instructions      Home Living Family/patient expects to be discharged to:: Private residence Living Arrangements: Spouse/significant other Available Help at Discharge: Family Type of Home: House Home Access: Stairs to enter [employer] of Steps: 2   Home Layout: One level     Bathroom Shower/Tub: Tub/shower unit;Walk-in Psychologist, prison and probation services: Standard     Home Equipment: Clinical cytogeneticist - 2 wheels          Prior Functioning/Environment Level of  Independence: Independent        Comments: states lived home with spouse and many a dogs        OT Problem List: Decreased strength;Decreased range of motion;Decreased activity tolerance;Impaired balance (sitting and/or standing);Decreased safety awareness;Decreased knowledge of use of DME or AE;Cardiopulmonary status limiting activity;Decreased knowledge of precautions      OT Treatment/Interventions: Self-care/ADL training;Therapeutic exercise;Neuromuscular education;Energy conservation;DME and/or AE instruction;Therapeutic activities;Patient/family education;Balance training    OT Goals(Current goals can be found in the care plan section) Acute Rehab OT Goals Patient Stated Goal: to go home to family OT Goal Formulation: With patient Time For Goal Achievement: 09/15/19 Potential to Achieve Goals: Good ADL Goals Pt Will Perform Grooming: with supervision;sitting;standing Pt Will Perform Lower Body Bathing: with supervision;sit to/from stand;sitting/lateral leans Pt Will Perform Lower Body Dressing: with supervision;sitting/lateral leans;sit to/from stand Pt Will Transfer to Toilet: with supervision;ambulating;stand pivot transfer(ambulating vs stand pivot) Pt Will Perform Toileting - Clothing Manipulation and hygiene: with supervision;sitting/lateral leans;sit to/from stand Pt/caregiver will Perform Home Exercise Program: Increased strength;Both right and left upper extremity;With written HEP provided;Independently Additional ADL Goal #1: Pt will tolerate x2 standing ADL at supervision level with SpO2 > 90%.  OT Frequency: Min 2X/week   Barriers to D/C:            Co-evaluation  AM-PAC OT "6 Clicks" Daily Activity     Outcome Measure Help from another person eating meals?: A Little Help from another person taking care of personal grooming?: A Little Help from another person toileting, which includes using toliet, bedpan, or urinal?: A Lot Help from another  person bathing (including washing, rinsing, drying)?: A Lot Help from another person to put on and taking off regular upper body clothing?: A Little Help from another person to put on and taking off regular lower body clothing?: A Lot 6 Click Score: 15   End of Session Equipment Utilized During Treatment: Oxygen;Rolling walker Nurse Communication: Mobility status  Activity Tolerance: Patient limited by fatigue;Patient limited by lethargy Patient left: in chair;with call bell/phone within reach  OT Visit Diagnosis: Muscle weakness (generalized) (M62.81);Unsteadiness on feet (R26.81)                Time: [id number] OT Time Calculation (min): 33 min Charges:  OT General Charges $OT Visit: 1 Visit OT Evaluation $OT Eval Moderate Complexity: 1 Mod OT Treatments $Self Care/Home Management : 8-22 mins  Lou Cal, OT [facility] Pager [phone] Office [place] 09/01/2019, 1:43 PM

## 2019-09-02 LAB — CULTURE, BLOOD (ROUTINE X 2)
Culture: NO GROWTH
Special Requests: ADEQUATE

## 2019-09-02 MED ORDER — LORATADINE 10 MG PO TABS
10.0000 mg | ORAL_TABLET | Freq: Every day | ORAL | Status: DC
  Administered 2019-09-02 – 2019-09-10 (×9): 10 mg via ORAL
  Filled 2019-09-02 (×9): qty 1

## 2019-09-02 NOTE — Progress Notes (Signed)
Chris Lewis  [id number] DOB: December 07, 1940 DOA: 08/27/2019 PCP: Biagio Borg, MD    Brief Narrative:  79yo with a history of COPD, hypothyroidism, depression, and status post mitral valve repair who presented to the ED with shortness of breath x4 days.  In the ED his Covid test was positive and a CT angiogram noted severe emphysema with no evidence of a pulmonary embolism.  Significant Events: 1/11 presented to [facility] ED - COVID + 1/12 transfer to [facility]  COVID-19 specific Treatment: Decadron 1/11 > Remdesivir 1/11 > 1/15 Convalescent plasma 1/13 Actemra 1/12  Antimicrobials:  None  Subjective: Required increase in support to 15 L when working with OT yesterday.  Return to 6 L after finishing therapy.  Recovery is proving to be quite slow.  Patient is understandably becoming frustrated with being in the hospital.  He denies new complaints today otherwise.  Assessment & Plan:  COVID Pneumonia -acute hypoxic respiratory failure Continue decadron -has completed a course of remdesivir - has been dosed with actemra - s/p plasma infusion -continues to require too much oxygen to allow discharge home  Recent Labs  Lab 08/27/19 2215 08/29/19 0502 08/30/19 0348 08/31/19 0254 09/01/19 0133  DDIMER 3.67* 3.73* 3.99* 7.37* 10.45*  FERRITIN 1,041* 692* 595* 740*  --   CRP 8.8* 6.0* 2.8* 1.4*  --   ALT 36 31 31 39  --   PROCALCITON 0.13  --   --   --   --     Severe COPD No active wheezing - CT chest remarkable for severe emphysematous change   AKI on reported CKD stage IIIa Creatinine stable in normal range   Hyperkalemia Resolved  Hypothyroidism Continue usual home medical therapy  DVT prophylaxis: Lovenox Code Status: NO CODE BLUE - DNR Family Communication:  Disposition Plan: transition to med/surg status - cont to titrate O2 as able   Consultants:  none  Objective: Blood pressure 108/76, pulse 62, temperature 98.7 F (37.1 C), temperature source Oral, resp. rate  20, height 5\' 8"  (1.727 m), weight 80.5 kg, SpO2 94 %.  Intake/Output Summary (Last 24 hours) at 09/02/2019 0947 Last data filed at 09/01/2019 1100 Gross per 24 hour  Intake -  Output 100 ml  Net -100 ml   Filed Weights   08/27/19 2300  Weight: 80.5 kg    Examination: General: No acute respiratory distress Lungs: Fine diffuse crackles without change Cardiovascular: RRR  Extremities: No edema bilateral lower extremities  CBC: Recent Labs  Lab 08/29/19 0502 08/30/19 0348 08/31/19 0254  WBC 10.9* 11.1* 10.7*  NEUTROABS 9.1* 9.7* 9.4*  HGB 15.9 15.9 16.9  HCT 48.0 48.0 51.0  MCV 89.9 90.2 90.4  PLT 272 261 [id number]   Basic Metabolic Panel: Recent Labs  Lab 08/30/19 0348 08/31/19 0254 09/01/19 0133  NA 141 139 136  K 4.7 5.1 5.0  CL 107 105 99  CO2 26 28 24   GLUCOSE 133* 139* 118*  BUN 39* 38* 41*  CREATININE 1.09 1.06 1.12  CALCIUM 9.1 9.2 9.1   GFR: Estimated Creatinine Clearance: 50.9 mL/min (by C-G formula based on SCr of 1.12 mg/dL).  Liver Function Tests: Recent Labs  Lab 08/27/19 2215 08/29/19 0502 08/30/19 0348 08/31/19 0254  AST 74* 45* 39 50*  ALT 36 31 31 39  ALKPHOS 64 54 62 58  BILITOT 2.1* 1.2 1.1 1.3*  PROT 7.7 7.1 6.9 6.7  ALBUMIN 3.7 3.6 3.7 3.6    Coagulation Profile: Recent Labs  Lab 08/27/19 2233  INR 1.0    HbA1C: Hgb A1c MFr Bld  Date/Time Value Ref Range Status  08/29/2018 12:09 PM 5.9 4.6 - 6.5 % Final    Comment:    Glycemic Control Guidelines for People with Diabetes:Non Diabetic:  <6%Goal of Therapy: <7%Additional Action Suggested:  >8%   07/06/2017 11:52 AM 6.0 4.6 - 6.5 % Final    Comment:    Glycemic Control Guidelines for People with Diabetes:Non Diabetic:  <6%Goal of Therapy: <7%Additional Action Suggested:  >8%      Recent Results (from the past 240 hour(s))  Blood Culture (routine x 2)     Status: None   Collection Time: 08/27/19 12:17 AM   Specimen: BLOOD RIGHT FOREARM  Result Value Ref Range Status    Specimen Description BLOOD RIGHT FOREARM  Final   Special Requests   Final    BOTTLES DRAWN AEROBIC AND ANAEROBIC Blood Culture adequate volume   Culture   Final    NO GROWTH 5 DAYS Performed at [facility], [place] [address], [place], [place] [address]    Report Status 09/02/2019 FINAL  Final  Blood Culture (routine x 2)     Status: None   Collection Time: 08/27/19 10:17 PM   Specimen: BLOOD  Result Value Ref Range Status   Specimen Description BLOOD LEFT ANTECUBITAL  Final   Special Requests   Final    BOTTLES DRAWN AEROBIC AND ANAEROBIC Blood Culture adequate volume   Culture   Final    NO GROWTH 5 DAYS Performed at [facility], [place] [address] [place], [place] [address]    Report Status 09/01/2019 FINAL  Final     Scheduled Meds: . dexamethasone  6 mg Oral Daily  . enoxaparin (LOVENOX) injection  40 mg Subcutaneous Q24H  . escitalopram  10 mg Oral Daily  . levothyroxine  25 mcg Oral QAC breakfast  . pantoprazole  40 mg Oral Daily  . umeclidinium bromide  1 puff Inhalation Daily      LOS: 6 days   Cherene Altes, MD [facility] Office  [phone] Pager - Text Page per Amion  If 7PM-7AM, please contact night-coverage per Amion 09/02/2019, 9:47 AM

## 2019-09-03 LAB — COMPREHENSIVE METABOLIC PANEL
ALT: 45 U/L — ABNORMAL HIGH (ref 0–44)
AST: 34 U/L (ref 15–41)
Albumin: 3.6 g/dL (ref 3.5–5.0)
Alkaline Phosphatase: 58 U/L (ref 38–126)
Anion gap: 10 (ref 5–15)
BUN: 42 mg/dL — ABNORMAL HIGH (ref 8–23)
CO2: 26 mmol/L (ref 22–32)
Calcium: 8.9 mg/dL (ref 8.9–10.3)
Chloride: 102 mmol/L (ref 98–111)
Creatinine, Ser: 1.23 mg/dL (ref 0.61–1.24)
GFR calc Af Amer: >60 mL/min (ref >60)
GFR calc non Af Amer: 55 mL/min — ABNORMAL LOW (ref >60)
Glucose, Bld: 122 mg/dL — ABNORMAL HIGH (ref 70–99)
Potassium: 4.8 mmol/L (ref 3.5–5.1)
Sodium: 138 mmol/L (ref 135–145)
Total Bilirubin: 1.7 mg/dL — ABNORMAL HIGH (ref 0.3–1.2)
Total Protein: 6.5 g/dL (ref 6.5–8.1)

## 2019-09-03 LAB — CBC
HCT: 54.7 % — ABNORMAL HIGH (ref 39.0–52.0)
Hemoglobin: 18.7 g/dL — ABNORMAL HIGH (ref 13.0–17.0)
MCH: 30.7 pg (ref 26.0–34.0)
MCHC: 34.2 g/dL (ref 30.0–36.0)
MCV: 89.7 fL (ref 80.0–100.0)
Platelets: 240 10*3/uL (ref 150–400)
RBC: 6.1 MIL/uL — ABNORMAL HIGH (ref 4.22–5.81)
RDW: 13.4 % (ref 11.5–15.5)
WBC: 16 10*3/uL — ABNORMAL HIGH (ref 4.0–10.5)
nRBC: 0 % (ref 0.0–0.2)

## 2019-09-03 LAB — D-DIMER, QUANTITATIVE: D-Dimer, Quant: 8.98 ug/mL-FEU — ABNORMAL HIGH (ref 0.00–0.50)

## 2019-09-03 NOTE — Progress Notes (Signed)
Chris Lewis  [id number] DOB: 1941-08-04 DOA: 08/27/2019 PCP: Biagio Borg, MD    Brief Narrative:  79yo with a history of COPD, hypothyroidism, depression, and status post mitral valve repair who presented to the ED with shortness of breath x4 days.  In the ED his Covid test was positive and a CT angiogram noted severe emphysema with no evidence of a pulmonary embolism.  Significant Events: 1/11 presented to [facility] ED - COVID + 1/12 transfer to [facility]  COVID-19 specific Treatment: Decadron 1/11 > 1/20 Remdesivir 1/11 > 1/15 Convalescent plasma 1/13 Actemra 1/12  Antimicrobials:  None  Subjective: Continues to require high flow nasal cannula at 8 L.  This does reflect a slight decrease from 10 yesterday. No new complaints. Denies cp, n/v, or abdom pain.   Assessment & Plan:  COVID Pneumonia -acute hypoxic respiratory failure Continue decadron - has completed a course of remdesivir - has been dosed with actemra - s/p plasma infusion - continues to require too much oxygen to allow discharge home  Recent Labs  Lab 08/27/19 2215 08/27/19 2215 08/29/19 0502 08/30/19 0348 08/31/19 0254 09/01/19 0133 09/03/19 0133  DDIMER 3.67*   < > 3.73* 3.99* 7.37* 10.45* 8.98*  FERRITIN 1,041*  --  692* 595* 740*  --   --   CRP 8.8*  --  6.0* 2.8* 1.4*  --   --   ALT 36  --  31 31 39  --  45*  PROCALCITON 0.13  --   --   --   --   --   --    < > = values in this interval not displayed.    Severe COPD No active wheezing - CT chest remarkable for severe emphysematous change - this is contributing to the slow rate of his progression   AKI on reported CKD stage IIIa Creatinine stable   Hyperkalemia Resolved  Hypothyroidism Continue usual home medical therapy  DVT prophylaxis: Lovenox Code Status: NO CODE BLUE - DNR Family Communication:  Disposition Plan: med/surg status - cont to titrate O2 as able   Consultants:  none  Objective: Blood pressure 121/83, pulse 71,  temperature (!) 97.5 F (36.4 C), resp. rate 18, height 5\' 8"  (1.727 m), weight 80.5 kg, SpO2 96 %.  Intake/Output Summary (Last 24 hours) at 09/03/2019 1029 Last data filed at 09/02/2019 1700 Gross per 24 hour  Intake 480 ml  Output 300 ml  Net 180 ml   Filed Weights   08/27/19 2300  Weight: 80.5 kg    Examination: General: No acute respiratory distress Lungs: Fine diffuse crackles - no wheezing  Cardiovascular: RRR  Extremities: No edema B LE   CBC: Recent Labs  Lab 08/29/19 0502 08/29/19 0502 08/30/19 0348 08/31/19 0254 09/03/19 0133  WBC 10.9*   < > 11.1* 10.7* 16.0*  NEUTROABS 9.1*  --  9.7* 9.4*  --   HGB 15.9   < > 15.9 16.9 18.7*  HCT 48.0   < > 48.0 51.0 54.7*  MCV 89.9   < > 90.2 90.4 89.7  PLT 272   < > 261 264 240   < > = values in this interval not displayed.   Basic Metabolic Panel: Recent Labs  Lab 08/31/19 0254 09/01/19 0133 09/03/19 0133  NA 139 136 138  K 5.1 5.0 4.8  CL 105 99 102  CO2 28 24 26   GLUCOSE 139* 118* 122*  BUN 38* 41* 42*  CREATININE 1.06 1.12 1.23  CALCIUM 9.2  9.1 8.9   GFR: Estimated Creatinine Clearance: 46.3 mL/min (by C-G formula based on SCr of 1.23 mg/dL).  Liver Function Tests: Recent Labs  Lab 08/29/19 0502 08/30/19 0348 08/31/19 0254 09/03/19 0133  AST 45* 39 50* 34  ALT 31 31 39 45*  ALKPHOS 54 62 58 58  BILITOT 1.2 1.1 1.3* 1.7*  PROT 7.1 6.9 6.7 6.5  ALBUMIN 3.6 3.7 3.6 3.6    Coagulation Profile: Recent Labs  Lab 08/27/19 2233  INR 1.0    HbA1C: Hgb A1c MFr Bld  Date/Time Value Ref Range Status  08/29/2018 12:09 PM 5.9 4.6 - 6.5 % Final    Comment:    Glycemic Control Guidelines for People with Diabetes:Non Diabetic:  <6%Goal of Therapy: <7%Additional Action Suggested:  >8%   07/06/2017 11:52 AM 6.0 4.6 - 6.5 % Final    Comment:    Glycemic Control Guidelines for People with Diabetes:Non Diabetic:  <6%Goal of Therapy: <7%Additional Action Suggested:  >8%      Recent Results (from the  past 240 hour(s))  Blood Culture (routine x 2)     Status: None   Collection Time: 08/27/19 12:17 AM   Specimen: BLOOD RIGHT FOREARM  Result Value Ref Range Status   Specimen Description BLOOD RIGHT FOREARM  Final   Special Requests   Final    BOTTLES DRAWN AEROBIC AND ANAEROBIC Blood Culture adequate volume   Culture   Final    NO GROWTH 5 DAYS Performed at [facility], [address] [place], [place] [address]    Report Status 09/02/2019 FINAL  Final  Blood Culture (routine x 2)     Status: None   Collection Time: 08/27/19 10:17 PM   Specimen: BLOOD  Result Value Ref Range Status   Specimen Description BLOOD LEFT ANTECUBITAL  Final   Special Requests   Final    BOTTLES DRAWN AEROBIC AND ANAEROBIC Blood Culture adequate volume   Culture   Final    NO GROWTH 5 DAYS Performed at [facility], [place] [address], [place], [place] [address]    Report Status 09/01/2019 FINAL  Final     Scheduled Meds: . dexamethasone  6 mg Oral Daily  . enoxaparin (LOVENOX) injection  40 mg Subcutaneous Q24H  . escitalopram  10 mg Oral Daily  . levothyroxine  25 mcg Oral QAC breakfast  . loratadine  10 mg Oral Daily  . pantoprazole  40 mg Oral Daily  . umeclidinium bromide  1 puff Inhalation Daily      LOS: 7 days   Cherene Altes, MD [facility] Office  [phone] Pager - Text Page per Amion  If 7PM-7AM, please contact night-coverage per Amion 09/03/2019, 10:29 AM

## 2019-09-04 ENCOUNTER — Telehealth: Payer: Self-pay

## 2019-09-04 LAB — CBC
HCT: 56.2 % — ABNORMAL HIGH (ref 39.0–52.0)
Hemoglobin: 18.6 g/dL — ABNORMAL HIGH (ref 13.0–17.0)
MCH: 29.9 pg (ref 26.0–34.0)
MCHC: 33.1 g/dL (ref 30.0–36.0)
MCV: 90.4 fL (ref 80.0–100.0)
Platelets: 214 10*3/uL (ref 150–400)
RBC: 6.22 MIL/uL — ABNORMAL HIGH (ref 4.22–5.81)
RDW: 13.3 % (ref 11.5–15.5)
WBC: 16.2 10*3/uL — ABNORMAL HIGH (ref 4.0–10.5)
nRBC: 0 % (ref 0.0–0.2)

## 2019-09-04 LAB — BASIC METABOLIC PANEL
Anion gap: 10 (ref 5–15)
BUN: 41 mg/dL — ABNORMAL HIGH (ref 8–23)
CO2: 26 mmol/L (ref 22–32)
Calcium: 9 mg/dL (ref 8.9–10.3)
Chloride: 102 mmol/L (ref 98–111)
Creatinine, Ser: 1.15 mg/dL (ref 0.61–1.24)
GFR calc Af Amer: >60 mL/min (ref >60)
GFR calc non Af Amer: 60 mL/min — ABNORMAL LOW (ref >60)
Glucose, Bld: 102 mg/dL — ABNORMAL HIGH (ref 70–99)
Potassium: 4.9 mmol/L (ref 3.5–5.1)
Sodium: 138 mmol/L (ref 135–145)

## 2019-09-04 NOTE — Progress Notes (Signed)
Chris Lewis  [id number] DOB: 21-Sep-1940 DOA: 08/27/2019 PCP: Biagio Borg, MD    Brief Narrative:  79yo with a history of COPD, hypothyroidism, depression, and status post mitral valve repair who presented to the ED with shortness of breath x4 days.  In the ED his Covid test was positive and a CT angiogram noted severe emphysema with no evidence of a pulmonary embolism.  Significant Events: 1/11 presented to [facility] ED - COVID + 1/12 transfer to [facility]  COVID-19 specific Treatment: Decadron 1/11 > 1/20 Remdesivir 1/11 > 1/15 Convalescent plasma 1/13 Actemra 1/12  Antimicrobials:  None  Subjective: O2 requirement decreased to 6L HFNC at this time.  No new complaints.  Is anxious to go home as soon as he can but appears to understand why he must remain in the hospital for now.  Assessment & Plan:  COVID Pneumonia -acute hypoxic respiratory failure Continue decadron to complete a 10-day course - has completed a course of remdesivir - has been dosed with actemra - s/p plasma infusion - continues to require too much oxygen to allow discharge home  Recent Labs  Lab 08/29/19 0502 08/30/19 0348 08/31/19 0254 09/01/19 0133 09/03/19 0133  DDIMER 3.73* 3.99* 7.37* 10.45* 8.98*  FERRITIN 692* 595* 740*  --   --   CRP 6.0* 2.8* 1.4*  --   --   ALT 31 31 39  --  45*    Severe COPD No active wheezing - CT chest remarkable for severe emphysematous change - this is contributing to the slow rate of his progression   AKI on reported CKD stage IIIa Creatinine stable   Hyperkalemia Resolved  Hypothyroidism Continue usual home medical therapy  DVT prophylaxis: Lovenox Code Status: NO CODE BLUE - DNR Family Communication:  Disposition Plan: med/surg status - cont to titrate O2 as able   Consultants:  none  Objective: Blood pressure 107/82, pulse 66, temperature (!) 97.5 F (36.4 C), temperature source Oral, resp. rate 16, height 5\' 8"  (1.727 m), weight 80.5 kg, SpO2 98 %.   Intake/Output Summary (Last 24 hours) at 09/04/2019 1030 Last data filed at 09/04/2019 0200 Gross per 24 hour  Intake 360 ml  Output 1100 ml  Net -740 ml   Filed Weights   08/27/19 2300  Weight: 80.5 kg    Examination: General: No acute respiratory distress Lungs: Fine diffuse crackles w/o change  Cardiovascular: RRR  Extremities: No edema B LE   CBC: Recent Labs  Lab 08/29/19 0502 08/29/19 0502 08/30/19 0348 08/30/19 0348 08/31/19 0254 09/03/19 0133 09/04/19 0556  WBC 10.9*   < > 11.1*   < > 10.7* 16.0* 16.2*  NEUTROABS 9.1*  --  9.7*  --  9.4*  --   --   HGB 15.9   < > 15.9   < > 16.9 18.7* 18.6*  HCT 48.0   < > 48.0   < > 51.0 54.7* 56.2*  MCV 89.9   < > 90.2   < > 90.4 89.7 90.4  PLT 272   < > 261   < > 264 240 214   < > = values in this interval not displayed.   Basic Metabolic Panel: Recent Labs  Lab 09/01/19 0133 09/03/19 0133 09/04/19 0556  NA 136 138 138  K 5.0 4.8 4.9  CL 99 102 102  CO2 24 26 26   GLUCOSE 118* 122* 102*  BUN 41* 42* 41*  CREATININE 1.12 1.23 1.15  CALCIUM 9.1 8.9 9.0  GFR: Estimated Creatinine Clearance: 49.6 mL/min (by C-G formula based on SCr of 1.15 mg/dL).  Liver Function Tests: Recent Labs  Lab 08/29/19 0502 08/30/19 0348 08/31/19 0254 09/03/19 0133  AST 45* 39 50* 34  ALT 31 31 39 45*  ALKPHOS 54 62 58 58  BILITOT 1.2 1.1 1.3* 1.7*  PROT 7.1 6.9 6.7 6.5  ALBUMIN 3.6 3.7 3.6 3.6    HbA1C: Hgb A1c MFr Bld  Date/Time Value Ref Range Status  08/29/2018 12:09 PM 5.9 4.6 - 6.5 % Final    Comment:    Glycemic Control Guidelines for People with Diabetes:Non Diabetic:  <6%Goal of Therapy: <7%Additional Action Suggested:  >8%   07/06/2017 11:52 AM 6.0 4.6 - 6.5 % Final    Comment:    Glycemic Control Guidelines for People with Diabetes:Non Diabetic:  <6%Goal of Therapy: <7%Additional Action Suggested:  >8%      Recent Results (from the past 240 hour(s))  Blood Culture (routine x 2)     Status: None   Collection  Time: 08/27/19 12:17 AM   Specimen: BLOOD RIGHT FOREARM  Result Value Ref Range Status   Specimen Description BLOOD RIGHT FOREARM  Final   Special Requests   Final    BOTTLES DRAWN AEROBIC AND ANAEROBIC Blood Culture adequate volume   Culture   Final    NO GROWTH 5 DAYS Performed at [facility], [address], [place], [place] [address]    Report Status 09/02/2019 FINAL  Final  Blood Culture (routine x 2)     Status: None   Collection Time: 08/27/19 10:17 PM   Specimen: BLOOD  Result Value Ref Range Status   Specimen Description BLOOD LEFT ANTECUBITAL  Final   Special Requests   Final    BOTTLES DRAWN AEROBIC AND ANAEROBIC Blood Culture adequate volume   Culture   Final    NO GROWTH 5 DAYS Performed at [facility], [place] [address] [place], [place] [address]    Report Status 09/01/2019 FINAL  Final     Scheduled Meds: . dexamethasone  6 mg Oral Daily  . enoxaparin (LOVENOX) injection  40 mg Subcutaneous Q24H  . escitalopram  10 mg Oral Daily  . levothyroxine  25 mcg Oral QAC breakfast  . loratadine  10 mg Oral Daily  . pantoprazole  40 mg Oral Daily  . umeclidinium bromide  1 puff Inhalation Daily      LOS: 8 days   Cherene Altes, MD [facility] Office  [phone] Pager - Text Page per Amion  If 7PM-7AM, please contact night-coverage per Amion 09/04/2019, 10:30 AM

## 2019-09-04 NOTE — Plan of Care (Signed)
  Problem: Education: Goal: Knowledge of General Education information will improve Description: Including pain rating scale, medication(s)/side effects and non-pharmacologic comfort measures Outcome: Progressing   Problem: Health Behavior/Discharge Planning: Goal: Ability to manage health-related needs will improve Outcome: Progressing   Problem: Clinical Measurements: Goal: Ability to maintain clinical measurements within normal limits will improve Outcome: Progressing Goal: Will remain free from infection Outcome: Progressing Goal: Diagnostic test results will improve Outcome: Progressing Goal: Respiratory complications will improve Outcome: Progressing Goal: Cardiovascular complication will be avoided Outcome: Progressing   Problem: Activity: Goal: Risk for activity intolerance will decrease Outcome: Progressing   Problem: Coping: Goal: Level of anxiety will decrease Outcome: Progressing   Problem: Safety: Goal: Ability to remain free from injury will improve Outcome: Progressing   Problem: Skin Integrity: Goal: Risk for impaired skin integrity will decrease Outcome: Progressing   

## 2019-09-04 NOTE — Progress Notes (Signed)
Pt stable with no signs of distress. Report given to oncoming nurse. Safety maintained.  

## 2019-09-04 NOTE — Telephone Encounter (Unsigned)
Copied from [place] [id number]. Topic: General - Other >> Sep 04, 2019  2:42 PM Leward Quan A wrote: Reason for CRM: Pateint daughter Butch Penny called in reference to the call that came in for him. She want to inform Dr Jenny Reichmann that patient is in the Hospital delirious with covid have been in the hospital since 08/27/19. She can be reached at Ph# [phone] if there are any questions or concerns.

## 2019-09-04 NOTE — Progress Notes (Signed)
Physical Therapy Treatment Patient Details Name: Chris Lewis MRN: [id number] DOB: 08-31-1940 Today's Date: 09/04/2019    History of Present Illness 79 y/o male w/ hx of TIA, CVA, PNA, osteopenia, mitral valve prolapse w/ repair, R lung lesion, hypothyroidism, GERD, depression, COPD, back surgery x 3. Pt presented to the ED with shortness of breath x4 days.  In the ED his Covid test was positive and a CT angiogram noted severe emphysema with no evidence of a pulmonary embolism (1/11)    PT Comments    Pt needs increased encouragement to get going but once does, does much better than expected. Pt needing min guard to stand by assist with bed mob and tranfers from bed to recliner. Pt was on 15L/min via HFNC this pm and was noted to desat briefly to 84% with transfer. Pt needed cues and encouragement to complete incentive spirometer and also flutter valve while up.     Follow Up Recommendations  Home health PT     Equipment Recommendations       Recommendations for Other Services       Precautions / Restrictions Precautions Precautions: Fall Restrictions Weight Bearing Restrictions: No    Mobility  Bed Mobility Overal bed mobility: Needs Assistance Bed Mobility: Supine to Sit     Supine to sit: Supervision;Min guard        Transfers Overall transfer level: Needs assistance Equipment used: None Transfers: Sit to/from Stand;Stand Pivot Transfers Sit to Stand: Min guard Stand pivot transfers: Min guard          Ambulation/Gait             General Gait Details: did not attempt ambulation at this time   Stairs             Wheelchair Mobility    Modified Rankin (Stroke Patients Only)       Balance Overall balance assessment: Needs assistance Sitting-balance support: Feet supported Sitting balance-Leahy Scale: Good     Standing balance support: During functional activity Standing balance-Leahy Scale: Fair                              Cognition Arousal/Alertness: Awake/alert Behavior During Therapy: Anxious Overall Cognitive Status: No family/caregiver present to determine baseline cognitive functioning                                 General Comments: needs encouragement to get going but once going does much better      Exercises Other Exercises Other Exercises: incentive spirometer x 10 Other Exercises: flutter valve x 10    General Comments        Pertinent Vitals/Pain Pain Assessment: No/denies pain    Home Living                      Prior Function            PT Goals (current goals can now be found in the care plan section) Acute Rehab PT Goals Patient Stated Goal: arrived in room to find pt in bed states he has worn himself out from eating PT Goal Formulation: With patient Time For Goal Achievement: 09/14/19 Potential to Achieve Goals: Good Progress towards PT goals: Progressing toward goals    Frequency    Min 3X/week      PT Plan Current plan remains appropriate  Co-evaluation              AM-PAC PT "6 Clicks" Mobility   Outcome Measure  Help needed turning from your back to your side while in a flat bed without using bedrails?: A Little Help needed moving from lying on your back to sitting on the side of a flat bed without using bedrails?: A Little Help needed moving to and from a bed to a chair (including a wheelchair)?: A Little Help needed standing up from a chair using your arms (e.g., wheelchair or bedside chair)?: A Little Help needed to walk in hospital room?: A Lot Help needed climbing 3-5 steps with a railing? : A Lot 6 Click Score: 16    End of Session Equipment Utilized During Treatment: Oxygen Activity Tolerance: Treatment limited secondary to medical complications (Comment);Patient limited by fatigue Patient left: in chair;with call bell/phone within reach Nurse Communication: Mobility status PT Visit Diagnosis: Other  abnormalities of gait and mobility (R26.89)     Time: [id number] PT Time Calculation (min) (ACUTE ONLY): 16 min  Charges:  $Therapeutic Activity: 8-22 mins                     Horald Chestnut, PT    Delford Field 09/04/2019, 4:15 PM

## 2019-09-05 DIAGNOSIS — J9811 Atelectasis: Secondary | ICD-10-CM | POA: Diagnosis not present

## 2019-09-05 DIAGNOSIS — J449 Chronic obstructive pulmonary disease, unspecified: Secondary | ICD-10-CM | POA: Diagnosis not present

## 2019-09-05 DIAGNOSIS — J1282 Pneumonia due to coronavirus disease 2019: Secondary | ICD-10-CM | POA: Diagnosis not present

## 2019-09-05 DIAGNOSIS — I959 Hypotension, unspecified: Secondary | ICD-10-CM | POA: Diagnosis not present

## 2019-09-05 DIAGNOSIS — U071 COVID-19: Secondary | ICD-10-CM | POA: Diagnosis not present

## 2019-09-05 DIAGNOSIS — J9601 Acute respiratory failure with hypoxia: Secondary | ICD-10-CM

## 2019-09-05 DIAGNOSIS — J9600 Acute respiratory failure, unspecified whether with hypoxia or hypercapnia: Secondary | ICD-10-CM | POA: Diagnosis not present

## 2019-09-05 MED ORDER — FUROSEMIDE 10 MG/ML IJ SOLN
40.0000 mg | Freq: Once | INTRAMUSCULAR | Status: AC
  Administered 2019-09-05: 17:00:00 40 mg via INTRAVENOUS
  Filled 2019-09-05: qty 4

## 2019-09-05 NOTE — Progress Notes (Signed)
Pt remained stable throughout the night with no signs of distress. Safety maintained.

## 2019-09-05 NOTE — Plan of Care (Signed)
   Vital Signs MEWS/VS Documentation       09/05/2019 0844 09/05/2019 1235 09/05/2019 1703 09/05/2019 2000   MEWS Score:  0  --  0  1   MEWS Score Color:  Green  --  Ailene Ards   Resp:  --  --  --  20   Pulse:  60  --  91  --   BP:  --  --  102/73  94/70   Temp:  97.7 F (36.5 C)  --  97.8 F (36.6 C)  97.6 F (36.4 C)   O2 Device:  HFNC  HFNC  HFNC  HFNC   O2 Flow Rate (L/min):  6 L/min  6 L/min  5 L/min  5 L/min   Level of Consciousness:  Alert  --  Alert  --         POC reviewed with pt.  Pt up in chair.   Paticia Stack Jd Mccaster 09/05/2019,11:46 PM

## 2019-09-05 NOTE — Plan of Care (Signed)

## 2019-09-05 NOTE — Progress Notes (Addendum)
Pt A&Ox4 pleasant and co-op with care, denies pain or discomfort. Cont on HFNC titated to 5L Sats 97%, pt will desat if not breathing effectively thru his nares. Educated pt on breathing an alarms on the monitor. Will cont to monitor.

## 2019-09-05 NOTE — Progress Notes (Signed)
Chris Lewis  [id number] DOB: 01/17/1941 DOA: 08/27/2019 PCP: Biagio Borg, MD    Brief Narrative:  79yo with a history of COPD, hypothyroidism, depression, and status post mitral valve repair who presented to the ED with shortness of breath x4 days.  In the ED his Covid test was positive and a CT angiogram noted severe emphysema with no evidence of a pulmonary embolism.  Significant Events: 1/11 presented to [facility] ED - COVID + 1/12 transfer to [facility]  COVID-19 specific Treatment: Decadron 1/11 > 1/20 Remdesivir 1/11 > 1/15 Convalescent plasma 1/13 Actemra 1/12  Antimicrobials:  None  Subjective: Patient states that he is feeling slightly better today compared to yesterday.  Not as short of breath.  He does have cough with yellowish expectoration.  No chest pain.  No nausea vomiting.     Assessment & Plan:  Pneumonia due to COVID-19/acute respiratory failure with hypoxia Patient has completed course of remdesivir and steroids.  He also received Actemra and convalescent plasma.  Over the last 24 to 36 hours his oxygen requirements have improved from 10 L to 5 L.  He saturating in the early 90s.  His D-dimer improved.  CRP had improved to 1.4.  Continue incentive spirometry, mobilization, out of bed to chair.  PT and OT evaluation.  Home health is recommended.  Repeat chest x-ray tomorrow.  Recent Labs  Lab 08/30/19 0348 08/31/19 0254 09/01/19 0133 09/03/19 0133  DDIMER 3.99* 7.37* 10.45* 8.98*  FERRITIN 595* 740*  --   --   CRP 2.8* 1.4*  --   --   ALT 31 39  --  45*    Severe COPD No active wheezing - CT chest remarkable for severe emphysematous change - this is contributing to the slow rate of his progression   AKI on CKD stage IIIa Renal function is stable.  Continue to monitor urine output.    Hyperkalemia Resolved  Hypothyroidism Continue usual home medical therapy  DVT prophylaxis: Lovenox Code Status: DNR Family Communication: We will update family  member later today Disposition Plan: Continue to wean down oxygen.  Mobilize.  Consultants:  none  Objective: Blood pressure 112/80, pulse 60, temperature 97.7 F (36.5 C), temperature source Oral, resp. rate 18, height 5\' 8"  (1.727 m), weight 80.5 kg, SpO2 96 %.  Intake/Output Summary (Last 24 hours) at 09/05/2019 1553 Last data filed at 09/05/2019 1518 Gross per 24 hour  Intake 120 ml  Output 1420 ml  Net -1300 ml   Filed Weights   08/27/19 2300  Weight: 80.5 kg    Examination:  General appearance: Awake alert.  In no distress Resp: Few crackles at the bases bilaterally.  Normal effort at rest.  No wheezing or rhonchi. Cardio: S1-S2 is normal regular.  No S3-S4.  No rubs murmurs or bruit GI: Abdomen is soft.  Nontender nondistended.  Bowel sounds are present normal.  No masses organomegaly Extremities: No edema.  Full range of motion of lower extremities. Neurologic: Alert and oriented x3.  No focal neurological deficits.    CBC: Recent Labs  Lab 08/30/19 0348 08/30/19 0348 08/31/19 0254 09/03/19 0133 09/04/19 0556  WBC 11.1*   < > 10.7* 16.0* 16.2*  NEUTROABS 9.7*  --  9.4*  --   --   HGB 15.9   < > 16.9 18.7* 18.6*  HCT 48.0   < > 51.0 54.7* 56.2*  MCV 90.2   < > 90.4 89.7 90.4  PLT 261   < > 264  240 214   < > = values in this interval not displayed.   Basic Metabolic Panel: Recent Labs  Lab 09/01/19 0133 09/03/19 0133 09/04/19 0556  NA 136 138 138  K 5.0 4.8 4.9  CL 99 102 102  CO2 24 26 26   GLUCOSE 118* 122* 102*  BUN 41* 42* 41*  CREATININE 1.12 1.23 1.15  CALCIUM 9.1 8.9 9.0   GFR: Estimated Creatinine Clearance: 49.6 mL/min (by C-G formula based on SCr of 1.15 mg/dL).  Liver Function Tests: Recent Labs  Lab 08/30/19 0348 08/31/19 0254 09/03/19 0133  AST 39 50* 34  ALT 31 39 45*  ALKPHOS 62 58 58  BILITOT 1.1 1.3* 1.7*  PROT 6.9 6.7 6.5  ALBUMIN 3.7 3.6 3.6    HbA1C: Hgb A1c MFr Bld  Date/Time Value Ref Range Status  08/29/2018  12:09 PM 5.9 4.6 - 6.5 % Final    Comment:    Glycemic Control Guidelines for People with Diabetes:Non Diabetic:  <6%Goal of Therapy: <7%Additional Action Suggested:  >8%   07/06/2017 11:52 AM 6.0 4.6 - 6.5 % Final    Comment:    Glycemic Control Guidelines for People with Diabetes:Non Diabetic:  <6%Goal of Therapy: <7%Additional Action Suggested:  >8%      Recent Results (from the past 240 hour(s))  Blood Culture (routine x 2)     Status: None   Collection Time: 08/27/19 12:17 AM   Specimen: BLOOD RIGHT FOREARM  Result Value Ref Range Status   Specimen Description BLOOD RIGHT FOREARM  Final   Special Requests   Final    BOTTLES DRAWN AEROBIC AND ANAEROBIC Blood Culture adequate volume   Culture   Final    NO GROWTH 5 DAYS Performed at [facility], [address] [place], [place] [address]    Report Status 09/02/2019 FINAL  Final  Blood Culture (routine x 2)     Status: None   Collection Time: 08/27/19 10:17 PM   Specimen: BLOOD  Result Value Ref Range Status   Specimen Description BLOOD LEFT ANTECUBITAL  Final   Special Requests   Final    BOTTLES DRAWN AEROBIC AND ANAEROBIC Blood Culture adequate volume   Culture   Final    NO GROWTH 5 DAYS Performed at [facility], [place] [address], [place], [place] [address]    Report Status 09/01/2019 FINAL  Final      Radiology  No results found.   Scheduled Meds: . enoxaparin (LOVENOX) injection  40 mg Subcutaneous Q24H  . escitalopram  10 mg Oral Daily  . levothyroxine  25 mcg Oral QAC breakfast  . loratadine  10 mg Oral Daily  . pantoprazole  40 mg Oral Daily  . umeclidinium bromide  1 puff Inhalation Daily      LOS: 9 days   [facility] Office  [phone] Pager - Text Page per Shea Evans  If 7PM-7AM, please contact night-coverage per Amion 09/05/2019, 3:53 PM

## 2019-09-06 ENCOUNTER — Inpatient Hospital Stay (HOSPITAL_COMMUNITY): Payer: BC Managed Care – PPO

## 2019-09-06 LAB — COMPREHENSIVE METABOLIC PANEL
ALT: 30 U/L (ref 0–44)
AST: 28 U/L (ref 15–41)
Albumin: 3.8 g/dL (ref 3.5–5.0)
Alkaline Phosphatase: 66 U/L (ref 38–126)
Anion gap: 11 (ref 5–15)
BUN: 46 mg/dL — ABNORMAL HIGH (ref 8–23)
CO2: 28 mmol/L (ref 22–32)
Calcium: 8.9 mg/dL (ref 8.9–10.3)
Chloride: 96 mmol/L — ABNORMAL LOW (ref 98–111)
Creatinine, Ser: 1.26 mg/dL — ABNORMAL HIGH (ref 0.61–1.24)
GFR calc Af Amer: >60 mL/min (ref >60)
GFR calc non Af Amer: 54 mL/min — ABNORMAL LOW (ref >60)
Glucose, Bld: 98 mg/dL (ref 70–99)
Potassium: 4 mmol/L (ref 3.5–5.1)
Sodium: 135 mmol/L (ref 135–145)
Total Bilirubin: 1.6 mg/dL — ABNORMAL HIGH (ref 0.3–1.2)
Total Protein: 6.5 g/dL (ref 6.5–8.1)

## 2019-09-06 LAB — CBC
HCT: 54.3 % — ABNORMAL HIGH (ref 39.0–52.0)
Hemoglobin: 18.7 g/dL — ABNORMAL HIGH (ref 13.0–17.0)
MCH: 31.1 pg (ref 26.0–34.0)
MCHC: 34.4 g/dL (ref 30.0–36.0)
MCV: 90.2 fL (ref 80.0–100.0)
Platelets: 112 10*3/uL — ABNORMAL LOW (ref 150–400)
RBC: 6.02 MIL/uL — ABNORMAL HIGH (ref 4.22–5.81)
RDW: 13.6 % (ref 11.5–15.5)
WBC: 18.4 10*3/uL — ABNORMAL HIGH (ref 4.0–10.5)
nRBC: 0 % (ref 0.0–0.2)

## 2019-09-06 NOTE — Evaluation (Signed)
Occupational Therapy Treatment Patient Details Name: Chris Lewis MRN: [id number] DOB: September 30, 1940 Today's Date: 09/06/2019    History of present illness 79 y/o male w/ hx of TIA, CVA, PNA, osteopenia, mitral valve prolapse w/ repair, R lung lesion, hypothyroidism, GERD, depression, COPD, back surgery x 3. Pt presented to the ED with shortness of breath x4 days.  In the ED his Covid test was positive and a CT angiogram noted severe emphysema with no evidence of a pulmonary embolism (1/11)   OT comments  Pt progressing towards established OT goals. Pt performing functional mobility to bathroom for toileting with Min Guard A, RW, and 2L O2. Pt SpO2 dropping to 79% on 2L and pt requiring seated rest break, cues for purse lip breathing, and 8L O2 via HFNC to recover to 90s. Pt performing toileting with Min Guard A for safety and cues for purse lip breathing. Pt able to recover in recliner and decrease O2 to 4L on [place]; notified RN. Update dc recommend dc to home with HHOT pending his progress. Will continue to follow acutely as admitted.   Follow Up Recommendations  Supervision/Assistance - 24 hour;Home health OT;SNF(Pending his progress)    Equipment Recommendations  3 in 1 bedside commode    Recommendations for Other Services      Precautions / Restrictions Precautions Precautions: Fall Precaution Comments: 02 drops significantly w/ little effort Restrictions Weight Bearing Restrictions: No       Mobility Bed Mobility               General bed mobility comments: Pt sitting in recliner upon arrival  Transfers Overall transfer level: Needs assistance Equipment used: None Transfers: Sit to/from Stand;Stand Pivot Transfers Sit to Stand: Min guard         General transfer comment: Min Guard A for safety in standing    Balance Overall balance assessment: Needs assistance Sitting-balance support: Feet supported;No upper extremity supported Sitting balance-Leahy Scale: Good     Standing balance support: During functional activity;Bilateral upper extremity supported;No upper extremity supported Standing balance-Leahy Scale: Fair                             ADL either performed or assessed with clinical judgement   ADL Overall ADL's : Needs assistance/impaired     Grooming: Set up;Supervision/safety;Sitting;Wash/dry hands Grooming Details (indicate cue type and reason): Pt washing his hands with hand sanitizer while seated             Lower Body Dressing: Minimal assistance;Sit to/from stand Lower Body Dressing Details (indicate cue type and reason): Pt adjusting his socks while seated. Min A for safety while standing Toilet Transfer: Ambulation;BSC;RW;Min guard(BSC over toilet) Toilet Transfer Details (indicate cue type and reason): Min A for safety during descent Toileting- Water quality scientist and Hygiene: Min guard;Sit to/from stand;Sitting/lateral lean Toileting - Clothing Manipulation Details (indicate cue type and reason): Min Guard A for safety while pt adjusted underwear in standing. Pt performing peri care while seated and laterally leaning     Functional mobility during ADLs: Minimal assistance;Rolling walker;Min guard General ADL Comments: Pt presenting with decreased activity tolerance as seen by fatigue and poor SpO2     Vision       Perception     Praxis      Cognition Arousal/Alertness: Awake/alert Behavior During Therapy: WFL for tasks assessed/performed Overall Cognitive Status: No family/caregiver present to determine baseline cognitive functioning Area of Impairment: Problem solving;Following commands  Following Commands: Follows one step commands with increased time     Problem Solving: Slow processing General Comments: Pt agreeable to therapy. Requiring increased time and cues during session. Cues for calm easy breathing with decreased SpO2        Exercises     Shoulder  Instructions       General Comments At rest, pt SpO2 in 90s on 2L. SpO2 dropping to 79% on 2L during activity. Pt requiring significant time, seated rest break, and 8L O2 via HFNC to recover to 90s. Cues for purse lip breathing. Leaving pt on 4L O2  via [place] at end of session; RN notified     Pertinent Vitals/ Pain       Pain Assessment: No/denies pain  Home Living                                          Prior Functioning/Environment              Frequency  Min 2X/week        Progress Toward Goals  OT Goals(current goals can now be found in the care plan section)  Progress towards OT goals: Progressing toward goals  Acute Rehab OT Goals Patient Stated Goal: arrived in room to find pt in bed states he has worn himself out from eating OT Goal Formulation: With patient Time For Goal Achievement: 09/15/19 Potential to Achieve Goals: Good ADL Goals Pt Will Perform Grooming: with supervision;sitting;standing Pt Will Perform Lower Body Bathing: with supervision;sit to/from stand;sitting/lateral leans Pt Will Perform Lower Body Dressing: with supervision;sitting/lateral leans;sit to/from stand Pt Will Transfer to Toilet: with supervision;ambulating;stand pivot transfer(ambulating vs stand pivot) Pt Will Perform Toileting - Clothing Manipulation and hygiene: with supervision;sitting/lateral leans;sit to/from stand Pt/caregiver will Perform Home Exercise Program: Increased strength;Both right and left upper extremity;With written HEP provided;Independently Additional ADL Goal #1: Pt will tolerate x2 standing ADL at supervision level with SpO2 > 90%.  Plan Discharge plan needs to be updated    Co-evaluation                 AM-PAC OT "6 Clicks" Daily Activity     Outcome Measure   Help from another person eating meals?: A Little Help from another person taking care of personal grooming?: A Little Help from another person toileting, which includes using  toliet, bedpan, or urinal?: A Little Help from another person bathing (including washing, rinsing, drying)?: A Lot Help from another person to put on and taking off regular upper body clothing?: A Little Help from another person to put on and taking off regular lower body clothing?: A Little 6 Click Score: 17    End of Session Equipment Utilized During Treatment: Oxygen;Rolling walker(2-8L)  OT Visit Diagnosis: Muscle weakness (generalized) (M62.81);Unsteadiness on feet (R26.81)   Activity Tolerance Patient limited by fatigue   Patient Left in chair;with call bell/phone within reach   Nurse Communication Mobility status        Time: [id number] OT Time Calculation (min): 36 min  Charges: OT General Charges $OT Visit: 1 Visit OT Treatments $Self Care/Home Management : 23-37 mins  [place], OTR/L Acute Rehab Pager: [phone] Office: [place] 09/06/2019, 5:10 PM

## 2019-09-06 NOTE — Progress Notes (Signed)
Chris Lewis  [id number] DOB: 11/10/1940 DOA: 08/27/2019 PCP: Biagio Borg, MD    Brief Narrative:  79yo with a history of COPD, hypothyroidism, depression, and status post mitral valve repair who presented to the ED with shortness of breath x4 days.  In the ED his Covid test was positive and a CT angiogram noted severe emphysema with no evidence of a pulmonary embolism.  Significant Events: 1/11 presented to [facility] ED - COVID + 1/12 transfer to [facility]  COVID-19 specific Treatment: Decadron 1/11 > 1/20 Remdesivir 1/11 > 1/15 Convalescent plasma 1/13 Actemra 1/12  Antimicrobials:  Anti-infectives (From admission, onward)   Start     Dose/Rate Route Frequency Ordered Stop   08/28/19 1000  remdesivir 100 mg in sodium chloride 0.9 % 100 mL IVPB     100 mg 200 mL/hr over 30 Minutes Intravenous Daily 08/27/19 2215 08/31/19 0949   08/27/19 2230  remdesivir 200 mg in sodium chloride 0.9% 250 mL IVPB     200 mg 580 mL/hr over 30 Minutes Intravenous Once 08/27/19 2215 08/28/19 0029       Subjective: Patient states that he continues to improve.  Not as short of breath as before.  Occasional cough with yellowish expectoration.  No chest pain.  No nausea vomiting.      Assessment & Plan:  Pneumonia due to COVID-19/acute respiratory failure with hypoxia Patient has completed course of remdesivir and steroids.  He also received Actemra and convalescent plasma. Patient's oxygen requirements have improved in the last 2 to 3 days.  He is now down to 3 L of oxygen by nasal cannula.  He saturating in the early 90s.  His D-dimer has improved.  Inflammatory markers had previously improved.  Continue with incentive spirometry, mobilization, prone positioning as tolerated.  Seen by PT and OT.  Home health is recommended.  Chest x-ray shows bibasilar atelectasis.  Hold off on Lasix today due to slight increase and BUN and creatinine.  Leukocytosis likely due to steroids.  He is afebrile.  Recent Labs   Lab 08/31/19 0254 09/01/19 0133 09/03/19 0133 09/06/19 0820  DDIMER 7.37* 10.45* 8.98*  --   FERRITIN 740*  --   --   --   CRP 1.4*  --   --   --   ALT 39  --  45* 30    Severe COPD No active wheezing - CT chest remarkable for severe emphysematous change - this is contributing to the slow rate of his progression   AKI on CKD stage IIIa Stable renal function.  Monitor urine output.  Increase in creatinine likely due to furosemide.  This will be held today.  Hyperkalemia Resolved  Hypothyroidism Continue usual home medical therapy  DVT prophylaxis: Lovenox Code Status: DNR Family Communication: Update family members. Disposition Plan: Anticipate discharge tomorrow.  Home oxygen will be ordered.  Consultants:  none  Objective: Blood pressure 95/66, pulse 77, temperature 97.7 F (36.5 C), temperature source Oral, resp. rate 16, height 5\' 8"  (1.727 m), weight 80.5 kg, SpO2 98 %.  Intake/Output Summary (Last 24 hours) at 09/06/2019 1200 Last data filed at 09/06/2019 0751 Gross per 24 hour  Intake 980 ml  Output 2270 ml  Net -1290 ml   Filed Weights   08/27/19 2300  Weight: 80.5 kg    Examination:  General appearance: Awake alert.  In no distress Resp: Normal effort at rest.  Crackles at the bases bilaterally.  No wheezing or rhonchi. Cardio: S1-S2 is normal regular.  No S3-S4.  No rubs murmurs or bruit GI: Abdomen is soft.  Nontender nondistended.  Bowel sounds are present normal.  No masses organomegaly Extremities: No edema.  Full range of motion of lower extremities. Neurologic: Alert and oriented x3.  No focal neurological deficits.    CBC: Recent Labs  Lab 08/31/19 0254 08/31/19 0254 09/03/19 0133 09/04/19 0556 09/06/19 0800  WBC 10.7*   < > 16.0* 16.2* 18.4*  NEUTROABS 9.4*  --   --   --   --   HGB 16.9   < > 18.7* 18.6* 18.7*  HCT 51.0   < > 54.7* 56.2* 54.3*  MCV 90.4   < > 89.7 90.4 90.2  PLT 264   < > 240 214 112*   < > = values in this  interval not displayed.   Basic Metabolic Panel: Recent Labs  Lab 09/03/19 0133 09/04/19 0556 09/06/19 0820  NA 138 138 135  K 4.8 4.9 4.0  CL 102 102 96*  CO2 26 26 28   GLUCOSE 122* 102* 98  BUN 42* 41* 46*  CREATININE 1.23 1.15 1.26*  CALCIUM 8.9 9.0 8.9   GFR: Estimated Creatinine Clearance: 45.2 mL/min (A) (by C-G formula based on SCr of 1.26 mg/dL (H)).  Liver Function Tests: Recent Labs  Lab 08/31/19 0254 09/03/19 0133 09/06/19 0820  AST 50* 34 28  ALT 39 45* 30  ALKPHOS 58 58 66  BILITOT 1.3* 1.7* 1.6*  PROT 6.7 6.5 6.5  ALBUMIN 3.6 3.6 3.8    HbA1C: Hgb A1c MFr Bld  Date/Time Value Ref Range Status  08/29/2018 12:09 PM 5.9 4.6 - 6.5 % Final    Comment:    Glycemic Control Guidelines for People with Diabetes:Non Diabetic:  <6%Goal of Therapy: <7%Additional Action Suggested:  >8%   07/06/2017 11:52 AM 6.0 4.6 - 6.5 % Final    Comment:    Glycemic Control Guidelines for People with Diabetes:Non Diabetic:  <6%Goal of Therapy: <7%Additional Action Suggested:  >8%      Recent Results (from the past 240 hour(s))  Blood Culture (routine x 2)     Status: None   Collection Time: 08/27/19 10:17 PM   Specimen: BLOOD  Result Value Ref Range Status   Specimen Description BLOOD LEFT ANTECUBITAL  Final   Special Requests   Final    BOTTLES DRAWN AEROBIC AND ANAEROBIC Blood Culture adequate volume   Culture   Final    NO GROWTH 5 DAYS Performed at [facility], [address] [place], [place] [address]    Report Status 09/01/2019 FINAL  Final      Radiology  DG Chest Port 1 View  Result Date: 09/06/2019 CLINICAL DATA:  COVID-19 positivity EXAM: PORTABLE CHEST 1 VIEW COMPARISON:  08/27/2019 FINDINGS: Cardiac shadow is stable. Lungs are well aerated bilaterally. Increased bibasilar atelectasis is seen. No sizable effusion is noted. No bony abnormality is seen. IMPRESSION: Increased bibasilar atelectasis. Electronically Signed   By: Inez Catalina M.D.   On:  09/06/2019 08:28     Scheduled Meds: . enoxaparin (LOVENOX) injection  40 mg Subcutaneous Q24H  . escitalopram  10 mg Oral Daily  . levothyroxine  25 mcg Oral QAC breakfast  . loratadine  10 mg Oral Daily  . pantoprazole  40 mg Oral Daily  . umeclidinium bromide  1 puff Inhalation Daily      LOS: 10 days   [place] [facility] Office  [phone] Pager - Text Page per Shea Evans  If 7PM-7AM, please contact night-coverage per Amion 09/06/2019, 12:00 PM

## 2019-09-06 NOTE — Progress Notes (Signed)
Pt A&Ox4 pleasant and co-op with care, remains in the chair until transferred to 308 @1810 , O2 titrated from 3-2L [place] Sats 95% denies pain or discomfort, pending d/c in the am 09/07/19. Per PT pt desats while ambulating O2 titrated to 8L HFNC, once sitting pt's sats recovered quickly and was titrated back to O2@2L  [place], report given to [employer] with understanding prior to transfer.

## 2019-09-07 LAB — COMPREHENSIVE METABOLIC PANEL
ALT: 32 U/L (ref 0–44)
AST: 30 U/L (ref 15–41)
Albumin: 3.5 g/dL (ref 3.5–5.0)
Alkaline Phosphatase: 67 U/L (ref 38–126)
Anion gap: 11 (ref 5–15)
BUN: 44 mg/dL — ABNORMAL HIGH (ref 8–23)
CO2: 28 mmol/L (ref 22–32)
Calcium: 9.1 mg/dL (ref 8.9–10.3)
Chloride: 97 mmol/L — ABNORMAL LOW (ref 98–111)
Creatinine, Ser: 1.31 mg/dL — ABNORMAL HIGH (ref 0.61–1.24)
GFR calc Af Amer: 59 mL/min — ABNORMAL LOW (ref >60)
GFR calc non Af Amer: 51 mL/min — ABNORMAL LOW (ref >60)
Glucose, Bld: 91 mg/dL (ref 70–99)
Potassium: 4.2 mmol/L (ref 3.5–5.1)
Sodium: 136 mmol/L (ref 135–145)
Total Bilirubin: 1.5 mg/dL — ABNORMAL HIGH (ref 0.3–1.2)
Total Protein: 6 g/dL — ABNORMAL LOW (ref 6.5–8.1)

## 2019-09-07 NOTE — Progress Notes (Signed)
Chris Lewis  [id number] DOB: November 07, 1940 DOA: 08/27/2019 PCP: Biagio Borg, MD    Brief Narrative:  79yo with a history of COPD, hypothyroidism, depression, and status post mitral valve repair who presented to the ED with shortness of breath x4 days.  In the ED his Covid test was positive and a CT angiogram noted severe emphysema with no evidence of a pulmonary embolism.  Significant Events: 1/11 presented to [facility] ED - COVID + 1/12 transfer to [facility]  COVID-19 specific Treatment: Decadron 1/11 > 1/20 Remdesivir 1/11 > 1/15 Convalescent plasma 1/13 Actemra 1/12  Antimicrobials:  Anti-infectives (From admission, onward)   Start     Dose/Rate Route Frequency Ordered Stop   08/28/19 1000  remdesivir 100 mg in sodium chloride 0.9 % 100 mL IVPB     100 mg 200 mL/hr over 30 Minutes Intravenous Daily 08/27/19 2215 08/31/19 0949   08/27/19 2230  remdesivir 200 mg in sodium chloride 0.9% 250 mL IVPB     200 mg 580 mL/hr over 30 Minutes Intravenous Once 08/27/19 2215 08/28/19 0029       Subjective: Patient states that he is feeling better.  He states that he did get very short of breath with minimal activity he did with the therapist yesterday.  That was the first time he actually got up and walked.  Otherwise no other issues.  Denies nausea or vomiting.   Assessment & Plan:  Pneumonia due to COVID-19/acute respiratory failure with hypoxia Patient has completed course of remdesivir and steroids.  He also received Actemra and convalescent plasma. Patient's oxygen requirements have improved in the last 2 to 3 days.  He is now down to 3 L of oxygen by nasal cannula.  He saturating in the early 90s.  His D-dimer has improved.  Inflammatory markers had previously improved.   Continue with incentive spirometry mobilization prone positioning.  He will most likely need home oxygen.  Initially plan was for him to go home today.  However he did not do as well with occupational therapist  yesterday as was anticipated.  We will wait for him to improve a little bit more and become a little bit more stable before he is discharged home.   Continue to hold Lasix for now.  Leukocytosis due to steroids.  He remains afebrile.    Severe COPD Stable for the most part.  CT scan showed severe emphysematous changes.    AKI on CKD stage IIIa Slight rise in creatinine due to diuretics.  Monitor urine output.    Hyperkalemia Resolved  Hypothyroidism Continue usual home medical therapy  DVT prophylaxis: Lovenox Code Status: DNR Family Communication: Patient's wife was updated yesterday. Disposition Plan: Continue to work with PT and OT.  Hopefully home in the next 1 to 2 days.  Consultants:  none  Objective: Blood pressure (!) 98/52, pulse 74, temperature 97.7 F (36.5 C), temperature source Temporal, resp. rate 18, height 5\' 8"  (1.727 m), weight 80.5 kg, SpO2 93 %.  Intake/Output Summary (Last 24 hours) at 09/07/2019 1329 Last data filed at 09/07/2019 0912 Gross per 24 hour  Intake 240 ml  Output 270 ml  Net -30 ml   Filed Weights   08/27/19 2300  Weight: 80.5 kg    Examination:  General appearance: Awake alert.  In no distress Resp: Coarse breath sounds with crackles at the bases.  No wheezing or rhonchi. Cardio: S1-S2 is normal regular.  No S3-S4.  No rubs murmurs or bruit GI: Abdomen is  soft.  Nontender nondistended.  Bowel sounds are present normal.  No masses organomegaly Extremities: No edema.  Full range of motion of lower extremities. Neurologic: Alert and oriented x3.  No focal neurological deficits.    CBC: Recent Labs  Lab 09/03/19 0133 09/04/19 0556 09/06/19 0800  WBC 16.0* 16.2* 18.4*  HGB 18.7* 18.6* 18.7*  HCT 54.7* 56.2* 54.3*  MCV 89.7 90.4 90.2  PLT 240 214 [id number]*   Basic Metabolic Panel: Recent Labs  Lab 09/04/19 0556 09/06/19 0820 09/07/19 0455  NA 138 135 136  K 4.9 4.0 4.2  CL 102 96* 97*  CO2 26 28 28   GLUCOSE 102* 98 91  BUN  41* 46* 44*  CREATININE 1.15 1.26* 1.31*  CALCIUM 9.0 8.9 9.1   GFR: Estimated Creatinine Clearance: 43.5 mL/min (A) (by C-G formula based on SCr of 1.31 mg/dL (H)).  Liver Function Tests: Recent Labs  Lab 09/03/19 0133 09/06/19 0820 09/07/19 0455  AST 34 28 30  ALT 45* 30 32  ALKPHOS 58 66 67  BILITOT 1.7* 1.6* 1.5*  PROT 6.5 6.5 6.0*  ALBUMIN 3.6 3.8 3.5    HbA1C: Hgb A1c MFr Bld  Date/Time Value Ref Range Status  08/29/2018 12:09 PM 5.9 4.6 - 6.5 % Final    Comment:    Glycemic Control Guidelines for People with Diabetes:Non Diabetic:  <6%Goal of Therapy: <7%Additional Action Suggested:  >8%   07/06/2017 11:52 AM 6.0 4.6 - 6.5 % Final    Comment:    Glycemic Control Guidelines for People with Diabetes:Non Diabetic:  <6%Goal of Therapy: <7%Additional Action Suggested:  >8%      No results found for this or any previous visit (from the past 240 hour(s)).    Radiology  DG Chest Port 1 View  Result Date: 09/06/2019 CLINICAL DATA:  COVID-19 positivity EXAM: PORTABLE CHEST 1 VIEW COMPARISON:  08/27/2019 FINDINGS: Cardiac shadow is stable. Lungs are well aerated bilaterally. Increased bibasilar atelectasis is seen. No sizable effusion is noted. No bony abnormality is seen. IMPRESSION: Increased bibasilar atelectasis. Electronically Signed   By: Inez Catalina M.D.   On: 09/06/2019 08:28     Scheduled Meds: . enoxaparin (LOVENOX) injection  40 mg Subcutaneous Q24H  . escitalopram  10 mg Oral Daily  . levothyroxine  25 mcg Oral QAC breakfast  . loratadine  10 mg Oral Daily  . pantoprazole  40 mg Oral Daily  . umeclidinium bromide  1 puff Inhalation Daily      LOS: 11 days   [place] [facility] Office  [phone] Pager - Text Page per Shea Evans  If 7PM-7AM, please contact night-coverage per Amion 09/07/2019, 1:29 PM

## 2019-09-07 NOTE — Plan of Care (Signed)

## 2019-09-07 NOTE — Progress Notes (Signed)
Physical Therapy Treatment Patient Details Name: Chris Lewis MRN: [id number] DOB: 1940/11/28 Today's Date: 09/07/2019    History of Present Illness 79 y/o male w/ hx of TIA, CVA, PNA, osteopenia, mitral valve prolapse w/ repair, R lung lesion, hypothyroidism, GERD, depression, COPD, back surgery x 3. Pt presented to the ED with shortness of breath x4 days.  In the ED his Covid test was positive and a CT angiogram noted severe emphysema with no evidence of a pulmonary embolism (1/11)    PT Comments    Patient monitored from ear probe. On RA  83%. Ambulated x 40' on 3  L [place] 85%. Increased to 4 L to recover over 4 minutes back to 90%. Patient requiring  O2 and is very deconditioned. Patient reports the short walk was very tiring. It is the most that he has done in 10. Days. Continue progressive mobility/ monitor SPO2.   Left on 3 L A 89%.  HR ranging 84-98 with activity.  Follow Up Recommendations  Home health PT;Supervision/Assistance - 24 hour     Equipment Recommendations  Rolling walker with 5" wheels(if pt. does not have one)    Recommendations for Other Services       Precautions / Restrictions Precautions Precautions: Fall Precaution Comments: 02 drops significantly w/ little effort    Mobility  Bed Mobility               General bed mobility comments: Pt sitting in recliner upon arrival  Transfers   Equipment used: Rolling walker (2 wheeled) Transfers: Sit to/from Stand Sit to Stand: Min guard Stand pivot transfers: Min guard       General transfer comment: Min Guard A for safety in standing  Ambulation/Gait Ambulation/Gait assistance: Min assist;Min guard Gait Distance (Feet): 40 Feet Assistive device: Rolling walker (2 wheeled) Gait Pattern/deviations: Step-to pattern;Step-through pattern;Trunk flexed;Shuffle Gait velocity: decr   General Gait Details: patient with noted increased Sob.   Stairs             Wheelchair Mobility    Modified  [place] (Stroke Patients Only)       Balance   Sitting-balance support: Feet supported;No upper extremity supported Sitting balance-Leahy Scale: Good     Standing balance support: During functional activity;Bilateral upper extremity supported;No upper extremity supported Standing balance-Leahy Scale: Fair                              Cognition Arousal/Alertness: Awake/alert   Overall Cognitive Status: Within Functional Limits for tasks assessed                                 General Comments: appears improved in processing, no issues noted      Exercises Other Exercises Other Exercises: Leg extension, knee extension heel slides x 10    General Comments        Pertinent Vitals/Pain Faces Pain Scale: Hurts little more Pain Location: back Pain Descriptors / Indicators: Discomfort Pain Intervention(s): Monitored during session    Home Living                      Prior Function            PT Goals (current goals can now be found in the care plan section) Progress towards PT goals: Progressing toward goals    Frequency    Min 3X/week  PT Plan Current plan remains appropriate    Co-evaluation              AM-PAC PT "6 Clicks" Mobility   Outcome Measure  Help needed turning from your back to your side while in a flat bed without using bedrails?: A Little Help needed moving from lying on your back to sitting on the side of a flat bed without using bedrails?: A Little Help needed moving to and from a bed to a chair (including a wheelchair)?: A Little Help needed standing up from a chair using your arms (e.g., wheelchair or bedside chair)?: A Little Help needed to walk in hospital room?: A Little Help needed climbing 3-5 steps with a railing? : A Lot 6 Click Score: 17    End of Session Equipment Utilized During Treatment: Oxygen Activity Tolerance: Treatment limited secondary to medical complications  (Comment);Patient limited by fatigue Patient left: in chair;with call bell/phone within reach Nurse Communication: Mobility status PT Visit Diagnosis: Other abnormalities of gait and mobility (R26.89);Difficulty in walking, not elsewhere classified (R26.2)     Time: [id number] PT Time Calculation (min) (ACUTE ONLY): 33 min  Charges:  $Gait Training: 23-37 mins                     [place] Pager [phone] Office [phone]    Claretha Cooper 09/07/2019, 5:47 PM

## 2019-09-08 DIAGNOSIS — I959 Hypotension, unspecified: Secondary | ICD-10-CM

## 2019-09-08 LAB — COMPREHENSIVE METABOLIC PANEL
ALT: 32 U/L (ref 0–44)
AST: 29 U/L (ref 15–41)
Albumin: 3.3 g/dL — ABNORMAL LOW (ref 3.5–5.0)
Alkaline Phosphatase: 63 U/L (ref 38–126)
Anion gap: 9 (ref 5–15)
BUN: 36 mg/dL — ABNORMAL HIGH (ref 8–23)
CO2: 30 mmol/L (ref 22–32)
Calcium: 8.7 mg/dL — ABNORMAL LOW (ref 8.9–10.3)
Chloride: 97 mmol/L — ABNORMAL LOW (ref 98–111)
Creatinine, Ser: 1.29 mg/dL — ABNORMAL HIGH (ref 0.61–1.24)
GFR calc Af Amer: >60 mL/min (ref >60)
GFR calc non Af Amer: 52 mL/min — ABNORMAL LOW (ref >60)
Glucose, Bld: 99 mg/dL (ref 70–99)
Potassium: 4.3 mmol/L (ref 3.5–5.1)
Sodium: 136 mmol/L (ref 135–145)
Total Bilirubin: 1.4 mg/dL — ABNORMAL HIGH (ref 0.3–1.2)
Total Protein: 5.7 g/dL — ABNORMAL LOW (ref 6.5–8.1)

## 2019-09-08 MED ORDER — SODIUM CHLORIDE 0.9 % IV BOLUS
500.0000 mL | Freq: Once | INTRAVENOUS | Status: AC
  Administered 2019-09-08: 500 mL via INTRAVENOUS

## 2019-09-08 MED ORDER — HYDROCORTISONE NA SUCCINATE PF 100 MG IJ SOLR
50.0000 mg | Freq: Three times a day (TID) | INTRAMUSCULAR | Status: DC
  Administered 2019-09-08 – 2019-09-09 (×4): 50 mg via INTRAVENOUS
  Filled 2019-09-08 (×4): qty 2

## 2019-09-08 NOTE — Progress Notes (Signed)
Physical Therapy Treatment Patient Details Name: Chris Lewis MRN: [id number] DOB: 09-Jul-1941 Today's Date: 09/08/2019    History of Present Illness 79 y/o male w/ hx of TIA, CVA, PNA, osteopenia, mitral valve prolapse w/ repair, R lung lesion, hypothyroidism, GERD, depression, COPD, back surgery x 3. Pt presented to the ED with shortness of breath x4 days.  In the ED his Covid test was positive and a CT angiogram noted severe emphysema with no evidence of a pulmonary embolism (1/11)    PT Comments    Pt did well with tx this pm, took slightly increased time to recover after act task but was able to complete all. Pt needed SBA with transfers and min guard assist with ambulation. Pt ambulated approx 13ft x 3 with seated rest breaks with pursed lip breathing between each attempt.     Follow Up Recommendations  Home health PT;Supervision/Assistance - 24 hour     Equipment Recommendations  Rolling walker with 5" wheels    Recommendations for Other Services       Precautions / Restrictions Precautions Precautions: Fall Precaution Comments: 02 drops significantly w/ little effort Restrictions Weight Bearing Restrictions: No    Mobility  Bed Mobility               General bed mobility comments: pt in recliner at arrival  Transfers Overall transfer level: Needs assistance Equipment used: Rolling walker (2 wheeled) Transfers: Sit to/from Stand Sit to Stand: Supervision            Ambulation/Gait Ambulation/Gait assistance: Min guard Gait Distance (Feet): 40 Feet Assistive device: Rolling walker (2 wheeled) Gait Pattern/deviations: Step-through pattern Gait velocity: decr   General Gait Details: noted increased work of breathing but once seated able to calm and complete pursed lip breathing   Stairs             Wheelchair Mobility    Modified Rankin (Stroke Patients Only)       Balance Overall balance assessment: Needs assistance Sitting-balance  support: Feet supported Sitting balance-Leahy Scale: Good     Standing balance support: During functional activity;Bilateral upper extremity supported Standing balance-Leahy Scale: Fair                              Cognition Arousal/Alertness: Awake/alert Behavior During Therapy: WFL for tasks assessed/performed Overall Cognitive Status: Within Functional Limits for tasks assessed                                        Exercises      General Comments        Pertinent Vitals/Pain Pain Assessment: Faces Pain Score: 0-No pain Faces Pain Scale: No hurt    Home Living                      Prior Function            PT Goals (current goals can now be found in the care plan section) Acute Rehab PT Goals Patient Stated Goal: pt sitting in recliner at therapist arrival and is agreeable to tx PT Goal Formulation: With patient Time For Goal Achievement: 09/14/19 Potential to Achieve Goals: Good Progress towards PT goals: Progressing toward goals    Frequency    Min 3X/week      PT Plan Current plan remains appropriate  Co-evaluation              AM-PAC PT "6 Clicks" Mobility   Outcome Measure  Help needed turning from your back to your side while in a flat bed without using bedrails?: A Little Help needed moving from lying on your back to sitting on the side of a flat bed without using bedrails?: A Little Help needed moving to and from a bed to a chair (including a wheelchair)?: A Little Help needed standing up from a chair using your arms (e.g., wheelchair or bedside chair)?: A Little Help needed to walk in hospital room?: A Little Help needed climbing 3-5 steps with a railing? : A Lot 6 Click Score: 17    End of Session Equipment Utilized During Treatment: Oxygen Activity Tolerance: Treatment limited secondary to medical complications (Comment);Patient limited by fatigue;Patient limited by lethargy Patient left: in  chair;with call bell/phone within reach Nurse Communication: Mobility status PT Visit Diagnosis: Other abnormalities of gait and mobility (R26.89);Difficulty in walking, not elsewhere classified (R26.2)     Time: [id number] PT Time Calculation (min) (ACUTE ONLY): 24 min  Charges:  $Gait Training: 23-37 mins                     Horald Chestnut, PT    Delford Field 09/08/2019, 4:42 PM

## 2019-09-08 NOTE — Plan of Care (Signed)
  Problem: Health Behavior/Discharge Planning: Goal: Ability to manage health-related needs will improve Outcome: Progressing    Patient worked with PT to ambulate with walker. Will continue to monitor improvement in  o2 sats and BP. Hypotensive episode requiring 500 ml bolus NS xs 2 and hydrocortisone. Patient asymptomatic. BP returned to normotensive levels for patient.

## 2019-09-08 NOTE — Progress Notes (Signed)
Chris Lewis  [id number] DOB: 10-17-1940 DOA: 08/27/2019 PCP: Biagio Borg, MD    Brief Narrative:  79yo with a history of COPD, hypothyroidism, depression, and status post mitral valve repair who presented to the ED with shortness of breath x4 days.  In the ED his Covid test was positive and a CT angiogram noted severe emphysema with no evidence of a pulmonary embolism.  Significant Events: 1/11 presented to [facility] ED - COVID + 1/12 transfer to [facility]  COVID-19 specific Treatment: Decadron 1/11 > 1/20 Remdesivir 1/11 > 1/15 Convalescent plasma 1/13 Actemra 1/12  Antimicrobials:  Anti-infectives (From admission, onward)   Start     Dose/Rate Route Frequency Ordered Stop   08/28/19 1000  remdesivir 100 mg in sodium chloride 0.9 % 100 mL IVPB     100 mg 200 mL/hr over 30 Minutes Intravenous Daily 08/27/19 2215 08/31/19 0949   08/27/19 2230  remdesivir 200 mg in sodium chloride 0.9% 250 mL IVPB     200 mg 580 mL/hr over 30 Minutes Intravenous Once 08/27/19 2215 08/28/19 0029       Subjective: Patient states that he is feeling fine.  Denies any dizziness or lightheadedness.  Informed by the nursing staff that he was hypotensive this morning.     Assessment & Plan:  Hypotension Reason for low blood pressure is not entirely clear.  He is not on any blood pressure lowering drugs.  Usually does not have low blood pressures.  He is otherwise asymptomatic.  He is afebrile.  No evidence for volume loss.  Could be due to Lasix that he received during the course of this hospitalization.  Last dose was on 1/20.  He was given fluid bolus with only minimal improvement.  Patient has been on steroids which was abruptly discontinued.  Could have an element of adrenal insufficiency.  We will place him on hydrocortisone for now.  Continue to monitor for now.  Pneumonia due to COVID-19/acute respiratory failure with hypoxia Patient has completed course of remdesivir and steroids.  He also received  Actemra and convalescent plasma. Patient's respiratory status is stable.  He still requiring about 3 L of oxygen.  He feels well.  D-dimer has improved.  Inflammatory markers had previously improved. He will likely need home oxygen.  Continue with incentive spirometry mobilization. He is however still very deconditioned and did not do well with physical therapy yesterday.  He will benefit from few more sessions here in the hospital or may need to go to skilled nursing facility for rehab.    Severe COPD Stable for the most part.  CT scan showed severe emphysematous changes.    AKI on CKD stage IIIa Slight rise in creatinine due to diuretics.  Monitor urine output.    Hyperkalemia Resolved  Hypothyroidism Continue usual home medical therapy  DVT prophylaxis: Lovenox Code Status: DNR Family Communication: Patient's wife being updated daily Disposition Plan: Still hopeful that he will be able to go home in the next few days if he becomes stronger and if his blood pressure stabilizes.  Consultants:  none  Objective: Blood pressure (!) 87/60, pulse 76, temperature 97.8 F (36.6 C), temperature source Oral, resp. rate (!) 1, height 5\' 8"  (1.727 m), weight 80.5 kg, SpO2 93 %.  Intake/Output Summary (Last 24 hours) at 09/08/2019 1423 Last data filed at 09/08/2019 1219 Gross per 24 hour  Intake 420 ml  Output 1100 ml  Net -680 ml   Filed Weights   08/27/19 2300  Weight: 80.5 kg    Examination:  General appearance: Awake alert.  In no distress Resp: Normal effort at rest.  Few crackles at the bases.  No wheezing or rhonchi. Cardio: S1-S2 is normal regular.  No S3-S4.  No rubs murmurs or bruit GI: Abdomen is soft.  Nontender nondistended.  Bowel sounds are present normal.  No masses organomegaly Extremities: No edema.  Full range of motion of lower extremities. Neurologic: Alert and oriented x3.  No focal neurological deficits.     CBC: Recent Labs  Lab 09/03/19 0133 09/04/19  0556 09/06/19 0800  WBC 16.0* 16.2* 18.4*  HGB 18.7* 18.6* 18.7*  HCT 54.7* 56.2* 54.3*  MCV 89.7 90.4 90.2  PLT 240 214 [id number]*   Basic Metabolic Panel: Recent Labs  Lab 09/06/19 0820 09/07/19 0455 09/08/19 0454  NA 135 136 136  K 4.0 4.2 4.3  CL 96* 97* 97*  CO2 28 28 30   GLUCOSE 98 91 99  BUN 46* 44* 36*  CREATININE 1.26* 1.31* 1.29*  CALCIUM 8.9 9.1 8.7*   GFR: Estimated Creatinine Clearance: 44.2 mL/min (A) (by C-G formula based on SCr of 1.29 mg/dL (H)).  Liver Function Tests: Recent Labs  Lab 09/03/19 0133 09/06/19 0820 09/07/19 0455 09/08/19 0454  AST 34 28 30 29   ALT 45* 30 32 32  ALKPHOS 58 66 67 63  BILITOT 1.7* 1.6* 1.5* 1.4*  PROT 6.5 6.5 6.0* 5.7*  ALBUMIN 3.6 3.8 3.5 3.3*    HbA1C: Hgb A1c MFr Bld  Date/Time Value Ref Range Status  08/29/2018 12:09 PM 5.9 4.6 - 6.5 % Final    Comment:    Glycemic Control Guidelines for People with Diabetes:Non Diabetic:  <6%Goal of Therapy: <7%Additional Action Suggested:  >8%   07/06/2017 11:52 AM 6.0 4.6 - 6.5 % Final    Comment:    Glycemic Control Guidelines for People with Diabetes:Non Diabetic:  <6%Goal of Therapy: <7%Additional Action Suggested:  >8%      No results found for this or any previous visit (from the past 240 hour(s)).    Radiology  DG Chest Port 1 View  Result Date: 09/06/2019 CLINICAL DATA:  COVID-19 positivity EXAM: PORTABLE CHEST 1 VIEW COMPARISON:  08/27/2019 FINDINGS: Cardiac shadow is stable. Lungs are well aerated bilaterally. Increased bibasilar atelectasis is seen. No sizable effusion is noted. No bony abnormality is seen. IMPRESSION: Increased bibasilar atelectasis. Electronically Signed   By: Inez Catalina M.D.   On: 09/06/2019 08:28     Scheduled Meds: . enoxaparin (LOVENOX) injection  40 mg Subcutaneous Q24H  . escitalopram  10 mg Oral Daily  . hydrocortisone sod succinate (SOLU-CORTEF) inj  50 mg Intravenous Q8H  . levothyroxine  25 mcg Oral QAC breakfast  . loratadine   10 mg Oral Daily  . pantoprazole  40 mg Oral Daily  . umeclidinium bromide  1 puff Inhalation Daily      LOS: 12 days   [place] [facility] Office  [phone] Pager - Text Page per Shea Evans  If 7PM-7AM, please contact night-coverage per Amion 09/08/2019, 2:23 PM

## 2019-09-09 LAB — CBC
HCT: 47.7 % (ref 39.0–52.0)
Hemoglobin: 16 g/dL (ref 13.0–17.0)
MCH: 30.1 pg (ref 26.0–34.0)
MCHC: 33.5 g/dL (ref 30.0–36.0)
MCV: 89.8 fL (ref 80.0–100.0)
Platelets: 91 10*3/uL — ABNORMAL LOW (ref 150–400)
RBC: 5.31 MIL/uL (ref 4.22–5.81)
RDW: 13.6 % (ref 11.5–15.5)
WBC: 13.1 10*3/uL — ABNORMAL HIGH (ref 4.0–10.5)
nRBC: 0 % (ref 0.0–0.2)

## 2019-09-09 LAB — COMPREHENSIVE METABOLIC PANEL
ALT: 28 U/L (ref 0–44)
AST: 24 U/L (ref 15–41)
Albumin: 3.3 g/dL — ABNORMAL LOW (ref 3.5–5.0)
Alkaline Phosphatase: 64 U/L (ref 38–126)
Anion gap: 8 (ref 5–15)
BUN: 30 mg/dL — ABNORMAL HIGH (ref 8–23)
CO2: 26 mmol/L (ref 22–32)
Calcium: 8.4 mg/dL — ABNORMAL LOW (ref 8.9–10.3)
Chloride: 102 mmol/L (ref 98–111)
Creatinine, Ser: 1.01 mg/dL (ref 0.61–1.24)
GFR calc Af Amer: >60 mL/min (ref >60)
GFR calc non Af Amer: >60 mL/min (ref >60)
Glucose, Bld: 157 mg/dL — ABNORMAL HIGH (ref 70–99)
Potassium: 4.2 mmol/L (ref 3.5–5.1)
Sodium: 136 mmol/L (ref 135–145)
Total Bilirubin: 0.8 mg/dL (ref 0.3–1.2)
Total Protein: 5.4 g/dL — ABNORMAL LOW (ref 6.5–8.1)

## 2019-09-09 MED ORDER — HYDROCORTISONE NA SUCCINATE PF 100 MG IJ SOLR
50.0000 mg | Freq: Two times a day (BID) | INTRAMUSCULAR | Status: DC
  Administered 2019-09-10 (×2): 50 mg via INTRAVENOUS
  Filled 2019-09-09 (×2): qty 2

## 2019-09-09 MED ORDER — FLUTICASONE PROPIONATE 50 MCG/ACT NA SUSP
2.0000 | Freq: Every day | NASAL | Status: DC
  Administered 2019-09-09 – 2019-09-10 (×2): 2 via NASAL
  Filled 2019-09-09: qty 16

## 2019-09-09 NOTE — Progress Notes (Signed)
Chris Lewis  [id number] DOB: 1941/01/25 DOA: 08/27/2019 PCP: Biagio Borg, MD    Brief Narrative:  79yo with a history of COPD, hypothyroidism, depression, and status post mitral valve repair who presented to the ED with shortness of breath x4 days.  In the ED his Covid test was positive and a CT angiogram noted severe emphysema with no evidence of a pulmonary embolism.  Significant Events: 1/11 presented to [facility] ED - COVID + 1/12 transfer to [facility]  COVID-19 specific Treatment: Decadron 1/11 > 1/20 Remdesivir 1/11 > 1/15 Convalescent plasma 1/13 Actemra 1/12  Antimicrobials:  Anti-infectives (From admission, onward)   Start     Dose/Rate Route Frequency Ordered Stop   08/28/19 1000  remdesivir 100 mg in sodium chloride 0.9 % 100 mL IVPB     100 mg 200 mL/hr over 30 Minutes Intravenous Daily 08/27/19 2215 08/31/19 0949   08/27/19 2230  remdesivir 200 mg in sodium chloride 0.9% 250 mL IVPB     200 mg 580 mL/hr over 30 Minutes Intravenous Once 08/27/19 2215 08/28/19 0029       Subjective: Patient states that he is feeling much better today.  No shortness of breath.  Feels stronger.  Appetite is improving.   Assessment & Plan:  Hypotension Patient had drop in his blood pressure yesterday.  Reason for this was not entirely clear.  He was not on any antihypertensives.  The only possibility was adrenal insufficiency since his steroid was abruptly discontinued rather than being tapered off.  He was placed on hydrocortisone yesterday and given fluid bolus.  His blood pressure has responded appropriately.  We will titrate down his hydrocortisone and then change to oral tomorrow.  Will need a slow taper over the next 10 days to 2 weeks.    Pneumonia due to COVID-19/acute respiratory failure with hypoxia Patient has completed course of remdesivir and steroids.  He also received Actemra and convalescent plasma. His respiratory status remained stable.  He is requiring 3 L of oxygen by  nasal cannula.   He will likely need home oxygen.  Continue with incentive spirometry mobilization. He did not do well physical therapy couple of days ago.  However he has improved daily and did much better with them yesterday.  Continue to mobilize him and will like for him to work with physical therapy at least 1 more time before considering discharge.  Severe COPD Stable for the most part.  CT scan showed severe emphysematous changes.    AKI on CKD stage IIIa Slight rise in creatinine due to diuretics.  Monitor urine output.    Hyperkalemia Resolved  Hypothyroidism Continue usual home medical therapy  DVT prophylaxis: Lovenox Code Status: DNR Family Communication: Patient's wife being updated daily Disposition Plan: Hopefully home with home health in 1 to 2 days.    Consultants:  none  Objective: Blood pressure 105/72, pulse 63, temperature 97.7 F (36.5 C), temperature source Oral, resp. rate 16, height 5\' 8"  (1.727 m), weight 80.5 kg, SpO2 94 %.  Intake/Output Summary (Last 24 hours) at 09/09/2019 1406 Last data filed at 09/09/2019 0417 Gross per 24 hour  Intake --  Output 900 ml  Net -900 ml   Filed Weights   08/27/19 2300  Weight: 80.5 kg    Examination:  General appearance: Awake alert.  In no distress Resp: Normal effort at rest.  Few crackles at the bases.  No wheezing or rhonchi.   Cardio: S1-S2 is normal regular.  No S3-S4.  No rubs murmurs or bruit GI: Abdomen is soft.  Nontender nondistended.  Bowel sounds are present normal.  No masses organomegaly Extremities: No edema.  Full range of motion of lower extremities. Neurologic: Alert and oriented x3.  No focal neurological deficits.     CBC: Recent Labs  Lab 09/04/19 0556 09/06/19 0800 09/09/19 0540  WBC 16.2* 18.4* 13.1*  HGB 18.6* 18.7* 16.0  HCT 56.2* 54.3* 47.7  MCV 90.4 90.2 89.8  PLT 214 112* 91*   Basic Metabolic Panel: Recent Labs  Lab 09/07/19 0455 09/08/19 0454 09/09/19 0540   NA 136 136 136  K 4.2 4.3 4.2  CL 97* 97* 102  CO2 28 30 26   GLUCOSE 91 99 157*  BUN 44* 36* 30*  CREATININE 1.31* 1.29* 1.01  CALCIUM 9.1 8.7* 8.4*   GFR: Estimated Creatinine Clearance: 56.4 mL/min (by C-G formula based on SCr of 1.01 mg/dL).  Liver Function Tests: Recent Labs  Lab 09/06/19 0820 09/07/19 0455 09/08/19 0454 09/09/19 0540  AST 28 30 29 24   ALT 30 32 32 28  ALKPHOS 66 67 63 64  BILITOT 1.6* 1.5* 1.4* 0.8  PROT 6.5 6.0* 5.7* 5.4*  ALBUMIN 3.8 3.5 3.3* 3.3*    HbA1C: Hgb A1c MFr Bld  Date/Time Value Ref Range Status  08/29/2018 12:09 PM 5.9 4.6 - 6.5 % Final    Comment:    Glycemic Control Guidelines for People with Diabetes:Non Diabetic:  <6%Goal of Therapy: <7%Additional Action Suggested:  >8%   07/06/2017 11:52 AM 6.0 4.6 - 6.5 % Final    Comment:    Glycemic Control Guidelines for People with Diabetes:Non Diabetic:  <6%Goal of Therapy: <7%Additional Action Suggested:  >8%      No results found for this or any previous visit (from the past 240 hour(s)).    Radiology  DG Chest Port 1 View  Result Date: 09/06/2019 CLINICAL DATA:  COVID-19 positivity EXAM: PORTABLE CHEST 1 VIEW COMPARISON:  08/27/2019 FINDINGS: Cardiac shadow is stable. Lungs are well aerated bilaterally. Increased bibasilar atelectasis is seen. No sizable effusion is noted. No bony abnormality is seen. IMPRESSION: Increased bibasilar atelectasis. Electronically Signed   By: Inez Catalina M.D.   On: 09/06/2019 08:28     Scheduled Meds: . enoxaparin (LOVENOX) injection  40 mg Subcutaneous Q24H  . escitalopram  10 mg Oral Daily  . fluticasone  2 spray Each Nare Daily  . hydrocortisone sod succinate (SOLU-CORTEF) inj  50 mg Intravenous Q8H  . levothyroxine  25 mcg Oral QAC breakfast  . loratadine  10 mg Oral Daily  . pantoprazole  40 mg Oral Daily  . umeclidinium bromide  1 puff Inhalation Daily      LOS: 13 days   [place] [facility] Office   [phone] Pager - Text Page per Shea Evans  If 7PM-7AM, please contact night-coverage per Amion 09/09/2019, 2:06 PM

## 2019-09-09 NOTE — Plan of Care (Signed)
  Problem: Education: Goal: Knowledge of General Education information will improve Description: Including pain rating scale, medication(s)/side effects and non-pharmacologic comfort measures Outcome: Progressing   Problem: Health Behavior/Discharge Planning: Goal: Ability to manage health-related needs will improve Outcome: Progressing   Problem: Clinical Measurements: Goal: Ability to maintain clinical measurements within normal limits will improve Outcome: Progressing   Problem: Clinical Measurements: Goal: Will remain free from infection Outcome: Progressing   Problem: Clinical Measurements: Goal: Diagnostic test results will improve Outcome: Progressing   Problem: Clinical Measurements: Goal: Respiratory complications will improve Outcome: Progressing   Problem: Clinical Measurements: Goal: Cardiovascular complication will be avoided Outcome: Progressing   Problem: Activity: Goal: Risk for activity intolerance will decrease Outcome: Progressing   Problem: Nutrition: Goal: Adequate nutrition will be maintained Outcome: Progressing   Problem: Coping: Goal: Level of anxiety will decrease Outcome: Progressing   Problem: Elimination: Goal: Will not experience complications related to bowel motility Outcome: Progressing   Problem: Elimination: Goal: Will not experience complications related to urinary retention Outcome: Progressing   Problem: Pain Managment: Goal: General experience of comfort will improve Outcome: Progressing   Problem: Safety: Goal: Ability to remain free from injury will improve Outcome: Progressing   Problem: Skin Integrity: Goal: Risk for impaired skin integrity will decrease Outcome: Progressing   

## 2019-09-10 LAB — COMPREHENSIVE METABOLIC PANEL
ALT: 28 U/L (ref 0–44)
AST: 25 U/L (ref 15–41)
Albumin: 3.1 g/dL — ABNORMAL LOW (ref 3.5–5.0)
Alkaline Phosphatase: 49 U/L (ref 38–126)
Anion gap: 8 (ref 5–15)
BUN: 27 mg/dL — ABNORMAL HIGH (ref 8–23)
CO2: 26 mmol/L (ref 22–32)
Calcium: 8.4 mg/dL — ABNORMAL LOW (ref 8.9–10.3)
Chloride: 103 mmol/L (ref 98–111)
Creatinine, Ser: 1.01 mg/dL (ref 0.61–1.24)
GFR calc Af Amer: >60 mL/min (ref >60)
GFR calc non Af Amer: >60 mL/min (ref >60)
Glucose, Bld: 121 mg/dL — ABNORMAL HIGH (ref 70–99)
Potassium: 4 mmol/L (ref 3.5–5.1)
Sodium: 137 mmol/L (ref 135–145)
Total Bilirubin: 1 mg/dL (ref 0.3–1.2)
Total Protein: 5.2 g/dL — ABNORMAL LOW (ref 6.5–8.1)

## 2019-09-10 MED ORDER — HYDROCORTISONE 20 MG PO TABS: ORAL_TABLET | ORAL | 0 refills | Status: AC

## 2019-09-10 NOTE — Discharge Instructions (Signed)
COVID-19 COVID-19 is a respiratory infection that is caused by a virus called severe acute respiratory syndrome coronavirus 2 (SARS-CoV-2). The disease is also known as coronavirus disease or novel coronavirus. In some people, the virus may not cause any symptoms. In others, it may cause a serious infection. The infection can get worse quickly and can lead to complications, such as:  Pneumonia, or infection of the lungs.  Acute respiratory distress syndrome or ARDS. This is a condition in which fluid build-up in the lungs prevents the lungs from filling with air and passing oxygen into the blood.  Acute respiratory failure. This is a condition in which there is not enough oxygen passing from the lungs to the body or when carbon dioxide is not passing from the lungs out of the body.  Sepsis or septic shock. This is a serious bodily reaction to an infection.  Blood clotting problems.  Secondary infections due to bacteria or fungus.  Organ failure. This is when your body's organs stop working. The virus that causes COVID-19 is contagious. This means that it can spread from person to person through droplets from coughs and sneezes (respiratory secretions). What are the causes? This illness is caused by a virus. You may catch the virus by:  Breathing in droplets from an infected person. Droplets can be spread by a person breathing, speaking, singing, coughing, or sneezing.  Touching something, like a table or a doorknob, that was exposed to the virus (contaminated) and then touching your mouth, nose, or eyes. What increases the risk? Risk for infection You are more likely to be infected with this virus if you:  Are within 6 feet (2 meters) of a person with COVID-19.  Provide care for or live with a person who is infected with COVID-19.  Spend time in crowded indoor spaces or live in shared housing. Risk for serious illness You are more likely to become seriously ill from the virus if you:   Are 50 years of age or older. The higher your age, the more you are at risk for serious illness.  Live in a nursing home or long-term care facility.  Have cancer.  Have a long-term (chronic) disease such as: ? Chronic lung disease, including chronic obstructive pulmonary disease or asthma. ? A long-term disease that lowers your body's ability to fight infection (immunocompromised). ? Heart disease, including heart failure, a condition in which the arteries that lead to the heart become narrow or blocked (coronary artery disease), a disease which makes the heart muscle thick, weak, or stiff (cardiomyopathy). ? Diabetes. ? Chronic kidney disease. ? Sickle cell disease, a condition in which red blood cells have an abnormal "sickle" shape. ? Liver disease.  Are obese. What are the signs or symptoms? Symptoms of this condition can range from mild to severe. Symptoms may appear any time from 2 to 14 days after being exposed to the virus. They include:  A fever or chills.  A cough.  Difficulty breathing.  Headaches, body aches, or muscle aches.  Runny or stuffy (congested) nose.  A sore throat.  New loss of taste or smell. Some people may also have stomach problems, such as nausea, vomiting, or diarrhea. Other people may not have any symptoms of COVID-19. How is this diagnosed? This condition may be diagnosed based on:  Your signs and symptoms, especially if: ? You live in an area with a COVID-19 outbreak. ? You recently traveled to or from an area where the virus is common. ? You   provide care for or live with a person who was diagnosed with COVID-19. ? You were exposed to a person who was diagnosed with COVID-19.  A physical exam.  Lab tests, which may include: ? Taking a sample of fluid from the back of your nose and throat (nasopharyngeal fluid), your nose, or your throat using a swab. ? A sample of mucus from your lungs (sputum). ? Blood tests.  Imaging tests, which  may include, X-rays, CT scan, or ultrasound. How is this treated? At present, there is no medicine to treat COVID-19. Medicines that treat other diseases are being used on a trial basis to see if they are effective against COVID-19. Your health care provider will talk with you about ways to treat your symptoms. For most people, the infection is mild and can be managed at home with rest, fluids, and over-the-counter medicines. Treatment for a serious infection usually takes places in a hospital intensive care unit (ICU). It may include one or more of the following treatments. These treatments are given until your symptoms improve.  Receiving fluids and medicines through an IV.  Supplemental oxygen. Extra oxygen is given through a tube in the nose, a face mask, or a hood.  Positioning you to lie on your stomach (prone position). This makes it easier for oxygen to get into the lungs.  Continuous positive airway pressure (CPAP) or bi-level positive airway pressure (BPAP) machine. This treatment uses mild air pressure to keep the airways open. A tube that is connected to a motor delivers oxygen to the body.  Ventilator. This treatment moves air into and out of the lungs by using a tube that is placed in your windpipe.  Tracheostomy. This is a procedure to create a hole in the neck so that a breathing tube can be inserted.  Extracorporeal membrane oxygenation (ECMO). This procedure gives the lungs a chance to recover by taking over the functions of the heart and lungs. It supplies oxygen to the body and removes carbon dioxide. Follow these instructions at home: Lifestyle  If you are sick, stay home except to get medical care. Your health care provider will tell you how long to stay home. Call your health care provider before you go for medical care.  Rest at home as told by your health care provider.  Do not use any products that contain nicotine or tobacco, such as cigarettes, e-cigarettes, and  chewing tobacco. If you need help quitting, ask your health care provider.  Return to your normal activities as told by your health care provider. Ask your health care provider what activities are safe for you. General instructions  Take over-the-counter and prescription medicines only as told by your health care provider.  Drink enough fluid to keep your urine pale yellow.  Keep all follow-up visits as told by your health care provider. This is important. How is this prevented?  There is no vaccine to help prevent COVID-19 infection. However, there are steps you can take to protect yourself and others from this virus. To protect yourself:   Do not travel to areas where COVID-19 is a risk. The areas where COVID-19 is reported change often. To identify high-risk areas and travel restrictions, check the CDC travel website: wwwnc.cdc.gov/travel/notices  If you live in, or must travel to, an area where COVID-19 is a risk, take precautions to avoid infection. ? Stay away from people who are sick. ? Wash your hands often with soap and water for 20 seconds. If soap and water   are not available, use an alcohol-based hand sanitizer. ? Avoid touching your mouth, face, eyes, or nose. ? Avoid going out in public, follow guidance from your state and local health authorities. ? If you must go out in public, wear a cloth face covering or face mask. Make sure your mask covers your nose and mouth. ? Avoid crowded indoor spaces. Stay at least 6 feet (2 meters) away from others. ? Disinfect objects and surfaces that are frequently touched every day. This may include:  Counters and tables.  Doorknobs and light switches.  Sinks and faucets.  Electronics, such as phones, remote controls, keyboards, computers, and tablets. To protect others: If you have symptoms of COVID-19, take steps to prevent the virus from spreading to others.  If you think you have a COVID-19 infection, contact your health care  provider right away. Tell your health care team that you think you may have a COVID-19 infection.  Stay home. Leave your house only to seek medical care. Do not use public transport.  Do not travel while you are sick.  Wash your hands often with soap and water for 20 seconds. If soap and water are not available, use alcohol-based hand sanitizer.  Stay away from other members of your household. Let healthy household members care for children and pets, if possible. If you have to care for children or pets, wash your hands often and wear a mask. If possible, stay in your own room, separate from others. Use a different bathroom.  Make sure that all people in your household wash their hands well and often.  Cough or sneeze into a tissue or your sleeve or elbow. Do not cough or sneeze into your hand or into the air.  Wear a cloth face covering or face mask. Make sure your mask covers your nose and mouth. Where to find more information  Centers for Disease Control and Prevention: www.cdc.gov/coronavirus/2019-ncov/index.html  World Health Organization: www.who.int/health-topics/coronavirus Contact a health care provider if:  You live in or have traveled to an area where COVID-19 is a risk and you have symptoms of the infection.  You have had contact with someone who has COVID-19 and you have symptoms of the infection. Get help right away if:  You have trouble breathing.  You have pain or pressure in your chest.  You have confusion.  You have bluish lips and fingernails.  You have difficulty waking from sleep.  You have symptoms that get worse. These symptoms may represent a serious problem that is an emergency. Do not wait to see if the symptoms will go away. Get medical help right away. Call your local emergency services (911 in the [place] Do not drive yourself to the hospital. Let the emergency medical personnel know if you think you have COVID-19. Summary  COVID-19 is a  respiratory infection that is caused by a virus. It is also known as coronavirus disease or novel coronavirus. It can cause serious infections, such as pneumonia, acute respiratory distress syndrome, acute respiratory failure, or sepsis.  The virus that causes COVID-19 is contagious. This means that it can spread from person to person through droplets from breathing, speaking, singing, coughing, or sneezing.  You are more likely to develop a serious illness if you are 50 years of age or older, have a weak immune system, live in a nursing home, or have chronic disease.  There is no medicine to treat COVID-19. Your health care provider will talk with you about ways to treat your symptoms.    Take steps to protect yourself and others from infection. Wash your hands often and disinfect objects and surfaces that are frequently touched every day. Stay away from people who are sick and wear a mask if you are sick. This information is not intended to replace advice given to you by your health care provider. Make sure you discuss any questions you have with your health care provider. Document Revised: 06/01/2019 Document Reviewed: 09/07/2018 Elsevier Patient Education  2020 [employer]  

## 2019-09-10 NOTE — Plan of Care (Signed)

## 2019-09-10 NOTE — TOC Transition Note (Signed)
Transition of Care [facility]) - CM/SW Discharge Note   Patient Details  Name: Chris Lewis MRN: [id number] Date of Birth: [birth year]  [facility] CM/SW Contact:  Sekai Nayak, Chauncey Reading, RN Phone Number: 09/10/2019, 1:38 PM   Clinical Narrative:    Patient discharging home today. Qualifies for oxygen. Will have POC tank delivered to room prior to DC. Discussed home health with patient, he is agreeable if any agency can take his insurance since [facility] is his primary. Referred out.    Final next level of care: [place] Barriers to Discharge: Barriers Resolved   Patient Goals and CMS Choice Patient states their goals for this hospitalization and ongoing recovery are:: return home with home health CMS Medicare.gov Compare Post Acute Care list provided to:: Patient Choice offered to / list presented to : Patient     Discharge Plan and Services                DME Arranged: Oxygen DME Agency: [facility] Date DME Agency Contacted: 09/10/19 Time DME Agency Contacted: [phone] Representative spoke with at DME Agency: [place]: PT, OT [place] Agency: [place]        Social Determinants of Health ([place]) Interventions     Readmission Risk Interventions No flowsheet data found.

## 2019-09-10 NOTE — Discharge Summary (Signed)
[facility]  Physician Discharge Summary   Patient ID: Chris Lewis MRN: [id number] DOB/AGE: [birth year] 79 y.o.  Admit date: 08/27/2019 Discharge date: 09/10/2019  PCP: Biagio Borg, MD  DISCHARGE DIAGNOSES:  Pneumonia due to COVID-19 Acute respiratory failure with hypoxia Hypotension now improved History of COPD Chronic kidney disease stage IIIa Hypothyroidism  RECOMMENDATIONS FOR OUTPATIENT FOLLOW UP: 1. Home health ordered 2. Home oxygen   Home Health: PT and OT Equipment/Devices: Home oxygen  CODE STATUS: DNR  DISCHARGE CONDITION: fair  Diet recommendation: As before  INITIAL HISTORY: 79yo with a history of COPD, hypothyroidism, depression, and status post mitral valve repair who presented to the ED with shortness of breath x4 days.  In the ED his Covid test was positive and a CT angiogram noted severe emphysema with no evidence of a pulmonary embolism.   HOSPITAL COURSE:   Hypotension Patient started having low blood pressures about 2 days ago.  Reason for this was not entirely clear . He was not on any antihypertensives.  The only possibility was adrenal insufficiency since his steroid was abruptly discontinued rather than being tapered off.  He was placed on hydrocortisone and given fluid bolus.  His blood pressure responded appropriately.  Will discharge him on a longer course of steroid taper.  He feels much better.  Pneumonia due to COVID-19/acute respiratory failure with hypoxia Patient has completed course of remdesivir and steroids.  He also received Actemra and convalescent plasma. His respiratory status remained stable.  He is requiring 3 L of oxygen by nasal cannula.   He will need home oxygen which will be ordered.  Home health PT and OT will also be ordered.    Severe COPD Stable for the most part.  CT scan showed severe emphysematous changes.    AKI on CKD stage IIIa Slight rise in creatinine due to diuretics.    Now  resolved.  Hyperkalemia Resolved  Hypothyroidism Continue usual home medical therapy  Overall stable.  Okay for discharge home today with home health.   PERTINENT LABS:  The results of significant diagnostics from this hospitalization (including imaging, microbiology, ancillary and laboratory) are listed below for reference.     Labs:    Basic Metabolic Panel: Recent Labs  Lab 09/06/19 0820 09/07/19 0455 09/08/19 0454 09/09/19 0540 09/10/19 0540  NA 135 136 136 136 137  K 4.0 4.2 4.3 4.2 4.0  CL 96* 97* 97* 102 103  CO2 28 28 30 26 26   GLUCOSE 98 91 99 157* 121*  BUN 46* 44* 36* 30* 27*  CREATININE 1.26* 1.31* 1.29* 1.01 1.01  CALCIUM 8.9 9.1 8.7* 8.4* 8.4*   Liver Function Tests: Recent Labs  Lab 09/06/19 0820 09/07/19 0455 09/08/19 0454 09/09/19 0540 09/10/19 0540  AST 28 30 29 24 25   ALT 30 32 32 28 28  ALKPHOS 66 67 63 64 49  BILITOT 1.6* 1.5* 1.4* 0.8 1.0  PROT 6.5 6.0* 5.7* 5.4* 5.2*  ALBUMIN 3.8 3.5 3.3* 3.3* 3.1*   CBC: Recent Labs  Lab 09/04/19 0556 09/06/19 0800 09/09/19 0540  WBC 16.2* 18.4* 13.1*  HGB 18.6* 18.7* 16.0  HCT 56.2* 54.3* 47.7  MCV 90.4 90.2 89.8  PLT 214 112* 91*   BNP: BNP (last 3 results) Recent Labs    08/28/19 0206  BNP 77.5     IMAGING STUDIES CT Angio Chest PE W and/or Wo Contrast  Result Date: 08/28/2019 CLINICAL DATA:  79 year old male positive COVID-19. Chest pain and shortness of breath.  EXAM: CT ANGIOGRAPHY CHEST WITH CONTRAST TECHNIQUE: Multidetector CT imaging of the chest was performed using the standard protocol during bolus administration of intravenous contrast. Multiplanar CT image reconstructions and MIPs were obtained to evaluate the vascular anatomy. CONTRAST:  30mL OMNIPAQUE IOHEXOL 350 MG/ML SOLN COMPARISON:  CTA chest 12/12/2015. FINDINGS: Cardiovascular: Good contrast bolus timing in the pulmonary arterial tree. Respiratory motion, especially in the lower lobes. Subsequently some lower  lobe branch detail is degraded but otherwise No focal filling defect identified in the pulmonary arteries to suggest acute pulmonary embolism. Stable borderline to mild cardiomegaly since 2017. No contrast in the aorta today. Stable tortuosity of the thoracic aorta. Mediastinum/Nodes: Negative. No lymphadenopathy. Lungs/Pleura: Major airways are patent. Severe chronic emphysema. Right upper chronic architectural distortion. Superimposed confluent bilateral middle and lower lobe ground-glass opacity on series 7, image 72. Posterior lower lobe involvement trending toward consolidation. The costophrenic angles are relatively spared. No pleural effusion. Chronic calcified granuloma in the superior segment of the right lower lobe. Upper Abdomen: Surgically absent gallbladder. Negative visible noncontrast liver, spleen, pancreas, adrenal glands and kidneys. Small hiatal hernia but otherwise negative visible bowel. Musculoskeletal: Osteopenia. No acute osseous abnormality identified. Review of the MIP images confirms the above findings. IMPRESSION: 1. No acute pulmonary embolus identified. 2. Severe Emphysema (ICD10-J43.9) with superimposed bilateral middle and lower lobe ground-glass opacity compatible with COVID-19 Pneumonia in this setting. No pleural effusion. Electronically Signed   By: Genevie Ann M.D.   On: 08/28/2019 02:43   DG Chest Port 1 View  Result Date: 09/06/2019 CLINICAL DATA:  COVID-19 positivity EXAM: PORTABLE CHEST 1 VIEW COMPARISON:  08/27/2019 FINDINGS: Cardiac shadow is stable. Lungs are well aerated bilaterally. Increased bibasilar atelectasis is seen. No sizable effusion is noted. No bony abnormality is seen. IMPRESSION: Increased bibasilar atelectasis. Electronically Signed   By: Inez Catalina M.D.   On: 09/06/2019 08:28   DG Chest Port 1 View  Result Date: 08/27/2019 CLINICAL DATA:  Shortness of breath EXAM: PORTABLE CHEST 1 VIEW COMPARISON:  11/25/2016 FINDINGS: The heart size and mediastinal  contours are within normal limits. Prior mitral valve repair. Hyperinflated lungs. Prominent bibasilar interstitial markings, right slightly greater than left. No pleural effusion or pneumothorax. Stable right lower lobe calcified granuloma. IMPRESSION: Prominent bibasilar interstitial markings, right slightly greater than left. Findings could represent chronic interstitial lung disease or atypical/viral infection. Electronically Signed   By: Davina Poke D.O.   On: 08/27/2019 21:51    DISCHARGE EXAMINATION: Vitals:   09/10/19 0341 09/10/19 0720 09/10/19 0721 09/10/19 0930  BP: 113/86 117/69    Pulse: 76 70    Resp: 18 17    Temp: 97.7 F (36.5 C) (!) 97.5 F (36.4 C)    TempSrc: Oral Oral    SpO2: 93% 96% 96% 93%  Weight:      Height:       General appearance: Awake alert.  In no distress Resp: Improved air entry bilaterally.  Continues to have a few crackles at the bases.  No wheezing or rhonchi. Cardio: S1-S2 is normal regular.  No S3-S4.  No rubs murmurs or bruit GI: Abdomen is soft.  Nontender nondistended.  Bowel sounds are present normal.  No masses organomegaly Extremities: No edema.  Full range of motion of lower extremities. Neurologic: Alert and oriented x3.  No focal neurological deficits.    DISPOSITION: Home with home health  Discharge Instructions    Call MD for:  difficulty breathing, headache or visual disturbances   Complete by:  As directed    Call MD for:  extreme fatigue   Complete by: As directed    Call MD for:  persistant dizziness or light-headedness   Complete by: As directed    Call MD for:  persistant nausea and vomiting   Complete by: As directed    Call MD for:  severe uncontrolled pain   Complete by: As directed    Call MD for:  temperature >100.4   Complete by: As directed    Discharge instructions   Complete by: As directed    Please take your medications as prescribed.  Please follow-up with your primary care provider in 2 weeks.  COVID  19 INSTRUCTIONS  - You are felt to be stable enough to no longer require inpatient monitoring, testing, and treatment, though you will need to follow the recommendations below: - Based on the CDC's non-test criteria for ending self-isolation: You may not return to work/leave the home until at least 21 days since symptom onset AND 3 days without a fever (without taking tylenol, ibuprofen, etc.) AND have improvement in respiratory symptoms. - Do not take NSAID medications (including, but not limited to, ibuprofen, advil, motrin, naproxen, aleve, goody's powder, etc.) - Follow up with your doctor in the next week via telehealth or seek medical attention right away if your symptoms get WORSE.  - Consider donating plasma after you have recovered (either 14 days after a negative test or 28 days after symptoms have completely resolved) because your antibodies to this virus may be helpful to give to others with life-threatening infections. Please go to the website www.[email or web address] if you would like to consider volunteering for plasma donation.    Directions for you at home:  Wear a facemask You should wear a facemask that covers your nose and mouth when you are in the same room with other people and when you visit a healthcare provider. People who live with or visit you should also wear a facemask while they are in the same room with you.  Separate yourself from other people in your home As much as possible, you should stay in a different room from other people in your home. Also, you should use a separate bathroom, if available.  Avoid sharing household items You should not share dishes, drinking glasses, cups, eating utensils, towels, bedding, or other items with other people in your home. After using these items, you should wash them thoroughly with soap and water.  Cover your coughs and sneezes Cover your mouth and nose with a tissue when you cough or sneeze, or you can cough or sneeze into your  sleeve. Throw used tissues in a lined trash can, and immediately wash your hands with soap and water for at least 20 seconds or use an alcohol-based hand rub.  Wash your [facility] your hands often and thoroughly with soap and water for at least 20 seconds. You can use an alcohol-based hand sanitizer if soap and water are not available and if your hands are not visibly dirty. Avoid touching your eyes, nose, and mouth with unwashed hands.  Directions for those who live with, or provide care at home for you:  Limit the number of people who have contact with the patient If possible, have only one caregiver for the patient. Other household members should stay in another home or place of residence. If this is not possible, they should stay in another room, or be separated from the patient as much as possible. Use a separate  bathroom, if available. Restrict visitors who do not have an essential need to be in the home.  Ensure good ventilation Make sure that shared spaces in the home have good air flow, such as from an air conditioner or an opened window, weather permitting.  Wash your hands often Wash your hands often and thoroughly with soap and water for at least 20 seconds. You can use an alcohol based hand sanitizer if soap and water are not available and if your hands are not visibly dirty. Avoid touching your eyes, nose, and mouth with unwashed hands. Use disposable paper towels to dry your hands. If not available, use dedicated cloth towels and replace them when they become wet.  Wear a facemask and gloves Wear a disposable facemask at all times in the room and gloves when you touch or have contact with the patient's blood, body fluids, and/or secretions or excretions, such as sweat, saliva, sputum, nasal mucus, vomit, urine, or feces.  Ensure the mask fits over your nose and mouth tightly, and do not touch it during use. Throw out disposable facemasks and gloves after using them. Do not  reuse. Wash your hands immediately after removing your facemask and gloves. If your personal clothing becomes contaminated, carefully remove clothing and launder. Wash your hands after handling contaminated clothing. Place all used disposable facemasks, gloves, and other waste in a lined container before disposing them with other household waste. Remove gloves and wash your hands immediately after handling these items.  Do not share dishes, glasses, or other household items with the patient Avoid sharing household items. You should not share dishes, drinking glasses, cups, eating utensils, towels, bedding, or other items with a patient who is confirmed to have, or being evaluated for, COVID-19 infection. After the person uses these items, you should wash them thoroughly with soap and water.  Wash laundry thoroughly Immediately remove and wash clothes or bedding that have blood, body fluids, and/or secretions or excretions, such as sweat, saliva, sputum, nasal mucus, vomit, urine, or feces, on them. Wear gloves when handling laundry from the patient. Read and follow directions on labels of laundry or clothing items and detergent. In general, wash and dry with the warmest temperatures recommended on the label.  Clean all areas the individual has used often Clean all touchable surfaces, such as counters, tabletops, doorknobs, bathroom fixtures, toilets, phones, keyboards, tablets, and bedside tables, every day. Also, clean any surfaces that may have blood, body fluids, and/or secretions or excretions on them. Wear gloves when cleaning surfaces the patient has come in contact with. Use a diluted bleach solution (e.g., dilute bleach with 1 part bleach and 10 parts water) or a household disinfectant with a label that says EPA-registered for coronaviruses. To make a bleach solution at home, add 1 tablespoon of bleach to 1 quart (4 cups) of water. For a larger supply, add  cup of bleach to 1 gallon (16  cups) of water. Read labels of cleaning products and follow recommendations provided on product labels. Labels contain instructions for safe and effective use of the cleaning product including precautions you should take when applying the product, such as wearing gloves or eye protection and making sure you have good ventilation during use of the product. Remove gloves and wash hands immediately after cleaning.  Monitor yourself for signs and symptoms of illness Caregivers and household members are considered close contacts, should monitor their health, and will be asked to limit movement outside of the home to the extent possible.  Follow the monitoring steps for close contacts listed on the symptom monitoring form.   If you have additional questions, contact your local health department or call the epidemiologist on call at [phone] (available 24/7). This guidance is subject to change. For the most up-to-date guidance from [facility], please refer to their website: YouBlogs.pl   You were cared for by a hospitalist during your hospital stay. If you have any questions about your discharge medications or the care you received while you were in the hospital after you are discharged, you can call the unit and asked to speak with the hospitalist on call if the hospitalist that took care of you is not available. Once you are discharged, your primary care physician will handle any further medical issues. Please note that NO REFILLS for any discharge medications will be authorized once you are discharged, as it is imperative that you return to your primary care physician (or establish a relationship with a primary care physician if you do not have one) for your aftercare needs so that they can reassess your need for medications and monitor your lab values. If you do not have a primary care physician, you can call [phone] for a physician referral.    Increase activity slowly   Complete by: As directed         Allergies as of 09/10/2019      Reactions   Duac [clindamycin Phos-benzoyl Perox] Rash   Contrast Media [iodinated Diagnostic Agents] Rash   Heavy rash and itching 20 yrs ago. Needs full premeds and does well with this.   Atorvastatin    muscle pain   Propoxyphene N-acetaminophen Itching   rash   Rosuvastatin    muscle weakness   Metrizamide Rash   radiopaque contrast agent. Heavy rash and itching 20 yrs ago. Needs full premeds and does well with this.   Natural Vegetable Orange [psyllium]    Heartburn   Other Rash   High acid food--tomatoes, orange juice   Tomato    Heartburn      Medication List    STOP taking these medications   fluticasone 50 MCG/ACT nasal spray Commonly known as: FLONASE     TAKE these medications   albuterol 108 (90 Base) MCG/ACT inhaler Commonly known as: Ventolin HFA INHALE 2 PUFFS BY MOUTH EVERY 4 HOURS AS NEEDED FOR WHEEZE What changed:   how much to take  how to take this  when to take this  reasons to take this  additional instructions   azelastine 0.1 % nasal spray Commonly known as: ASTELIN Place 2 sprays into both nostrils at bedtime. Use in each nostril as directed   calcium carbonate 750 MG chewable tablet Commonly known as: TUMS EX Chew 1 tablet by mouth as needed (for gas and reflux).   cetirizine 10 MG tablet Commonly known as: ZYRTEC Take 1 tablet (10 mg total) by mouth daily as needed for allergies. Reported on 10/27/2015   clonazePAM 0.5 MG tablet Commonly known as: KLONOPIN take 1 tablet by mouth twice a day if needed What changed:   how much to take  how to take this  when to take this  reasons to take this  additional instructions   diclofenac sodium 1 % Gel Commonly known as: Voltaren Apply 2 g topically 4 (four) times daily.   escitalopram 10 MG tablet Commonly known as: LEXAPRO TAKE 1 TABLET BY MOUTH EVERY DAY   hydrocortisone  20 MG tablet Commonly known as: CORTEF Take 2 tablets  twice daily for 4 days, followed by 1 tablet twice daily for 4 days, followed by 1 tablet once daily for 4 days, then STOP.   levothyroxine 25 MCG tablet Commonly known as: SYNTHROID TAKE 1 TABLET BY MOUTH EVERY DAY BEFORE BREAKFAST What changed: See the new instructions.   metroNIDAZOLE 0.75 % gel Commonly known as: METROGEL Apply 1 application topically 2 (two) times daily.   pantoprazole 40 MG tablet Commonly known as: PROTONIX Take 40 mg by mouth daily.   Spiriva HandiHaler 18 MCG inhalation capsule Generic drug: tiotropium PLACE 1 CAPSULE INTO INHALER AND INHALE DAILY What changed:   how much to take  how to take this  when to take this  additional instructions   traMADol 50 MG tablet Commonly known as: ULTRAM Take 1 tablet (50 mg total) by mouth every 12 (twelve) hours as needed. What changed: reasons to take this   Vitamin D3 25 MCG (1000 UT) Caps Take 1,000 Units by mouth daily. Reported on 09/16/2015            Durable Medical Equipment  (From admission, onward)         Start     Ordered   09/10/19 1020  DME Oxygen  (Discharge Planning)  Once    Question Answer Comment  Length of Need 6 Months   Mode or (Route) Nasal cannula   Liters per Minute 2   Frequency Continuous (stationary and portable oxygen unit needed)   Oxygen conserving device Yes   Oxygen delivery system Gas      09/10/19 1019           Follow-up Information    Biagio Borg, MD. Schedule an appointment as soon as possible for a visit in 2 week(s).   Specialties: Internal Medicine, Radiology Contact information: [place] [address] [phone]           TOTAL DISCHARGE TIME: 31 minutes  [place]  [facility] Pager on [email or web address]  09/10/2019, 1:36 PM

## 2019-09-10 NOTE — Plan of Care (Signed)
  Problem: Education: Goal: Knowledge of General Education information will improve Description: Including pain rating scale, medication(s)/side effects and non-pharmacologic comfort measures 09/10/2019 1819 by Almon Register, RN Outcome: Adequate for Discharge 09/10/2019 1305 by Almon Register, RN Outcome: Progressing   Problem: Health Behavior/Discharge Planning: Goal: Ability to manage health-related needs will improve 09/10/2019 1819 by Almon Register, RN Outcome: Adequate for Discharge 09/10/2019 1305 by Almon Register, RN Outcome: Progressing   Problem: Clinical Measurements: Goal: Ability to maintain clinical measurements within normal limits will improve 09/10/2019 1819 by Almon Register, RN Outcome: Adequate for Discharge 09/10/2019 1305 by Almon Register, RN Outcome: Progressing Goal: Will remain free from infection 09/10/2019 1819 by Almon Register, RN Outcome: Adequate for Discharge 09/10/2019 1305 by Almon Register, RN Outcome: Progressing Goal: Diagnostic test results will improve 09/10/2019 1819 by Almon Register, RN Outcome: Adequate for Discharge 09/10/2019 1305 by Almon Register, RN Outcome: Progressing Goal: Respiratory complications will improve 09/10/2019 1819 by Almon Register, RN Outcome: Adequate for Discharge 09/10/2019 1305 by Almon Register, RN Outcome: Progressing Goal: Cardiovascular complication will be avoided 09/10/2019 1819 by Almon Register, RN Outcome: Adequate for Discharge 09/10/2019 1305 by Almon Register, RN Outcome: Progressing   Problem: Activity: Goal: Risk for activity intolerance will decrease 09/10/2019 1819 by Almon Register, RN Outcome: Adequate for Discharge 09/10/2019 1305 by Almon Register, RN Outcome: Progressing   Problem: Nutrition: Goal: Adequate nutrition will be maintained 09/10/2019 1819 by Almon Register, RN Outcome: Adequate for  Discharge 09/10/2019 1305 by Almon Register, RN Outcome: Progressing   Problem: Coping: Goal: Level of anxiety will decrease 09/10/2019 1819 by Almon Register, RN Outcome: Adequate for Discharge 09/10/2019 1305 by Almon Register, RN Outcome: Progressing   Problem: Elimination: Goal: Will not experience complications related to bowel motility 09/10/2019 1819 by Almon Register, RN Outcome: Adequate for Discharge 09/10/2019 1305 by Almon Register, RN Outcome: Progressing Goal: Will not experience complications related to urinary retention 09/10/2019 1819 by Almon Register, RN Outcome: Adequate for Discharge 09/10/2019 1305 by Almon Register, RN Outcome: Progressing   Problem: Pain Managment: Goal: General experience of comfort will improve 09/10/2019 1819 by Almon Register, RN Outcome: Adequate for Discharge 09/10/2019 1305 by Almon Register, RN Outcome: Progressing   Problem: Safety: Goal: Ability to remain free from injury will improve 09/10/2019 1819 by Almon Register, RN Outcome: Adequate for Discharge 09/10/2019 1305 by Almon Register, RN Outcome: Progressing   Problem: Skin Integrity: Goal: Risk for impaired skin integrity will decrease 09/10/2019 1819 by Almon Register, RN Outcome: Adequate for Discharge 09/10/2019 1305 by Almon Register, RN Outcome: Progressing

## 2019-09-10 NOTE — Progress Notes (Signed)
OT Cancellation Note  Patient Details Name: Chris Lewis MRN: [id number] DOB: [birth year]   Cancelled Treatment:    Reason Eval/Treat Not Completed: Other (comment)(Pt recently mobilized to bathroom with RN and received lunch. Fatigued and requesting to finish lunch. Will return as schedule allows.)  [place], OTR/L Acute Rehab Pager: [phone] Office: [phone] 09/10/2019, 12:41 PM

## 2019-09-10 NOTE — Progress Notes (Signed)
SATURATION QUALIFICATIONS: (This note is used to comply with regulatory documentation for home oxygen)  Patient Saturations on Room Air at Rest = 87%  Patient Saturations on Room Air while Ambulating = 82%  Patient Saturations on 2 Liters of oxygen while Ambulating = 89%  Please briefly explain why patient needs home oxygen: Patient also becomes very short of breath and fatigued after walking ten to fifteen feet at a time.

## 2019-09-17 DIAGNOSIS — J439 Emphysema, unspecified: Secondary | ICD-10-CM | POA: Diagnosis not present

## 2019-09-17 DIAGNOSIS — J9601 Acute respiratory failure with hypoxia: Secondary | ICD-10-CM | POA: Diagnosis not present

## 2019-09-17 DIAGNOSIS — I214 Non-ST elevation (NSTEMI) myocardial infarction: Secondary | ICD-10-CM | POA: Diagnosis not present

## 2019-09-18 ENCOUNTER — Inpatient Hospital Stay (HOSPITAL_COMMUNITY)
Admission: EM | Admit: 2019-09-18 | Discharge: 2019-10-15 | DRG: 870 | Disposition: E | Payer: BC Managed Care – PPO | Attending: Emergency Medicine | Admitting: Emergency Medicine

## 2019-09-18 ENCOUNTER — Encounter (HOSPITAL_COMMUNITY): Payer: Self-pay | Admitting: Emergency Medicine

## 2019-09-18 ENCOUNTER — Other Ambulatory Visit: Payer: Self-pay

## 2019-09-18 ENCOUNTER — Emergency Department (HOSPITAL_COMMUNITY): Payer: BC Managed Care – PPO

## 2019-09-18 DIAGNOSIS — R918 Other nonspecific abnormal finding of lung field: Secondary | ICD-10-CM | POA: Diagnosis not present

## 2019-09-18 DIAGNOSIS — J449 Chronic obstructive pulmonary disease, unspecified: Secondary | ICD-10-CM | POA: Diagnosis not present

## 2019-09-18 DIAGNOSIS — I76 Septic arterial embolism: Secondary | ICD-10-CM | POA: Diagnosis present

## 2019-09-18 DIAGNOSIS — B948 Sequelae of other specified infectious and parasitic diseases: Secondary | ICD-10-CM | POA: Diagnosis not present

## 2019-09-18 DIAGNOSIS — E1165 Type 2 diabetes mellitus with hyperglycemia: Secondary | ICD-10-CM | POA: Diagnosis present

## 2019-09-18 DIAGNOSIS — I4891 Unspecified atrial fibrillation: Secondary | ICD-10-CM | POA: Diagnosis not present

## 2019-09-18 DIAGNOSIS — G9341 Metabolic encephalopathy: Secondary | ICD-10-CM | POA: Diagnosis present

## 2019-09-18 DIAGNOSIS — I669 Occlusion and stenosis of unspecified cerebral artery: Secondary | ICD-10-CM | POA: Diagnosis not present

## 2019-09-18 DIAGNOSIS — R404 Transient alteration of awareness: Secondary | ICD-10-CM | POA: Diagnosis not present

## 2019-09-18 DIAGNOSIS — N179 Acute kidney failure, unspecified: Secondary | ICD-10-CM | POA: Diagnosis not present

## 2019-09-18 DIAGNOSIS — A4102 Sepsis due to Methicillin resistant Staphylococcus aureus: Principal | ICD-10-CM | POA: Diagnosis present

## 2019-09-18 DIAGNOSIS — N390 Urinary tract infection, site not specified: Secondary | ICD-10-CM | POA: Diagnosis not present

## 2019-09-18 DIAGNOSIS — Z794 Long term (current) use of insulin: Secondary | ICD-10-CM | POA: Diagnosis not present

## 2019-09-18 DIAGNOSIS — I400 Infective myocarditis: Secondary | ICD-10-CM | POA: Diagnosis not present

## 2019-09-18 DIAGNOSIS — I110 Hypertensive heart disease with heart failure: Secondary | ICD-10-CM | POA: Diagnosis present

## 2019-09-18 DIAGNOSIS — F419 Anxiety disorder, unspecified: Secondary | ICD-10-CM | POA: Diagnosis present

## 2019-09-18 DIAGNOSIS — R739 Hyperglycemia, unspecified: Secondary | ICD-10-CM | POA: Diagnosis not present

## 2019-09-18 DIAGNOSIS — R000 Tachycardia, unspecified: Secondary | ICD-10-CM | POA: Diagnosis not present

## 2019-09-18 DIAGNOSIS — I50811 Acute right heart failure: Secondary | ICD-10-CM | POA: Diagnosis not present

## 2019-09-18 DIAGNOSIS — I2699 Other pulmonary embolism without acute cor pulmonale: Secondary | ICD-10-CM | POA: Diagnosis present

## 2019-09-18 DIAGNOSIS — I2609 Other pulmonary embolism with acute cor pulmonale: Secondary | ICD-10-CM | POA: Diagnosis present

## 2019-09-18 DIAGNOSIS — J9601 Acute respiratory failure with hypoxia: Secondary | ICD-10-CM | POA: Diagnosis not present

## 2019-09-18 DIAGNOSIS — B369 Superficial mycosis, unspecified: Secondary | ICD-10-CM | POA: Diagnosis not present

## 2019-09-18 DIAGNOSIS — R509 Fever, unspecified: Secondary | ICD-10-CM

## 2019-09-18 DIAGNOSIS — J439 Emphysema, unspecified: Secondary | ICD-10-CM | POA: Diagnosis not present

## 2019-09-18 DIAGNOSIS — G030 Nonpyogenic meningitis: Secondary | ICD-10-CM | POA: Diagnosis present

## 2019-09-18 DIAGNOSIS — U071 COVID-19: Secondary | ICD-10-CM | POA: Diagnosis not present

## 2019-09-18 DIAGNOSIS — I255 Ischemic cardiomyopathy: Secondary | ICD-10-CM | POA: Diagnosis not present

## 2019-09-18 DIAGNOSIS — I5021 Acute systolic (congestive) heart failure: Secondary | ICD-10-CM | POA: Diagnosis not present

## 2019-09-18 DIAGNOSIS — R1110 Vomiting, unspecified: Secondary | ICD-10-CM

## 2019-09-18 DIAGNOSIS — E039 Hypothyroidism, unspecified: Secondary | ICD-10-CM | POA: Diagnosis present

## 2019-09-18 DIAGNOSIS — I340 Nonrheumatic mitral (valve) insufficiency: Secondary | ICD-10-CM | POA: Diagnosis not present

## 2019-09-18 DIAGNOSIS — R6520 Severe sepsis without septic shock: Secondary | ICD-10-CM | POA: Diagnosis not present

## 2019-09-18 DIAGNOSIS — G8194 Hemiplegia, unspecified affecting left nondominant side: Secondary | ICD-10-CM | POA: Diagnosis present

## 2019-09-18 DIAGNOSIS — R7881 Bacteremia: Secondary | ICD-10-CM | POA: Diagnosis not present

## 2019-09-18 DIAGNOSIS — Z87891 Personal history of nicotine dependence: Secondary | ICD-10-CM

## 2019-09-18 DIAGNOSIS — I6310 Cerebral infarction due to embolism of unspecified precerebral artery: Secondary | ICD-10-CM | POA: Diagnosis not present

## 2019-09-18 DIAGNOSIS — D649 Anemia, unspecified: Secondary | ICD-10-CM | POA: Diagnosis not present

## 2019-09-18 DIAGNOSIS — R6521 Severe sepsis with septic shock: Secondary | ICD-10-CM | POA: Diagnosis not present

## 2019-09-18 DIAGNOSIS — F329 Major depressive disorder, single episode, unspecified: Secondary | ICD-10-CM | POA: Diagnosis present

## 2019-09-18 DIAGNOSIS — G9340 Encephalopathy, unspecified: Secondary | ICD-10-CM | POA: Diagnosis not present

## 2019-09-18 DIAGNOSIS — R4182 Altered mental status, unspecified: Secondary | ICD-10-CM | POA: Diagnosis not present

## 2019-09-18 DIAGNOSIS — Z79899 Other long term (current) drug therapy: Secondary | ICD-10-CM

## 2019-09-18 DIAGNOSIS — D72829 Elevated white blood cell count, unspecified: Secondary | ICD-10-CM | POA: Diagnosis not present

## 2019-09-18 DIAGNOSIS — R209 Unspecified disturbances of skin sensation: Secondary | ICD-10-CM | POA: Diagnosis not present

## 2019-09-18 DIAGNOSIS — R140 Abdominal distension (gaseous): Secondary | ICD-10-CM | POA: Diagnosis not present

## 2019-09-18 DIAGNOSIS — I5041 Acute combined systolic (congestive) and diastolic (congestive) heart failure: Secondary | ICD-10-CM | POA: Diagnosis not present

## 2019-09-18 DIAGNOSIS — R579 Shock, unspecified: Secondary | ICD-10-CM

## 2019-09-18 DIAGNOSIS — J9690 Respiratory failure, unspecified, unspecified whether with hypoxia or hypercapnia: Secondary | ICD-10-CM

## 2019-09-18 DIAGNOSIS — I214 Non-ST elevation (NSTEMI) myocardial infarction: Secondary | ICD-10-CM | POA: Diagnosis present

## 2019-09-18 DIAGNOSIS — K222 Esophageal obstruction: Secondary | ICD-10-CM | POA: Diagnosis present

## 2019-09-18 DIAGNOSIS — J9600 Acute respiratory failure, unspecified whether with hypoxia or hypercapnia: Secondary | ICD-10-CM | POA: Diagnosis not present

## 2019-09-18 DIAGNOSIS — Z515 Encounter for palliative care: Secondary | ICD-10-CM | POA: Diagnosis not present

## 2019-09-18 DIAGNOSIS — D696 Thrombocytopenia, unspecified: Secondary | ICD-10-CM | POA: Diagnosis present

## 2019-09-18 DIAGNOSIS — Z9911 Dependence on respirator [ventilator] status: Secondary | ICD-10-CM | POA: Diagnosis not present

## 2019-09-18 DIAGNOSIS — I5082 Biventricular heart failure: Secondary | ICD-10-CM | POA: Diagnosis not present

## 2019-09-18 DIAGNOSIS — J15212 Pneumonia due to Methicillin resistant Staphylococcus aureus: Secondary | ICD-10-CM | POA: Diagnosis present

## 2019-09-18 DIAGNOSIS — Z66 Do not resuscitate: Secondary | ICD-10-CM | POA: Diagnosis present

## 2019-09-18 DIAGNOSIS — E785 Hyperlipidemia, unspecified: Secondary | ICD-10-CM | POA: Diagnosis present

## 2019-09-18 DIAGNOSIS — E1159 Type 2 diabetes mellitus with other circulatory complications: Secondary | ICD-10-CM | POA: Diagnosis not present

## 2019-09-18 DIAGNOSIS — J168 Pneumonia due to other specified infectious organisms: Secondary | ICD-10-CM | POA: Diagnosis not present

## 2019-09-18 DIAGNOSIS — E870 Hyperosmolality and hypernatremia: Secondary | ICD-10-CM | POA: Diagnosis not present

## 2019-09-18 DIAGNOSIS — Z952 Presence of prosthetic heart valve: Secondary | ICD-10-CM

## 2019-09-18 DIAGNOSIS — I2510 Atherosclerotic heart disease of native coronary artery without angina pectoris: Secondary | ICD-10-CM | POA: Diagnosis present

## 2019-09-18 DIAGNOSIS — Z8249 Family history of ischemic heart disease and other diseases of the circulatory system: Secondary | ICD-10-CM

## 2019-09-18 DIAGNOSIS — Z8042 Family history of malignant neoplasm of prostate: Secondary | ICD-10-CM

## 2019-09-18 DIAGNOSIS — Y95 Nosocomial condition: Secondary | ICD-10-CM | POA: Diagnosis present

## 2019-09-18 DIAGNOSIS — I517 Cardiomegaly: Secondary | ICD-10-CM | POA: Diagnosis not present

## 2019-09-18 DIAGNOSIS — A4902 Methicillin resistant Staphylococcus aureus infection, unspecified site: Secondary | ICD-10-CM | POA: Diagnosis not present

## 2019-09-18 DIAGNOSIS — Z8616 Personal history of COVID-19: Secondary | ICD-10-CM | POA: Diagnosis not present

## 2019-09-18 DIAGNOSIS — L8990 Pressure ulcer of unspecified site, unspecified stage: Secondary | ICD-10-CM | POA: Insufficient documentation

## 2019-09-18 DIAGNOSIS — Z452 Encounter for adjustment and management of vascular access device: Secondary | ICD-10-CM | POA: Diagnosis not present

## 2019-09-18 DIAGNOSIS — E875 Hyperkalemia: Secondary | ICD-10-CM | POA: Diagnosis not present

## 2019-09-18 DIAGNOSIS — G039 Meningitis, unspecified: Secondary | ICD-10-CM | POA: Diagnosis not present

## 2019-09-18 DIAGNOSIS — I639 Cerebral infarction, unspecified: Secondary | ICD-10-CM | POA: Diagnosis not present

## 2019-09-18 DIAGNOSIS — I5189 Other ill-defined heart diseases: Secondary | ICD-10-CM | POA: Diagnosis not present

## 2019-09-18 DIAGNOSIS — I6340 Cerebral infarction due to embolism of unspecified cerebral artery: Secondary | ICD-10-CM | POA: Diagnosis not present

## 2019-09-18 DIAGNOSIS — I499 Cardiac arrhythmia, unspecified: Secondary | ICD-10-CM | POA: Diagnosis not present

## 2019-09-18 DIAGNOSIS — I330 Acute and subacute infective endocarditis: Secondary | ICD-10-CM | POA: Diagnosis not present

## 2019-09-18 DIAGNOSIS — I4892 Unspecified atrial flutter: Secondary | ICD-10-CM | POA: Diagnosis present

## 2019-09-18 DIAGNOSIS — E874 Mixed disorder of acid-base balance: Secondary | ICD-10-CM | POA: Diagnosis present

## 2019-09-18 DIAGNOSIS — B9561 Methicillin susceptible Staphylococcus aureus infection as the cause of diseases classified elsewhere: Secondary | ICD-10-CM | POA: Diagnosis not present

## 2019-09-18 DIAGNOSIS — Z8701 Personal history of pneumonia (recurrent): Secondary | ICD-10-CM | POA: Diagnosis not present

## 2019-09-18 DIAGNOSIS — J302 Other seasonal allergic rhinitis: Secondary | ICD-10-CM | POA: Diagnosis present

## 2019-09-18 DIAGNOSIS — Z209 Contact with and (suspected) exposure to unspecified communicable disease: Secondary | ICD-10-CM | POA: Diagnosis not present

## 2019-09-18 DIAGNOSIS — I700 Atherosclerosis of aorta: Secondary | ICD-10-CM | POA: Diagnosis present

## 2019-09-18 DIAGNOSIS — I4510 Unspecified right bundle-branch block: Secondary | ICD-10-CM | POA: Diagnosis present

## 2019-09-18 DIAGNOSIS — I82409 Acute embolism and thrombosis of unspecified deep veins of unspecified lower extremity: Secondary | ICD-10-CM | POA: Diagnosis not present

## 2019-09-18 DIAGNOSIS — J189 Pneumonia, unspecified organism: Secondary | ICD-10-CM

## 2019-09-18 DIAGNOSIS — J984 Other disorders of lung: Secondary | ICD-10-CM | POA: Diagnosis not present

## 2019-09-18 DIAGNOSIS — Z801 Family history of malignant neoplasm of trachea, bronchus and lung: Secondary | ICD-10-CM

## 2019-09-18 DIAGNOSIS — Z823 Family history of stroke: Secondary | ICD-10-CM

## 2019-09-18 DIAGNOSIS — Z4682 Encounter for fitting and adjustment of non-vascular catheter: Secondary | ICD-10-CM | POA: Diagnosis not present

## 2019-09-18 DIAGNOSIS — Z743 Need for continuous supervision: Secondary | ICD-10-CM | POA: Diagnosis not present

## 2019-09-18 DIAGNOSIS — K219 Gastro-esophageal reflux disease without esophagitis: Secondary | ICD-10-CM | POA: Diagnosis present

## 2019-09-18 DIAGNOSIS — Z800 Family history of malignant neoplasm of digestive organs: Secondary | ICD-10-CM

## 2019-09-18 DIAGNOSIS — Z789 Other specified health status: Secondary | ICD-10-CM

## 2019-09-18 DIAGNOSIS — Z8673 Personal history of transient ischemic attack (TIA), and cerebral infarction without residual deficits: Secondary | ICD-10-CM

## 2019-09-18 DIAGNOSIS — R791 Abnormal coagulation profile: Secondary | ICD-10-CM | POA: Diagnosis present

## 2019-09-18 DIAGNOSIS — M1990 Unspecified osteoarthritis, unspecified site: Secondary | ICD-10-CM | POA: Diagnosis present

## 2019-09-18 DIAGNOSIS — J432 Centrilobular emphysema: Secondary | ICD-10-CM | POA: Diagnosis present

## 2019-09-18 DIAGNOSIS — J9691 Respiratory failure, unspecified with hypoxia: Secondary | ICD-10-CM | POA: Diagnosis not present

## 2019-09-18 DIAGNOSIS — A419 Sepsis, unspecified organism: Secondary | ICD-10-CM | POA: Diagnosis not present

## 2019-09-18 LAB — COMPREHENSIVE METABOLIC PANEL
ALT: 37 U/L (ref 0–44)
AST: 55 U/L — ABNORMAL HIGH (ref 15–41)
Albumin: 2.7 g/dL — ABNORMAL LOW (ref 3.5–5.0)
Alkaline Phosphatase: 87 U/L (ref 38–126)
Anion gap: 15 (ref 5–15)
BUN: 25 mg/dL — ABNORMAL HIGH (ref 8–23)
CO2: 19 mmol/L — ABNORMAL LOW (ref 22–32)
Calcium: 7.9 mg/dL — ABNORMAL LOW (ref 8.9–10.3)
Chloride: 98 mmol/L (ref 98–111)
Creatinine, Ser: 1.61 mg/dL — ABNORMAL HIGH (ref 0.61–1.24)
GFR calc Af Amer: 46 mL/min — ABNORMAL LOW (ref >60)
GFR calc non Af Amer: 40 mL/min — ABNORMAL LOW (ref >60)
Glucose, Bld: 121 mg/dL — ABNORMAL HIGH (ref 70–99)
Potassium: 4.5 mmol/L (ref 3.5–5.1)
Sodium: 132 mmol/L — ABNORMAL LOW (ref 135–145)
Total Bilirubin: 2 mg/dL — ABNORMAL HIGH (ref 0.3–1.2)
Total Protein: 5.5 g/dL — ABNORMAL LOW (ref 6.5–8.1)

## 2019-09-18 LAB — CBC WITH DIFFERENTIAL/PLATELET
Abs Immature Granulocytes: 0.13 10*3/uL — ABNORMAL HIGH (ref 0.00–0.07)
Basophils Absolute: 0.1 10*3/uL (ref 0.0–0.1)
Basophils Relative: 1 %
Eosinophils Absolute: 0 10*3/uL (ref 0.0–0.5)
Eosinophils Relative: 0 %
HCT: 41.9 % (ref 39.0–52.0)
Hemoglobin: 13.8 g/dL (ref 13.0–17.0)
Immature Granulocytes: 1 %
Lymphocytes Relative: 3 %
Lymphs Abs: 0.4 10*3/uL — ABNORMAL LOW (ref 0.7–4.0)
MCH: 30.5 pg (ref 26.0–34.0)
MCHC: 32.9 g/dL (ref 30.0–36.0)
MCV: 92.5 fL (ref 80.0–100.0)
Monocytes Absolute: 0.4 10*3/uL (ref 0.1–1.0)
Monocytes Relative: 3 %
Neutro Abs: 12.5 10*3/uL — ABNORMAL HIGH (ref 1.7–7.7)
Neutrophils Relative %: 92 %
Platelets: 56 10*3/uL — ABNORMAL LOW (ref 150–400)
RBC: 4.53 MIL/uL (ref 4.22–5.81)
RDW: 15.9 % — ABNORMAL HIGH (ref 11.5–15.5)
WBC: 13.5 10*3/uL — ABNORMAL HIGH (ref 4.0–10.5)
nRBC: 0 % (ref 0.0–0.2)

## 2019-09-18 LAB — PROCALCITONIN: Procalcitonin: 3.36 ng/mL

## 2019-09-18 LAB — FIBRINOGEN: Fibrinogen: 393 mg/dL (ref 210–475)

## 2019-09-18 LAB — POCT I-STAT 7, (LYTES, BLD GAS, ICA,H+H)
Acid-base deficit: 3 mmol/L — ABNORMAL HIGH (ref 0.0–2.0)
Bicarbonate: 19.2 mmol/L — ABNORMAL LOW (ref 20.0–28.0)
Calcium, Ion: 1.1 mmol/L — ABNORMAL LOW (ref 1.15–1.40)
HCT: 38 % — ABNORMAL LOW (ref 39.0–52.0)
Hemoglobin: 12.9 g/dL — ABNORMAL LOW (ref 13.0–17.0)
O2 Saturation: 92 %
Patient temperature: 102
Potassium: 4.1 mmol/L (ref 3.5–5.1)
Sodium: 134 mmol/L — ABNORMAL LOW (ref 135–145)
TCO2: 20 mmol/L — ABNORMAL LOW (ref 22–32)
pCO2 arterial: 28.2 mmHg — ABNORMAL LOW (ref 32.0–48.0)
pH, Arterial: 7.448 (ref 7.350–7.450)
pO2, Arterial: 65 mmHg — ABNORMAL LOW (ref 83.0–108.0)

## 2019-09-18 LAB — URINALYSIS, ROUTINE W REFLEX MICROSCOPIC
Bilirubin Urine: NEGATIVE
Glucose, UA: NEGATIVE mg/dL
Ketones, ur: NEGATIVE mg/dL
Nitrite: NEGATIVE
Protein, ur: 100 mg/dL — AB
Specific Gravity, Urine: 1.021 (ref 1.005–1.030)
pH: 5 (ref 5.0–8.0)

## 2019-09-18 LAB — PROTIME-INR
INR: 1.3 — ABNORMAL HIGH (ref 0.8–1.2)
Prothrombin Time: 16.1 seconds — ABNORMAL HIGH (ref 11.4–15.2)

## 2019-09-18 LAB — C-REACTIVE PROTEIN: CRP: 18.6 mg/dL — ABNORMAL HIGH (ref <1.0)

## 2019-09-18 LAB — D-DIMER, QUANTITATIVE: D-Dimer, Quant: 17.5 ug/mL-FEU — ABNORMAL HIGH (ref 0.00–0.50)

## 2019-09-18 LAB — LACTIC ACID, PLASMA
Lactic Acid, Venous: 4.6 mmol/L (ref 0.5–1.9)
Lactic Acid, Venous: 3.4 mmol/L (ref 0.5–1.9)

## 2019-09-18 LAB — LACTATE DEHYDROGENASE: LDH: 353 U/L — ABNORMAL HIGH (ref 98–192)

## 2019-09-18 LAB — TRIGLYCERIDES: Triglycerides: 139 mg/dL (ref <150)

## 2019-09-18 LAB — BRAIN NATRIURETIC PEPTIDE: B Natriuretic Peptide: 1252 pg/mL — ABNORMAL HIGH (ref 0.0–100.0)

## 2019-09-18 LAB — TROPONIN I (HIGH SENSITIVITY): Troponin I (High Sensitivity): 1534 ng/L (ref <18)

## 2019-09-18 LAB — FERRITIN: Ferritin: 878 ng/mL — ABNORMAL HIGH (ref 24–336)

## 2019-09-18 LAB — TSH: TSH: 2.737 u[IU]/mL (ref 0.350–4.500)

## 2019-09-18 MED ORDER — PANTOPRAZOLE SODIUM 40 MG IV SOLR
40.0000 mg | Freq: Every day | INTRAVENOUS | Status: DC
  Administered 2019-09-19 (×2): 40 mg via INTRAVENOUS
  Filled 2019-09-18 (×2): qty 40

## 2019-09-18 MED ORDER — VANCOMYCIN HCL IN DEXTROSE 1-5 GM/200ML-% IV SOLN
1000.0000 mg | INTRAVENOUS | Status: DC
  Administered 2019-09-18 – 2019-09-22 (×5): 1000 mg via INTRAVENOUS
  Filled 2019-09-18 (×5): qty 200

## 2019-09-18 MED ORDER — HEPARIN SODIUM (PORCINE) 5000 UNIT/ML IJ SOLN
5000.0000 [IU] | Freq: Three times a day (TID) | INTRAMUSCULAR | Status: DC
  Administered 2019-09-19 – 2019-09-22 (×9): 5000 [IU] via SUBCUTANEOUS
  Filled 2019-09-18 (×9): qty 1

## 2019-09-18 MED ORDER — FENTANYL CITRATE (PF) 100 MCG/2ML IJ SOLN
50.0000 ug | Freq: Once | INTRAMUSCULAR | Status: AC
  Administered 2019-09-18: 23:00:00 50 ug via INTRAVENOUS
  Filled 2019-09-18: qty 2

## 2019-09-18 MED ORDER — LACTATED RINGERS IV BOLUS (SEPSIS)
1500.0000 mL | Freq: Once | INTRAVENOUS | Status: AC
  Administered 2019-09-18: 17:00:00 1500 mL via INTRAVENOUS

## 2019-09-18 MED ORDER — ORAL CARE MOUTH RINSE
15.0000 mL | OROMUCOSAL | Status: DC
  Administered 2019-09-19 – 2019-10-01 (×121): 15 mL via OROMUCOSAL

## 2019-09-18 MED ORDER — MIDAZOLAM HCL 2 MG/2ML IJ SOLN
4.0000 mg | Freq: Once | INTRAMUSCULAR | Status: AC
  Administered 2019-09-18: 23:00:00 2 mg via INTRAVENOUS
  Filled 2019-09-18: qty 4

## 2019-09-18 MED ORDER — CHLORHEXIDINE GLUCONATE 0.12% ORAL RINSE (MEDLINE KIT)
15.0000 mL | Freq: Two times a day (BID) | OROMUCOSAL | Status: DC
  Administered 2019-09-19 – 2019-10-01 (×25): 15 mL via OROMUCOSAL

## 2019-09-18 MED ORDER — SODIUM CHLORIDE 0.9 % IV SOLN
1.0000 g | Freq: Once | INTRAVENOUS | Status: AC
  Administered 2019-09-18: 16:00:00 1 g via INTRAVENOUS
  Filled 2019-09-18: qty 10

## 2019-09-18 MED ORDER — DIPHENHYDRAMINE HCL 50 MG/ML IJ SOLN
12.5000 mg | INTRAMUSCULAR | Status: AC
  Administered 2019-09-18: 22:00:00 12.5 mg via INTRAVENOUS

## 2019-09-18 MED ORDER — NOREPINEPHRINE 4 MG/250ML-% IV SOLN
0.0000 ug/min | INTRAVENOUS | Status: DC
  Administered 2019-09-18: 10 ug/min via INTRAVENOUS
  Administered 2019-09-19: 21:00:00 14 ug/min via INTRAVENOUS
  Administered 2019-09-19: 10:00:00 15 ug/min via INTRAVENOUS
  Administered 2019-09-19: 06:00:00 16 ug/min via INTRAVENOUS
  Administered 2019-09-19: 15:00:00 14 ug/min via INTRAVENOUS
  Administered 2019-09-20: 20 ug/min via INTRAVENOUS
  Administered 2019-09-20: 18 ug/min via INTRAVENOUS
  Administered 2019-09-20: 17 ug/min via INTRAVENOUS
  Administered 2019-09-20: 20 ug/min via INTRAVENOUS
  Administered 2019-09-20: 17 ug/min via INTRAVENOUS
  Administered 2019-09-20: 14 ug/min via INTRAVENOUS
  Administered 2019-09-21: 15 ug/min via INTRAVENOUS
  Administered 2019-09-21: 8 ug/min via INTRAVENOUS
  Administered 2019-09-21: 12 ug/min via INTRAVENOUS
  Administered 2019-09-22: 6 ug/min via INTRAVENOUS
  Administered 2019-09-22: 1 ug/min via INTRAVENOUS
  Filled 2019-09-18 (×10): qty 250
  Filled 2019-09-18: qty 500
  Filled 2019-09-18 (×4): qty 250

## 2019-09-18 MED ORDER — SODIUM CHLORIDE 0.9 % IV SOLN
2.0000 g | Freq: Two times a day (BID) | INTRAVENOUS | Status: DC
  Administered 2019-09-18: 23:00:00 2 g via INTRAVENOUS
  Filled 2019-09-18: qty 2

## 2019-09-18 MED ORDER — ROCURONIUM BROMIDE 50 MG/5ML IV SOLN
80.0000 mg | Freq: Once | INTRAVENOUS | Status: AC
  Administered 2019-09-18: 80 mg via INTRAVENOUS

## 2019-09-18 MED ORDER — IOHEXOL 350 MG/ML SOLN
90.0000 mL | Freq: Once | INTRAVENOUS | Status: AC | PRN
  Administered 2019-09-18: 22:00:00 90 mL via INTRAVENOUS

## 2019-09-18 MED ORDER — HYDROCORTISONE NA SUCCINATE PF 100 MG IJ SOLR
100.0000 mg | Freq: Once | INTRAMUSCULAR | Status: AC
  Administered 2019-09-18: 22:00:00 100 mg via INTRAVENOUS
  Filled 2019-09-18: qty 2

## 2019-09-18 MED ORDER — ETOMIDATE 2 MG/ML IV SOLN
15.0000 mg | Freq: Once | INTRAVENOUS | Status: AC
  Administered 2019-09-18: 15 mg via INTRAVENOUS
  Filled 2019-09-18: qty 10

## 2019-09-18 MED ORDER — SODIUM CHLORIDE 0.9 % IV BOLUS
1000.0000 mL | Freq: Once | INTRAVENOUS | Status: AC
  Administered 2019-09-18: 15:00:00 1000 mL via INTRAVENOUS

## 2019-09-18 MED ORDER — DIPHENHYDRAMINE HCL 50 MG/ML IJ SOLN
50.0000 mg | INTRAMUSCULAR | Status: DC
  Filled 2019-09-18: qty 1

## 2019-09-18 MED ORDER — HYDROCORTISONE NA SUCCINATE PF 100 MG IJ SOLR
100.0000 mg | Freq: Once | INTRAMUSCULAR | Status: AC
  Administered 2019-09-18: 15:00:00 100 mg via INTRAVENOUS
  Filled 2019-09-18: qty 2

## 2019-09-18 NOTE — ED Triage Notes (Signed)
Per EMS: pt from home, with C/O worsening AMS and SHOB.  Pt recently discharged from [facility] on 1/25.  Pt was discharged home with oxygen (2L) and is requiring 4L currently.  Pt has labored breathing in triage.  Pt has a HX of COPD and has mild SHOB at baseline.     EMS Vitals: BP 164/110 Temp 99.1 CBG 106 HR 120-130 A-Fib

## 2019-09-18 NOTE — ED Notes (Signed)
Dr. Ralene Bathe notified of troponin 1,534

## 2019-09-18 NOTE — ED Notes (Addendum)
Pt back from CT without incidence 

## 2019-09-18 NOTE — ED Provider Notes (Signed)
Patient care assumed at 1600.  Pt with hx/o COPD on home O2 and recent admission for COVID 19 here for evaluation of AMS, SOB, fever.  He has increased oxygen requirement in the ED.  He was started on rocephin, solucortef.  Labs are pending.    Additional hx available from patients daughter.  Fall at 0100 out of bed.  Nonverbal this morning.  Left leg is weaker than right at baseline.  Only walked three times since hospital discharge. On examination patient is ill appearing, tachypnea. He does open his eyes on verbal commands. He has weakness of his right face, right upper extremity. He does have five out of five grip strength in the left upper extremity. He is nonverbal. He wiggles toes bilaterally. Neurology consulted Neurology to concern for possible CVA given his examination. CTA and perfusion study were obtained, which were negative for acute occlusion. He is not a TPA candidate due to thrombocytopenia as well as duration of symptoms. Critical care consulted for admission.  Discuss with wife, goals of care on recent hospitalization patient was DNR. Wife states that if he expressed wishes to be DNR on his most recent hospitalization she would like to comply with this.   Best number [phone] daughter Wife [phone]   CRITICAL CARE Performed by: Quintella Reichert   Total critical care time: 45 minutes  Critical care time was exclusive of separately billable procedures and treating other patients.  Critical care was necessary to treat or prevent imminent or life-threatening deterioration.  Critical care was time spent personally by me on the following activities: development of treatment plan with patient and/or surrogate as well as nursing, discussions with consultants, evaluation of patient's response to treatment, examination of patient, obtaining history from patient or surrogate, ordering and performing treatments and interventions, ordering and review of laboratory studies, ordering  and review of radiographic studies, pulse oximetry and re-evaluation of patient's condition.      Quintella Reichert, MD 10/11/2019 2328

## 2019-09-18 NOTE — ED Notes (Signed)
Assumed care of pt. Pt on cart, remains tachypneic. MD aware. Remains on continuous monitors. Dr. Ralene Bathe notified of lactate 3.4. No new orders at this time. Will continue to monitor

## 2019-09-18 NOTE — ED Notes (Signed)
CT called about pt going. Per CT, pt will go as soon as possible

## 2019-09-18 NOTE — ED Notes (Signed)
Pt taken to CT on monitors with this RN. Additional PIV initiated in CT, 18G to R forearm. IV flushes with 10 cc NS. Secured with tape and tegaderm. Positive blood return noted.

## 2019-09-18 NOTE — Progress Notes (Addendum)
Pharmacy Antibiotic Note  Chris Lewis is a 79 y.o. male admitted on 10/11/2019 with altered mental status, slight interval worsening of CXR, recent COVID-19 admission.  Pharmacy has been consulted for Vancomycin dosing. Already on cefepime. WBC increased. Acute renal insufficiency. Also with concern for meningitis.  Plan: Vancomycin 1000 mg IV q24h >>Estimated AUC: 513 Already on Cefepime Ampicillin/Flagyl per MD Trend WBC, temp, renal function  F/U infectious work-up Drug levels as indicated   Height: 5\' 8"  (172.7 cm)(Per family) Weight: 176 lb 12.9 oz (80.2 kg) IBW/kg (Calculated) : 68.4  Temp (24hrs), Avg:102.4 F (39.1 C), Min:102.4 F (39.1 C), Max:102.4 F (39.1 C)  Recent Labs  Lab 09/26/2019 1545 09/30/2019 1831  WBC 13.5*  --   CREATININE 1.61*  --   LATICACIDVEN 4.6* 3.4*    Estimated Creatinine Clearance: 35.4 mL/min (A) (by C-G formula based on SCr of 1.61 mg/dL (H)).    Allergies  Allergen Reactions  . Duac [Clindamycin Phos-Benzoyl Perox] Rash  . Contrast Media [Iodinated Diagnostic Agents] Rash    Heavy rash and itching 20 yrs ago. Needs full premeds and does well with this.  . Atorvastatin     muscle pain  . Propoxyphene N-Acetaminophen Itching    rash  . Rosuvastatin     muscle weakness  . Metrizamide Rash    radiopaque contrast agent. Heavy rash and itching 20 yrs ago. Needs full premeds and does well with this.  Orie Fisherman Vegetable Orange [Psyllium]     Heartburn   . Other Rash    High acid food--tomatoes, orange juice  . Tomato     Heartburn    Narda Bonds, PharmD, [place] Clinical Pharmacist Phone: [phone]

## 2019-09-18 NOTE — ED Provider Notes (Addendum)
[facility] EMERGENCY DEPARTMENT Provider Note   CSN: [id number] Arrival date & time: 10/02/2019  1407     History No chief complaint on file.   Chris Lewis is a 79 y.o. male. Level 5 caveat due to altered mental status. HPI Brought in from home for altered mental status and shortness of breath.  Recent admission to the hospital for Covid pneumonia.  Discharged on January 25.  History of severe COPD with chronic shortness of breath.  Discharged on oxygen 2 L but is requiring 4 L now.  Mental status changes patient really cannot provide much history.  Febrile upon arrival.  Moaning, but nonverbal.    Past Medical History:  Diagnosis Date  . Achalasia   . Anxiety    causes shortness  . Arthritis   . COPD (chronic obstructive pulmonary disease) ([place])    with chronic SOB  . Depression   . Dilated aortic root ([place])    3mm by echo 03/2019  . FH: cholecystectomy   . Gallstones   . GERD (gastroesophageal reflux disease)   . Hiatal hernia   . History of alcoholism ([place])   . Hypercholesterolemia   . Hypothyroidism 01/12/2016  . Lesion of right lung 06/04/2013  . MVP (mitral valve prolapse)    with severe MR s/p MV repair  . Osteopenia   . Pneumonia 06/04/2013   aspirated food 14  . RBBB   . S/P right inguinal herniorrhaphy   . Seasonal allergic rhinitis   . Stroke ([place]) 10/2002  . Transient ischemic attack     Patient Active Problem List   Diagnosis Date Noted  . Acute respiratory failure due to COVID-19 ([place]) 08/27/2019  . Left ankle pain 08/29/2018  . Vomiting without nausea 09/14/2017  . Dysphagia 09/14/2017  . Insomnia 07/07/2017  . Acute sinus infection 07/06/2017  . RBBB   . COPD (chronic obstructive pulmonary disease) ([place]) 01/06/2017  . Dilated aortic root ([place])   . Hypothyroidism 01/12/2016  . Tick bite, infected 01/06/2016  . Preventative health care 12/18/2015  . Hyperglycemia 12/18/2015  . Memory dysfunction 12/18/2015  . Rib pain  12/01/2015  . Cough 10/27/2015  . Nodule of right lung 09/10/2013  . H/O respiratory system disease 03/01/2013  . Temporary cerebral vascular dysfunction 03/01/2013  . S/P MVR (mitral valve repair) 08/23/2011  . Achalasia 08/04/2011  . Incidental lung nodule, > 59mm and < 28mm 06/07/2011  . MVP (mitral valve prolapse)   . Transient ischemic attack   . History of back surgery   . Hyperlipidemia 12/19/2009  . Anxiety disorder 12/19/2009  . Depression 12/19/2009  . HYPERTENSION, BENIGN 12/19/2009  . ALLERGIC RHINITIS, SEASONAL 12/19/2009  . Obstructive chronic bronchitis without exacerbation, due to chronic aspiration Gold C 12/19/2009  . Esophageal reflux 12/19/2009    Past Surgical History:  Procedure Laterality Date  . BACK SURGERY     x 3  . BOTOX INJECTION  06/27/2012   Procedure: BOTOX INJECTION;  Surgeon: Garlan Fair, MD;  Location: [facility] ENDOSCOPY;  Service: Endoscopy;  Laterality: N/A;  . CHOLECYSTECTOMY    . ESOPHAGOGASTRODUODENOSCOPY  01/13/2012   Procedure: ESOPHAGOGASTRODUODENOSCOPY (EGD);  Surgeon: Garlan Fair, MD;  Location: [facility];  Service: Endoscopy;  Laterality: N/A;  botox /katrina  . ESOPHAGOGASTRODUODENOSCOPY  06/27/2012   Procedure: ESOPHAGOGASTRODUODENOSCOPY (EGD);  Surgeon: Garlan Fair, MD;  Location: [facility];  Service: Endoscopy;  Laterality: N/A;  . ESOPHAGOGASTRODUODENOSCOPY (EGD) WITH ESOPHAGEAL DILATION N/A 11/22/2012   Procedure: ESOPHAGOGASTRODUODENOSCOPY (EGD)  WITH BOTOX INJECTION;  Surgeon: Garlan Fair, MD;  Location: [facility] ENDOSCOPY;  Service: Endoscopy;  Laterality: N/A;  . Esophagogastroduodenoscopy and Savary esophageal dilation.     x9  . ESOPHAGOSCOPY W/ BOTOX INJECTION     x9 beginning 2008, most recent Sept. 2012  . eyebrow surgery  2012  . FOOT SURGERY Left    screw -fx  . LAPAROSCOPIC HELLER MYOTOMY  July 2014   [facility]  . MITRAL VALVE REPAIR  08/03/2011   Procedure: MINIMALLY INVASIVE MITRAL VALVE REPAIR (MVR);   Surgeon: Rexene Alberts, MD;  Location: [facility];  Service: Open Heart Surgery;  Laterality: Right;  minimally invasive MVR  . rt inguinal herniorrhaphy  1969/1997  . TEE WITHOUT CARDIOVERSION  07/06/2011   Procedure: TRANSESOPHAGEAL ECHOCARDIOGRAM (TEE);  Surgeon: Sueanne Margarita, MD;  Location: [facility] ENDOS[facility];  Service: Cardiovascular;  Laterality: N/A;  . VIDEO BRONCHOSCOPY WITH ENDOBRONCHIAL NAVIGATION N/A 04/03/2014   Procedure: VIDEO BRONCHOSCOPY WITH ENDOBRONCHIAL NAVIGATION;  Surgeon: Collene Gobble, MD;  Location: [facility] OR;  Service: Thoracic;  Laterality: N/A;       Family History  Problem Relation Age of Onset  . Stroke Father 9       cva  . Scleroderma Mother 1  . Lung cancer Sister   . Cancer Sister   . Heart disease Sister   . Esophageal cancer Other        nephew  . Prostate cancer Paternal Uncle   . Anesthesia problems Neg Hx   . Colon cancer Neg Hx   . Liver cancer Neg Hx     Social History   Tobacco Use  . Smoking status: Former Smoker    Packs/day: 1.50    Years: 38.00    Pack years: 57.00    Types: Cigarettes    Quit date: 08/29/1997    Years since quitting: 22.0  . Smokeless tobacco: Never Used  Substance Use Topics  . Alcohol use: No  . Drug use: No    Home Medications Prior to Admission medications   Medication Sig Start Date End Date Taking? Authorizing Provider  albuterol (VENTOLIN HFA) 108 (90 Base) MCG/ACT inhaler INHALE 2 PUFFS BY MOUTH EVERY 4 HOURS AS NEEDED FOR WHEEZE Patient taking differently: Inhale 2 puffs into the lungs every 4 (four) hours as needed for wheezing.  05/22/19   Parrett, Fonnie Mu, NP  azelastine (ASTELIN) 0.1 % nasal spray Place 2 sprays into both nostrils at bedtime. Use in each nostril as directed 01/17/18   Parrett, Fonnie Mu, NP  calcium carbonate (TUMS EX) 750 MG chewable tablet Chew 1 tablet by mouth as needed (for gas and reflux).     [provider]  cetirizine (ZYRTEC) 10 MG tablet Take 1 tablet (10 mg total) by  mouth daily as needed for allergies. Reported on 10/27/2015 01/06/17   Biagio Borg, MD  Cholecalciferol (VITAMIN D3) 1000 UNITS CAPS Take 1,000 Units by mouth daily. Reported on 09/16/2015    [provider]  clonazePAM (KLONOPIN) 0.5 MG tablet take 1 tablet by mouth twice a day if needed Patient taking differently: Take 0.5 mg by mouth 2 (two) times daily as needed for anxiety.  05/28/19   Biagio Borg, MD  diclofenac sodium (VOLTAREN) 1 % GEL Apply 2 g topically 4 (four) times daily. 08/29/18   Biagio Borg, MD  escitalopram (LEXAPRO) 10 MG tablet TAKE 1 TABLET BY MOUTH EVERY DAY Patient taking differently: Take 10 mg by mouth daily.  11/27/18  Biagio Borg, MD  hydrocortisone (CORTEF) 20 MG tablet Take 2 tablets twice daily for 4 days, followed by 1 tablet twice daily for 4 days, followed by 1 tablet once daily for 4 days, then STOP. 09/10/19   Bonnielee Haff, MD  levothyroxine (SYNTHROID, LEVOTHROID) 25 MCG tablet TAKE 1 TABLET BY MOUTH EVERY DAY BEFORE BREAKFAST Patient taking differently: Take 25 mcg by mouth daily before breakfast.  11/27/18   Biagio Borg, MD  metroNIDAZOLE (METROGEL) 0.75 % gel Apply 1 application topically 2 (two) times daily.    [provider]  pantoprazole (PROTONIX) 40 MG tablet Take 40 mg by mouth daily. 05/11/19   [provider]  tiotropium (SPIRIVA HANDIHALER) 18 MCG inhalation capsule PLACE 1 CAPSULE INTO INHALER AND INHALE DAILY Patient taking differently: Place 18 mcg into inhaler and inhale daily.  08/03/19   Parrett, Fonnie Mu, NP  traMADol (ULTRAM) 50 MG tablet Take 1 tablet (50 mg total) by mouth every 12 (twelve) hours as needed. Patient taking differently: Take 50 mg by mouth every 12 (twelve) hours as needed for moderate pain.  05/28/19   Biagio Borg, MD    Allergies    Duac [clindamycin phos-benzoyl perox], Contrast media [iodinated diagnostic agents], Atorvastatin, Propoxyphene n-acetaminophen, Rosuvastatin, Metrizamide,  Natural vegetable orange [psyllium], Other, and Tomato  Review of Systems   Review of Systems  Unable to perform ROS: Mental status change    Physical Exam Updated Vital Signs BP 111/76   Pulse (!) 108   Temp (!) 102.4 F (39.1 C) (Rectal)   Resp (!) 40   SpO2 98%   Physical Exam Vitals and nursing note reviewed.  Eyes:     Extraocular Movements: Extraocular movements intact.  Cardiovascular:     Rate and Rhythm: Tachycardia present.  Pulmonary:     Comments: Mildly harsh breath sounds. Abdominal:     Tenderness: There is abdominal tenderness.     Comments: Mild abdominal tenderness.  Had diarrhea after rectal temperature.  Musculoskeletal:        General: No tenderness.  Skin:    General: Skin is warm.     Capillary Refill: Capillary refill takes less than 2 seconds.  Neurological:     Comments: Appears to be moving all extremities.  Moaning.  Opens eyes to voice but would not follow commands.     ED Results / Procedures / Treatments   Labs (all labs ordered are listed, but only abnormal results are displayed) Labs Reviewed  LACTIC ACID, PLASMA - Abnormal; Notable for the following components:      Result Value   Lactic Acid, Venous 4.6 (*)    All other components within normal limits  CBC WITH DIFFERENTIAL/PLATELET - Abnormal; Notable for the following components:   WBC 13.5 (*)    RDW 15.9 (*)    Platelets 56 (*)    Neutro Abs 12.5 (*)    Lymphs Abs 0.4 (*)    Abs Immature Granulocytes 0.13 (*)    All other components within normal limits  URINE CULTURE  CULTURE, BLOOD (ROUTINE X 2)  CULTURE, BLOOD (ROUTINE X 2)  COMPREHENSIVE METABOLIC PANEL  LACTIC ACID, PLASMA  TSH  D-DIMER, QUANTITATIVE (NOT AT [facility])  PROCALCITONIN  LACTATE DEHYDROGENASE  FERRITIN  TRIGLYCERIDES  FIBRINOGEN  C-REACTIVE PROTEIN  BRAIN NATRIURETIC PEPTIDE  PROTIME-INR  URINALYSIS, ROUTINE W REFLEX MICROSCOPIC  TROPONIN I (HIGH SENSITIVITY)    EKG EKG  Interpretation  Date/Time:  Tuesday September 18 2019 15:04:27 EST Ventricular Rate:  109 PR Interval:    QRS Duration: 132 QT Interval:  299 QTC Calculation: 403 R Axis:   76 Text Interpretation: Sinus tachycardia Atrial premature complex Right bundle branch block Confirmed by Davonna Belling [phone]) on 10/13/2019 3:29:10 PM   Radiology DG Chest Portable 1 View  Result Date: 09/23/2019 CLINICAL DATA:  Fever EXAM: PORTABLE CHEST 1 VIEW COMPARISON:  September 06, 2019 FINDINGS: The heart size and mediastinal contours are unchanged. Aortic knob calcifications. Slight interval increase in the patchy airspace opacity seen at both lung bases. There is increased interstitial markings seen at both lung bases. Hyperinflation of the upper lung zones is noted. No acute osseous abnormality. IMPRESSION: Slight interval increase in the patchy/interstitial markings at both lung bases which could be due to atelectasis and/or infectious etiology. Electronically Signed   By: Prudencio Pair M.D.   On: 10/13/2019 15:29    Procedures Procedures (including critical care time)  Medications Ordered in ED Medications  cefTRIAXone (ROCEPHIN) 1 g in sodium chloride 0.9 % 100 mL IVPB (1 g Intravenous New Bag/Given 09/30/2019 1621)  lactated ringers bolus 1,500 mL (has no administration in time range)  sodium chloride 0.9 % bolus 1,000 mL (1,000 mLs Intravenous New Bag/Given 10/11/2019 1522)  hydrocortisone sodium succinate (SOLU-CORTEF) 100 MG injection 100 mg (100 mg Intravenous Given 10/04/2019 1522)    ED Course  I have reviewed the triage vital signs and the nursing notes.  Pertinent labs & imaging results that were available during my care of the patient were reviewed by me and considered in my medical decision making (see chart for details).    MDM Rules/Calculators/A&P                      Patient presents with fever.  Recent admission for Covid.  X-ray shows worsening infiltrates.  Will treat with Rocephin at this  time.  Chronic oxygen.  Given hydrocortisone since hypotension while in hospital thought may be due to steroid withdrawal.  Lab work was difficult to draw and care turned over to Dr. Ralene Bathe.  Lactic acid now returned greater than 4.  Code sepsis activated.  Will bolus up to 30/kg.  CRITICAL CARE Performed by: Davonna Belling Total critical care time: 30 minutes Critical care time was exclusive of separately billable procedures and treating other patients. Critical care was necessary to treat or prevent imminent or life-threatening deterioration. Critical care was time spent personally by me on the following activities: development of treatment plan with patient and/or surrogate as well as nursing, discussions with consultants, evaluation of patient's response to treatment, examination of patient, obtaining history from patient or surrogate, ordering and performing treatments and interventions, ordering and review of laboratory studies, ordering and review of radiographic studies, pulse oximetry and re-evaluation of patient's condition.  Final Clinical Impression(s) / ED Diagnoses Final diagnoses:  Fever, unspecified fever cause  Pneumonia due to infectious organism, unspecified laterality, unspecified part of lung    Rx / DC Orders ED Discharge Orders    None       Davonna Belling, MD 10/04/2019 [place], Neev Mcmains, MD 09/22/2019 [phone]

## 2019-09-18 NOTE — ED Notes (Signed)
CT called by Dr. Ralene Bathe

## 2019-09-18 NOTE — ED Notes (Addendum)
Pt back from CT without incidence. Cefepime given per MAR. Urine collected, labeled with 2 pt identifiers, and sent to lab

## 2019-09-18 NOTE — ED Notes (Signed)
Pt taken to CT scan.

## 2019-09-18 NOTE — Progress Notes (Signed)
Pharmacy Antibiotic Note  Chris Lewis is a 79 y.o. male admitted on 09/30/2019 with recent [facility] admission, X-ray with worsening infiltrates.  Pharmacy has been consulted for Cefepime dosing.  LA >4, WBC 13.5  Plan: Cefepime 2g IV every 12 hours Monitor renal function, clinical progression and LOT  Height: 5\' 8"  (172.7 cm)(Per family) Weight: 176 lb 12.9 oz (80.2 kg) IBW/kg (Calculated) : 68.4  Temp (24hrs), Avg:102.4 F (39.1 C), Min:102.4 F (39.1 C), Max:102.4 F (39.1 C)  Recent Labs  Lab 09/19/2019 1545  WBC 13.5*  CREATININE 1.61*  LATICACIDVEN 4.6*    Estimated Creatinine Clearance: 35.4 mL/min (A) (by C-G formula based on SCr of 1.61 mg/dL (H)).    Allergies  Allergen Reactions  . Duac [Clindamycin Phos-Benzoyl Perox] Rash  . Contrast Media [Iodinated Diagnostic Agents] Rash    Heavy rash and itching 20 yrs ago. Needs full premeds and does well with this.  . Atorvastatin     muscle pain  . Propoxyphene N-Acetaminophen Itching    rash  . Rosuvastatin     muscle weakness  . Metrizamide Rash    radiopaque contrast agent. Heavy rash and itching 20 yrs ago. Needs full premeds and does well with this.  Orie Fisherman Vegetable Orange [Psyllium]     Heartburn   . Other Rash    High acid food--tomatoes, orange juice  . Tomato     Heartburn    Bertis Ruddy, PharmD Clinical Pharmacist Please check AMION for all [place] numbers 09/26/2019 4:54 PM

## 2019-09-18 NOTE — ED Notes (Signed)
Admitting at bedside 

## 2019-09-18 NOTE — ED Notes (Signed)
PIV initiated, 20G to L forearm. IV flushes with 10 cc NS without s/s of infiltration. Secured with tape and tegaderm. Positive blood return noted.

## 2019-09-18 NOTE — ED Notes (Signed)
Requested phlebotomies assistance in obtaining the labs for this patient.  This RN has tried and was only able to send a few.  Magda Paganini RN has also attempted without success.

## 2019-09-18 NOTE — Consult Note (Signed)
Neurology Consultation Reason for Consult: Right-sided weakness Referring Physician: Ralene Bathe, E  CC: Right-sided weakness  History is obtained from: Wife, chart  HPI: Chris Lewis is a 79 y.o. male recently admitted with Covid who was in his normal post admission state of health when he went to bed last night.  He fell out of bed sometime in the morning, but was not clear that he was that confused.  Progressively over the next 12 hours he has progressively become more confused.  There was concern for possible aphasia/right-sided weakness and therefore a CT A/P was obtained which is negative.  Neurology has been consulted for further recommendations.   LKW: 2/1 tpa given?: no, outside of window   ROS: Unable to obtain due to altered mental status.   Past Medical History:  Diagnosis Date  . Achalasia   . Anxiety    causes shortness  . Arthritis   . COPD (chronic obstructive pulmonary disease) ([place])    with chronic SOB  . Depression   . Dilated aortic root ([place])    51mm by echo 03/2019  . FH: cholecystectomy   . Gallstones   . GERD (gastroesophageal reflux disease)   . Hiatal hernia   . History of alcoholism ([place])   . Hypercholesterolemia   . Hypothyroidism 01/12/2016  . Lesion of right lung 06/04/2013  . MVP (mitral valve prolapse)    with severe MR s/p MV repair  . Osteopenia   . Pneumonia 06/04/2013   aspirated food 14  . RBBB   . S/P right inguinal herniorrhaphy   . Seasonal allergic rhinitis   . Stroke ([place]) 10/2002  . Transient ischemic attack      Family History  Problem Relation Age of Onset  . Stroke Father 30       cva  . Scleroderma Mother 56  . Lung cancer Sister   . Cancer Sister   . Heart disease Sister   . Esophageal cancer Other        nephew  . Prostate cancer Paternal Uncle   . Anesthesia problems Neg Hx   . Colon cancer Neg Hx   . Liver cancer Neg Hx      Social History:  reports that he quit smoking about 22 years ago. His smoking use  included cigarettes. He has a 57.00 pack-year smoking history. He has never used smokeless tobacco. He reports that he does not drink alcohol or use drugs.   Exam: Current vital signs: BP 106/66   Pulse (!) 113   Temp (!) 102.4 F (39.1 C) (Rectal)   Resp (!) 37   Ht 5\' 8"  (1.727 m) Comment: Per family  Wt 80.2 kg   SpO2 96%   BMI 26.88 kg/m  Vital signs in last 24 hours: Temp:  [102.4 F (39.1 C)] 102.4 F (39.1 C) (02/02 1420) Pulse Rate:  [105-114] 113 (02/02 1830) Resp:  [23-44] 37 (02/02 1830) BP: (98-126)/(65-92) 106/66 (02/02 1830) SpO2:  [94 %-100 %] 96 % (02/02 1830) Weight:  [80.2 kg] 80.2 kg (02/02 1640)   Physical Exam  Constitutional: Appears well-developed and well-nourished.  Psych: Does not interact Eyes: No scleral injection HENT: No OP obstrucion MSK: no joint deformities.  Cardiovascular: Tachycardic Respiratory: Labored rapid breathing GI: Soft.  No distension. There is no tenderness.  Skin: WDI  Neuro: Mental Status: Patient is lethargic, does not follow commands or open eyes Cranial Nerves: II: He does not blink to threat pupils are equal, round, and reactive to light.  III,IV, VI: Eyes are midline, he resists doll's eye maneuver V: VII: He does appear to have some mild decreased right nasolabial fold Motor: He is able to lift his right arm against gravity, but he does appear to move it less than his left, his right leg is held in eversion, but he does withdrawal to noxious stimulation bilaterally. Sensory: Response to noxious stimulation in all 4 extremities Cerebellar: He does not perform  + Neck stiffness   I have reviewed labs in epic and the results pertinent to this consultation are: CRP of 18, greater than his discharge CRP of 1.4 on 1/15 Creatinine of 1.  6 1, increased from 1.01 on 1/25  I have reviewed the images obtained: CT/CTP-negative  Impression: Acute encephalopathy in the setting of recent Covid pneumonia several  weeks after initial onset.  He does have a stiff neck which coupled with his elevated procalcitonin does bring the possibility of a bacterial meningitis as a possibility of his rapidly worsening mental status.   There is a question of focal deficits on exam, and the stroke may be possible, I am not convinced that this would explain his overall picture.  His platelets are 56 and his INR is elevated at 1.3.  Though both are barely within the areas where an LP could be considered, with both of them being abnormal, I would be hesitant to tap emergently.  I think it would be lower risk to do broad-spectrum antibiotics overnight and consider platelet transfusion with LP under fluoroscopy to minimize traumatic risk.  This may limit utility of culture, but a pleocytosis would still be useful information.  Recommendations: 1) bacterial CNS coverage, discussed with CCM, vanc, cefepime, ampicillin 2) consider platelets with lumbar puncture 3) MRI brain 4) EEG 5) neurology will continue to follow  Roland Rack, MD [facility] [phone]  If 7pm- 7am, please page neurology on call as listed in [place].

## 2019-09-18 NOTE — ED Notes (Signed)
Care endorsed to Jessica, RN

## 2019-09-19 ENCOUNTER — Inpatient Hospital Stay (HOSPITAL_COMMUNITY): Payer: BC Managed Care – PPO

## 2019-09-19 DIAGNOSIS — R6521 Severe sepsis with septic shock: Secondary | ICD-10-CM | POA: Diagnosis not present

## 2019-09-19 DIAGNOSIS — N390 Urinary tract infection, site not specified: Secondary | ICD-10-CM | POA: Diagnosis not present

## 2019-09-19 DIAGNOSIS — N179 Acute kidney failure, unspecified: Secondary | ICD-10-CM | POA: Diagnosis not present

## 2019-09-19 DIAGNOSIS — J9601 Acute respiratory failure with hypoxia: Secondary | ICD-10-CM | POA: Diagnosis not present

## 2019-09-19 DIAGNOSIS — I214 Non-ST elevation (NSTEMI) myocardial infarction: Secondary | ICD-10-CM | POA: Diagnosis not present

## 2019-09-19 DIAGNOSIS — I5041 Acute combined systolic (congestive) and diastolic (congestive) heart failure: Secondary | ICD-10-CM | POA: Diagnosis not present

## 2019-09-19 DIAGNOSIS — J189 Pneumonia, unspecified organism: Secondary | ICD-10-CM | POA: Diagnosis not present

## 2019-09-19 DIAGNOSIS — G9340 Encephalopathy, unspecified: Secondary | ICD-10-CM

## 2019-09-19 DIAGNOSIS — G039 Meningitis, unspecified: Secondary | ICD-10-CM | POA: Diagnosis not present

## 2019-09-19 DIAGNOSIS — I6310 Cerebral infarction due to embolism of unspecified precerebral artery: Secondary | ICD-10-CM | POA: Diagnosis not present

## 2019-09-19 DIAGNOSIS — R579 Shock, unspecified: Secondary | ICD-10-CM | POA: Diagnosis not present

## 2019-09-19 DIAGNOSIS — A4102 Sepsis due to Methicillin resistant Staphylococcus aureus: Secondary | ICD-10-CM | POA: Diagnosis not present

## 2019-09-19 DIAGNOSIS — G9341 Metabolic encephalopathy: Secondary | ICD-10-CM | POA: Diagnosis not present

## 2019-09-19 DIAGNOSIS — I639 Cerebral infarction, unspecified: Secondary | ICD-10-CM | POA: Diagnosis not present

## 2019-09-19 DIAGNOSIS — R4182 Altered mental status, unspecified: Secondary | ICD-10-CM | POA: Diagnosis not present

## 2019-09-19 DIAGNOSIS — I2609 Other pulmonary embolism with acute cor pulmonale: Secondary | ICD-10-CM | POA: Diagnosis not present

## 2019-09-19 DIAGNOSIS — R509 Fever, unspecified: Secondary | ICD-10-CM | POA: Diagnosis not present

## 2019-09-19 DIAGNOSIS — I50811 Acute right heart failure: Secondary | ICD-10-CM | POA: Diagnosis not present

## 2019-09-19 DIAGNOSIS — A4902 Methicillin resistant Staphylococcus aureus infection, unspecified site: Secondary | ICD-10-CM | POA: Diagnosis not present

## 2019-09-19 DIAGNOSIS — U071 COVID-19: Secondary | ICD-10-CM | POA: Diagnosis not present

## 2019-09-19 DIAGNOSIS — L8990 Pressure ulcer of unspecified site, unspecified stage: Secondary | ICD-10-CM | POA: Insufficient documentation

## 2019-09-19 DIAGNOSIS — A419 Sepsis, unspecified organism: Secondary | ICD-10-CM | POA: Diagnosis not present

## 2019-09-19 DIAGNOSIS — Z9911 Dependence on respirator [ventilator] status: Secondary | ICD-10-CM | POA: Diagnosis not present

## 2019-09-19 DIAGNOSIS — Z452 Encounter for adjustment and management of vascular access device: Secondary | ICD-10-CM | POA: Diagnosis not present

## 2019-09-19 DIAGNOSIS — R739 Hyperglycemia, unspecified: Secondary | ICD-10-CM | POA: Diagnosis not present

## 2019-09-19 LAB — COMPREHENSIVE METABOLIC PANEL
ALT: 39 U/L (ref 0–44)
AST: 54 U/L — ABNORMAL HIGH (ref 15–41)
Albumin: 2 g/dL — ABNORMAL LOW (ref 3.5–5.0)
Alkaline Phosphatase: 120 U/L (ref 38–126)
Anion gap: 14 (ref 5–15)
BUN: 30 mg/dL — ABNORMAL HIGH (ref 8–23)
CO2: 20 mmol/L — ABNORMAL LOW (ref 22–32)
Calcium: 7.1 mg/dL — ABNORMAL LOW (ref 8.9–10.3)
Chloride: 103 mmol/L (ref 98–111)
Creatinine, Ser: 1.83 mg/dL — ABNORMAL HIGH (ref 0.61–1.24)
GFR calc Af Amer: 40 mL/min — ABNORMAL LOW (ref >60)
GFR calc non Af Amer: 34 mL/min — ABNORMAL LOW (ref >60)
Glucose, Bld: 172 mg/dL — ABNORMAL HIGH (ref 70–99)
Potassium: 4.7 mmol/L (ref 3.5–5.1)
Sodium: 137 mmol/L (ref 135–145)
Total Bilirubin: 0.8 mg/dL (ref 0.3–1.2)
Total Protein: 4.8 g/dL — ABNORMAL LOW (ref 6.5–8.1)

## 2019-09-19 LAB — CSF CELL COUNT WITH DIFFERENTIAL
Eosinophils, CSF: 0 % (ref 0–1)
Eosinophils, CSF: 0 % (ref 0–1)
Lymphs, CSF: 3 % — ABNORMAL LOW (ref 40–80)
Lymphs, CSF: 4 % — ABNORMAL LOW (ref 40–80)
Monocyte-Macrophage-Spinal Fluid: 0 % — ABNORMAL LOW (ref 15–45)
Monocyte-Macrophage-Spinal Fluid: 0 % — ABNORMAL LOW (ref 15–45)
RBC Count, CSF: 78 /mm3 — ABNORMAL HIGH
RBC Count, CSF: 98 /mm3 — ABNORMAL HIGH
Segmented Neutrophils-CSF: 96 % — ABNORMAL HIGH (ref 0–6)
Segmented Neutrophils-CSF: 97 % — ABNORMAL HIGH (ref 0–6)
Tube #: 1
Tube #: 4
WBC, CSF: 36 /mm3 (ref 0–5)
WBC, CSF: 49 /mm3 (ref 0–5)

## 2019-09-19 LAB — POCT I-STAT 7, (LYTES, BLD GAS, ICA,H+H)
Acid-base deficit: 5 mmol/L — ABNORMAL HIGH (ref 0.0–2.0)
Acid-base deficit: 5 mmol/L — ABNORMAL HIGH (ref 0.0–2.0)
Bicarbonate: 19.8 mmol/L — ABNORMAL LOW (ref 20.0–28.0)
Bicarbonate: 22.1 mmol/L (ref 20.0–28.0)
Calcium, Ion: 1.1 mmol/L — ABNORMAL LOW (ref 1.15–1.40)
Calcium, Ion: 1.11 mmol/L — ABNORMAL LOW (ref 1.15–1.40)
HCT: 37 % — ABNORMAL LOW (ref 39.0–52.0)
HCT: 39 % (ref 39.0–52.0)
Hemoglobin: 12.6 g/dL — ABNORMAL LOW (ref 13.0–17.0)
Hemoglobin: 13.3 g/dL (ref 13.0–17.0)
O2 Saturation: 100 %
O2 Saturation: 96 %
Patient temperature: 100
Patient temperature: 102.8
Potassium: 4 mmol/L (ref 3.5–5.1)
Potassium: 4 mmol/L (ref 3.5–5.1)
Sodium: 135 mmol/L (ref 135–145)
Sodium: 135 mmol/L (ref 135–145)
TCO2: 21 mmol/L — ABNORMAL LOW (ref 22–32)
TCO2: 23 mmol/L (ref 22–32)
pCO2 arterial: 39.9 mmHg (ref 32.0–48.0)
pCO2 arterial: 49.2 mmHg — ABNORMAL HIGH (ref 32.0–48.0)
pH, Arterial: 7.263 — ABNORMAL LOW (ref 7.350–7.450)
pH, Arterial: 7.314 — ABNORMAL LOW (ref 7.350–7.450)
pO2, Arterial: 102 mmHg (ref 83.0–108.0)
pO2, Arterial: 245 mmHg — ABNORMAL HIGH (ref 83.0–108.0)

## 2019-09-19 LAB — STREP PNEUMONIAE URINARY ANTIGEN: Strep Pneumo Urinary Antigen: NEGATIVE

## 2019-09-19 LAB — CBC
HCT: 43.6 % (ref 39.0–52.0)
HCT: 38.7 % — ABNORMAL LOW (ref 39.0–52.0)
Hemoglobin: 14.1 g/dL (ref 13.0–17.0)
Hemoglobin: 12.4 g/dL — ABNORMAL LOW (ref 13.0–17.0)
MCH: 30.9 pg (ref 26.0–34.0)
MCH: 30.2 pg (ref 26.0–34.0)
MCHC: 32.3 g/dL (ref 30.0–36.0)
MCHC: 32 g/dL (ref 30.0–36.0)
MCV: 95.4 fL (ref 80.0–100.0)
MCV: 94.4 fL (ref 80.0–100.0)
Platelets: 69 10*3/uL — ABNORMAL LOW (ref 150–400)
Platelets: 120 10*3/uL — ABNORMAL LOW (ref 150–400)
RBC: 4.57 MIL/uL (ref 4.22–5.81)
RBC: 4.1 MIL/uL — ABNORMAL LOW (ref 4.22–5.81)
RDW: 16.1 % — ABNORMAL HIGH (ref 11.5–15.5)
RDW: 16.1 % — ABNORMAL HIGH (ref 11.5–15.5)
WBC: 19.3 10*3/uL — ABNORMAL HIGH (ref 4.0–10.5)
WBC: 26.6 10*3/uL — ABNORMAL HIGH (ref 4.0–10.5)
nRBC: 0 % (ref 0.0–0.2)
nRBC: 0 % (ref 0.0–0.2)

## 2019-09-19 LAB — TYPE AND SCREEN
ABO/RH(D): A POS
Antibody Screen: NEGATIVE

## 2019-09-19 LAB — DIC (DISSEMINATED INTRAVASCULAR COAGULATION)PANEL
D-Dimer, Quant: 16.62 ug/mL-FEU — ABNORMAL HIGH (ref 0.00–0.50)
Fibrinogen: 422 mg/dL (ref 210–475)
INR: 1.3 — ABNORMAL HIGH (ref 0.8–1.2)
Platelets: 120 10*3/uL — ABNORMAL LOW (ref 150–400)
Prothrombin Time: 16.2 seconds — ABNORMAL HIGH (ref 11.4–15.2)
Smear Review: NONE SEEN
aPTT: 32 seconds (ref 24–36)

## 2019-09-19 LAB — CREATININE, SERUM
Creatinine, Ser: 1.49 mg/dL — ABNORMAL HIGH (ref 0.61–1.24)
GFR calc Af Amer: 51 mL/min — ABNORMAL LOW (ref >60)
GFR calc non Af Amer: 44 mL/min — ABNORMAL LOW (ref >60)

## 2019-09-19 LAB — GLUCOSE, CAPILLARY
Glucose-Capillary: 205 mg/dL — ABNORMAL HIGH (ref 70–99)
Glucose-Capillary: 153 mg/dL — ABNORMAL HIGH (ref 70–99)
Glucose-Capillary: 205 mg/dL — ABNORMAL HIGH (ref 70–99)

## 2019-09-19 LAB — MAGNESIUM: Magnesium: 1.9 mg/dL (ref 1.7–2.4)

## 2019-09-19 LAB — PROTEIN AND GLUCOSE, CSF
Glucose, CSF: 97 mg/dL — ABNORMAL HIGH (ref 40–70)
Total  Protein, CSF: 48 mg/dL — ABNORMAL HIGH (ref 15–45)

## 2019-09-19 LAB — LACTIC ACID, PLASMA: Lactic Acid, Venous: 2.9 mmol/L (ref 0.5–1.9)

## 2019-09-19 LAB — INFLUENZA PANEL BY PCR (TYPE A & B)
Influenza A By PCR: NEGATIVE
Influenza B By PCR: NEGATIVE

## 2019-09-19 LAB — MRSA PCR SCREENING: MRSA by PCR: POSITIVE — AB

## 2019-09-19 LAB — TROPONIN I (HIGH SENSITIVITY)
Troponin I (High Sensitivity): 1285 ng/L (ref <18)
Troponin I (High Sensitivity): 1476 ng/L (ref <18)

## 2019-09-19 LAB — PHOSPHORUS: Phosphorus: 4.6 mg/dL (ref 2.5–4.6)

## 2019-09-19 MED ORDER — DEXMEDETOMIDINE HCL IN NACL 400 MCG/100ML IV SOLN
0.4000 ug/kg/h | INTRAVENOUS | Status: DC
  Administered 2019-09-19: 03:00:00 0.4 ug/kg/h via INTRAVENOUS
  Filled 2019-09-19: qty 100

## 2019-09-19 MED ORDER — CHLORHEXIDINE GLUCONATE CLOTH 2 % EX PADS
6.0000 | MEDICATED_PAD | Freq: Every day | CUTANEOUS | Status: AC
  Administered 2019-09-20 – 2019-09-23 (×4): 6 via TOPICAL

## 2019-09-19 MED ORDER — MIDAZOLAM BOLUS VIA INFUSION
1.0000 mg | INTRAVENOUS | Status: DC | PRN
  Filled 2019-09-19: qty 2

## 2019-09-19 MED ORDER — FENTANYL 2500MCG IN NS 250ML (10MCG/ML) PREMIX INFUSION: 0.0000 ug/h | INTRAVENOUS | Status: DC

## 2019-09-19 MED ORDER — SODIUM CHLORIDE 0.9% IV SOLUTION: Freq: Once | INTRAVENOUS | Status: AC

## 2019-09-19 MED ORDER — PHENYLEPHRINE HCL-NACL 10-0.9 MG/250ML-% IV SOLN: 0.0000 ug/min | INTRAVENOUS | Status: DC

## 2019-09-19 MED ORDER — LEVOTHYROXINE SODIUM 25 MCG PO TABS
25.0000 ug | ORAL_TABLET | Freq: Every day | ORAL | Status: DC
  Administered 2019-09-19 – 2019-10-01 (×13): 25 ug
  Filled 2019-09-19 (×13): qty 1

## 2019-09-19 MED ORDER — PIPERACILLIN-TAZOBACTAM 3.375 G IVPB
3.3750 g | Freq: Three times a day (TID) | INTRAVENOUS | Status: DC
  Administered 2019-09-19: 01:00:00 3.375 g via INTRAVENOUS
  Filled 2019-09-19: qty 50

## 2019-09-19 MED ORDER — METRONIDAZOLE IN NACL 5-0.79 MG/ML-% IV SOLN
500.0000 mg | Freq: Three times a day (TID) | INTRAVENOUS | Status: DC
  Administered 2019-09-19 – 2019-09-21 (×8): 500 mg via INTRAVENOUS
  Filled 2019-09-19 (×8): qty 100

## 2019-09-19 MED ORDER — FENTANYL 2500MCG IN NS 250ML (10MCG/ML) PREMIX INFUSION
25.0000 ug/h | INTRAVENOUS | Status: DC
  Administered 2019-09-19: 09:00:00 50 ug/h via INTRAVENOUS
  Administered 2019-09-21: 25 ug/h via INTRAVENOUS
  Administered 2019-09-22 – 2019-09-23 (×2): 125 ug/h via INTRAVENOUS
  Administered 2019-09-24 – 2019-09-25 (×2): 150 ug/h via INTRAVENOUS
  Administered 2019-09-25: 100 ug/h via INTRAVENOUS
  Administered 2019-09-26: 50 ug/h via INTRAVENOUS
  Administered 2019-09-26 (×2): 100 ug/h via INTRAVENOUS
  Administered 2019-09-27: 200 ug/h via INTRAVENOUS
  Administered 2019-09-28: 250 ug/h via INTRAVENOUS
  Administered 2019-09-28: 200 ug/h via INTRAVENOUS
  Administered 2019-09-28: 250 ug/h via INTRAVENOUS
  Administered 2019-09-29: 50 ug/h via INTRAVENOUS
  Administered 2019-10-01: 200 ug/h via INTRAVENOUS
  Filled 2019-09-19 (×14): qty 250

## 2019-09-19 MED ORDER — FENTANYL BOLUS VIA INFUSION
25.0000 ug | INTRAVENOUS | Status: DC | PRN
  Administered 2019-09-23 – 2019-09-30 (×16): 25 ug via INTRAVENOUS
  Filled 2019-09-19: qty 25

## 2019-09-19 MED ORDER — FREE WATER
200.0000 mL | Freq: Four times a day (QID) | Status: DC
  Administered 2019-09-19 – 2019-09-22 (×11): 200 mL

## 2019-09-19 MED ORDER — FENTANYL CITRATE (PF) 100 MCG/2ML IJ SOLN
50.0000 ug | INTRAMUSCULAR | Status: DC | PRN
  Administered 2019-09-19 – 2019-09-28 (×7): 50 ug via INTRAVENOUS
  Filled 2019-09-19: qty 2

## 2019-09-19 MED ORDER — MIDAZOLAM 50MG/50ML (1MG/ML) PREMIX INFUSION
0.0000 mg/h | INTRAVENOUS | Status: DC
  Administered 2019-09-19 (×2): 2 mg/h via INTRAVENOUS
  Filled 2019-09-19: qty 50

## 2019-09-19 MED ORDER — CHLORHEXIDINE GLUCONATE CLOTH 2 % EX PADS
6.0000 | MEDICATED_PAD | Freq: Every day | CUTANEOUS | Status: DC
  Administered 2019-09-19: 06:00:00 6 via TOPICAL

## 2019-09-19 MED ORDER — SODIUM CHLORIDE 0.9 % IV SOLN
2.0000 g | Freq: Four times a day (QID) | INTRAVENOUS | Status: DC
  Administered 2019-09-19 – 2019-09-21 (×10): 2 g via INTRAVENOUS
  Filled 2019-09-19: qty 2
  Filled 2019-09-19 (×2): qty 2000
  Filled 2019-09-19 (×2): qty 2
  Filled 2019-09-19 (×6): qty 2000
  Filled 2019-09-19: qty 2
  Filled 2019-09-19 (×2): qty 2000

## 2019-09-19 MED ORDER — PRO-STAT SUGAR FREE PO LIQD
30.0000 mL | Freq: Two times a day (BID) | ORAL | Status: DC
  Administered 2019-09-19 – 2019-10-01 (×25): 30 mL
  Filled 2019-09-19 (×25): qty 30

## 2019-09-19 MED ORDER — SODIUM CHLORIDE 0.9 % IV SOLN
2.0000 g | Freq: Two times a day (BID) | INTRAVENOUS | Status: DC
  Administered 2019-09-19 – 2019-09-21 (×5): 2 g via INTRAVENOUS
  Filled 2019-09-19 (×5): qty 2

## 2019-09-19 MED ORDER — MIDAZOLAM 50MG/50ML (1MG/ML) PREMIX INFUSION
0.5000 mg/h | INTRAVENOUS | Status: DC
  Administered 2019-09-19: 06:00:00 0.5 mg/h via INTRAVENOUS
  Filled 2019-09-19: qty 50

## 2019-09-19 MED ORDER — HYDROCORTISONE NA SUCCINATE PF 100 MG IJ SOLR
50.0000 mg | Freq: Two times a day (BID) | INTRAMUSCULAR | Status: DC
  Administered 2019-09-19 – 2019-09-20 (×3): 50 mg via INTRAVENOUS
  Filled 2019-09-19 (×3): qty 2

## 2019-09-19 MED ORDER — VASOPRESSIN 20 UNIT/ML IV SOLN
0.0300 [IU]/min | INTRAVENOUS | Status: DC
  Administered 2019-09-19 – 2019-09-23 (×5): 0.03 [IU]/min via INTRAVENOUS
  Filled 2019-09-19 (×6): qty 2

## 2019-09-19 MED ORDER — SODIUM CHLORIDE 0.9 % IV BOLUS
500.0000 mL | Freq: Once | INTRAVENOUS | Status: AC
  Administered 2019-09-19: 05:00:00 500 mL via INTRAVENOUS

## 2019-09-19 MED ORDER — VITAL 1.5 CAL PO LIQD
1000.0000 mL | ORAL | Status: DC
  Administered 2019-09-19 – 2019-10-01 (×10): 1000 mL
  Filled 2019-09-19 (×22): qty 1000

## 2019-09-19 MED ORDER — MUPIROCIN 2 % EX OINT
1.0000 "application " | TOPICAL_OINTMENT | Freq: Two times a day (BID) | CUTANEOUS | Status: AC
  Administered 2019-09-19 – 2019-09-23 (×10): 1 via NASAL
  Filled 2019-09-19 (×3): qty 22

## 2019-09-19 NOTE — Progress Notes (Signed)
Arrived to pt room for EEG pt is going to MRI. Will try back later

## 2019-09-19 NOTE — Procedures (Signed)
Intubation Procedure Note Chris Lewis [id number] 1941/07/19  Procedure: Intubation Indications: Airway protection and maintenance, respiratory failure (tachypnea, work of breathing)  Procedure Details Consent: Risks of procedure as well as the alternatives and risks of each were explained to the (patient/caregiver).  Consent for procedure obtained. Discussed with wife [name] Out: Verified patient identification, verified procedure, site/side was marked, verified correct patient position, special equipment/implants available, medications/allergies/relevent history reviewed, required imaging and test results available.  Performed  Maximum sterile technique was used including cap, gloves, gown, hand hygiene, mask and sheet.  4    Evaluation Hemodynamic Status: Transient hypotension treated with pressors; O2 sats: stable throughout , transiently fell to 88 rapidly improved Patient's Current Condition: stable Complications: No apparent complications Patient did tolerate procedure well. Chest X-ray ordered to verify placement.  CXR: tube position acceptable.   Collier Bullock 09/19/2019

## 2019-09-19 NOTE — Progress Notes (Signed)
EEG complete - results pending 

## 2019-09-19 NOTE — ED Notes (Signed)
Please call Trason Lefave daughter @ [phone] a status update--Lexys Milliner

## 2019-09-19 NOTE — H&P (Signed)
See consult note yesterday by Dr. Patsey Berthold.

## 2019-09-19 NOTE — Consult Note (Addendum)
NAME:  Chris Lewis, MRN:  [id number], DOB:  29-Mar-1941, LOS: 1 ADMISSION DATE:  09/21/2019, CONSULTATION DATE:  09/27/2019 REFERRING MD:  Alvino Chapel, CHIEF COMPLAINT:  AMS, fall  Brief History   Chris Lewis is an 79 year old man with a history of COPD (emphysema seen on CT scan), Achalasia, anxiety/depression, HLD, hypothyroidism, RBBB, MR s/p MV repair, Recently hospitalized at [facility] for covid PNA (1/12-1/25) now here with AMS. Per wife he fell overnight while trying to get out of bed on his own.  This morning was found altered and not speaking by his family.      History of present illness   In Ed, lethargic, moaning, non verbal. Febrile  Respiratory distress, tachpneic but maintaining sats on supplemental oxygen.  Started on ceftriaxone and given dose of systemic steroids.  Baseline LLE>RLE weakness  Per ED exam decreased strength on the R side  CT head revealed possible stroke.  CTA negative.   Discharged from [facility] on 2L [place], had needed to increase it to 4L at home.  Has been on a hydrocortisone taper.  1/25-1/28 80mg   1/29-2/1 40mg   2/2-2/5 20mg    Past Medical History  Achalasia, anxiety/depression, HLD, hypothyroidism, RBBB, MR s/p MV repair, Recently hospitalized at [facility] for covid PNA (1/12-1/25)   Significant Hospital Events     Consults:  pccm  Procedures:  Intubation 2/2   Significant Diagnostic Tests:  CT head   Micro Data:  2/2 Bl cutures P  Antimicrobials:  Ceftriaxone x 1 2/2 Cefepime 2/2 --> Vancomycin 2/3 --> Ampicillin 2/3 -->  Flagyl 2/3 -->  Interim history/subjective:    Objective   Blood pressure 138/83, pulse (!) 125, temperature (!) 102.4 F (39.1 C), temperature source Rectal, resp. rate 18, height 5\' 8"  (1.727 m), weight 80.2 kg, SpO2 100 %.    Vent Mode: PRVC FiO2 (%):  [100 %] 100 % Set Rate:  [18 bmp] 18 bmp Vt Set:  [450 mL-550 mL] 550 mL PEEP:  [5 cmH20] 5 cmH20 Plateau Pressure:  [12 cmH20] 12 cmH20   Intake/Output Summary (Last 24  hours) at 09/19/2019 0048 Last data filed at 09/23/2019 1843 Gross per 24 hour  Intake 2500 ml  Output --  Net 2500 ml   Filed Weights   10/03/2019 1640  Weight: 80.2 kg    Examination: General: opens eyes to sternal rub, non verbal, moaning, does not follow any commands or withdraw to pain.  Tachypneic to the high 30s/40s HENT: MM dry, P[place]l Lungs: no wheezes, no crackles  Cardiovascular: tachycardic Abdomen: No apparent tenderness Extremities: no edema, no erythema in LE, RUE forearm with 2cm  eschar, mild surrounding erythema Neuro: lethargic, minimally arousable, not tracking   Resolved Hospital Problem list     Assessment & Plan:  Acute encephalopathy: Stroke seems less likely.  Possibly metabolic due to sepsis, though AMS seems out of proportion to lab and vital sign abnormalities.   Consider meningitis vs encephalitis given fever.  Generalized weakness, fall when tried to walk last night.    Discussed with neurology.  MRI ordered.  Would like to do LP, give platelets first, neuro requesting IR consult for LP. Cefepime flagyl vanc for aspiration PNA after recent hospitalization.  Cefepime for possibly meningitis, also adding ampicillin.   Intubated for airway protection in the setting of severe encephalopathy and tachypnea, apparent aspiration PNA.  O2 Sat actually acceptable on6L [place], actually ventilating ok, but tachypnea and wob worrisome.    Hypoxemic Respiratory failure: hx of COPD,  severe emphysema on CT imaging from prior admission.  Recent admission for covid PNA 1/12-1/25.  Returning with increasing SOB.  Recent increased O2 needs at home (increased from 2L on discharge to 4L ).   B LL infiltrates.  Fever, tachypnea - will treat for pna.  Given AMS, concern for asp PNA as well.   Strep Urine ag, leg Ag.   Sputum and blood cultures.  COVID isolation to continue as precaution.  (22 days from positive test, but symptoms are still present, though suspect they are possibly  from a different source). LTV ventilation (8cc/kg).   Sepsis: fever, tachycardia (sinus). Source likely PNA.   Cont broad abtx.   Already received 1 L fluids.  BNP elevated, IVC not collapsing (poor EF on bedside echo).  Caution with further fluids.  Had been on steroid taper, will order hydrocort 50 BID, can increase to typical sepsis dosing if remains hypotension  NSTEMI:  RBBB old.  Trop elevated but trending down. Monitor.   Thrombocytopenia: suspect DIC, will work up with panel.  2 packs platelets ordered.   AKI: likey ATN 2/2 sepsis.   Hypothyroidism :  Home levothyroxine.   Anxiety/pain: At home takes Tramadol  Lexapro 10 mg daily  klonopin 0.5 bid prn  Best practice:  Diet: npo Pain/Anxiety/Delirium protocol (if indicated): was on precedex, concern that it dropped HR too much, switched to versed and fent.  VAP protocol (if indicated):  DVT prophylaxis: heparin GI prophylaxis: protonix  Glucose control: Mobility: Code Status: DNR, shocks ok, intubation ok, confirmed with his wife  Family Communication: discussed with wife.  Disposition: ICU  Labs   CBC: Recent Labs  Lab 09/17/2019 1545 09/20/2019 1824 09/19/19 0028  WBC 13.5*  --   --   NEUTROABS 12.5*  --   --   HGB 13.8 12.9* 13.3  HCT 41.9 38.0* 39.0  MCV 92.5  --   --   PLT 56*  --   --     Basic Metabolic Panel: Recent Labs  Lab 09/20/2019 1545 09/28/2019 1824 09/19/19 0028  NA 132* 134* 135  K 4.5 4.1 4.0  CL 98  --   --   CO2 19*  --   --   GLUCOSE 121*  --   --   BUN 25*  --   --   CREATININE 1.61*  --   --   CALCIUM 7.9*  --   --    GFR: Estimated Creatinine Clearance: 35.4 mL/min (A) (by C-G formula based on SCr of 1.61 mg/dL (H)). Recent Labs  Lab 09/23/2019 1545 09/26/2019 1831  PROCALCITON  --  3.36  WBC 13.5*  --   LATICACIDVEN 4.6* 3.4*    Liver Function Tests: Recent Labs  Lab 10/09/2019 1545  AST 55*  ALT 37  ALKPHOS 87  BILITOT 2.0*  PROT 5.5*  ALBUMIN 2.7*   No  results for input(s): LIPASE, AMYLASE in the last 168 hours. No results for input(s): AMMONIA in the last 168 hours.  ABG    Component Value Date/Time   PHART 7.263 (L) 09/19/2019 0028   PCO2ART 49.2 (H) 09/19/2019 0028   PO2ART 245.0 (H) 09/19/2019 0028   HCO3 22.1 09/19/2019 0028   TCO2 23 09/19/2019 0028   ACIDBASEDEF 5.0 (H) 09/19/2019 0028   O2SAT 100.0 09/19/2019 0028     Coagulation Profile: Recent Labs  Lab 10/11/2019 1831  INR 1.3*    Cardiac Enzymes: No results for input(s): CKTOTAL, CKMB, CKMBINDEX, TROPONINI in the last  168 hours.  HbA1C: Hgb A1c MFr Bld  Date/Time Value Ref Range Status  08/29/2018 12:09 PM 5.9 4.6 - 6.5 % Final    Comment:    Glycemic Control Guidelines for People with Diabetes:Non Diabetic:  <6%Goal of Therapy: <7%Additional Action Suggested:  >8%   07/06/2017 11:52 AM 6.0 4.6 - 6.5 % Final    Comment:    Glycemic Control Guidelines for People with Diabetes:Non Diabetic:  <6%Goal of Therapy: <7%Additional Action Suggested:  >8%     CBG: No results for input(s): GLUCAP in the last 168 hours.  Review of Systems:   Unable to assess  Past Medical History  He,  has a past medical history of Achalasia, Anxiety, Arthritis, COPD (chronic obstructive pulmonary disease) ([place]), Depression, Dilated aortic root ([place]), FH: cholecystectomy, Gallstones, GERD (gastroesophageal reflux disease), Hiatal hernia, History of alcoholism ([place]), Hypercholesterolemia, Hypothyroidism (01/12/2016), Lesion of right lung (06/04/2013), MVP (mitral valve prolapse), Osteopenia, Pneumonia (06/04/2013), RBBB, S/P right inguinal herniorrhaphy, Seasonal allergic rhinitis, Stroke ([place]) (10/2002), and Transient ischemic attack.   Surgical History    Past Surgical History:  Procedure Laterality Date  . BACK SURGERY     x 3  . BOTOX INJECTION  06/27/2012   Procedure: BOTOX INJECTION;  Surgeon: Garlan Fair, MD;  Location: [facility];  Service: Endoscopy;  Laterality: N/A;    . CHOLECYSTECTOMY    . ESOPHAGOGASTRODUODENOSCOPY  01/13/2012   Procedure: ESOPHAGOGASTRODUODENOSCOPY (EGD);  Surgeon: Garlan Fair, MD;  Location: [facility];  Service: Endoscopy;  Laterality: N/A;  botox /katrina  . ESOPHAGOGASTRODUODENOSCOPY  06/27/2012   Procedure: ESOPHAGOGASTRODUODENOSCOPY (EGD);  Surgeon: Garlan Fair, MD;  Location: [facility];  Service: Endoscopy;  Laterality: N/A;  . ESOPHAGOGASTRODUODENOSCOPY (EGD) WITH ESOPHAGEAL DILATION N/A 11/22/2012   Procedure: ESOPHAGOGASTRODUODENOSCOPY (EGD) WITH BOTOX INJECTION;  Surgeon: Garlan Fair, MD;  Location: [facility];  Service: Endoscopy;  Laterality: N/A;  . Esophagogastroduodenoscopy and Savary esophageal dilation.     x9  . ESOPHAGOSCOPY W/ BOTOX INJECTION     x9 beginning 2008, most recent Sept. 2012  . eyebrow surgery  2012  . FOOT SURGERY Left    screw -fx  . 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 MITRAL VALVE REPAIR  08/03/2011   Procedure: MINIMALLY INVASIVE MITRAL VALVE REPAIR (MVR);  Surgeon: Rexene Alberts, MD;  Location: [facility];  Service: Open Heart Surgery;  Laterality: Right;  minimally invasive MVR  . rt inguinal herniorrhaphy  1969/1997  . TEE WITHOUT CARDIOVERSION  07/06/2011   Procedure: TRANSESOPHAGEAL ECHOCARDIOGRAM (TEE);  Surgeon: Sueanne Margarita, MD;  Location: [facility];  Service: Cardiovascular;  Laterality: N/A;  . VIDEO BRONCHOSCOPY WITH ENDOBRONCHIAL NAVIGATION N/A 04/03/2014   Procedure: VIDEO BRONCHOSCOPY WITH ENDOBRONCHIAL NAVIGATION;  Surgeon: Collene Gobble, MD;  Location: [facility];  Service: Thoracic;  Laterality: N/A;     Social History   reports that he quit smoking about 22 years ago. His smoking use included cigarettes. He has a 57.00 pack-year smoking history. He has never used smokeless tobacco. He reports that he does not drink alcohol or use drugs.   Family History   His family history includes Cancer in his sister; Esophageal cancer in an other family  member; Heart disease in his sister; Lung cancer in his sister; Prostate cancer in his paternal uncle; Scleroderma (age of onset: 84) in his mother; Stroke (age of onset: 72) in his father. There is no history of Anesthesia problems, Colon cancer, or Liver cancer.  Allergies Allergies  Allergen Reactions  . Duac [Clindamycin Phos-Benzoyl Perox] Rash  . Contrast Media [Iodinated Diagnostic Agents] Rash    Heavy rash and itching 20 yrs ago. Needs full premeds and does well with this.  . Atorvastatin     muscle pain  . Propoxyphene N-Acetaminophen Itching    rash  . Rosuvastatin     muscle weakness  . Metrizamide Rash    radiopaque contrast agent. Heavy rash and itching 20 yrs ago. Needs full premeds and does well with this.  Orie Fisherman Vegetable Orange [Psyllium]     Heartburn   . Other Rash    High acid food--tomatoes, orange juice  . Tomato     Heartburn      Home Medications  Prior to Admission medications   Medication Sig Start Date End Date Taking? Authorizing Provider  albuterol (VENTOLIN HFA) 108 (90 Base) MCG/ACT inhaler INHALE 2 PUFFS BY MOUTH EVERY 4 HOURS AS NEEDED FOR WHEEZE Patient taking differently: Inhale 2 puffs into the lungs every 4 (four) hours as needed for wheezing.  05/22/19   Parrett, Fonnie Mu, NP  azelastine (ASTELIN) 0.1 % nasal spray Place 2 sprays into both nostrils at bedtime. Use in each nostril as directed 01/17/18   Parrett, Fonnie Mu, NP  calcium carbonate (TUMS EX) 750 MG chewable tablet Chew 1 tablet by mouth as needed (for gas and reflux).     [provider]  cetirizine (ZYRTEC) 10 MG tablet Take 1 tablet (10 mg total) by mouth daily as needed for allergies. Reported on 10/27/2015 01/06/17   Biagio Borg, MD  Cholecalciferol (VITAMIN D3) 1000 UNITS CAPS Take 1,000 Units by mouth daily. Reported on 09/16/2015    [provider]  clonazePAM (KLONOPIN) 0.5 MG tablet take 1 tablet by mouth twice a day if needed Patient taking differently:  Take 0.5 mg by mouth 2 (two) times daily as needed for anxiety.  05/28/19   Biagio Borg, MD  diclofenac sodium (VOLTAREN) 1 % GEL Apply 2 g topically 4 (four) times daily. 08/29/18   Biagio Borg, MD  escitalopram (LEXAPRO) 10 MG tablet TAKE 1 TABLET BY MOUTH EVERY DAY Patient taking differently: Take 10 mg by mouth daily.  11/27/18   Biagio Borg, MD  hydrocortisone (CORTEF) 20 MG tablet Take 2 tablets twice daily for 4 days, followed by 1 tablet twice daily for 4 days, followed by 1 tablet once daily for 4 days, then STOP. 09/10/19   Bonnielee Haff, MD  levothyroxine (SYNTHROID, LEVOTHROID) 25 MCG tablet TAKE 1 TABLET BY MOUTH EVERY DAY BEFORE BREAKFAST Patient taking differently: Take 25 mcg by mouth daily before breakfast.  11/27/18   Biagio Borg, MD  metroNIDAZOLE (METROGEL) 0.75 % gel Apply 1 application topically 2 (two) times daily.    [provider]  pantoprazole (PROTONIX) 40 MG tablet Take 40 mg by mouth daily. 05/11/19   [provider]  tiotropium (SPIRIVA HANDIHALER) 18 MCG inhalation capsule PLACE 1 CAPSULE INTO INHALER AND INHALE DAILY Patient taking differently: Place 18 mcg into inhaler and inhale daily.  08/03/19   Parrett, Fonnie Mu, NP  traMADol (ULTRAM) 50 MG tablet Take 1 tablet (50 mg total) by mouth every 12 (twelve) hours as needed. Patient taking differently: Take 50 mg by mouth every 12 (twelve) hours as needed for moderate pain.  05/28/19   Biagio Borg, MD     Critical care time: 59 Minutes      Evaluated patient in  early am.  Partially sedated on vent, chewing on ET tube.   BP borderline (MAP 50s) on levophed 8 peripherally.  Placed Central line for pressors.  Bedside echo reveals poor EF.  No phenylephrine.  Will increase levophed as needed, add epi if need more.  Did not respond to small fluid bolus.

## 2019-09-19 NOTE — Plan of Care (Signed)
  Problem: Education: Goal: Knowledge of risk factors and measures for prevention of condition will improve Outcome: Progressing   Problem: Coping: Goal: Psychosocial and spiritual needs will be supported Outcome: Progressing   Problem: Respiratory: Goal: Will maintain a patent airway Outcome: Progressing Goal: Complications related to the disease process, condition or treatment will be avoided or minimized Outcome: Progressing   Problem: Education: Goal: Knowledge of General Education information will improve Description: Including pain rating scale, medication(s)/side effects and non-pharmacologic comfort measures Outcome: Progressing   Problem: Health Behavior/Discharge Planning: Goal: Ability to manage health-related needs will improve Outcome: Progressing   Problem: Clinical Measurements: Goal: Ability to maintain clinical measurements within normal limits will improve Outcome: Progressing Goal: Will remain free from infection Outcome: Progressing Goal: Diagnostic test results will improve Outcome: Progressing Goal: Respiratory complications will improve Outcome: Progressing Goal: Cardiovascular complication will be avoided Outcome: Progressing   Problem: Activity: Goal: Risk for activity intolerance will decrease Outcome: Progressing   Problem: Nutrition: Goal: Adequate nutrition will be maintained Outcome: Progressing   Problem: Coping: Goal: Level of anxiety will decrease Outcome: Progressing   Problem: Pain Managment: Goal: General experience of comfort will improve Outcome: Progressing   Problem: Safety: Goal: Ability to remain free from injury will improve Outcome: Progressing   Problem: Skin Integrity: Goal: Risk for impaired skin integrity will decrease Outcome: Progressing

## 2019-09-19 NOTE — Progress Notes (Signed)
Patient was transported to MRI & back to 3M12 without any complications.

## 2019-09-19 NOTE — Progress Notes (Signed)
Subjective: Light sedation with 0.5 mg/hr of Versed.  Objective: Current vital signs: BP 98/70 (BP Location: Left Arm)   Pulse 78   Temp 98.6 F (37 C) (Axillary)   Resp (!) 28   Ht 5' 8"  (1.727 m) Comment: Per family  Wt 80.6 kg   SpO2 97%   BMI 27.02 kg/m  Vital signs in last 24 hours: Temp:  [98.2 F (36.8 C)-102.8 F (39.3 C)] 98.6 F (37 C) (02/03 0520) Pulse Rate:  [57-132] 78 (02/03 0502) Resp:  [13-44] 28 (02/03 0502) BP: (67-144)/(45-92) 98/70 (02/03 0600) SpO2:  [90 %-100 %] 97 % (02/03 0502) FiO2 (%):  [60 %-100 %] 60 % (02/03 0600) Weight:  [80.2 kg-80.6 kg] 80.6 kg (02/03 0322)  Intake/Output from previous day: 02/02 0701 - 02/03 0700 In: 3973.2 [I.V.:241; Blood:427.5; IV Piggyback:3304.8] Out: 350 [Urine:350] Intake/Output this shift: No intake/output data recorded. Nutritional status:  Diet Order    None     HEENT: Nuchal rigidity is noted in flexion and rotation.  Lungs: Intubated Ext: No cyanosis noted.   Neurologic Exam: Mental Status: Will open eyes to name. Not following any commands. No attempts to communicate. Semipurposeful to nonpurposeful weak flailing of LUE to noxious. Does not gaze towards or away from visual stimuli.  Cranial Nerves: II:  Pupils 2 mm and sluggishly reactive bilaterally. Does not gaze towards or away from visual stimuli.  No blink to threat bilaterally.  III,IV, VI: Suppressed doll's eye reflex. Eyes conjugately at the midline.  V,VII: Weak corneal reflexes bilaterally.  VIII: Opens eyes to voice.  IX,X: Intubated XI: Head slightly rotated to the right.  XII: Intubated Motor/Sensory: Flaccid tone RUE with no movement to pinch.  Decreased tone LUE with semipurposeful to nonpurposeful weak flailing of LUE to noxious.  RLE with moderate to severely increased/spastic tone. Weak dorsiflexion of foot to plantar stimulation. No other movement to noxious.  LLE with mild to moderately increased tone. Briskly withdraws with  2-3/5 strength to noxious.  Deep Tendon Reflexes:  Hypoactive brachioradialis reflexes bilaterally.  3+ low amplitude patellar reflexes.  1+ achilles reflexes.  Right toe upgoing, left downgoing.  Cerebellar/Gait: Unable to assess   Lab Results: Results for orders placed or performed during the hospital encounter of 10/12/2019 (from the past 48 hour(s))  Comprehensive metabolic panel     Status: Abnormal   Collection Time: 10/02/2019  3:45 PM  Result Value Ref Range   Sodium 132 (L) 135 - 145 mmol/L   Potassium 4.5 3.5 - 5.1 mmol/L    Comment: SLIGHT HEMOLYSIS   Chloride 98 98 - 111 mmol/L   CO2 19 (L) 22 - 32 mmol/L   Glucose, Bld 121 (H) 70 - 99 mg/dL   BUN 25 (H) 8 - 23 mg/dL   Creatinine, Ser 1.61 (H) 0.61 - 1.24 mg/dL   Calcium 7.9 (L) 8.9 - 10.3 mg/dL   Total Protein 5.5 (L) 6.5 - 8.1 g/dL   Albumin 2.7 (L) 3.5 - 5.0 g/dL   AST 55 (H) 15 - 41 U/L   ALT 37 0 - 44 U/L   Alkaline Phosphatase 87 38 - 126 U/L   Total Bilirubin 2.0 (H) 0.3 - 1.2 mg/dL   GFR calc non Af Amer 40 (L) >60 mL/min   GFR calc Af Amer 46 (L) >60 mL/min   Anion gap 15 5 - 15    Comment: Performed at [facility], [address] [place], [place] [address]  Lactic acid, plasma  Status: Abnormal   Collection Time: 10/12/2019  3:45 PM  Result Value Ref Range   Lactic Acid, Venous 4.6 (HH) 0.5 - 1.9 mmol/L    Comment: CRITICAL RESULT CALLED TO, READ BACK BY AND VERIFIED WITH: Myna Hidalgo RN [id number] M GARRETT Performed at [facility], [place] [address], [place], [place] [address]   CBC with Differential     Status: Abnormal   Collection Time: 09/24/2019  3:45 PM  Result Value Ref Range   WBC 13.5 (H) 4.0 - 10.5 K/uL   RBC 4.53 4.22 - 5.81 MIL/uL   Hemoglobin 13.8 13.0 - 17.0 g/dL   HCT 41.9 39.0 - 52.0 %   MCV 92.5 80.0 - 100.0 fL   MCH 30.5 26.0 - 34.0 pg   MCHC 32.9 30.0 - 36.0 g/dL   RDW 15.9 (H) 11.5 - 15.5 %   Platelets 56 (L) 150 - 400 K/uL    Comment: REPEATED TO  VERIFY PLATELET COUNT CONFIRMED BY SMEAR Immature Platelet Fraction may be clinically indicated, consider ordering this additional test [id number]    nRBC 0.0 0.0 - 0.2 %   Neutrophils Relative % 92 %   Neutro Abs 12.5 (H) 1.7 - 7.7 K/uL   Lymphocytes Relative 3 %   Lymphs Abs 0.4 (L) 0.7 - 4.0 K/uL   Monocytes Relative 3 %   Monocytes Absolute 0.4 0.1 - 1.0 K/uL   Eosinophils Relative 0 %   Eosinophils Absolute 0.0 0.0 - 0.5 K/uL   Basophils Relative 1 %   Basophils Absolute 0.1 0.0 - 0.1 K/uL   Immature Granulocytes 1 %   Abs Immature Granulocytes 0.13 (H) 0.00 - 0.07 K/uL    Comment: Performed at [facility], [place] [address], [place], [place] [address]  TSH     Status: None   Collection Time: 10/05/2019  3:45 PM  Result Value Ref Range   TSH 2.737 0.350 - 4.500 uIU/mL    Comment: Performed by a 3rd Generation assay with a functional sensitivity of <=0.01 uIU/mL. Performed at [facility], [place] [address], [place], [place] [address]   I-STAT 7, (LYTES, BLD GAS, ICA, H+H)     Status: Abnormal   Collection Time: 09/21/2019  6:24 PM  Result Value Ref Range   pH, Arterial 7.448 7.350 - 7.450   pCO2 arterial 28.2 (L) 32.0 - 48.0 mmHg   pO2, Arterial 65.0 (L) 83.0 - 108.0 mmHg   Bicarbonate 19.2 (L) 20.0 - 28.0 mmol/L   TCO2 20 (L) 22 - 32 mmol/L   O2 Saturation 92.0 %   Acid-base deficit 3.0 (H) 0.0 - 2.0 mmol/L   Sodium 134 (L) 135 - 145 mmol/L   Potassium 4.1 3.5 - 5.1 mmol/L   Calcium, Ion 1.10 (L) 1.15 - 1.40 mmol/L   HCT 38.0 (L) 39.0 - 52.0 %   Hemoglobin 12.9 (L) 13.0 - 17.0 g/dL   Patient temperature 102.0 F    Collection site RADIAL, ALLEN'S TEST ACCEPTABLE    Drawn by MD    Sample type ARTERIAL   Lactic acid, plasma     Status: Abnormal   Collection Time: 09/24/2019  6:31 PM  Result Value Ref Range   Lactic Acid, Venous 3.4 (HH) 0.5 - 1.9 mmol/L    Comment: CRITICAL VALUE NOTED.  VALUE IS CONSISTENT WITH PREVIOUSLY REPORTED AND CALLED VALUE. Performed at  [facility], [place] [address] [place], [place] [address]   D-dimer, quantitative  Status: Abnormal   Collection Time: 10/13/2019  6:31 PM  Result Value Ref Range   D-Dimer, Quant 17.50 (H) 0.00 - 0.50 ug/mL-FEU    Comment: (NOTE) At the manufacturer cut-off of 0.50 ug/mL FEU, this assay has been documented to exclude PE with a sensitivity and negative predictive value of 97 to 99%.  At this time, this assay has not been approved by the FDA to exclude DVT/VTE. Results should be correlated with clinical presentation. Performed at [facility], [place] [address], [place], [place] [address]   Lactate dehydrogenase     Status: Abnormal   Collection Time: 09/29/2019  6:31 PM  Result Value Ref Range   LDH 353 (H) 98 - 192 U/L    Comment: Performed at [facility], [place] [address] [place], [place] [address]  Ferritin     Status: Abnormal   Collection Time: 09/20/2019  6:31 PM  Result Value Ref Range   Ferritin 878 (H) 24 - 336 ng/mL    Comment: Performed at [facility], [place] [address], [place], [place] [address]  Fibrinogen     Status: None   Collection Time: 09/21/2019  6:31 PM  Result Value Ref Range   Fibrinogen 393 210 - 475 mg/dL    Comment: Performed at [facility] [address] [place], [place] [address]  C-reactive protein     Status: Abnormal   Collection Time: 09/24/2019  6:31 PM  Result Value Ref Range   CRP 18.6 (H) <1.0 mg/dL    Comment: Performed at [place] [address] [place], [place] [address]  Brain natriuretic peptide     Status: Abnormal   Collection Time: 09/17/2019  6:31 PM  Result Value Ref Range   B Natriuretic Peptide 1,252.0 (H) 0.0 - 100.0 pg/mL    Comment: Performed at [place] [address] [place], [place] [address]  Troponin I (High Sensitivity)     Status: Abnormal   Collection Time: 10/14/2019  6:31 PM  Result Value Ref Range   Troponin I (High Sensitivity) 1,534 (HH) <18 ng/L    Comment: CRITICAL  RESULT CALLED TO, READ BACK BY AND VERIFIED WITH: C MCGURY,RN 2000 10/10/2019 WBOND (NOTE) Elevated high sensitivity troponin I (hsTnI) values and significant  changes across serial measurements may suggest ACS but many other  chronic and acute conditions are known to elevate hsTnI results.  Refer to the Links section for chest pain algorithms and additional  guidance. Performed at [facility], [place] [address], [place], [place] [address]   Protime-INR     Status: Abnormal   Collection Time: 10/06/2019  6:31 PM  Result Value Ref Range   Prothrombin Time 16.1 (H) 11.4 - 15.2 seconds   INR 1.3 (H) 0.8 - 1.2    Comment: (NOTE) INR goal varies based on device and disease states. Performed at [facility], [place] [address], [place], [place] [address]   Triglycerides     Status: None   Collection Time: 09/17/2019  6:31 PM  Result Value Ref Range   Triglycerides 139 <150 mg/dL    Comment: Performed at [place] [address] [name], [place] [address]  Procalcitonin     Status: None   Collection Time: 10/07/2019  6:31 PM  Result Value Ref Range   Procalcitonin 3.36 ng/mL    Comment:        Interpretation: PCT > 2 ng/mL: Systemic  infection (sepsis) is likely, unless other causes are known. (NOTE)       Sepsis PCT Algorithm           Lower Respiratory Tract                                      Infection PCT Algorithm    ----------------------------     ----------------------------         PCT < 0.25 ng/mL                PCT < 0.10 ng/mL         Strongly encourage             Strongly discourage   discontinuation of antibiotics    initiation of antibiotics    ----------------------------     -----------------------------       PCT 0.25 - 0.50 ng/mL            PCT 0.10 - 0.25 ng/mL               OR       >80% decrease in PCT            Discourage initiation of                                            antibiotics      Encourage discontinuation           of  antibiotics    ----------------------------     -----------------------------         PCT >= 0.50 ng/mL              PCT 0.26 - 0.50 ng/mL               AND       <80% decrease in PCT              Encourage initiation of                                             antibiotics       Encourage continuation           of antibiotics    ----------------------------     -----------------------------        PCT >= 0.50 ng/mL                  PCT > 0.50 ng/mL               AND         increase in PCT                  Strongly encourage                                      initiation of antibiotics    Strongly encourage escalation           of antibiotics                                     -----------------------------  PCT <= 0.25 ng/mL                                                 OR                                        > 80% decrease in PCT                                     Discontinue / Do not initiate                                             antibiotics Performed at [facility], [place] [address], [place], [place] [address]   Urinalysis, Routine w reflex microscopic     Status: Abnormal   Collection Time: 09/22/2019 10:30 PM  Result Value Ref Range   Color, Urine AMBER (A) YELLOW    Comment: BIOCHEMICALS MAY BE AFFECTED BY COLOR   APPearance CLOUDY (A) CLEAR   Specific Gravity, Urine 1.021 1.005 - 1.030   pH 5.0 5.0 - 8.0   Glucose, UA NEGATIVE NEGATIVE mg/dL   Hgb urine dipstick LARGE (A) NEGATIVE   Bilirubin Urine NEGATIVE NEGATIVE   Ketones, ur NEGATIVE NEGATIVE mg/dL   Protein, ur 100 (A) NEGATIVE mg/dL   Nitrite NEGATIVE NEGATIVE   Leukocytes,Ua LARGE (A) NEGATIVE   RBC / HPF 0-5 0 - 5 RBC/hpf   WBC, UA 6-10 0 - 5 WBC/hpf   Bacteria, UA RARE (A) NONE SEEN   Mucus PRESENT    Granular Casts, UA PRESENT     Comment: Performed at [facility], [address] [place], [place] [address]  I-STAT 7, (LYTES, BLD GAS,  ICA, H+H)     Status: Abnormal   Collection Time: 09/19/19 12:28 AM  Result Value Ref Range   pH, Arterial 7.263 (L) 7.350 - 7.450   pCO2 arterial 49.2 (H) 32.0 - 48.0 mmHg   pO2, Arterial 245.0 (H) 83.0 - 108.0 mmHg   Bicarbonate 22.1 20.0 - 28.0 mmol/L   TCO2 23 22 - 32 mmol/L   O2 Saturation 100.0 %   Acid-base deficit 5.0 (H) 0.0 - 2.0 mmol/L   Sodium 135 135 - 145 mmol/L   Potassium 4.0 3.5 - 5.1 mmol/L   Calcium, Ion 1.10 (L) 1.15 - 1.40 mmol/L   HCT 39.0 39.0 - 52.0 %   Hemoglobin 13.3 13.0 - 17.0 g/dL   Patient temperature 100.0 F    Collection site RADIAL, ALLEN'S TEST ACCEPTABLE    Drawn by RT    Sample type ARTERIAL   Influenza panel by PCR (type A & B)     Status: None   Collection Time: 09/19/19 12:30 AM  Result Value Ref Range   Influenza A By PCR NEGATIVE NEGATIVE   Influenza B By PCR NEGATIVE NEGATIVE    Comment: (NOTE) The Xpert Xpress Flu assay is intended as an aid in the diagnosis of  influenza and should not be used as a sole basis for treatment.  This  assay  is FDA approved for nasopharyngeal swab specimens only. Nasal  washings and aspirates are unacceptable for Xpert Xpress Flu testing. Performed at [facility], [place] [address] [place], [place] [address]   CBC     Status: Abnormal   Collection Time: 09/19/19 12:36 AM  Result Value Ref Range   WBC 19.3 (H) 4.0 - 10.5 K/uL   RBC 4.57 4.22 - 5.81 MIL/uL   Hemoglobin 14.1 13.0 - 17.0 g/dL   HCT 43.6 39.0 - 52.0 %   MCV 95.4 80.0 - 100.0 fL   MCH 30.9 26.0 - 34.0 pg   MCHC 32.3 30.0 - 36.0 g/dL   RDW 16.1 (H) 11.5 - 15.5 %   Platelets 69 (L) 150 - 400 K/uL    Comment: REPEATED TO VERIFY PLATELET COUNT CONFIRMED BY SMEAR SPECIMEN CHECKED FOR CLOTS Immature Platelet Fraction may be clinically indicated, consider ordering this additional test [id number]    nRBC 0.0 0.0 - 0.2 %    Comment: Performed at [facility], [place] [address], [place], [place] [address]  Creatinine, serum      Status: Abnormal   Collection Time: 09/19/19 12:36 AM  Result Value Ref Range   Creatinine, Ser 1.49 (H) 0.61 - 1.24 mg/dL   GFR calc non Af Amer 44 (L) >60 mL/min   GFR calc Af Amer 51 (L) >60 mL/min    Comment: Performed at [facility] [address] [place], [place] [address]  Prepare Pheresed Platelets     Status: None (Preliminary result)   Collection Time: 09/19/19  1:16 AM  Result Value Ref Range   Unit Number [id number]    Blood Component Type PLTP LR1 PAS    Unit division 00    Status of Unit ISSUED    Transfusion Status OK TO TRANSFUSE    Unit Number [id number]    Blood Component Type PLTP LR1 PAS    Unit division 00    Status of Unit ISSUED    Transfusion Status      OK TO TRANSFUSE Performed at [facility], [place] [address], [place], [place] [address]   Glucose, capillary     Status: Abnormal   Collection Time: 09/19/19  2:22 AM  Result Value Ref Range   Glucose-Capillary 205 (H) 70 - 99 mg/dL  MRSA PCR Screening     Status: Abnormal   Collection Time: 09/19/19  2:34 AM   Specimen: Nasal Mucosa; Nasopharyngeal  Result Value Ref Range   MRSA by PCR POSITIVE (A) NEGATIVE    Comment: CRITICAL RESULT CALLED TO, READ BACK BY AND VERIFIED WITH: RN Mammie Russian [id number] @ [phone] [facility] Performed at [facility], [place] [address], [place], [place] [address]   Troponin I (High Sensitivity)     Status: Abnormal   Collection Time: 09/19/19  2:39 AM  Result Value Ref Range   Troponin I (High Sensitivity) 1,285 (HH) <18 ng/L    Comment: CRITICAL VALUE NOTED.  VALUE IS CONSISTENT WITH PREVIOUSLY REPORTED AND CALLED VALUE. Performed at [facility], [place] [address] [place], [place] [address]   Type and screen [place]     Status: None   Collection Time: 09/19/19  3:10 AM  Result Value Ref Range   ABO/RH(D) A POS    Antibody Screen NEG    Sample Expiration      09/22/2019,2359 Performed at [facility], [place]  [address] [place], [place] [address]  I-STAT 7, (LYTES, BLD GAS, ICA, H+H)     Status: Abnormal   Collection Time: 09/19/19  4:51 AM  Result Value Ref Range   pH, Arterial 7.314 (L) 7.350 - 7.450   pCO2 arterial 39.9 32.0 - 48.0 mmHg   pO2, Arterial 102.0 83.0 - 108.0 mmHg   Bicarbonate 19.8 (L) 20.0 - 28.0 mmol/L   TCO2 21 (L) 22 - 32 mmol/L   O2 Saturation 96.0 %   Acid-base deficit 5.0 (H) 0.0 - 2.0 mmol/L   Sodium 135 135 - 145 mmol/L   Potassium 4.0 3.5 - 5.1 mmol/L   Calcium, Ion 1.11 (L) 1.15 - 1.40 mmol/L   HCT 37.0 (L) 39.0 - 52.0 %   Hemoglobin 12.6 (L) 13.0 - 17.0 g/dL   Patient temperature 102.8 F    Collection site RADIAL, ALLEN'S TEST ACCEPTABLE    Drawn by RT    Sample type ARTERIAL   Troponin I (High Sensitivity)     Status: Abnormal   Collection Time: 09/19/19  5:37 AM  Result Value Ref Range   Troponin I (High Sensitivity) 1,476 (HH) <18 ng/L    Comment: CRITICAL VALUE NOTED.  VALUE IS CONSISTENT WITH PREVIOUSLY REPORTED AND CALLED VALUE. Performed at [facility], [place] [address], [place], [place] [address]   CBC     Status: Abnormal   Collection Time: 09/19/19  6:24 AM  Result Value Ref Range   WBC 26.6 (H) 4.0 - 10.5 K/uL   RBC 4.10 (L) 4.22 - 5.81 MIL/uL   Hemoglobin 12.4 (L) 13.0 - 17.0 g/dL   HCT 38.7 (L) 39.0 - 52.0 %   MCV 94.4 80.0 - 100.0 fL   MCH 30.2 26.0 - 34.0 pg   MCHC 32.0 30.0 - 36.0 g/dL   RDW 16.1 (H) 11.5 - 15.5 %   Platelets 120 (L) 150 - 400 K/uL    Comment: REPEATED TO VERIFY Immature Platelet Fraction may be clinically indicated, consider ordering this additional test [id number]    nRBC 0.0 0.0 - 0.2 %    Comment: Performed at [facility], [place] [address] [place], [place] [address]  Lactic acid, plasma     Status: Abnormal   Collection Time: 09/19/19  6:24 AM  Result Value Ref Range   Lactic Acid, Venous 2.9 (HH) 0.5 - 1.9 mmol/L    Comment: CRITICAL VALUE NOTED.  VALUE IS CONSISTENT WITH PREVIOUSLY REPORTED AND CALLED  VALUE. Performed at [facility], [place] [address] [place], [place] [address]     Recent Results (from the past 240 hour(s))  MRSA PCR Screening     Status: Abnormal   Collection Time: 09/19/19  2:34 AM   Specimen: Nasal Mucosa; Nasopharyngeal  Result Value Ref Range Status   MRSA by PCR POSITIVE (A) NEGATIVE Final    Comment: CRITICAL RESULT CALLED TO, READ BACK BY AND VERIFIED WITH: RN Mammie Russian [id number] @ [phone] [facility] Performed at [facility], [address] [place],  [address]     Lipid Panel Recent Labs    09/20/2019 1831  TRIG 139    Studies/Results: CT Code Stroke CTA Head W/WO contrast  Result Date: 10/03/2019 CLINICAL DATA:  Encephalopathy. COVID-19 EXAM: CT ANGIOGRAPHY HEAD AND NECK CT PERFUSION BRAIN TECHNIQUE: Multidetector CT imaging of the head and neck was performed using the standard protocol during bolus administration of intravenous contrast. Multiplanar CT image reconstructions and MIPs were obtained to evaluate the vascular anatomy. Carotid stenosis measurements (  when applicable) are obtained utilizing NASCET criteria, using the distal internal carotid diameter as the denominator. Multiphase CT imaging of the brain was performed following IV bolus contrast injection. Subsequent parametric perfusion maps were calculated using RAPID software. CONTRAST:  42m OMNIPAQUE IOHEXOL 350 MG/ML SOLN COMPARISON:  None. FINDINGS: CTA NECK FINDINGS SKELETON: There is no bony spinal canal stenosis. No lytic or blastic lesion. OTHER NECK: Normal pharynx, larynx and major salivary glands. No cervical lymphadenopathy. Unremarkable thyroid gland. UPPER CHEST: No pneumothorax or pleural effusion. No nodules or masses. AORTIC ARCH: There is mild calcific atherosclerosis of the aortic arch. There is no aneurysm, dissection or hemodynamically significant stenosis of the visualized portion of the aorta. Conventional 3 vessel aortic branching pattern. The visualized proximal  subclavian arteries are widely patent. RIGHT CAROTID SYSTEM: Normal without aneurysm, dissection or stenosis. LEFT CAROTID SYSTEM: Normal without aneurysm, dissection or stenosis. VERTEBRAL ARTERIES: Right dominant configuration. Both origins are clearly patent. There is no dissection, occlusion or flow-limiting stenosis to the skull base (V1-V3 segments). CTA HEAD FINDINGS POSTERIOR CIRCULATION: --Vertebral arteries: Normal V4 segments. --Posterior inferior cerebellar arteries (PICA): Patent origins from the vertebral arteries. --Anterior inferior cerebellar arteries (AICA): Patent origins from the basilar artery. --Basilar artery: Normal. --Superior cerebellar arteries: Normal. --Posterior cerebral arteries: Normal. Both originate from the basilar artery. Posterior communicating arteries (p-comm) are diminutive or absent. ANTERIOR CIRCULATION: --Intracranial internal carotid arteries: Normal. --Anterior cerebral arteries (ACA): Normal. Both A1 segments are present. Patent anterior communicating artery (a-comm). --Middle cerebral arteries (MCA): Normal. VENOUS SINUSES: As permitted by contrast timing, patent. ANATOMIC VARIANTS: None Review of the MIP images confirms the above findings. CT Brain Perfusion Findings: ASPECTS: 10 CBF (<30%) Volume: 0359mPerfusion (Tmax>6.0s) volume: 59m63mismatch Volume: 59mL63mfarction Location:None IMPRESSION: 1. No emergent large vessel occlusion or hemodynamically significant stenosis by NASCET criteria. 2. Normal cerebral CT perfusion. Electronically Signed   By: KeviUlyses Jarred.   On: 10/04/2019 22:34   CT Head Wo Contrast  Result Date: 09/17/2019 CLINICAL DATA:  Altered mental status. EXAM: CT HEAD WITHOUT CONTRAST TECHNIQUE: Contiguous axial images were obtained from the base of the skull through the vertex without intravenous contrast. COMPARISON:  None. FINDINGS: Brain: Evaluation was degraded by motion artifact. There is a 1.6 cm hypoattenuating area in the right frontal  lobe (axial series 2, image 31). There is no acute intracranial abnormality. No acute intracranial hemorrhage. There is atrophy with chronic microvascular ischemic changes. Vascular: No hyperdense vessel or unexpected calcification. Skull: Normal. Negative for fracture or focal lesion. Sinuses/Orbits: There is mucosal thickening of the left maxillary sinus with an air-fluid level. There is a complete opacification of the left sphenoid sinus. There is mucosal thickening of the ethmoid air cells. The mastoid air cells are essentially clear. Other: None. IMPRESSION: 1. Motion degraded study. 2. 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 While this is favored to be chronic, if there is clinical concern for an acute stroke follow-up with MRI is recommended. 3. No acute intracranial hemorrhage. 4. Atrophy and chronic microvascular ischemic changes are noted. 5. Sinus disease as detailed above. Electronically Signed   By: ChriConstance Holster.   On: 10/05/2019 20:24   CT Code Stroke CTA Neck W/WO contrast  Result Date: 09/30/2019 CLINICAL DATA:  Encephalopathy. COVID-19 EXAM: CT ANGIOGRAPHY HEAD AND NECK CT PERFUSION BRAIN TECHNIQUE: Multidetector CT imaging of the head and neck was performed using the standard protocol during bolus administration of intravenous contrast. Multiplanar CT image reconstructions and MIPs were obtained to  evaluate the vascular anatomy. Carotid stenosis measurements (when applicable) are obtained utilizing NASCET criteria, using the distal internal carotid diameter as the denominator. Multiphase CT imaging of the brain was performed following IV bolus contrast injection. Subsequent parametric perfusion maps were calculated using RAPID software. CONTRAST:  61m OMNIPAQUE IOHEXOL 350 MG/ML SOLN COMPARISON:  None. FINDINGS: CTA NECK FINDINGS SKELETON: There is no bony spinal canal stenosis. No lytic or blastic lesion. OTHER NECK: Normal pharynx, larynx and major salivary  glands. No cervical lymphadenopathy. Unremarkable thyroid gland. UPPER CHEST: No pneumothorax or pleural effusion. No nodules or masses. AORTIC ARCH: There is mild calcific atherosclerosis of the aortic arch. There is no aneurysm, dissection or hemodynamically significant stenosis of the visualized portion of the aorta. Conventional 3 vessel aortic branching pattern. The visualized proximal subclavian arteries are widely patent. RIGHT CAROTID SYSTEM: Normal without aneurysm, dissection or stenosis. LEFT CAROTID SYSTEM: Normal without aneurysm, dissection or stenosis. VERTEBRAL ARTERIES: Right dominant configuration. Both origins are clearly patent. There is no dissection, occlusion or flow-limiting stenosis to the skull base (V1-V3 segments). CTA HEAD FINDINGS POSTERIOR CIRCULATION: --Vertebral arteries: Normal V4 segments. --Posterior inferior cerebellar arteries (PICA): Patent origins from the vertebral arteries. --Anterior inferior cerebellar arteries (AICA): Patent origins from the basilar artery. --Basilar artery: Normal. --Superior cerebellar arteries: Normal. --Posterior cerebral arteries: Normal. Both originate from the basilar artery. Posterior communicating arteries (p-comm) are diminutive or absent. ANTERIOR CIRCULATION: --Intracranial internal carotid arteries: Normal. --Anterior cerebral arteries (ACA): Normal. Both A1 segments are present. Patent anterior communicating artery (a-comm). --Middle cerebral arteries (MCA): Normal. VENOUS SINUSES: As permitted by contrast timing, patent. ANATOMIC VARIANTS: None Review of the MIP images confirms the above findings. CT Brain Perfusion Findings: ASPECTS: 10 CBF (<30%) Volume: 035mPerfusion (Tmax>6.0s) volume: 22m22mismatch Volume: 22mL36mfarction Location:None IMPRESSION: 1. No emergent large vessel occlusion or hemodynamically significant stenosis by NASCET criteria. 2. Normal cerebral CT perfusion. Electronically Signed   By: KeviUlyses Jarred.   On:  09/23/2019 22:34   CT Code Stroke Cerebral Perfusion with contrast  Result Date: 10/05/2019 CLINICAL DATA:  Encephalopathy. COVID-19 EXAM: CT ANGIOGRAPHY HEAD AND NECK CT PERFUSION BRAIN TECHNIQUE: Multidetector CT imaging of the head and neck was performed using the standard protocol during bolus administration of intravenous contrast. Multiplanar CT image reconstructions and MIPs were obtained to evaluate the vascular anatomy. Carotid stenosis measurements (when applicable) are obtained utilizing NASCET criteria, using the distal internal carotid diameter as the denominator. Multiphase CT imaging of the brain was performed following IV bolus contrast injection. Subsequent parametric perfusion maps were calculated using RAPID software. CONTRAST:  922mL53mIPAQUE IOHEXOL 350 MG/ML SOLN COMPARISON:  None. FINDINGS: CTA NECK FINDINGS SKELETON: There is no bony spinal canal stenosis. No lytic or blastic lesion. OTHER NECK: Normal pharynx, larynx and major salivary glands. No cervical lymphadenopathy. Unremarkable thyroid gland. UPPER CHEST: No pneumothorax or pleural effusion. No nodules or masses. AORTIC ARCH: There is mild calcific atherosclerosis of the aortic arch. There is no aneurysm, dissection or hemodynamically significant stenosis of the visualized portion of the aorta. Conventional 3 vessel aortic branching pattern. The visualized proximal subclavian arteries are widely patent. RIGHT CAROTID SYSTEM: Normal without aneurysm, dissection or stenosis. LEFT CAROTID SYSTEM: Normal without aneurysm, dissection or stenosis. VERTEBRAL ARTERIES: Right dominant configuration. Both origins are clearly patent. There is no dissection, occlusion or flow-limiting stenosis to the skull base (V1-V3 segments). CTA HEAD FINDINGS POSTERIOR CIRCULATION: --Vertebral arteries: Normal V4 segments. --Posterior inferior cerebellar arteries (PICA): Patent origins from the vertebral arteries. --  Anterior inferior cerebellar arteries  (AICA): Patent origins from the basilar artery. --Basilar artery: Normal. --Superior cerebellar arteries: Normal. --Posterior cerebral arteries: Normal. Both originate from the basilar artery. Posterior communicating arteries (p-comm) are diminutive or absent. ANTERIOR CIRCULATION: --Intracranial internal carotid arteries: Normal. --Anterior cerebral arteries (ACA): Normal. Both A1 segments are present. Patent anterior communicating artery (a-comm). --Middle cerebral arteries (MCA): Normal. VENOUS SINUSES: As permitted by contrast timing, patent. ANATOMIC VARIANTS: None Review of the MIP images confirms the above findings. CT Brain Perfusion Findings: ASPECTS: 10 CBF (<30%) Volume: 69m Perfusion (Tmax>6.0s) volume: 039mMismatch Volume: 37m53mnfarction Location:None IMPRESSION: 1. No emergent large vessel occlusion or hemodynamically significant stenosis by NASCET criteria. 2. Normal cerebral CT perfusion. Electronically Signed   By: KevUlyses JarredD.   On: 10/05/2019 22:34   DG CHEST PORT 1 VIEW  Result Date: 09/19/2019 CLINICAL DATA:  Central line placement EXAM: PORTABLE CHEST 1 VIEW COMPARISON:  Yesterday FINDINGS: Left IJ line with tip at the upper SVC. Enteric and endotracheal tubes remain in good position. Airspace disease at both lung bases. Emphysema in the apical lungs. Cardiomegaly. These results will be called to the ordering clinician or representative by the Radiologist Assistant, and communication documented in the PACS or zVision Dashboard. IMPRESSION: 1. New left IJ line with tip at the upper SVC. Evidence of a trace left apical pneumothorax but this finding is stable from preprocedural radiograph yesterday and may be from patient's emphysema. Recommend radiographic follow-up. 2. Bilateral pneumonia without interval progression. 3. Cardiomegaly. Electronically Signed   By: JonMonte FantasiaD.   On: 09/19/2019 05:43   DG Chest Portable 1 View  Result Date: 09/29/2019 CLINICAL DATA:  Status post  intubation EXAM: PORTABLE CHEST 1 VIEW COMPARISON:  Film from earlier in the same day. FINDINGS: Cardiac shadow is enlarged but stable. Endotracheal tube and gastric catheter are noted in satisfactory position. Patchy opacities are noted in both lungs similar to that seen on the prior exam. No pneumothorax or sizable effusion is seen. No bony abnormality is noted. IMPRESSION: Stable appearance of the chest with the exception of interval endotracheal intubation and gastric catheter placement in satisfactory position. Electronically Signed   By: MarInez CatalinaD.   On: 10/02/2019 23:49   DG Chest Portable 1 View  Result Date: 10/07/2019 CLINICAL DATA:  Fever EXAM: PORTABLE CHEST 1 VIEW COMPARISON:  September 06, 2019 FINDINGS: The heart size and mediastinal contours are unchanged. Aortic knob calcifications. Slight interval increase in the patchy airspace opacity seen at both lung bases. There is increased interstitial markings seen at both lung bases. Hyperinflation of the upper lung zones is noted. No acute osseous abnormality. IMPRESSION: Slight interval increase in the patchy/interstitial markings at both lung bases which could be due to atelectasis and/or infectious etiology. Electronically Signed   By: BinPrudencio PairD.   On: 09/30/2019 15:29    Medications:  Scheduled: . chlorhexidine gluconate (MEDLINE KIT)  15 mL Mouth Rinse BID  . Chlorhexidine Gluconate Cloth  6 each Topical Q0600  . heparin  5,000 Units Subcutaneous Q8H  . hydrocortisone sod succinate (SOLU-CORTEF) inj  50 mg Intravenous Q12H  . levothyroxine  25 mcg Per Tube Q0600  . mouth rinse  15 mL Mouth Rinse 10 times per day  . mupirocin ointment  1 application Nasal BID  . pantoprazole (PROTONIX) IV  40 mg Intravenous QHS   Continuous: . ampicillin (OMNIPEN) IV 2 g (09/19/19 0618)  . ceFEPime (MAXIPIME) IV    . metronidazole  500 mg (09/19/19 0315)  . midazolam 0.5 mg/hr (09/19/19 8937)  . norepinephrine (LEVOPHED) Adult infusion  16 mcg/min (09/19/19 0530)  . phenylephrine (NEO-SYNEPHRINE) Adult infusion    . vancomycin Stopped (09/19/19 0107)  . vasopressin (PITRESSIN) infusion - *FOR SHOCK* 0.03 Units/min (09/19/19 0542)    Assessment: 79 year old male with acute encephalopathy in the setting of recent Covid pneumonia several weeks after initial onset. He does have a stiff neck, which coupled with his elevated procalcitonin, does increase the possibility of a bacterial meningitis as the etiology for his rapidly worsening mental status.  1. There was a question of focal deficits on exam in the ED at the time of initial consultation yesterday. Exam today reveals findings suggestive of a stroke affecting the patients RUE and RLE. However, a stroke would be unlikely to explain his overall clinical picture. 2. Nuchal rigidity with fever and white count. Fever of 102.8 overnight. Now with temp of 98.2. 3. Repeat platelet level is 120. INR mildly elevated at 1.3. Safe to obtain fluoro-guided LP at this time.   Recommendations: 1. Bacterial CNS coverage discussed with CCM overnight. Continue vancomycin, cefepime and ampicillin. 2. MRI brain 3. EEG has been ordered  35 minutes were spent in the neurological evaluation and management of this critically ill patient.    LOS: 1 day   @Electronically  signed: Dr. Kerney Elbe 09/19/2019  7:43 AM

## 2019-09-19 NOTE — Procedures (Addendum)
Patient Name: Chris Lewis  MRN: [id number]  Epilepsy Attending: Lora Havens  Referring Physician/Provider: Dr. Kathrynn Speed Date: 09/19/2019 Duration: 22.31 mins  Patient history: 79 year old male presented with acute encephalopathy in the setting of recent Covid pneumonia.  EEG to evaluate for seizures.  Level of alertness: Comatose  AEDs during EEG study: Versed  Technical aspects: This EEG study was done with scalp electrodes positioned according to the 10-20 International system of electrode placement. Electrical activity was acquired at a sampling rate of 500Hz  and reviewed with a high frequency filter of 70Hz  and a low frequency filter of 1Hz . EEG data were recorded continuously and digitally stored.   Description: EEG showed continuous generalized 2 to 3 Hz delta slowing as well as intermittent generalized, maximal frontocentral 13 to 15 Hz beta activity.  EEG was reactive to tactile stimulation.  Hyperventilation photic stimulation were not performed due to AMS.  Abnormality -Continuous slow, generalized  IMPRESSION: This study is suggestive of severe diffuse encephalopathy, nonspecific etiology but could be secondary to sedation. No seizures or epileptiform discharges were seen throughout the recording.    Farah Lepak Barbra Sarks

## 2019-09-19 NOTE — Progress Notes (Signed)
Evaluated patient in early am.  Partially sedated on vent, chewing on ET tube.   BP borderline (MAP 50s) on levophed 8 peripherally.  Placed Central line for pressors.  Bedside echo reveals poor EF.  No phenylephrine.  Will increase levophed as needed, add epi if need more.  Did not respond to small fluid bolus.   Stop precedex (perhaps dropping hr too much for him? 120s--?80 in setting of poor EF).  Trial of versed for sedation, fentanyl.

## 2019-09-19 NOTE — Progress Notes (Signed)
Initial Nutrition Assessment  DOCUMENTATION CODES:   Not applicable  INTERVENTION:   Tube Feeding:  Vital 1.5 at 60 ml/hr Pro-Stat 30 mL BID If patient placed in prone position, TF to be decreased to 40 ml/hr and then increased back to goal rate once returned to supine position  TF regimen provides 2360 kcals, 127 g of protein and 1094 mL of fluid Meets 100% of estimated protein and calorie needs  Add free water flush of 200 mL q 6 hours: total of 1894 mL of free water. Monitor sodium trend, renal function  NUTRITION DIAGNOSIS:   Inadequate oral intake related to acute illness as evidenced by NPO status.  GOAL:   Patient will meet greater than or equal to 90% of their needs  MONITOR:   Vent status, TF tolerance, Labs, Weight trends  REASON FOR ASSESSMENT:   Consult, Ventilator Assessment of nutrition requirement/status  ASSESSMENT:   79 yo male admitted with acute encephalopathy, acute respiratory failure requiring intubation with recent admission for COVID 19 pneumonia from 1/12 to 1/25 with hx of COPD, severe emphysema, AKI. PMH includes HLD, depression, HTN   1/12-1/25 Admission to [facility] for COVID 19 pneumonia 2/2 Admit, Intubated  Patient is currently intubated on ventilator support, sedated on fentanyl and versed; requiring levophed and vasopressin MV: 19.2 L/min Temp (24hrs), Avg:99.4 F (37.4 C), Min:98.2 F (36.8 C), Max:102.8 F (39.3 C)  Unable to obtain diet and weight history at this time  Current weight 80.6 kg; no recent weight loss per weight encounters  Per chest xray report, OG tube in good position  Labs: revewed Meds: reviewed  Diet Order:   Diet Order    None      EDUCATION NEEDS:   Not appropriate for education at this time  Skin:  Skin Assessment: Skin Integrity Issues: Skin Integrity Issues:: Stage II Stage II: buttocks  Last BM:  no BM yet this admission  Height:   Ht Readings from Last 1 Encounters:  09/20/2019 5\' 8"   (1.727 m)    Weight:   Wt Readings from Last 1 Encounters:  09/19/19 80.6 kg   BMI:  Body mass index is 27.02 kg/m.  Estimated Nutritional Needs:   Kcal:  2380 kcals (PSU), [phone] kcals (kcals/kg)  Protein:  120-150 g  Fluid:  >/= 2 L   Cate Rod Majerus MS, RDN, LDN, CNSC [phone] Pager  [phone] Weekend/On-Call Pager

## 2019-09-19 NOTE — Progress Notes (Signed)
[facility] Progress Note Patient Name: TOMMAS SUITER DOB: 1941-08-08 MRN: [id number]   Date of Service  09/19/2019  HPI/Events of Note  Multiple issues: 1. Fever to 102.4 - Patient already on Vancomycin, Ampicillin and Cefepime. AST = 55, therefore, can't use Tylenol. Creatinine = 1.49, therefore, can't use Motrin. 2. ABG = 7.26/79.2/245.0. TV increased to 600 from 550. No repeat ABG ordered.   eICU Interventions  Will order: 1. Cooling blanket PRN.  2. ABG at 5 AM.      Intervention Category Major Interventions: Other:  Aldonia Keeven Cornelia Copa 09/19/2019, 3:02 AM

## 2019-09-19 NOTE — Procedures (Signed)
Central Venous Catheter Insertion Procedure Note STERLING KEARNEY [id number] September 28, 1940  Procedure: Insertion of Central Venous Catheter Indications: Drug and/or fluid administration  Procedure Details Unable to consent, emergent. Time Out: Verified patient identification, verified procedure, site/side was marked, verified correct patient position, special equipment/implants available, medications/allergies/relevent history reviewed, required imaging and test results available.  Performed  Maximum sterile technique was used including antiseptics, cap, gloves, gown, hand hygiene, mask and sheet. Skin prep: Chlorhexidine; local anesthetic administered A antimicrobial bonded/coated triple lumen catheter was placed in the left internal jugular vein using the Seldinger technique.  Evaluation Blood flow good.  Success on second attempt (unable to thread wire on first) Complications: No apparent complications Patient did tolerate procedure well. Chest X-ray ordered to verify placement.  CXR: normal. Line in good position.   Collier Bullock 09/19/2019, 6:52 AM

## 2019-09-19 NOTE — Procedures (Signed)
Lumbar Puncture Procedure Note  Pre-operative Diagnosis: r/o meningitis  Post-operative Diagnosis: r/o meningitis  Indications: Diagnostic  Procedure Details   Consent: Informed consent was obtained. Risks of the procedure were discussed including: infection, bleeding, pain and headache.  The patient was positioned under sterile conditions. Betadine solution and sterile drapes were utilized. A spinal needle was inserted at the L1 - L2 interspace.  Spinal fluid was obtained and sent to the laboratory.  Findings 71mL of clear spinal fluid was obtained.   Complications:  None; patient tolerated the procedure well.        Condition: stable

## 2019-09-19 NOTE — Progress Notes (Signed)
Seen examined. Opens eyes to voice. Cannot get to follow any commands or withdraw to pain. LP performed MRI/EEG pending Wife updated by phone

## 2019-09-19 NOTE — Progress Notes (Signed)
Sputum ordered. RT attempted to get sputum through lin-line suction. Nothing in return. RT left sputum trap in-line and will send down to lab when able to get it.

## 2019-09-20 ENCOUNTER — Other Ambulatory Visit (HOSPITAL_COMMUNITY): Payer: Medicare Other

## 2019-09-20 ENCOUNTER — Inpatient Hospital Stay (HOSPITAL_COMMUNITY): Payer: BC Managed Care – PPO

## 2019-09-20 DIAGNOSIS — I6310 Cerebral infarction due to embolism of unspecified precerebral artery: Secondary | ICD-10-CM | POA: Diagnosis not present

## 2019-09-20 DIAGNOSIS — J189 Pneumonia, unspecified organism: Secondary | ICD-10-CM | POA: Diagnosis not present

## 2019-09-20 DIAGNOSIS — I2609 Other pulmonary embolism with acute cor pulmonale: Secondary | ICD-10-CM | POA: Diagnosis not present

## 2019-09-20 DIAGNOSIS — I5082 Biventricular heart failure: Secondary | ICD-10-CM | POA: Diagnosis not present

## 2019-09-20 DIAGNOSIS — Z9911 Dependence on respirator [ventilator] status: Secondary | ICD-10-CM | POA: Diagnosis not present

## 2019-09-20 DIAGNOSIS — A4902 Methicillin resistant Staphylococcus aureus infection, unspecified site: Secondary | ICD-10-CM | POA: Diagnosis not present

## 2019-09-20 DIAGNOSIS — I6340 Cerebral infarction due to embolism of unspecified cerebral artery: Secondary | ICD-10-CM | POA: Insufficient documentation

## 2019-09-20 DIAGNOSIS — I82409 Acute embolism and thrombosis of unspecified deep veins of unspecified lower extremity: Secondary | ICD-10-CM | POA: Diagnosis not present

## 2019-09-20 DIAGNOSIS — A419 Sepsis, unspecified organism: Secondary | ICD-10-CM | POA: Diagnosis not present

## 2019-09-20 DIAGNOSIS — I517 Cardiomegaly: Secondary | ICD-10-CM | POA: Diagnosis not present

## 2019-09-20 DIAGNOSIS — J9601 Acute respiratory failure with hypoxia: Secondary | ICD-10-CM | POA: Diagnosis not present

## 2019-09-20 LAB — BPAM PLATELET PHERESIS
Blood Product Expiration Date: 202102042359
Blood Product Expiration Date: 202102042359
ISSUE DATE / TIME: 202102030439
ISSUE DATE / TIME: 202102030439
Unit Type and Rh: 6200
Unit Type and Rh: 6200

## 2019-09-20 LAB — CBC
HCT: 37.3 % — ABNORMAL LOW (ref 39.0–52.0)
Hemoglobin: 12.2 g/dL — ABNORMAL LOW (ref 13.0–17.0)
MCH: 30.7 pg (ref 26.0–34.0)
MCHC: 32.7 g/dL (ref 30.0–36.0)
MCV: 94 fL (ref 80.0–100.0)
Platelets: UNDETERMINED 10*3/uL (ref 150–400)
RBC: 3.97 MIL/uL — ABNORMAL LOW (ref 4.22–5.81)
RDW: 16.4 % — ABNORMAL HIGH (ref 11.5–15.5)
WBC: 19.5 10*3/uL — ABNORMAL HIGH (ref 4.0–10.5)
nRBC: 0.2 % (ref 0.0–0.2)

## 2019-09-20 LAB — PREPARE PLATELET PHERESIS
Unit division: 0
Unit division: 0

## 2019-09-20 LAB — GLUCOSE, CAPILLARY
Glucose-Capillary: 204 mg/dL — ABNORMAL HIGH (ref 70–99)
Glucose-Capillary: 223 mg/dL — ABNORMAL HIGH (ref 70–99)
Glucose-Capillary: 227 mg/dL — ABNORMAL HIGH (ref 70–99)
Glucose-Capillary: 228 mg/dL — ABNORMAL HIGH (ref 70–99)
Glucose-Capillary: 230 mg/dL — ABNORMAL HIGH (ref 70–99)
Glucose-Capillary: 294 mg/dL — ABNORMAL HIGH (ref 70–99)

## 2019-09-20 LAB — LEGIONELLA PNEUMOPHILA SEROGP 1 UR AG: L. pneumophila Serogp 1 Ur Ag: NEGATIVE

## 2019-09-20 LAB — PHOSPHORUS
Phosphorus: 3.7 mg/dL (ref 2.5–4.6)
Phosphorus: 2.4 mg/dL — ABNORMAL LOW (ref 2.5–4.6)

## 2019-09-20 LAB — BASIC METABOLIC PANEL
Anion gap: 10 (ref 5–15)
BUN: 48 mg/dL — ABNORMAL HIGH (ref 8–23)
CO2: 20 mmol/L — ABNORMAL LOW (ref 22–32)
Calcium: 7.2 mg/dL — ABNORMAL LOW (ref 8.9–10.3)
Chloride: 110 mmol/L (ref 98–111)
Creatinine, Ser: 1.6 mg/dL — ABNORMAL HIGH (ref 0.61–1.24)
GFR calc Af Amer: 46 mL/min — ABNORMAL LOW (ref >60)
GFR calc non Af Amer: 40 mL/min — ABNORMAL LOW (ref >60)
Glucose, Bld: 258 mg/dL — ABNORMAL HIGH (ref 70–99)
Potassium: 3.7 mmol/L (ref 3.5–5.1)
Sodium: 140 mmol/L (ref 135–145)

## 2019-09-20 LAB — MAGNESIUM
Magnesium: 1.9 mg/dL (ref 1.7–2.4)
Magnesium: 2.3 mg/dL (ref 1.7–2.4)

## 2019-09-20 LAB — ECHOCARDIOGRAM LIMITED
Height: 68 in
Weight: 2843.05 oz

## 2019-09-20 LAB — HEMOGLOBIN A1C
Hgb A1c MFr Bld: 6.8 % — ABNORMAL HIGH (ref 4.8–5.6)
Mean Plasma Glucose: 148.46 mg/dL

## 2019-09-20 LAB — URINE CULTURE: Culture: >=100000 — AB

## 2019-09-20 LAB — COOXEMETRY PANEL
Carboxyhemoglobin: 1.5 % (ref 0.5–1.5)
Methemoglobin: 0.9 % (ref 0.0–1.5)
O2 Saturation: 75.1 %
Total hemoglobin: 12.4 g/dL (ref 12.0–16.0)

## 2019-09-20 LAB — LACTIC ACID, PLASMA: Lactic Acid, Venous: 2.7 mmol/L (ref 0.5–1.9)

## 2019-09-20 MED ORDER — INSULIN ASPART 100 UNIT/ML ~~LOC~~ SOLN
0.0000 [IU] | SUBCUTANEOUS | Status: DC
  Administered 2019-09-20: 8 [IU] via SUBCUTANEOUS
  Administered 2019-09-20 (×6): 5 [IU] via SUBCUTANEOUS
  Administered 2019-09-21: 12:00:00 3 [IU] via SUBCUTANEOUS
  Administered 2019-09-21: 04:00:00 5 [IU] via SUBCUTANEOUS
  Administered 2019-09-21: 2 [IU] via SUBCUTANEOUS
  Administered 2019-09-21: 3 [IU] via SUBCUTANEOUS
  Administered 2019-09-21: 5 [IU] via SUBCUTANEOUS
  Administered 2019-09-21 – 2019-09-22 (×3): 3 [IU] via SUBCUTANEOUS
  Administered 2019-09-22 – 2019-09-23 (×4): 5 [IU] via SUBCUTANEOUS
  Administered 2019-09-23: 2 [IU] via SUBCUTANEOUS
  Administered 2019-09-23 (×2): 3 [IU] via SUBCUTANEOUS
  Administered 2019-09-23 (×2): 5 [IU] via SUBCUTANEOUS
  Administered 2019-09-23 – 2019-09-24 (×2): 3 [IU] via SUBCUTANEOUS
  Administered 2019-09-24: 2 [IU] via SUBCUTANEOUS
  Administered 2019-09-24: 08:00:00 5 [IU] via SUBCUTANEOUS
  Administered 2019-09-24 (×2): 3 [IU] via SUBCUTANEOUS
  Administered 2019-09-24: 12:00:00 5 [IU] via SUBCUTANEOUS
  Administered 2019-09-25 – 2019-09-27 (×6): 2 [IU] via SUBCUTANEOUS
  Administered 2019-09-27: 3 [IU] via SUBCUTANEOUS
  Administered 2019-09-27 – 2019-09-29 (×5): 2 [IU] via SUBCUTANEOUS
  Administered 2019-09-30 (×2): 3 [IU] via SUBCUTANEOUS
  Administered 2019-09-30: 21:00:00 2 [IU] via SUBCUTANEOUS
  Administered 2019-09-30: 3 [IU] via SUBCUTANEOUS
  Administered 2019-09-30 – 2019-10-01 (×3): 2 [IU] via SUBCUTANEOUS

## 2019-09-20 MED ORDER — PANTOPRAZOLE SODIUM 40 MG PO PACK
40.0000 mg | PACK | Freq: Every day | ORAL | Status: DC
  Administered 2019-09-20 – 2019-09-30 (×11): 40 mg
  Filled 2019-09-20 (×12): qty 20

## 2019-09-20 MED ORDER — ASPIRIN 325 MG PO TABS
325.0000 mg | ORAL_TABLET | Freq: Every day | ORAL | Status: DC
  Administered 2019-09-20 – 2019-09-22 (×3): 325 mg
  Filled 2019-09-20 (×3): qty 1

## 2019-09-20 MED ORDER — MAGNESIUM SULFATE IN D5W 1-5 GM/100ML-% IV SOLN
1.0000 g | Freq: Once | INTRAVENOUS | Status: AC
  Administered 2019-09-20: 1 g via INTRAVENOUS
  Filled 2019-09-20: qty 100

## 2019-09-20 MED ORDER — HYDROCORTISONE NA SUCCINATE PF 100 MG IJ SOLR
50.0000 mg | Freq: Four times a day (QID) | INTRAMUSCULAR | Status: DC
  Administered 2019-09-20 – 2019-09-24 (×16): 50 mg via INTRAVENOUS
  Filled 2019-09-20 (×16): qty 2

## 2019-09-20 MED ORDER — METOPROLOL TARTRATE 5 MG/5ML IV SOLN
2.5000 mg | Freq: Once | INTRAVENOUS | Status: AC
  Administered 2019-09-20: 2.5 mg via INTRAVENOUS
  Filled 2019-09-20: qty 5

## 2019-09-20 MED ORDER — DEXMEDETOMIDINE HCL IN NACL 400 MCG/100ML IV SOLN
0.4000 ug/kg/h | INTRAVENOUS | Status: DC
  Administered 2019-09-20: 0.9 ug/kg/h via INTRAVENOUS
  Administered 2019-09-20: 0.5 ug/kg/h via INTRAVENOUS
  Administered 2019-09-21 (×2): 0.6 ug/kg/h via INTRAVENOUS
  Administered 2019-09-21: 03:00:00 0.9 ug/kg/h via INTRAVENOUS
  Administered 2019-09-22: 02:00:00 0.6 ug/kg/h via INTRAVENOUS
  Administered 2019-09-22: 16:00:00 0.4 ug/kg/h via INTRAVENOUS
  Administered 2019-09-24: 15:00:00 0.8 ug/kg/h via INTRAVENOUS
  Administered 2019-09-24: 0.6 ug/kg/h via INTRAVENOUS
  Administered 2019-09-25 (×2): 0.4 ug/kg/h via INTRAVENOUS
  Administered 2019-09-25: 0.9 ug/kg/h via INTRAVENOUS
  Administered 2019-09-26 (×2): 0.8 ug/kg/h via INTRAVENOUS
  Administered 2019-09-26: 0.7 ug/kg/h via INTRAVENOUS
  Administered 2019-09-26 – 2019-09-27 (×4): 0.8 ug/kg/h via INTRAVENOUS
  Administered 2019-09-27: 0.4 ug/kg/h via INTRAVENOUS
  Administered 2019-09-28: 0.8 ug/kg/h via INTRAVENOUS
  Filled 2019-09-20 (×21): qty 100

## 2019-09-20 MED ORDER — SODIUM CHLORIDE 0.9 % IV SOLN: INTRAVENOUS | Status: DC | PRN

## 2019-09-20 MED ORDER — INSULIN GLARGINE 100 UNIT/ML ~~LOC~~ SOLN
5.0000 [IU] | Freq: Every day | SUBCUTANEOUS | Status: DC
  Administered 2019-09-20 – 2019-09-22 (×3): 5 [IU] via SUBCUTANEOUS
  Filled 2019-09-20 (×3): qty 0.05

## 2019-09-20 NOTE — Progress Notes (Signed)
STROKE TEAM PROGRESS NOTE   INTERVAL HISTORY I have reviewed history of presenting illness, electronic medical records and imaging films in PACS.  Patient remains intubated for respiratory failure and is off sedation from this morning but remains poorly responsive.  He is on vasopressor support and has low-grade fever and white count is coming down to 19.5.  He is on antibiotics.  Urine cultures show UTI but spinal fluid is suggestive of aseptic meningitis with near normal protein, normal glucose and mildly elevated white cells.  2D echo shows bihemispheric dysfunction with poor ejection fraction but no definite clot.  EEG shows mild generalized slowing consistent consistent with encephalopathy.  No epileptiform activity was noted.  Vitals:   09/20/19 1530 09/20/19 1545 09/20/19 1600 09/20/19 1615  BP: 121/80 120/72 118/79 122/73  Pulse: 97 99 (!) 101 (!) 102  Resp: (!) 28 (!) 26 (!) 28 (!) 25  Temp:   99.4 F (37.4 C)   TempSrc:   Oral   SpO2: 90% 90% 90% 91%  Weight:      Height:        CBC:  Recent Labs  Lab 09/17/2019 1545 10/13/2019 1824 09/19/19 0624 09/19/19 0624 09/19/19 0800 09/20/19 0922  WBC 13.5*   < > 26.6*  --   --  19.5*  NEUTROABS 12.5*  --   --   --   --   --   HGB 13.8   < > 12.4*  --   --  12.2*  HCT 41.9   < > 38.7*  --   --  37.3*  MCV 92.5   < > 94.4  --   --  94.0  PLT 56*   < > 120*   < > 120* PLATELET CLUMPS NOTED ON SMEAR, UNABLE TO ESTIMATE   < > = values in this interval not displayed.    Basic Metabolic Panel:  Recent Labs  Lab 09/19/19 0624 09/19/19 1844 09/20/19 0129 09/20/19 0922  NA 137  --   --  140  K 4.7  --   --  3.7  CL 103  --   --  110  CO2 20*  --   --  20*  GLUCOSE 172*  --   --  258*  BUN 30*  --   --  48*  CREATININE 1.83*  --   --  1.60*  CALCIUM 7.1*  --   --  7.2*  MG  --  1.9 1.9  --   PHOS  --  4.6 3.7  --    Lipid Panel:     Component Value Date/Time   CHOL 214 (H) 08/29/2018 1209   TRIG 139 10/05/2019 1831   HDL  32.20 (L) 08/29/2018 1209   CHOLHDL 7 08/29/2018 1209   VLDL 29.2 08/29/2018 1209   LDLCALC 153 (H) 08/29/2018 1209   HgbA1c:  Lab Results  Component Value Date   HGBA1C 6.8 (H) 09/20/2019   Urine Drug Screen:     Component Value Date/Time   LABOPIA NONE DETECTED 10/27/2015 2210   COCAINSCRNUR NONE DETECTED 10/27/2015 2210   LABBENZ NONE DETECTED 10/27/2015 2210   AMPHETMU NONE DETECTED 10/27/2015 2210   THCU NONE DETECTED 10/27/2015 2210   LABBARB NONE DETECTED 10/27/2015 2210    Alcohol Level     Component Value Date/Time   ETH <5 10/27/2015 1855    IMAGING past 48 hours CT Code Stroke CTA Head W/WO contrast  Result Date: 09/21/2019 CLINICAL DATA:  Encephalopathy. COVID-19 EXAM:  CT ANGIOGRAPHY HEAD AND NECK CT PERFUSION BRAIN TECHNIQUE: Multidetector CT imaging of the head and neck was performed using the standard protocol during bolus administration of intravenous contrast. Multiplanar CT image reconstructions and MIPs were obtained to evaluate the vascular anatomy. Carotid stenosis measurements (when applicable) are obtained utilizing NASCET criteria, using the distal internal carotid diameter as the denominator. Multiphase CT imaging of the brain was performed following IV bolus contrast injection. Subsequent parametric perfusion maps were calculated using RAPID software. CONTRAST:  27mL OMNIPAQUE IOHEXOL 350 MG/ML SOLN COMPARISON:  None. FINDINGS: CTA NECK FINDINGS SKELETON: There is no bony spinal canal stenosis. No lytic or blastic lesion. OTHER NECK: Normal pharynx, larynx and major salivary glands. No cervical lymphadenopathy. Unremarkable thyroid gland. UPPER CHEST: No pneumothorax or pleural effusion. No nodules or masses. AORTIC ARCH: There is mild calcific atherosclerosis of the aortic arch. There is no aneurysm, dissection or hemodynamically significant stenosis of the visualized portion of the aorta. Conventional 3 vessel aortic branching pattern. The visualized proximal  subclavian arteries are widely patent. RIGHT CAROTID SYSTEM: Normal without aneurysm, dissection or stenosis. LEFT CAROTID SYSTEM: Normal without aneurysm, dissection or stenosis. VERTEBRAL ARTERIES: Right dominant configuration. Both origins are clearly patent. There is no dissection, occlusion or flow-limiting stenosis to the skull base (V1-V3 segments). CTA HEAD FINDINGS POSTERIOR CIRCULATION: --Vertebral arteries: Normal V4 segments. --Posterior inferior cerebellar arteries (PICA): Patent origins from the vertebral arteries. --Anterior inferior cerebellar arteries (AICA): Patent origins from the basilar artery. --Basilar artery: Normal. --Superior cerebellar arteries: Normal. --Posterior cerebral arteries: Normal. Both originate from the basilar artery. Posterior communicating arteries (p-comm) are diminutive or absent. ANTERIOR CIRCULATION: --Intracranial internal carotid arteries: Normal. --Anterior cerebral arteries (ACA): Normal. Both A1 segments are present. Patent anterior communicating artery (a-comm). --Middle cerebral arteries (MCA): Normal. VENOUS SINUSES: As permitted by contrast timing, patent. ANATOMIC VARIANTS: None Review of the MIP images confirms the above findings. CT Brain Perfusion Findings: ASPECTS: 10 CBF (<30%) Volume: 44mL Perfusion (Tmax>6.0s) volume: 40mL Mismatch Volume: 52mL Infarction Location:None IMPRESSION: 1. No emergent large vessel occlusion or hemodynamically significant stenosis by NASCET criteria. 2. Normal cerebral CT perfusion. Electronically Signed   By: Ulyses Jarred M.D.   On: 09/30/2019 22:34   CT Head Wo Contrast  Result Date: 10/12/2019 CLINICAL DATA:  Altered mental status. EXAM: CT HEAD WITHOUT CONTRAST TECHNIQUE: Contiguous axial images were obtained from the base of the skull through the vertex without intravenous contrast. COMPARISON:  None. FINDINGS: Brain: Evaluation was degraded by motion artifact. There is a 1.6 cm hypoattenuating area in the right frontal  lobe (axial series 2, image 31). There is no acute intracranial abnormality. No acute intracranial hemorrhage. There is atrophy with chronic microvascular ischemic changes. Vascular: No hyperdense vessel or unexpected calcification. Skull: Normal. Negative for fracture or focal lesion. Sinuses/Orbits: There is mucosal thickening of the left maxillary sinus with an air-fluid level. There is a complete opacification of the left sphenoid sinus. There is mucosal thickening of the ethmoid air cells. The mastoid air cells are essentially clear. Other: None. IMPRESSION: 1. Motion degraded study. 2. 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 While this is favored to be chronic, if there is clinical concern for an acute stroke follow-up with MRI is recommended. 3. No acute intracranial hemorrhage. 4. Atrophy and chronic microvascular ischemic changes are noted. 5. Sinus disease as detailed above. Electronically Signed   By: Constance Holster M.D.   On: 10/02/2019 20:24   CT Code Stroke CTA Neck W/WO contrast  Result Date:  09/29/2019 CLINICAL DATA:  Encephalopathy. COVID-19 EXAM: CT ANGIOGRAPHY HEAD AND NECK CT PERFUSION BRAIN TECHNIQUE: Multidetector CT imaging of the head and neck was performed using the standard protocol during bolus administration of intravenous contrast. Multiplanar CT image reconstructions and MIPs were obtained to evaluate the vascular anatomy. Carotid stenosis measurements (when applicable) are obtained utilizing NASCET criteria, using the distal internal carotid diameter as the denominator. Multiphase CT imaging of the brain was performed following IV bolus contrast injection. Subsequent parametric perfusion maps were calculated using RAPID software. CONTRAST:  50mL OMNIPAQUE IOHEXOL 350 MG/ML SOLN COMPARISON:  None. FINDINGS: CTA NECK FINDINGS SKELETON: There is no bony spinal canal stenosis. No lytic or blastic lesion. OTHER NECK: Normal pharynx, larynx and major salivary  glands. No cervical lymphadenopathy. Unremarkable thyroid gland. UPPER CHEST: No pneumothorax or pleural effusion. No nodules or masses. AORTIC ARCH: There is mild calcific atherosclerosis of the aortic arch. There is no aneurysm, dissection or hemodynamically significant stenosis of the visualized portion of the aorta. Conventional 3 vessel aortic branching pattern. The visualized proximal subclavian arteries are widely patent. RIGHT CAROTID SYSTEM: Normal without aneurysm, dissection or stenosis. LEFT CAROTID SYSTEM: Normal without aneurysm, dissection or stenosis. VERTEBRAL ARTERIES: Right dominant configuration. Both origins are clearly patent. There is no dissection, occlusion or flow-limiting stenosis to the skull base (V1-V3 segments). CTA HEAD FINDINGS POSTERIOR CIRCULATION: --Vertebral arteries: Normal V4 segments. --Posterior inferior cerebellar arteries (PICA): Patent origins from the vertebral arteries. --Anterior inferior cerebellar arteries (AICA): Patent origins from the basilar artery. --Basilar artery: Normal. --Superior cerebellar arteries: Normal. --Posterior cerebral arteries: Normal. Both originate from the basilar artery. Posterior communicating arteries (p-comm) are diminutive or absent. ANTERIOR CIRCULATION: --Intracranial internal carotid arteries: Normal. --Anterior cerebral arteries (ACA): Normal. Both A1 segments are present. Patent anterior communicating artery (a-comm). --Middle cerebral arteries (MCA): Normal. VENOUS SINUSES: As permitted by contrast timing, patent. ANATOMIC VARIANTS: None Review of the MIP images confirms the above findings. CT Brain Perfusion Findings: ASPECTS: 10 CBF (<30%) Volume: 45mL Perfusion (Tmax>6.0s) volume: 46mL Mismatch Volume: 63mL Infarction Location:None IMPRESSION: 1. No emergent large vessel occlusion or hemodynamically significant stenosis by NASCET criteria. 2. Normal cerebral CT perfusion. Electronically Signed   By: Ulyses Jarred M.D.   On:  10/06/2019 22:34   MR BRAIN WO CONTRAST  Result Date: 09/19/2019 CLINICAL DATA:  Encephalopathy, COVID-19 EXAM: MRI HEAD WITHOUT CONTRAST TECHNIQUE: Multiplanar, multiecho pulse sequences of the brain and surrounding structures were obtained without intravenous contrast. COMPARISON:  Correlation made with recent CT imaging FINDINGS: Brain: Multiple small foci reduced diffusion are present involving bilateral cerebral and cerebellar hemispheres. There is no evidence intracranial hemorrhage. Chronic infarction involving the right middle frontal gyrus. Additional patchy T2 hyperintensity in the supratentorial white matter is nonspecific but may reflect mild chronic microvascular ischemic changes. Prominence of the ventricles and sulci reflects mild generalized parenchymal volume loss. Punctate focus of susceptibility in the parasagittal left parietal lobe is compatible with chronic microhemorrhage. There is no intracranial mass, mass effect, or edema. There is no hydrocephalus or extra-axial fluid collection. Vascular: Major vessel flow voids at the skull base are preserved. Skull and upper cervical spine: Normal marrow signal is preserved. Sinuses/Orbits: Near total opacification of the sphenoid sinus. Additional left posterior ethmoid and maxillary sinus partial opacification. Bilateral lens replacements. Other: Sella is unremarkable.  Patchy mastoid fluid opacification. IMPRESSION: Multiple small acute infarcts involving bilateral anterior and posterior circulations. Chronic right frontal infarct. Mild chronic microvascular ischemic changes. Electronically Signed   By: Addison Lank.D.  On: 09/19/2019 13:48   CT Code Stroke Cerebral Perfusion with contrast  Result Date: 10/02/2019 CLINICAL DATA:  Encephalopathy. COVID-19 EXAM: CT ANGIOGRAPHY HEAD AND NECK CT PERFUSION BRAIN TECHNIQUE: Multidetector CT imaging of the head and neck was performed using the standard protocol during bolus administration of  intravenous contrast. Multiplanar CT image reconstructions and MIPs were obtained to evaluate the vascular anatomy. Carotid stenosis measurements (when applicable) are obtained utilizing NASCET criteria, using the distal internal carotid diameter as the denominator. Multiphase CT imaging of the brain was performed following IV bolus contrast injection. Subsequent parametric perfusion maps were calculated using RAPID software. CONTRAST:  20mL OMNIPAQUE IOHEXOL 350 MG/ML SOLN COMPARISON:  None. FINDINGS: CTA NECK FINDINGS SKELETON: There is no bony spinal canal stenosis. No lytic or blastic lesion. OTHER NECK: Normal pharynx, larynx and major salivary glands. No cervical lymphadenopathy. Unremarkable thyroid gland. UPPER CHEST: No pneumothorax or pleural effusion. No nodules or masses. AORTIC ARCH: There is mild calcific atherosclerosis of the aortic arch. There is no aneurysm, dissection or hemodynamically significant stenosis of the visualized portion of the aorta. Conventional 3 vessel aortic branching pattern. The visualized proximal subclavian arteries are widely patent. RIGHT CAROTID SYSTEM: Normal without aneurysm, dissection or stenosis. LEFT CAROTID SYSTEM: Normal without aneurysm, dissection or stenosis. VERTEBRAL ARTERIES: Right dominant configuration. Both origins are clearly patent. There is no dissection, occlusion or flow-limiting stenosis to the skull base (V1-V3 segments). CTA HEAD FINDINGS POSTERIOR CIRCULATION: --Vertebral arteries: Normal V4 segments. --Posterior inferior cerebellar arteries (PICA): Patent origins from the vertebral arteries. --Anterior inferior cerebellar arteries (AICA): Patent origins from the basilar artery. --Basilar artery: Normal. --Superior cerebellar arteries: Normal. --Posterior cerebral arteries: Normal. Both originate from the basilar artery. Posterior communicating arteries (p-comm) are diminutive or absent. ANTERIOR CIRCULATION: --Intracranial internal carotid  arteries: Normal. --Anterior cerebral arteries (ACA): Normal. Both A1 segments are present. Patent anterior communicating artery (a-comm). --Middle cerebral arteries (MCA): Normal. VENOUS SINUSES: As permitted by contrast timing, patent. ANATOMIC VARIANTS: None Review of the MIP images confirms the above findings. CT Brain Perfusion Findings: ASPECTS: 10 CBF (<30%) Volume: 70mL Perfusion (Tmax>6.0s) volume: 49mL Mismatch Volume: 11mL Infarction Location:None IMPRESSION: 1. No emergent large vessel occlusion or hemodynamically significant stenosis by NASCET criteria. 2. Normal cerebral CT perfusion. Electronically Signed   By: Ulyses Jarred M.D.   On: 09/27/2019 22:34   DG CHEST PORT 1 VIEW  Result Date: 09/19/2019 CLINICAL DATA:  Central line placement EXAM: PORTABLE CHEST 1 VIEW COMPARISON:  Yesterday FINDINGS: Left IJ line with tip at the upper SVC. Enteric and endotracheal tubes remain in good position. Airspace disease at both lung bases. Emphysema in the apical lungs. Cardiomegaly. These results will be called to the ordering clinician or representative by the Radiologist Assistant, and communication documented in the PACS or zVision Dashboard. IMPRESSION: 1. New left IJ line with tip at the upper SVC. Evidence of a trace left apical pneumothorax but this finding is stable from preprocedural radiograph yesterday and may be from patient's emphysema. Recommend radiographic follow-up. 2. Bilateral pneumonia without interval progression. 3. Cardiomegaly. Electronically Signed   By: Monte Fantasia M.D.   On: 09/19/2019 05:43   DG Chest Portable 1 View  Result Date: 09/24/2019 CLINICAL DATA:  Status post intubation EXAM: PORTABLE CHEST 1 VIEW COMPARISON:  Film from earlier in the same day. FINDINGS: Cardiac shadow is enlarged but stable. Endotracheal tube and gastric catheter are noted in satisfactory position. Patchy opacities are noted in both lungs similar to that seen on the prior  exam. No pneumothorax or  sizable effusion is seen. No bony abnormality is noted. IMPRESSION: Stable appearance of the chest with the exception of interval endotracheal intubation and gastric catheter placement in satisfactory position. Electronically Signed   By: Inez Catalina M.D.   On: 10/09/2019 23:49   EEG adult  Result Date: 09/19/2019 Lora Havens, MD     09/19/2019  3:39 PM Patient Name: Chris Lewis MRN: [id number] Epilepsy Attending: Lora Havens Referring Physician/Provider: Dr. Kathrynn Speed Date: 09/19/2019 Duration: 22.31 mins Patient history: 79 year old male presented with acute encephalopathy in the setting of recent Covid pneumonia.  EEG to evaluate for seizures. Level of alertness: Comatose AEDs during EEG study: Versed Technical aspects: This EEG study was done with scalp electrodes positioned according to the 10-20 International system of electrode placement. Electrical activity was acquired at a sampling rate of 500Hz  and reviewed with a high frequency filter of 70Hz  and a low frequency filter of 1Hz . EEG data were recorded continuously and digitally stored. Description: EEG showed continuous generalized 2 to 3 Hz delta slowing as well as intermittent generalized, maximal frontocentral 13 to 15 Hz beta activity.  EEG was reactive to tactile stimulation.  Hyperventilation photic stimulation were not performed due to AMS. Abnormality -Continuous slow, generalized IMPRESSION: This study is suggestive of severe diffuse encephalopathy, nonspecific etiology but could be secondary to sedation. No seizures or epileptiform discharges were seen throughout the recording. Priyanka O Yadav   VAS [place] LOWER EXTREMITY VENOUS (DVT)  Result Date: 09/20/2019  Lower Venous DVT Study Indications: Covid-19.  Limitations: Ventilation. Comparison Study: No prior study on file Performing Technologist: Sharion Dove RVS  Examination Guidelines: A complete evaluation includes B-mode imaging, spectral Doppler, color Doppler, and  power Doppler as needed of all accessible portions of each vessel. Bilateral testing is considered an integral part of a complete examination. Limited examinations for reoccurring indications may be performed as noted. The reflux portion of the exam is performed with the patient in reverse Trendelenburg.  +---------+---------------+---------+-----------+----------+--------------+ RIGHT    CompressibilityPhasicitySpontaneityPropertiesThrombus Aging +---------+---------------+---------+-----------+----------+--------------+ CFV      Full           Yes      No                                  +---------+---------------+---------+-----------+----------+--------------+ SFJ      Full                                                        +---------+---------------+---------+-----------+----------+--------------+ FV Prox  Full                                                        +---------+---------------+---------+-----------+----------+--------------+ FV Mid   Full                                                        +---------+---------------+---------+-----------+----------+--------------+ FV DistalFull                                                        +---------+---------------+---------+-----------+----------+--------------+  PFV      Full                                                        +---------+---------------+---------+-----------+----------+--------------+ POP      Full           Yes      Yes                                 +---------+---------------+---------+-----------+----------+--------------+ PTV      Full                                                        +---------+---------------+---------+-----------+----------+--------------+ PERO     Full                                                        +---------+---------------+---------+-----------+----------+--------------+    +---------+---------------+---------+-----------+----------+--------------+ LEFT     CompressibilityPhasicitySpontaneityPropertiesThrombus Aging +---------+---------------+---------+-----------+----------+--------------+ CFV      Full           No       No                                  +---------+---------------+---------+-----------+----------+--------------+ SFJ      Full                                                        +---------+---------------+---------+-----------+----------+--------------+ FV Prox  Full                                                        +---------+---------------+---------+-----------+----------+--------------+ FV Mid   Full                                                        +---------+---------------+---------+-----------+----------+--------------+ FV DistalFull                                                        +---------+---------------+---------+-----------+----------+--------------+ PFV      Full                                                        +---------+---------------+---------+-----------+----------+--------------+  POP      Full           Yes      Yes                                 +---------+---------------+---------+-----------+----------+--------------+ PTV      Full                                                        +---------+---------------+---------+-----------+----------+--------------+ PERO     Full                                                        +---------+---------------+---------+-----------+----------+--------------+     Summary: BILATERAL: - No evidence of deep vein thrombosis seen in the lower extremities, bilaterally. - No evidence of superficial venous thrombosis in the lower extremities, bilaterally.   *See table(s) above for measurements and observations. Electronically signed by Servando Snare MD on 09/20/2019 at 2:47:00 PM.    Final    ECHOCARDIOGRAM  LIMITED  Result Date: 09/20/2019   ECHOCARDIOGRAM LIMITED REPORT   Patient Name:   APOLINAR GUIRGUIS Date of Exam: 09/19/2019 Medical Rec #:  [id number]     Height:       68.0 in Accession #:    [id number]    Weight:       177.7 lb Date of Birth:  1941/06/05     BSA:          1.94 m Patient Age:    46 years      BP:           103/78 mmHg Patient Gender: M             HR:           79 bpm. Exam Location:  Inpatient  Procedure: Limited Echo, Limited Color Doppler and Color Doppler Indications:    Acute Myocardial Infarction 410  History:        Patient has prior history of Echocardiogram examinations, most                 recent 04/05/2019. Mitral Valve Repair 08/03/2011. [place], COPD,                 Covid-19 Positive  Sonographer:    Mikki Santee RDCS (AE) Referring Phys: [id number] Bent  1. Global right ventricle has severely reduced systolic function.The right ventricular size is severely enlarged. Right ventricular wall thickness was not assessed.  2. Left ventricular ejection fraction, by visual estimation, is 35%. The left ventricle has severely decreased function.  3. Left ventricular diastolic parameters are indeterminate.  4. Right atrial size was moderately dilated.  5. The mitral valve has been repaired, 08/03/2011. Annuloplasty ring appears normal, no evidence of dehiscence. Trivial mitral valve regurgitation. No evidence of mitral stenosis. Mean diastolic gradient 3 mmHg at HR 78 bpm.  6. Tricuspid valve regurgitation moderate.  7. Mildly elevated pulmonary artery systolic pressure.  8. The inferior vena cava is dilated in size with <50% respiratory variability, suggesting right atrial pressure of 15 mmHg. FINDINGS  Left Ventricle: Left ventricular ejection fraction, by visual estimation, is 35%. The left ventricle has severely decreased function. Left ventricular diastolic parameters are indeterminate. Right Ventricle: The right ventricular size is severely enlarged. Right vetricular  wall thickness was not assessed. Global RV systolic function is has severely reduced systolic function. The tricuspid regurgitant velocity is 2.28 m/s, and with an assumed  right atrial pressure of 15 mmHg, the estimated right ventricular systolic pressure is mildly elevated at 35.7 mmHg. Left Atrium: Left atrial size was normal in size. Right Atrium: Right atrial size was moderately dilated. Right atrial pressure is estimated at 15 mmHg. Mitral Valve: The mitral valve has been repaired/replaced. No evidence of mitral valve stenosis by observation. MV Area by PHT, 2.09 cm. MV PHT, 105.27 msec. MV peak gradient, 6.4 mmHg. Trivial mitral valve regurgitation. Tricuspid Valve: Tricuspid valve regurgitation moderate. Aortic Valve: The aortic valve is tricuspid. . There is moderate thickening of the aortic valve. There is moderate thickening of the aortic valve. Aorta: The aortic root is normal in size and structure, the aortic arch was not well visualized and the ascending aorta was not well visualized. Venous: The inferior vena cava is dilated in size with less than 50% respiratory variability, suggesting right atrial pressure of 15 mmHg. Shunts: The atrial septum is grossly normal.  LEFT VENTRICLE          Normals PLAX 2D LVIDd:         4.00 cm  3.6 cm   Diastology                 Normals LVIDs:         3.20 cm  1.7 cm   LV e' lateral:   6.00 cm/s 6.42 cm/s LV PW:         1.00 cm  1.4 cm   LV E/e' lateral: 16.8      15.4 LV IVS:        0.70 cm  1.3 cm   LV e' medial:    3.83 cm/s 6.96 cm/s LVOT diam:     2.40 cm  2.0 cm   LV E/e' medial:  26.4      6.96 LV SV:         29 ml    79 ml LV SV Index:   14.65    45 ml/m2 LVOT Area:     4.52 cm 3.14 cm2  RIGHT VENTRICLE RV S prime:     9.30 cm/s TAPSE (M-mode): 1.9 cm LEFT ATRIUM           Index       RIGHT ATRIUM           Index LA diam:      3.40 cm 1.75 cm/m  RA Area:     29.80 cm LA Vol (A4C): 42.1 ml 21.66 ml/m RA Volume:   127.00 ml 65.34 ml/m  AORTIC VALVE              Normals LVOT Vmax:   58.50 cm/s LVOT Vmean:  37.500 cm/s 75 cm/s LVOT VTI:    0.113 m     25.3 cm  AORTA                 Normals Ao Root diam: 3.70 cm 31 mm MITRAL VALVE               Normals   TRICUSPID VALVE             Normals  MV Area (PHT): 2.09 cm              TR Peak grad:   20.7 mmHg MV Peak grad:  6.4 mmHg    4 mmHg    TR Vmax:        236.00 cm/s 288 cm/s MV Mean grad:  3.0 mmHg MV Vmax:       1.26 m/s              SHUNTS MV Vmean:      87.5 cm/s             Systemic VTI:  0.11 m MV VTI:        0.39 m                Systemic Diam: 2.40 cm MV PHT:        105.27 msec 55 ms MV Decel Time: 363 msec    187 ms MV E velocity: 101.00 cm/s 103 cm/s MV A velocity: 56.30 cm/s  70.3 cm/s MV E/A ratio:  1.79        1.5  Cherlynn Kaiser MD Electronically signed by Cherlynn Kaiser MD Signature Date/Time: 09/20/2019/8:24:22 AMThe mitral valve has been repaired/replaced.    Final     PHYSICAL EXAM Frail elderly Caucasian male who is intubated.  Not in distress. . Afebrile. Head is nontraumatic. Neck is supple without bruit.    Cardiac exam no murmur or gallop. Lungs are clear to auscultation. Distal pulses are well felt. Neurological Exam :  Patient is off sedation from this morning.  Is intubated.  Eyes are closed.  He does not respond to verbal stimuli.  Eyes are in primary position pupils 3 mm sluggishly reactive.  Corneal reflexes are present.  Doll's eye movements are sluggish.  Fundi were not visualized.  He does not follow any commands.  There are no spontaneous extremity movements but he does semilocalize to pain more in the left upper and lower extremity than on the right.  Deep tendon reflexes are symmetric.  Plantars are both equivocal.  ASSESSMENT/PLAN Mr. JERELL VIVERITO is a 79 y.o. male with history of COPD, HLD, MVP s/p repair, TIA in 2004, hypothyroidism, depression/anxiety who was was admitted at [facility] for [place] 08/27/2019 thru 09/10/2019, fell while trying to get OOB at home and found altered with  progressive confusion, not speaking by family. Concern for aphasia and R sided weakness in the ED.   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 No hemorrhage. Small vessel disease. Atrophy. Sinus dz.   CTA head & neck no ELVO.   CT perfusion normal  MRI  Multiple small B anterior and posterior circulation infarcts. Old R frontal lobe infarct. Mild small vessel disease.  2D Echo RV - severely reduced systolic fxn, severe increase in size. EF 35%. MV repair appears intact.  LE dopplers neg  TEE pending   EEG continuous slowing  LDL pending   HgbA1c 6.8  Heparin 5000 units sq tid for VTE prophylaxis  No antithrombotic prior to admission, now on No antithrombotic. 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 Permissive hypertension (OK if < 220/120) but gradually normalize in 5-7 days . Long-term BP goal normotensive  Hyperlipidemia  Home meds:   No statin d/t intolerance  LDL pending, goal < 70  May Consider [facility] at d/c   Other Stroke Risk Factors  Advanced age  Former Cigarette smoker, quit 22 yrs ago  Hx stroke/TIA  10/2002 - TIA w/ transient R HH   Family hx stroke (father)  Coronary artery  disease  MV stenosis s/p repair  Other Active Problems  AKI / Ovid  Hypothyroidism    Anxiety/pain  Hospital day # 2 I have personally obtained history,examined this patient, reviewed notes, independently viewed imaging studies, participated in medical decision making and plan of care.ROS completed by me personally and pertinent positives fully documented  I have made any additions or clarifications directly to the above note.  Patient presented with altered mental status and encephalopathy of unclear etiology but clearly has multiple small cortical and subcortical infarcts related to Covid hypercoagulability, vasculitis or cardiogenic emboli from myocarditis or depressed cardiac function.  Interestingly he also has aseptic meningitis which appears to be nonbacterial and the time course of his Covid infection is not in favor of Covid meningoencephalitis as he tested positive for weeks ago.  His prognosis appears quite poor given multisystem involvement and poor mental status.  Recommend continue supportive care for the next few days to see if there is any meaningful improvement.  Recommend transesophageal echocardiogram to look for vegetations, clots and cardiac source of embolism.  Check lower extremity venous Dopplers for DVT.  Need to discuss with family goals of care.  Discussed with Dr. Tamala Julian critical care medicine. This patient is critically ill and at significant risk of neurological worsening, death and care requires constant monitoring of vital signs, hemodynamics,respiratory and cardiac monitoring, extensive review of multiple databases, frequent neurological assessment, discussion with family, other specialists and medical decision making of high complexity.I have made any additions or clarifications directly to the above note.This critical care time does not reflect procedure time, or teaching time or supervisory time of PA/NP/Med Resident etc but could involve care discussion time.  I  spent 30 minutes of neurocritical care time  in the care of  this patient.    Antony Contras, MD Medical Director [place] Pager: [phone] 09/20/2019 5:55 PM *  To contact Stroke Continuity provider, please refer to [email or web address] After hours, contact General Neurology

## 2019-09-20 NOTE — Progress Notes (Signed)
[facility] Progress Note Patient Name: Chris Lewis DOB: [birth year] MRN: [id number]   Date of Service  09/20/2019  HPI/Events of Note  Hyperglycemia - Blood glucose = 205. Patient is on Hydrocortisone.   eICU Interventions  Will order: 1. Q 4 hour moderate Novolog SSI.     Intervention Category Major Interventions: Hyperglycemia - active titration of insulin therapy  Nyelle Wolfson [place] 09/20/2019, 12:22 AM

## 2019-09-20 NOTE — Progress Notes (Signed)
[facility] Progress Note Patient Name: Chris Lewis DOB: [birth year] MRN: [id number]   Date of Service  09/20/2019  HPI/Events of Note  Hypomagnesia - Mg++ = 1.9 and Creatinine = 1.83.   eICU Interventions  Will order: 1. Replace Mg++.      Intervention Category Major Interventions: Electrolyte abnormality - evaluation and management  [name] 09/20/2019, 3:44 AM

## 2019-09-20 NOTE — Progress Notes (Signed)
Called [facility] and spoke to nurse concerning blood glucose and heart rhythm. Nurse stated she would get with MD to add sliding scale and possible labs, continue to monitor

## 2019-09-20 NOTE — Progress Notes (Signed)
Spoke to [employer] on camera  in pt. Room  Concerning pt. Heart rate and rhythm, awaiting Dr. Oletta Darter to review

## 2019-09-20 NOTE — Progress Notes (Signed)
  Progress Note   Date: 09/20/2019  Patient Name: Chris Lewis        MRN#: [id number]   Clarification of diagnosis:   Acute respiratory failure   - with hypoxia

## 2019-09-20 NOTE — Progress Notes (Addendum)
NAME:  Chris Lewis, MRN:  [id number], DOB:  1940/12/03, LOS: 2 ADMISSION DATE:  10/10/2019, CONSULTATION DATE:  09/17/2019 REFERRING MD:  Alvino Chapel, CHIEF COMPLAINT:  AMS, fall  Brief History   79 year old man with a history of COPD (emphysema seen on CT scan), Achalasia, anxiety/depression, HLD, hypothyroidism, RBBB, MR s/p MV repair, Recently hospitalized at [facility] for covid PNA (1/12-1/25) now here with AMS. Per wife he fell overnight while trying to get out of bed on his own.  AM of admit was found altered and not speaking by his family.    Past Medical History  Achalasia, anxiety/depression, HLD, hypothyroidism, RBBB, MR s/p MV repair, Recently hospitalized at [facility] for covid PNA (1/12-1/25)   Significant Hospital Events   2/02 Admit   Consults:  PCCM  Neurology   Procedures:  ETT 2/2 >>   Significant Diagnostic Tests:  CT Head 2/2 >> motion degraded study, age-indeterminate infarct involving the R frontal lobe CTA Head / Neck / Perfusion 2/2 >> no emergent large vessel occlusion, normal cerebral CT perfusion  Micro Data:  MRSA PCR 2/3 >> positive  UC 2/2 >> MRSA 100k CSF 2/4 >>  BCx2 2/2 >>   Antimicrobials:  Ceftriaxone x 1 2/2 Cefepime 2/2 >> Vancomycin 2/3 >> Ampicillin 2/3 >>  Flagyl 2/3 >>  Interim history/subjective:  PEEP 5 / FiO2 40% Glucose 200-260 Afebrile  UOP 1.1L, +1.5L in 24 hours Remains on vasopressin, levophed gtt's On fentanyl, versed   Objective   Blood pressure 93/68, pulse 92, temperature 99.5 F (37.5 C), temperature source Axillary, resp. rate (!) 21, height 5\' 8"  (1.727 m), weight 78.6 kg, SpO2 96 %.    Vent Mode: PRVC FiO2 (%):  [40 %-50 %] 40 % Set Rate:  [20 bmp] 20 bmp Vt Set:  [540 mL-600 mL] 540 mL PEEP:  [5 cmH20] 5 cmH20 Plateau Pressure:  [16 cmH20-18 cmH20] 16 cmH20   Intake/Output Summary (Last 24 hours) at 09/20/2019 1211 Last data filed at 09/20/2019 0800 Gross per 24 hour  Intake 3153.72 ml  Output 1000 ml  Net 2153.72 ml     Filed Weights   09/26/2019 1640 09/19/19 0322 09/20/19 0434  Weight: 80.2 kg 80.6 kg 78.6 kg    Examination: General: elderly male lying in bed on vent, critically ill appearing  HEENT: MM pink/moist, ETT Neuro: sedate  CV: s1s2 RRR, no m/r/g, no significant response to straight leg raise (BP) PULM:  Non-labored, vent assisted breath sounds GI: soft, bsx4 active  Extremities: warm/dry, no edema  Skin: no rashes or lesions  Resolved Hospital Problem list     Assessment & Plan:   Acute Metabolic Encephalopathy: CT head negative.  Suspect related to sepsis with MRSA in urine, though AMS seems out of proportion to lab and vital sign abnormalities.   Consider meningitis vs encephalitis given fever.  -appreciate Neurology input  -continue empiric abx -TEE for endocarditis given staph in urine  -PT efforts  -delirium prevention measures   Acute Hypoxemic Respiratory Failure: hx of COVID, COPD, severe emphysema on CT imaging from prior admission.  Recent admission for covid PNA 1/12-1/25, discharged on 2L O2. Urine strep negative.  -low Vt ventilation PRVC 4-8cc/kg -wean PEEP / FiO2 for sats > 90% -VAP prevention measures  -follow intermittent CXR -abx as above -hx of COVID, 23 days out, doubt current symptoms relate to COVID   Shock  Suspect mixed shock with biventricular failure and MRSA UTI. MRSA UTI  LVEF 35%, IVC not  collapsing on [place]  -continue broad spectrum abx -vasopressors for MAP >65 -hold further fluids, no response with straight leg raise  -continue stress dose steroids  NSTEMI Acute BiVentricular Failure / COVID Myocarditis  RBBB old.  Trop elevated but trending down.  -tele monitoring  -Cardiology consulted -assess SvO2 -vasopressors for MAP >65 -consider TEE to r/o vegetation with MRSA UTI  Thrombocytopenia -monitor, no indication for transfusion at this time  AKI   NGMA  -Trend BMP / urinary output -Replace electrolytes as indicated -Avoid  nephrotoxic agents, ensure adequate renal perfusion  Hypothyroidism  TSH 2.7 on admit  -continue synthroid   Hyperglycemia  -SSI, moderate scale  -add lantus 5 units QD   Anxiety/pain  -continue lexapro -hold home klonopin while on versed gtt -hold tramadol   Best practice:  Diet: npo Pain/Anxiety/Delirium protocol (if indicated): PAD protocol  VAP protocol (if indicated): in place DVT prophylaxis: heparin GI prophylaxis: protonix  Glucose control: SSI Mobility: BR Code Status: DNR, shocks ok, intubation ok.    Family Communication: Wife updated per Dr. Tamala Julian 2/4 Disposition: ICU  Labs   CBC: Recent Labs  Lab 09/28/2019 1545 09/22/2019 1824 09/19/19 0028 09/19/19 0036 09/19/19 0451 09/19/19 0624 09/19/19 0800 09/20/19 0922  WBC 13.5*  --   --  19.3*  --  26.6*  --  19.5*  NEUTROABS 12.5*  --   --   --   --   --   --   --   HGB 13.8   < > 13.3 14.1 12.6* 12.4*  --  12.2*  HCT 41.9   < > 39.0 43.6 37.0* 38.7*  --  37.3*  MCV 92.5  --   --  95.4  --  94.4  --  94.0  PLT 56*  --   --  69*  --  120* 120* PLATELET CLUMPS NOTED ON SMEAR, UNABLE TO ESTIMATE   < > = values in this interval not displayed.    Basic Metabolic Panel: Recent Labs  Lab 09/26/2019 1545 10/13/2019 1545 10/03/2019 1824 09/19/19 0028 09/19/19 0036 09/19/19 0451 09/19/19 [id number] 09/19/19 1844 09/20/19 0129 09/20/19 0922  NA 132*   < > 134* 135  --  135 137  --   --  140  K 4.5   < > 4.1 4.0  --  4.0 4.7  --   --  3.7  CL 98  --   --   --   --   --  103  --   --  110  CO2 19*  --   --   --   --   --  20*  --   --  20*  GLUCOSE 121*  --   --   --   --   --  172*  --   --  258*  BUN 25*  --   --   --   --   --  30*  --   --  48*  CREATININE 1.61*  --   --   --  1.49*  --  1.83*  --   --  1.60*  CALCIUM 7.9*  --   --   --   --   --  7.1*  --   --  7.2*  MG  --   --   --   --   --   --   --  1.9 1.9  --   PHOS  --   --   --   --   --   --   --  4.6 3.7  --    < > = values in this interval not  displayed.   GFR: Estimated Creatinine Clearance: 35.6 mL/min (A) (by C-G formula based on SCr of 1.6 mg/dL (H)). Recent Labs  Lab 09/27/2019 1545 09/22/2019 1831 09/19/19 0036 09/19/19 0624 09/20/19 0922  PROCALCITON  --  3.36  --   --   --   WBC 13.5*  --  19.3* 26.6* 19.5*  LATICACIDVEN 4.6* 3.4*  --  2.9*  --     Liver Function Tests: Recent Labs  Lab 09/23/2019 1545 09/19/19 0624  AST 55* 54*  ALT 37 39  ALKPHOS 87 120  BILITOT 2.0* 0.8  PROT 5.5* 4.8*  ALBUMIN 2.7* 2.0*   No results for input(s): LIPASE, AMYLASE in the last 168 hours. No results for input(s): AMMONIA in the last 168 hours.  ABG    Component Value Date/Time   PHART 7.314 (L) 09/19/2019 0451   PCO2ART 39.9 09/19/2019 0451   PO2ART 102.0 09/19/2019 0451   HCO3 19.8 (L) 09/19/2019 0451   TCO2 21 (L) 09/19/2019 0451   ACIDBASEDEF 5.0 (H) 09/19/2019 0451   O2SAT 96.0 09/19/2019 0451     Coagulation Profile: Recent Labs  Lab 09/22/2019 1831 09/19/19 0800  INR 1.3* 1.3*    Cardiac Enzymes: No results for input(s): CKTOTAL, CKMB, CKMBINDEX, TROPONINI in the last 168 hours.  HbA1C: Hgb A1c MFr Bld  Date/Time Value Ref Range Status  09/20/2019 01:29 AM 6.8 (H) 4.8 - 5.6 % Final    Comment:    (NOTE) Pre diabetes:          5.7%-6.4% Diabetes:              >6.4% Glycemic control for   <7.0% adults with diabetes   08/29/2018 12:09 PM 5.9 4.6 - 6.5 % Final    Comment:    Glycemic Control Guidelines for People with Diabetes:Non Diabetic:  <6%Goal of Therapy: <7%Additional Action Suggested:  >8%     CBG: Recent Labs  Lab 09/19/19 1942 09/19/19 2327 09/20/19 0326 09/20/19 0728 09/20/19 1141  GLUCAP 153* 205* 227* 230* 204*    Critical care time: 79 minutes   Noe Gens, MSN, NP-C [facility] 09/20/2019, 1:43 PM   Please see [email or web address] for pager details.

## 2019-09-20 NOTE — Progress Notes (Signed)
Patient placed back on full support. MD placed patient on wean earlier in the day. Patients Ve over 20 and had some belly breathing.

## 2019-09-20 NOTE — Progress Notes (Signed)
[place] Progress Note Patient Name: Chris Lewis DOB: 1941-03-25 MRN: [id number]   Date of Service  09/20/2019  HPI/Events of Note  AFIB/AFLUTTER - Ventricular rate = 108 to 126. Allergy to iodinated IV dye, therefore, can't give Amiodarone and on Norepinephrine IV infusion, therefore. Can't give Ca++ Channel Blocker. K+ = 4.7 and Mg++ being replaced.   eICU Interventions  Will order: 1. Metoprolol 2.5 mg IV now.      Intervention Category Major Interventions: Arrhythmia - evaluation and management  Jadira Nierman Eugene 09/20/2019, 4:27 AM

## 2019-09-20 NOTE — Consult Note (Signed)
Cardiology Consultation:   Patient ID: Chris Lewis MRN: [id number]; DOB: [birth year]  Admit date: 10/13/2019 Date of Consult: 09/20/2019  Primary Care Provider: Biagio Borg, MD Primary Cardiologist: No primary care provider on file.  Primary Electrophysiologist:  None    Patient Profile:   Chris Lewis is a 79 y.o. male with a hx of COPD, achalasia, HLD, RBBB, MR s/p MV repair, recent hospitalization for COVID-19 pneumonia (1/12-1/25) who is being seen today for the evaluation of NSTEMI, acute BiVentricular failure at the request of Dr. Tamala Julian.  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 Last seen in 03/29/19 at which time his echo showed normal LV function with mild diastolic dysfunction, moderated reduced RV function and mildly thickened MV with stable valve repair.   Patient was recently hospitalized at [facility] from 1/12-1/25 with COVID pneumonia. Per chart review he recovered well and was discharged on 3L Oxygen supplementation and slow steroid taper.   On day of admission, he was brought in for AMS. Wife says he fell overnight while trying to get out of bed. Family found him the next morning altered and not speaking. In the ED he was febrile, lethargic, non-verbal and in respiratory distress. He was intubated for airway protection and admitted to ICU for further management. Due to AMS, fever and stiff neck on exam, neurology recommended broad-spectrum antibiotics.   Since admission, he has remained poorly responsive without sedation, continues to require pressor support. Echo done yesterday revealed acute Bi-Ventricular failure. CT head showed multiple small strokes. LP showed abnormal CSF studies concerning for inflammatory vs viral meningitis. His urine culture was positive for MRSA.   Cardiology was consulted to evaluate for possible TEE to look for IE or LV clot.     Heart Pathway Score:     Past Medical History:    Diagnosis Date  . Achalasia   . Anxiety    causes shortness  . Arthritis   . COPD (chronic obstructive pulmonary disease) ([place])    with chronic SOB  . Depression   . Dilated aortic root ([place])    69m by echo 03/2019  . FH: cholecystectomy   . Gallstones   . GERD (gastroesophageal reflux disease)   . Hiatal hernia   . History of alcoholism ([place]   . Hypercholesterolemia   . Hypothyroidism 01/12/2016  . Lesion of right lung 06/04/2013  . MVP (mitral valve prolapse)    with severe MR s/p MV repair  . Osteopenia   . Pneumonia 06/04/2013   aspirated food 14  . RBBB   . S/P right inguinal herniorrhaphy   . Seasonal allergic rhinitis   . Stroke ([place] 10/2002  . Transient ischemic attack     Past Surgical History:  Procedure Laterality Date  . BACK SURGERY     x 3  . BOTOX INJECTION  06/27/2012   Procedure: BOTOX INJECTION;  Surgeon: MGarlan Fair MD;  Location: [facility];  Service: Endoscopy;  Laterality: N/A;  . CHOLECYSTECTOMY    . ESOPHAGOGASTRODUODENOSCOPY  01/13/2012   Procedure: ESOPHAGOGASTRODUODENOSCOPY (EGD);  Surgeon: MGarlan Fair MD;  [facility];  Service: Endoscopy;  Laterality: N/A;  botox /katrina  . ESOPHAGOGASTRODUODENOSCOPY  06/27/2012   Procedure: ESOPHAGOGASTRODUODENOSCOPY (EGD);  Surgeon: MGarlan Fair MD;  [facility];  Service: Endoscopy;  Laterality: N/A;  . ESOPHAGOGASTRODUODENOSCOPY (EGD) WITH ESOPHAGEAL DILATION N/A 11/22/2012   Procedure: ESOPHAGOGASTRODUODENOSCOPY (EGD) WITH BOTOX  INJECTION;  Surgeon: Garlan Fair, MD;  [facility];  Service: Endoscopy;  Laterality: N/A;  . Esophagogastroduodenoscopy and Savary esophageal dilation.     x9  . ESOPHAGOSCOPY W/ BOTOX INJECTION     x9 beginning 2008, most recent Sept. 2012  . eyebrow surgery  2012  . FOOT SURGERY Left    screw -fx  . LAPAROSCOPIC HELLER MYOTOMY  July 2014   [facility]  . MITRAL VALVE REPAIR  08/03/2011   Procedure: MINIMALLY INVASIVE  MITRAL VALVE REPAIR (MVR);  Surgeon: Rexene Alberts, MD;  Location: [facility];  Service: Open Heart Surgery;  Laterality: Right;  minimally invasive MVR  . rt inguinal herniorrhaphy  1969/1997  . TEE WITHOUT CARDIOVERSION  07/06/2011   Procedure: TRANSESOPHAGEAL ECHOCARDIOGRAM (TEE);  Surgeon: Sueanne Margarita, MD;  Location: [facility];  Service: Cardiovascular;  Laterality: N/A;  . VIDEO BRONCHOSCOPY WITH ENDOBRONCHIAL NAVIGATION N/A 04/03/2014   Procedure: VIDEO BRONCHOSCOPY WITH ENDOBRONCHIAL NAVIGATION;  Surgeon: Collene Gobble, MD;  Location: [facility];  Service: Thoracic;  Laterality: N/A;     Home Medications:  Prior to Admission medications   Medication Sig Start Date End Date Taking? Authorizing Provider  acetaminophen (TYLENOL) 500 MG tablet Take 1,000 mg by mouth every 6 (six) hours as needed for mild pain, fever or headache.   Yes [provider]  albuterol (VENTOLIN HFA) 108 (90 Base) MCG/ACT inhaler INHALE 2 PUFFS BY MOUTH EVERY 4 HOURS AS NEEDED FOR WHEEZE Patient taking differently: Inhale 2 puffs into the lungs every 4 (four) hours as needed for wheezing.  05/22/19  Yes Parrett, Tammy S, NP  azelastine (ASTELIN) 0.1 % nasal spray Place 2 sprays into both nostrils at bedtime. Use in each nostril as directed Patient taking differently: Place 2 sprays into both nostrils at bedtime.  01/17/18  Yes Parrett, Tammy S, NP  calcium carbonate (TUMS EX) 750 MG chewable tablet Chew 1 tablet by mouth as needed (for gas and reflux).    Yes [provider]  cetirizine (ZYRTEC) 10 MG tablet Take 1 tablet (10 mg total) by mouth daily as needed for allergies. Reported on 10/27/2015 01/06/17  Yes Biagio Borg, MD  Cholecalciferol (VITAMIN D3) 1000 UNITS CAPS Take 1,000 Units by mouth daily. Reported on 09/16/2015   Yes [provider]  clonazePAM (KLONOPIN) 0.5 MG tablet take 1 tablet by mouth twice a day if needed Patient taking differently: Take 0.5 mg by mouth 2 (two) times daily  as needed for anxiety.  05/28/19  Yes Biagio Borg, MD  diclofenac sodium (VOLTAREN) 1 % GEL Apply 2 g topically 4 (four) times daily. Patient taking differently: Apply 2 g topically 4 (four) times daily as needed (joint pain).  08/29/18  Yes Biagio Borg, MD  escitalopram (LEXAPRO) 10 MG tablet TAKE 1 TABLET BY MOUTH EVERY DAY Patient taking differently: Take 10 mg by mouth daily.  11/27/18  Yes Biagio Borg, MD  hydrocortisone (CORTEF) 20 MG tablet Take 2 tablets twice daily for 4 days, followed by 1 tablet twice daily for 4 days, followed by 1 tablet once daily for 4 days, then STOP. 09/10/19  Yes Bonnielee Haff, MD  levothyroxine (SYNTHROID, LEVOTHROID) 25 MCG tablet TAKE 1 TABLET BY MOUTH EVERY DAY BEFORE BREAKFAST Patient taking differently: Take 25 mcg by mouth daily before breakfast.  11/27/18  Yes Biagio Borg, MD  metroNIDAZOLE (METROGEL) 0.75 % gel Apply 1 application topically 2 (two) times daily.   Yes [provider]  pantoprazole (PROTONIX) 40 MG tablet Take 40 mg by mouth daily. 05/11/19  Yes [provider]  tiotropium (SPIRIVA HANDIHALER) 18 MCG inhalation capsule PLACE 1 CAPSULE INTO INHALER AND INHALE DAILY Patient taking differently: Place 18 mcg into inhaler and inhale daily.  08/03/19  Yes Parrett, Tammy S, NP  traMADol (ULTRAM) 50 MG tablet Take 1 tablet (50 mg total) by mouth every 12 (twelve) hours as needed. Patient taking differently: Take 50 mg by mouth every 12 (twelve) hours as needed for moderate pain.  05/28/19  Yes Biagio Borg, MD    Inpatient Medications: Scheduled Meds: . chlorhexidine gluconate (MEDLINE KIT)  15 mL Mouth Rinse BID  . Chlorhexidine Gluconate Cloth  6 each Topical Q0600  . feeding supplement (PRO-STAT SUGAR FREE 64)  30 mL Per Tube BID  . free water  200 mL Per Tube Q6H  . heparin  5,000 Units Subcutaneous Q8H  . hydrocortisone sod succinate (SOLU-CORTEF) inj  50 mg Intravenous Q6H  . insulin aspart  0-15 Units  Subcutaneous Q4H  . insulin glargine  5 Units Subcutaneous Daily  . levothyroxine  25 mcg Per Tube Q0600  . mouth rinse  15 mL Mouth Rinse 10 times per day  . mupirocin ointment  1 application Nasal BID  . pantoprazole sodium  40 mg Per Tube Daily   Continuous Infusions: . ampicillin (OMNIPEN) IV 2 g (09/20/19 1350)  . ceFEPime (MAXIPIME) IV 2 g (09/20/19 0931)  . feeding supplement (VITAL 1.5 CAL) 1,000 mL (09/19/19 1800)  . fentaNYL infusion INTRAVENOUS Stopped (09/20/19 0756)  . metronidazole 500 mg (09/20/19 1138)  . midazolam Stopped (09/20/19 0756)  . norepinephrine (LEVOPHED) Adult infusion 20 mcg/min (09/20/19 1340)  . vancomycin Stopped (09/19/19 2306)  . vasopressin (PITRESSIN) infusion - *FOR SHOCK* 0.03 Units/min (09/20/19 0800)   PRN Meds: fentaNYL, fentaNYL (SUBLIMAZE) injection, midazolam  Allergies:    Allergies  Allergen Reactions  . Duac [Clindamycin Phos-Benzoyl Perox] Rash  . Contrast Media [Iodinated Diagnostic Agents] Rash    Heavy rash and itching 20 yrs ago. Needs full premeds and does well with this.  . Atorvastatin Other (See Comments)    myalgias  . Propoxyphene N-Acetaminophen Itching    rash  . Rosuvastatin Other (See Comments)    myalgias  . Metrizamide Rash    radiopaque contrast agent. Heavy rash and itching 20 yrs ago. Needs full premeds and does well with this.  Orie Fisherman Vegetable Orange [Psyllium] Other (See Comments)    Heartburn   . Other Rash    High acid food--tomatoes, orange juice  . Tomato Other (See Comments)    Heartburn     Social History:   Social History   Socioeconomic History  . Marital status: Married    Spouse name: Not on file  . Number of children: 2  . Years of education: Not on file  . Highest education level: Not on file  Occupational History    Employer: RETIRED  Tobacco Use  . Smoking status: Former Smoker    Packs/day: 1.50    Years: 38.00    Pack years: 57.00    Types: Cigarettes    Quit date:  08/29/1997    Years since quitting: 22.0  . Smokeless tobacco: Never Used  Substance and Sexual Activity  . Alcohol use: No  . Drug use: No  . Sexual activity: Not on file  Other Topics Concern  . Not on file  Social History Narrative  . Not on file  Social Determinants of Health   Financial Resource Strain:   . Difficulty of Paying Living Expenses: Not on file  Food Insecurity:   . Worried About Charity fundraiser in the Last Year: Not on file  . Ran Out of Food in the Last Year: Not on file  Transportation Needs:   . Lack of Transportation (Medical): Not on file  . Lack of Transportation (Non-Medical): Not on file  Physical Activity:   . Days of Exercise per Week: Not on file  . Minutes of Exercise per Session: Not on file  Stress:   . Feeling of Stress : Not on file  Social Connections:   . Frequency of Communication with Friends and Family: Not on file  . Frequency of Social Gatherings with Friends and Family: Not on file  . Attends Religious Services: Not on file  . Active Member of Clubs or Organizations: Not on file  . Attends Archivist Meetings: Not on file  . Marital Status: Not on file  Intimate Partner Violence:   . Fear of Current or Ex-Partner: Not on file  . Emotionally Abused: Not on file  . Physically Abused: Not on file  . Sexually Abused: Not on file    Family History:    Family History  Problem Relation Age of Onset  . Stroke Father 55       cva  . Scleroderma Mother 52  . Lung cancer Sister   . Cancer Sister   . Heart disease Sister   . Esophageal cancer Other        nephew  . Prostate cancer Paternal Uncle   . Anesthesia problems Neg Hx   . Colon cancer Neg Hx   . Liver cancer Neg Hx      ROS:   Review of Systems  Unable to perform ROS: Intubated   All other ROS reviewed and negative.     Physical Exam/Data:   Vitals:   09/20/19 0745 09/20/19 0749 09/20/19 0800 09/20/19 1142  BP: 93/68     Pulse: 90 92    Resp: 18  (!) 21    Temp:   100 F (37.8 C) 99.5 F (37.5 C)  TempSrc:   Oral Axillary  SpO2: 92% 96%    Weight:      Height:        Intake/Output Summary (Last 24 hours) at 09/20/2019 1358 Last data filed at 09/20/2019 0800 Gross per 24 hour  Intake 3139.37 ml  Output 1000 ml  Net 2139.37 ml   Last 3 Weights 09/20/2019 09/19/2019 10/07/2019  Weight (lbs) 173 lb 4.5 oz 177 lb 11.1 oz 176 lb 12.9 oz  Weight (kg) 78.6 kg 80.6 kg 80.2 kg     Body mass index is 26.35 kg/m.   Exam was not performed to limit PPE. Please see attending attestation.   EKG:  The EKG was personally reviewed and demonstrates: sinus tach with RBBB that is unchanged   Relevant CV Studies: See echo report below   Laboratory Data:  High Sensitivity Troponin:   Recent Labs  Lab 08/27/19 1910 08/27/19 2128 10/04/2019 1831 09/19/19 0239 09/19/19 0537  TROPONINIHS 18* 17 1,534* 1,285* 1,476*     Chemistry Recent Labs  Lab 10/11/2019 1545 10/05/2019 1824 09/19/19 0028 09/19/19 0036 09/19/19 0451 09/19/19 0624 09/20/19 0922  NA 132*   < >   < >  --  135 137 140  K 4.5   < >   < >  --  4.0 4.7 3.7  CL 98  --   --   --   --  103 110  CO2 19*  --   --   --   --  20* 20*  GLUCOSE 121*  --   --   --   --  172* 258*  BUN 25*  --   --   --   --  30* 48*  CREATININE 1.61*   < >  --  1.49*  --  1.83* 1.60*  CALCIUM 7.9*  --   --   --   --  7.1* 7.2*  GFRNONAA 40*   < >  --  44*  --  34* 40*  GFRAA 46*   < >  --  51*  --  40* 46*  ANIONGAP 15  --   --   --   --  14 10   < > = values in this interval not displayed.    Recent Labs  Lab 09/19/2019 1545 09/19/19 0624  PROT 5.5* 4.8*  ALBUMIN 2.7* 2.0*  AST 55* 54*  ALT 37 39  ALKPHOS 87 120  BILITOT 2.0* 0.8   Hematology Recent Labs  Lab 09/19/19 0036 09/19/19 0036 09/19/19 0451 09/19/19 0624 09/19/19 0800 09/20/19 0922  WBC 19.3*  --   --  26.6*  --  19.5*  RBC 4.57  --   --  4.10*  --  3.97*  HGB 14.1   < > 12.6* 12.4*  --  12.2*  HCT 43.6   < > 37.0*  38.7*  --  37.3*  MCV 95.4  --   --  94.4  --  94.0  MCH 30.9  --   --  30.2  --  30.7  MCHC 32.3  --   --  32.0  --  32.7  RDW 16.1*  --   --  16.1*  --  16.4*  PLT 69*   < >  --  120* 120* PLATELET CLUMPS NOTED ON SMEAR, UNABLE TO ESTIMATE   < > = values in this interval not displayed.   BNP Recent Labs  Lab 09/25/2019 1831  BNP 1,252.0*    DDimer  Recent Labs  Lab 10/07/2019 1831 09/19/19 0800  DDIMER 17.50* 16.62*     Radiology/Studies:  CT Code Stroke CTA Head W/WO contrast  Result Date: 10/08/2019 CLINICAL DATA:  Encephalopathy. COVID-19 EXAM: CT ANGIOGRAPHY HEAD AND NECK CT PERFUSION BRAIN TECHNIQUE: Multidetector CT imaging of the head and neck was performed using the standard protocol during bolus administration of intravenous contrast. Multiplanar CT image reconstructions and MIPs were obtained to evaluate the vascular anatomy. Carotid stenosis measurements (when applicable) are obtained utilizing NASCET criteria, using the distal internal carotid diameter as the denominator. Multiphase CT imaging of the brain was performed following IV bolus contrast injection. Subsequent parametric perfusion maps were calculated using RAPID software. CONTRAST:  59m OMNIPAQUE IOHEXOL 350 MG/ML SOLN COMPARISON:  None. FINDINGS: CTA NECK FINDINGS SKELETON: There is no bony spinal canal stenosis. No lytic or blastic lesion. OTHER NECK: Normal pharynx, larynx and major salivary glands. No cervical lymphadenopathy. Unremarkable thyroid gland. UPPER CHEST: No pneumothorax or pleural effusion. No nodules or masses. AORTIC ARCH: There is mild calcific atherosclerosis of the aortic arch. There is no aneurysm, dissection or hemodynamically significant stenosis of the visualized portion of the aorta. Conventional 3 vessel aortic branching pattern. The visualized proximal subclavian arteries are widely patent. RIGHT CAROTID SYSTEM: Normal without aneurysm, dissection or stenosis. LEFT  CAROTID SYSTEM: Normal  without aneurysm, dissection or stenosis. VERTEBRAL ARTERIES: Right dominant configuration. Both origins are clearly patent. There is no dissection, occlusion or flow-limiting stenosis to the skull base (V1-V3 segments). CTA HEAD FINDINGS POSTERIOR CIRCULATION: --Vertebral arteries: Normal V4 segments. --Posterior inferior cerebellar arteries (PICA): Patent origins from the vertebral arteries. --Anterior inferior cerebellar arteries (AICA): Patent origins from the basilar artery. --Basilar artery: Normal. --Superior cerebellar arteries: Normal. --Posterior cerebral arteries: Normal. Both originate from the basilar artery. Posterior communicating arteries (p-comm) are diminutive or absent. ANTERIOR CIRCULATION: --Intracranial internal carotid arteries: Normal. --Anterior cerebral arteries (ACA): Normal. Both A1 segments are present. Patent anterior communicating artery (a-comm). --Middle cerebral arteries (MCA): Normal. VENOUS SINUSES: As permitted by contrast timing, patent. ANATOMIC VARIANTS: None Review of the MIP images confirms the above findings. CT Brain Perfusion Findings: ASPECTS: 10 CBF (<30%) Volume: 91m Perfusion (Tmax>6.0s) volume: 0565mMismatch Volume: 65m63mnfarction Location:None IMPRESSION: 1. No emergent large vessel occlusion or hemodynamically significant stenosis by NASCET criteria. 2. Normal cerebral CT perfusion. Electronically Signed   By: KevUlyses JarredD.   On: 09/22/2019 22:34   CT Head Wo Contrast  Result Date: 10/13/2019 CLINICAL DATA:  Altered mental status. EXAM: CT HEAD WITHOUT CONTRAST TECHNIQUE: Contiguous axial images were obtained from the base of the skull through the vertex without intravenous contrast. COMPARISON:  None. FINDINGS: Brain: Evaluation was degraded by motion artifact. There is a 1.6 cm hypoattenuating area in the right frontal lobe (axial series 2, image 31). There is no acute intracranial abnormality. No acute intracranial hemorrhage. There is atrophy with  chronic microvascular ischemic changes. Vascular: No hyperdense vessel or unexpected calcification. Skull: Normal. Negative for fracture or focal lesion. Sinuses/Orbits: There is mucosal thickening of the left maxillary sinus with an air-fluid level. There is a complete opacification of the left sphenoid sinus. There is mucosal thickening of the ethmoid air cells. The mastoid air cells are essentially clear. Other: None. IMPRESSION: 1. Motion degraded study. 2. 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 While this is favored to be chronic, if there is clinical concern for an acute stroke follow-up with MRI is recommended. 3. No acute intracranial hemorrhage. 4. Atrophy and chronic microvascular ischemic changes are noted. 5. Sinus disease as detailed above. Electronically Signed   By: ChrConstance HolsterD.   On: 09/17/2019 20:24   CT Code Stroke CTA Neck W/WO contrast  Result Date: 09/24/2019 CLINICAL DATA:  Encephalopathy. COVID-19 EXAM: CT ANGIOGRAPHY HEAD AND NECK CT PERFUSION BRAIN TECHNIQUE: Multidetector CT imaging of the head and neck was performed using the standard protocol during bolus administration of intravenous contrast. Multiplanar CT image reconstructions and MIPs were obtained to evaluate the vascular anatomy. Carotid stenosis measurements (when applicable) are obtained utilizing NASCET criteria, using the distal internal carotid diameter as the denominator. Multiphase CT imaging of the brain was performed following IV bolus contrast injection. Subsequent parametric perfusion maps were calculated using RAPID software. CONTRAST:  965m26mNIPAQUE IOHEXOL 350 MG/ML SOLN COMPARISON:  None. FINDINGS: CTA NECK FINDINGS SKELETON: There is no bony spinal canal stenosis. No lytic or blastic lesion. OTHER NECK: Normal pharynx, larynx and major salivary glands. No cervical lymphadenopathy. Unremarkable thyroid gland. UPPER CHEST: No pneumothorax or pleural effusion. No nodules or masses.  AORTIC ARCH: There is mild calcific atherosclerosis of the aortic arch. There is no aneurysm, dissection or hemodynamically significant stenosis of the visualized portion of the aorta. Conventional 3 vessel aortic branching pattern. The visualized proximal subclavian arteries are widely patent. RIGHT CAROTID  SYSTEM: Normal without aneurysm, dissection or stenosis. LEFT CAROTID SYSTEM: Normal without aneurysm, dissection or stenosis. VERTEBRAL ARTERIES: Right dominant configuration. Both origins are clearly patent. There is no dissection, occlusion or flow-limiting stenosis to the skull base (V1-V3 segments). CTA HEAD FINDINGS POSTERIOR CIRCULATION: --Vertebral arteries: Normal V4 segments. --Posterior inferior cerebellar arteries (PICA): Patent origins from the vertebral arteries. --Anterior inferior cerebellar arteries (AICA): Patent origins from the basilar artery. --Basilar artery: Normal. --Superior cerebellar arteries: Normal. --Posterior cerebral arteries: Normal. Both originate from the basilar artery. Posterior communicating arteries (p-comm) are diminutive or absent. ANTERIOR CIRCULATION: --Intracranial internal carotid arteries: Normal. --Anterior cerebral arteries (ACA): Normal. Both A1 segments are present. Patent anterior communicating artery (a-comm). --Middle cerebral arteries (MCA): Normal. VENOUS SINUSES: As permitted by contrast timing, patent. ANATOMIC VARIANTS: None Review of the MIP images confirms the above findings. CT Brain Perfusion Findings: ASPECTS: 10 CBF (<30%) Volume: 57m Perfusion (Tmax>6.0s) volume: 034mMismatch Volume: 69m64mnfarction Location:None IMPRESSION: 1. No emergent large vessel occlusion or hemodynamically significant stenosis by NASCET criteria. 2. Normal cerebral CT perfusion. Electronically Signed   By: KevUlyses JarredD.   On: 09/25/2019 22:34   MR BRAIN WO CONTRAST  Result Date: 09/19/2019 CLINICAL DATA:  Encephalopathy, COVID-19 EXAM: MRI HEAD WITHOUT CONTRAST  TECHNIQUE: Multiplanar, multiecho pulse sequences of the brain and surrounding structures were obtained without intravenous contrast. COMPARISON:  Correlation made with recent CT imaging FINDINGS: Brain: Multiple small foci reduced diffusion are present involving bilateral cerebral and cerebellar hemispheres. There is no evidence intracranial hemorrhage. Chronic infarction involving the right middle frontal gyrus. Additional patchy T2 hyperintensity in the supratentorial white matter is nonspecific but may reflect mild chronic microvascular ischemic changes. Prominence of the ventricles and sulci reflects mild generalized parenchymal volume loss. Punctate focus of susceptibility in the parasagittal left parietal lobe is compatible with chronic microhemorrhage. There is no intracranial mass, mass effect, or edema. There is no hydrocephalus or extra-axial fluid collection. Vascular: Major vessel flow voids at the skull base are preserved. Skull and upper cervical spine: Normal marrow signal is preserved. Sinuses/Orbits: Near total opacification of the sphenoid sinus. Additional left posterior ethmoid and maxillary sinus partial opacification. Bilateral lens replacements. Other: Sella is unremarkable.  Patchy mastoid fluid opacification. IMPRESSION: Multiple small acute infarcts involving bilateral anterior and posterior circulations. Chronic right frontal infarct. Mild chronic microvascular ischemic changes. Electronically Signed   By: PraMacy MisD.   On: 09/19/2019 13:48   CT Code Stroke Cerebral Perfusion with contrast  Result Date: 09/23/2019 CLINICAL DATA:  Encephalopathy. COVID-19 EXAM: CT ANGIOGRAPHY HEAD AND NECK CT PERFUSION BRAIN TECHNIQUE: Multidetector CT imaging of the head and neck was performed using the standard protocol during bolus administration of intravenous contrast. Multiplanar CT image reconstructions and MIPs were obtained to evaluate the vascular anatomy. Carotid stenosis measurements  (when applicable) are obtained utilizing NASCET criteria, using the distal internal carotid diameter as the denominator. Multiphase CT imaging of the brain was performed following IV bolus contrast injection. Subsequent parametric perfusion maps were calculated using RAPID software. CONTRAST:  969m24mNIPAQUE IOHEXOL 350 MG/ML SOLN COMPARISON:  None. FINDINGS: CTA NECK FINDINGS SKELETON: There is no bony spinal canal stenosis. No lytic or blastic lesion. OTHER NECK: Normal pharynx, larynx and major salivary glands. No cervical lymphadenopathy. Unremarkable thyroid gland. UPPER CHEST: No pneumothorax or pleural effusion. No nodules or masses. AORTIC ARCH: There is mild calcific atherosclerosis of the aortic arch. There is no aneurysm, dissection or hemodynamically significant stenosis of the visualized portion of the  aorta. Conventional 3 vessel aortic branching pattern. The visualized proximal subclavian arteries are widely patent. RIGHT CAROTID SYSTEM: Normal without aneurysm, dissection or stenosis. LEFT CAROTID SYSTEM: Normal without aneurysm, dissection or stenosis. VERTEBRAL ARTERIES: Right dominant configuration. Both origins are clearly patent. There is no dissection, occlusion or flow-limiting stenosis to the skull base (V1-V3 segments). CTA HEAD FINDINGS POSTERIOR CIRCULATION: --Vertebral arteries: Normal V4 segments. --Posterior inferior cerebellar arteries (PICA): Patent origins from the vertebral arteries. --Anterior inferior cerebellar arteries (AICA): Patent origins from the basilar artery. --Basilar artery: Normal. --Superior cerebellar arteries: Normal. --Posterior cerebral arteries: Normal. Both originate from the basilar artery. Posterior communicating arteries (p-comm) are diminutive or absent. ANTERIOR CIRCULATION: --Intracranial internal carotid arteries: Normal. --Anterior cerebral arteries (ACA): Normal. Both A1 segments are present. Patent anterior communicating artery (a-comm). --Middle  cerebral arteries (MCA): Normal. VENOUS SINUSES: As permitted by contrast timing, patent. ANATOMIC VARIANTS: None Review of the MIP images confirms the above findings. CT Brain Perfusion Findings: ASPECTS: 10 CBF (<30%) Volume: 66m Perfusion (Tmax>6.0s) volume: 017mMismatch Volume: 51m62mnfarction Location:None IMPRESSION: 1. No emergent large vessel occlusion or hemodynamically significant stenosis by NASCET criteria. 2. Normal cerebral CT perfusion. Electronically Signed   By: KevUlyses JarredD.   On: 09/17/2019 22:34   DG CHEST PORT 1 VIEW  Result Date: 09/19/2019 CLINICAL DATA:  Central line placement EXAM: PORTABLE CHEST 1 VIEW COMPARISON:  Yesterday FINDINGS: Left IJ line with tip at the upper SVC. Enteric and endotracheal tubes remain in good position. Airspace disease at both lung bases. Emphysema in the apical lungs. Cardiomegaly. These results will be called to the ordering clinician or representative by the Radiologist Assistant, and communication documented in the PACS or zVision Dashboard. IMPRESSION: 1. New left IJ line with tip at the upper SVC. Evidence of a trace left apical pneumothorax but this finding is stable from preprocedural radiograph yesterday and may be from patient's emphysema. Recommend radiographic follow-up. 2. Bilateral pneumonia without interval progression. 3. Cardiomegaly. Electronically Signed   By: JonMonte FantasiaD.   On: 09/19/2019 05:43   DG Chest Portable 1 View  Result Date: 10/04/2019 CLINICAL DATA:  Status post intubation EXAM: PORTABLE CHEST 1 VIEW COMPARISON:  Film from earlier in the same day. FINDINGS: Cardiac shadow is enlarged but stable. Endotracheal tube and gastric catheter are noted in satisfactory position. Patchy opacities are noted in both lungs similar to that seen on the prior exam. No pneumothorax or sizable effusion is seen. No bony abnormality is noted. IMPRESSION: Stable appearance of the chest with the exception of interval endotracheal  intubation and gastric catheter placement in satisfactory position. Electronically Signed   By: MarInez CatalinaD.   On: 10/14/2019 23:49   DG Chest Portable 1 View  Result Date: 10/02/2019 CLINICAL DATA:  Fever EXAM: PORTABLE CHEST 1 VIEW COMPARISON:  September 06, 2019 FINDINGS: The heart size and mediastinal contours are unchanged. Aortic knob calcifications. Slight interval increase in the patchy airspace opacity seen at both lung bases. There is increased interstitial markings seen at both lung bases. Hyperinflation of the upper lung zones is noted. No acute osseous abnormality. IMPRESSION: Slight interval increase in the patchy/interstitial markings at both lung bases which could be due to atelectasis and/or infectious etiology. Electronically Signed   By: BinPrudencio PairD.   On: 09/19/2019 15:29   EEG adult  Result Date: 09/19/2019 YadLora HavensD     09/19/2019  3:39 PM Patient Name: JamJOVAN COLLIGANN: [id number] Attending: PriCecil Cranker  Hortense Ramal Referring Physician/Provider: Dr. Kathrynn Speed Date: 09/19/2019 Duration: 22.31 mins Patient history: 80 year old male presented with acute encephalopathy in the setting of recent Covid pneumonia.  EEG to evaluate for seizures. Level of alertness: Comatose AEDs during EEG study: Versed Technical aspects: This EEG study was done with scalp electrodes positioned according to the 10-20 International system of electrode placement. Electrical activity was acquired at a sampling rate of 500Hz  and reviewed with a high frequency filter of 70Hz  and a low frequency filter of 1Hz . EEG data were recorded continuously and digitally stored. Description: EEG showed continuous generalized 2 to 3 Hz delta slowing as well as intermittent generalized, maximal frontocentral 13 to 15 Hz beta activity.  EEG was reactive to tactile stimulation.  Hyperventilation photic stimulation were not performed due to AMS. Abnormality -Continuous slow, generalized IMPRESSION: This study  is suggestive of severe diffuse encephalopathy, nonspecific etiology but could be secondary to sedation. No seizures or epileptiform discharges were seen throughout the recording. Priyanka O Yadav   VAS [place] LOWER EXTREMITY VENOUS (DVT)  Result Date: 09/20/2019  Lower Venous DVT Study Indications: Covid-19.  Limitations: Ventilation. Comparison Study: No prior study on file Performing Technologist: Sharion Dove RVS  Examination Guidelines: A complete evaluation includes B-mode imaging, spectral Doppler, color Doppler, and power Doppler as needed of all accessible portions of each vessel. Bilateral testing is considered an integral part of a complete examination. Limited examinations for reoccurring indications may be performed as noted. The reflux portion of the exam is performed with the patient in reverse Trendelenburg.  +---------+---------------+---------+-----------+----------+--------------+ RIGHT    CompressibilityPhasicitySpontaneityPropertiesThrombus Aging +---------+---------------+---------+-----------+----------+--------------+ CFV      Full           Yes      No                                  +---------+---------------+---------+-----------+----------+--------------+ SFJ      Full                                                        +---------+---------------+---------+-----------+----------+--------------+ FV Prox  Full                                                        +---------+---------------+---------+-----------+----------+--------------+ FV Mid   Full                                                        +---------+---------------+---------+-----------+----------+--------------+ FV DistalFull                                                        +---------+---------------+---------+-----------+----------+--------------+ PFV      Full                                                         +---------+---------------+---------+-----------+----------+--------------+  POP      Full           Yes      Yes                                 +---------+---------------+---------+-----------+----------+--------------+ PTV      Full                                                        +---------+---------------+---------+-----------+----------+--------------+ PERO     Full                                                        +---------+---------------+---------+-----------+----------+--------------+   +---------+---------------+---------+-----------+----------+--------------+ LEFT     CompressibilityPhasicitySpontaneityPropertiesThrombus Aging +---------+---------------+---------+-----------+----------+--------------+ CFV      Full           No       No                                  +---------+---------------+---------+-----------+----------+--------------+ SFJ      Full                                                        +---------+---------------+---------+-----------+----------+--------------+ FV Prox  Full                                                        +---------+---------------+---------+-----------+----------+--------------+ FV Mid   Full                                                        +---------+---------------+---------+-----------+----------+--------------+ FV DistalFull                                                        +---------+---------------+---------+-----------+----------+--------------+ PFV      Full                                                        +---------+---------------+---------+-----------+----------+--------------+ POP      Full           Yes      Yes                                 +---------+---------------+---------+-----------+----------+--------------+  PTV      Full                                                         +---------+---------------+---------+-----------+----------+--------------+ PERO     Full                                                        +---------+---------------+---------+-----------+----------+--------------+     Summary: BILATERAL: - No evidence of deep vein thrombosis seen in the lower extremities, bilaterally. - No evidence of superficial venous thrombosis in the lower extremities, bilaterally.   *See table(s) above for measurements and observations.    Preliminary    ECHOCARDIOGRAM LIMITED  Result Date: 09/20/2019   ECHOCARDIOGRAM LIMITED REPORT   Patient Name:   Chris Lewis Date of Exam: 09/19/2019 Medical Rec #:  [id number]     Height:       68.0 in Accession #:    [id number]    Weight:       177.7 lb Date of Birth:  11-May-1941     BSA:          1.94 m Patient Age:    7 years      BP:           103/78 mmHg Patient Gender: M             HR:           79 bpm. Exam Location:  Inpatient  Procedure: Limited Echo, Limited Color Doppler and Color Doppler Indications:    Acute Myocardial Infarction 410  History:        Patient has prior history of Echocardiogram examinations, most                 recent 04/05/2019. Mitral Valve Repair 08/03/2011. Stoke, COPD,                 Covid-19 Positive  Sonographer:    Mikki Santee RDCS (AE) Referring Phys: [id number] [place]  1. Global right ventricle has severely reduced systolic function.The right ventricular size is severely enlarged. Right ventricular wall thickness was not assessed.  2. Left ventricular ejection fraction, by visual estimation, is 35%. The left ventricle has severely decreased function.  3. Left ventricular diastolic parameters are indeterminate.  4. Right atrial size was moderately dilated.  5. The mitral valve has been repaired, 08/03/2011. Annuloplasty ring appears normal, no evidence of dehiscence. Trivial mitral valve regurgitation. No evidence of mitral stenosis. Mean diastolic gradient 3 mmHg at HR 78 bpm.   6. Tricuspid valve regurgitation moderate.  7. Mildly elevated pulmonary artery systolic pressure.  8. The inferior vena cava is dilated in size with <50% respiratory variability, suggesting right atrial pressure of 15 mmHg. FINDINGS  Left Ventricle: Left ventricular ejection fraction, by visual estimation, is 35%. The left ventricle has severely decreased function. Left ventricular diastolic parameters are indeterminate. Right Ventricle: The right ventricular size is severely enlarged. Right vetricular wall thickness was not assessed. Global RV systolic function is has severely reduced systolic function. The tricuspid regurgitant velocity is 2.28 m/s, and with an assumed  right atrial pressure of 15  mmHg, the estimated right ventricular systolic pressure is mildly elevated at 35.7 mmHg. Left Atrium: Left atrial size was normal in size. Right Atrium: Right atrial size was moderately dilated. Right atrial pressure is estimated at 15 mmHg. Mitral Valve: The mitral valve has been repaired/replaced. No evidence of mitral valve stenosis by observation. MV Area by PHT, 2.09 cm. MV PHT, 105.27 msec. MV peak gradient, 6.4 mmHg. Trivial mitral valve regurgitation. Tricuspid Valve: Tricuspid valve regurgitation moderate. Aortic Valve: The aortic valve is tricuspid. . There is moderate thickening of the aortic valve. There is moderate thickening of the aortic valve. Aorta: The aortic root is normal in size and structure, the aortic arch was not well visualized and the ascending aorta was not well visualized. Venous: The inferior vena cava is dilated in size with less than 50% respiratory variability, suggesting right atrial pressure of 15 mmHg. Shunts: The atrial septum is grossly normal.  LEFT VENTRICLE          Normals PLAX 2D LVIDd:         4.00 cm  3.6 cm   Diastology                 Normals LVIDs:         3.20 cm  1.7 cm   LV e' lateral:   6.00 cm/s 6.42 cm/s LV PW:         1.00 cm  1.4 cm   LV E/e' lateral: 16.8       15.4 LV IVS:        0.70 cm  1.3 cm   LV e' medial:    3.83 cm/s 6.96 cm/s LVOT diam:     2.40 cm  2.0 cm   LV E/e' medial:  26.4      6.96 LV SV:         29 ml    79 ml LV SV Index:   14.65    45 ml/m2 LVOT Area:     4.52 cm 3.14 cm2  RIGHT VENTRICLE RV S prime:     9.30 cm/s TAPSE (M-mode): 1.9 cm LEFT ATRIUM           Index       RIGHT ATRIUM           Index LA diam:      3.40 cm 1.75 cm/m  RA Area:     29.80 cm LA Vol (A4C): 42.1 ml 21.66 ml/m RA Volume:   127.00 ml 65.34 ml/m  AORTIC VALVE             Normals LVOT Vmax:   58.50 cm/s LVOT Vmean:  37.500 cm/s 75 cm/s LVOT VTI:    0.113 m     25.3 cm  AORTA                 Normals Ao Root diam: 3.70 cm 31 mm MITRAL VALVE               Normals   TRICUSPID VALVE             Normals MV Area (PHT): 2.09 cm              TR Peak grad:   20.7 mmHg MV Peak grad:  6.4 mmHg    4 mmHg    TR Vmax:        236.00 cm/s 288 cm/s MV Mean grad:  3.0 mmHg MV Vmax:       1.26 m/s  SHUNTS MV Vmean:      87.5 cm/s             Systemic VTI:  0.11 m MV VTI:        0.39 m                Systemic Diam: 2.40 cm MV PHT:        105.27 msec 55 ms MV Decel Time: 363 msec    187 ms MV E velocity: 101.00 cm/s 103 cm/s MV A velocity: 56.30 cm/s  70.3 cm/s MV E/A ratio:  1.79        1.5  Cherlynn Kaiser MD Electronically signed by Cherlynn Kaiser MD Signature Date/Time: 09/20/2019/8:24:22 AMThe mitral valve has been repaired/replaced.    Final     Assessment and Plan:   MRSA UTI -blood cultures have been negative thus far -there is concern for endocarditis which would require TEE to further evaluate. However, given his current condition he is not a good candidate. He has a history of achalasia which would not be amenable to the procedure. If he were to regain neurologic recovery, be able to perform and pass, would re-evaluate performing TEE at that time to determine duration of antibiotic therapy. For now, would continue treating empirically.   Acute HFrEF with EF of 35%    -likely secondary to viral myocarditis in the setting of recent covid-19 infection -doubt ischemic based on history  -will do limited echo study with definity to evaluate for LV thrombus to determine need for anticoagulation -if he is able to wean off pressors and have meaningful neurologic recovery can initiate goal directed medical therapy for heart failure   For questions or updates, please contact [facility] Please consult [email or web address] for contact info under    Signed, Delice Bison, DO  09/20/2019 1:58 PM

## 2019-09-20 NOTE — Progress Notes (Signed)
VASCULAR LAB PRELIMINARY  PRELIMINARY  PRELIMINARY  PRELIMINARY  Bilateral lower extremity venous duplex completed.    Preliminary report:  See CV proc for preliminary results.   Larence Thone, RVT 09/20/2019, 12:36 PM

## 2019-09-21 ENCOUNTER — Inpatient Hospital Stay (HOSPITAL_COMMUNITY): Payer: BC Managed Care – PPO

## 2019-09-21 DIAGNOSIS — I6310 Cerebral infarction due to embolism of unspecified precerebral artery: Secondary | ICD-10-CM

## 2019-09-21 DIAGNOSIS — I517 Cardiomegaly: Secondary | ICD-10-CM

## 2019-09-21 DIAGNOSIS — I5041 Acute combined systolic (congestive) and diastolic (congestive) heart failure: Secondary | ICD-10-CM

## 2019-09-21 LAB — CBC
HCT: 36 % — ABNORMAL LOW (ref 39.0–52.0)
Hemoglobin: 11.6 g/dL — ABNORMAL LOW (ref 13.0–17.0)
MCH: 30.7 pg (ref 26.0–34.0)
MCHC: 32.2 g/dL (ref 30.0–36.0)
MCV: 95.2 fL (ref 80.0–100.0)
Platelets: 84 10*3/uL — ABNORMAL LOW (ref 150–400)
RBC: 3.78 MIL/uL — ABNORMAL LOW (ref 4.22–5.81)
RDW: 16.6 % — ABNORMAL HIGH (ref 11.5–15.5)
WBC: 13.8 10*3/uL — ABNORMAL HIGH (ref 4.0–10.5)
nRBC: 0.2 % (ref 0.0–0.2)

## 2019-09-21 LAB — COMPREHENSIVE METABOLIC PANEL
ALT: 28 U/L (ref 0–44)
AST: 21 U/L (ref 15–41)
Albumin: 1.8 g/dL — ABNORMAL LOW (ref 3.5–5.0)
Alkaline Phosphatase: 78 U/L (ref 38–126)
Anion gap: 8 (ref 5–15)
BUN: 46 mg/dL — ABNORMAL HIGH (ref 8–23)
CO2: 23 mmol/L (ref 22–32)
Calcium: 7.2 mg/dL — ABNORMAL LOW (ref 8.9–10.3)
Chloride: 114 mmol/L — ABNORMAL HIGH (ref 98–111)
Creatinine, Ser: 1.28 mg/dL — ABNORMAL HIGH (ref 0.61–1.24)
GFR calc Af Amer: >60 mL/min (ref >60)
GFR calc non Af Amer: 53 mL/min — ABNORMAL LOW (ref >60)
Glucose, Bld: 190 mg/dL — ABNORMAL HIGH (ref 70–99)
Potassium: 3.7 mmol/L (ref 3.5–5.1)
Sodium: 145 mmol/L (ref 135–145)
Total Bilirubin: 0.8 mg/dL (ref 0.3–1.2)
Total Protein: 4.8 g/dL — ABNORMAL LOW (ref 6.5–8.1)

## 2019-09-21 LAB — POCT I-STAT 7, (LYTES, BLD GAS, ICA,H+H)
Acid-base deficit: 4 mmol/L — ABNORMAL HIGH (ref 0.0–2.0)
Bicarbonate: 20.7 mmol/L (ref 20.0–28.0)
Calcium, Ion: 1.13 mmol/L — ABNORMAL LOW (ref 1.15–1.40)
HCT: 34 % — ABNORMAL LOW (ref 39.0–52.0)
Hemoglobin: 11.6 g/dL — ABNORMAL LOW (ref 13.0–17.0)
O2 Saturation: 94 %
Patient temperature: 99.3
Potassium: 4 mmol/L (ref 3.5–5.1)
Sodium: 144 mmol/L (ref 135–145)
TCO2: 22 mmol/L (ref 22–32)
pCO2 arterial: 36.6 mmHg (ref 32.0–48.0)
pH, Arterial: 7.363 (ref 7.350–7.450)
pO2, Arterial: 75 mmHg — ABNORMAL LOW (ref 83.0–108.0)

## 2019-09-21 LAB — GLUCOSE, CAPILLARY
Glucose-Capillary: 146 mg/dL — ABNORMAL HIGH (ref 70–99)
Glucose-Capillary: 158 mg/dL — ABNORMAL HIGH (ref 70–99)
Glucose-Capillary: 185 mg/dL — ABNORMAL HIGH (ref 70–99)
Glucose-Capillary: 193 mg/dL — ABNORMAL HIGH (ref 70–99)
Glucose-Capillary: 201 mg/dL — ABNORMAL HIGH (ref 70–99)
Glucose-Capillary: 204 mg/dL — ABNORMAL HIGH (ref 70–99)

## 2019-09-21 LAB — LIPID PANEL
Cholesterol: 134 mg/dL (ref 0–200)
HDL: 21 mg/dL — ABNORMAL LOW
LDL Cholesterol: 90 mg/dL (ref 0–99)
Total CHOL/HDL Ratio: 6.4 ratio
Triglycerides: 113 mg/dL
VLDL: 23 mg/dL (ref 0–40)

## 2019-09-21 LAB — ECHOCARDIOGRAM LIMITED
Height: 68 in
Weight: 2952.4 oz

## 2019-09-21 LAB — PHOSPHORUS: Phosphorus: 2.3 mg/dL — ABNORMAL LOW (ref 2.5–4.6)

## 2019-09-21 LAB — VANCOMYCIN, TROUGH: Vancomycin Tr: 10 ug/mL — ABNORMAL LOW (ref 15–20)

## 2019-09-21 LAB — PATHOLOGIST SMEAR REVIEW

## 2019-09-21 LAB — MAGNESIUM: Magnesium: 2.5 mg/dL — ABNORMAL HIGH (ref 1.7–2.4)

## 2019-09-21 MED ORDER — POTASSIUM CHLORIDE 20 MEQ/15ML (10%) PO SOLN
20.0000 meq | ORAL | Status: AC
  Administered 2019-09-21 (×2): 20 meq
  Filled 2019-09-21 (×2): qty 15

## 2019-09-21 MED ORDER — SODIUM CHLORIDE 0.9 % IV SOLN
2.0000 g | Freq: Two times a day (BID) | INTRAVENOUS | Status: DC
  Administered 2019-09-21 – 2019-09-22 (×3): 2 g via INTRAVENOUS
  Filled 2019-09-21 (×4): qty 20

## 2019-09-21 MED ORDER — PERFLUTREN LIPID MICROSPHERE
1.0000 mL | INTRAVENOUS | Status: AC | PRN
  Administered 2019-09-21: 09:00:00 4 mL via INTRAVENOUS
  Filled 2019-09-21: qty 10

## 2019-09-21 NOTE — Progress Notes (Signed)
Echocardiogram 2D Echocardiogram limited with definity has been performed.  Chris Lewis M 09/21/2019, 9:06 AM

## 2019-09-21 NOTE — Plan of Care (Signed)
  Problem: Education: Goal: Knowledge of risk factors and measures for prevention of condition will improve Outcome: Progressing   Problem: Coping: Goal: Psychosocial and spiritual needs will be supported Outcome: Progressing   Problem: Respiratory: Goal: Will maintain a patent airway Outcome: Progressing Goal: Complications related to the disease process, condition or treatment will be avoided or minimized Outcome: Progressing   Problem: Education: Goal: Knowledge of General Education information will improve Description: Including pain rating scale, medication(s)/side effects and non-pharmacologic comfort measures Outcome: Progressing   Problem: Health Behavior/Discharge Planning: Goal: Ability to manage health-related needs will improve Outcome: Progressing   Problem: Clinical Measurements: Goal: Ability to maintain clinical measurements within normal limits will improve Outcome: Progressing Goal: Will remain free from infection Outcome: Progressing Goal: Diagnostic test results will improve Outcome: Progressing Goal: Respiratory complications will improve Outcome: Progressing Goal: Cardiovascular complication will be avoided Outcome: Progressing   Problem: Activity: Goal: Risk for activity intolerance will decrease Outcome: Progressing   Problem: Nutrition: Goal: Adequate nutrition will be maintained Outcome: Progressing   Problem: Coping: Goal: Level of anxiety will decrease Outcome: Progressing   Problem: Pain Managment: Goal: General experience of comfort will improve Outcome: Progressing   Problem: Safety: Goal: Ability to remain free from injury will improve Outcome: Progressing   Problem: Skin Integrity: Goal: Risk for impaired skin integrity will decrease Outcome: Progressing   Problem: Education: Goal: Knowledge of disease or condition will improve Outcome: Progressing Goal: Knowledge of secondary prevention will improve Outcome:  Progressing Goal: Knowledge of patient specific risk factors addressed and post discharge goals established will improve Outcome: Progressing Goal: Individualized Educational Video(s) Outcome: Progressing   Problem: Coping: Goal: Will verbalize positive feelings about self Outcome: Progressing Goal: Will identify appropriate support needs Outcome: Progressing   Problem: Health Behavior/Discharge Planning: Goal: Ability to manage health-related needs will improve Outcome: Progressing   Problem: Self-Care: Goal: Ability to participate in self-care as condition permits will improve Outcome: Progressing Goal: Verbalization of feelings and concerns over difficulty with self-care will improve Outcome: Progressing Goal: Ability to communicate needs accurately will improve Outcome: Progressing   Problem: Nutrition: Goal: Risk of aspiration will decrease Outcome: Progressing Goal: Dietary intake will improve Outcome: Progressing

## 2019-09-21 NOTE — Progress Notes (Signed)
Okay to do recruitment maneuver PRN per verbal order from Dr. Oletta Darter.

## 2019-09-21 NOTE — Progress Notes (Signed)
Pharmacy Antibiotic Note  Chris Lewis is a 79 y.o. male admitted on 10/07/2019 with altered mental status, slight interval worsening of CXR, recent COVID-19 admission. Pt was worked up for meningitis with LP, bacterial meningitis was ruled out. Also concern for endocarditis, unable to do TEE at this time. Pt does have evidence of septic emboli. SCr 1.6 > 1.2, WBC 19 > 13. Positive for MRSA UTI.   Plan: -Discontinue ampicillin, flagyl -Change cefepime to ceftriaxone 2 g IV 12h, keep high dose due to septic emboli -Continue vancomycin, check levels tonight -If endocarditis is ruled out, continue vancomycin for 7 days   Height: 5\' 8"  (172.7 cm)(Per family) Weight: 184 lb 8.4 oz (83.7 kg) IBW/kg (Calculated) : 68.4  Temp (24hrs), Avg:99.3 F (37.4 C), Min:99 F (37.2 C), Max:99.9 F (37.7 C)  Recent Labs  Lab 09/30/2019 1545 09/26/2019 1831 09/19/19 0036 09/19/19 0624 09/20/19 0922 09/20/19 1110 09/21/19 0354  WBC 13.5*  --  19.3* 26.6* 19.5*  --  13.8*  CREATININE 1.61*  --  1.49* 1.83* 1.60*  --  1.28*  LATICACIDVEN 4.6* 3.4*  --  2.9*  --  2.7*  --     Estimated Creatinine Clearance: 48.5 mL/min (A) (by C-G formula based on SCr of 1.28 mg/dL (H)).    Allergies  Allergen Reactions  . Duac [Clindamycin Phos-Benzoyl Perox] Rash  . Contrast Media [Iodinated Diagnostic Agents] Rash    Heavy rash and itching 20 yrs ago. Needs full premeds and does well with this.  . Atorvastatin Other (See Comments)    myalgias  . Propoxyphene N-Acetaminophen Itching    rash  . Rosuvastatin Other (See Comments)    myalgias  . Metrizamide Rash    radiopaque contrast agent. Heavy rash and itching 20 yrs ago. Needs full premeds and does well with this.  Orie Fisherman Vegetable Orange [Psyllium] Other (See Comments)    Heartburn   . Other Rash    High acid food--tomatoes, orange juice  . Tomato Other (See Comments)    Heartburn     Harvel Quale 09/21/2019 12:12 PM

## 2019-09-21 NOTE — Progress Notes (Signed)
[facility] ADULT ICU REPLACEMENT PROTOCOL FOR AM LAB REPLACEMENT ONLY  The patient does apply for the [facility] Adult ICU Electrolyte Replacment Protocol based on the criteria listed below:   1. Is GFR >/= 40 ml/min? Yes.    Patient's GFR today is 53 2. Is urine output >/= 0.5 ml/kg/hr for the last 6 hours? Yes.   Patient's UOP is .9 ml/kg/hr 3. Is BUN < 60 mg/dL? Yes.    Patient's BUN today is 16 4. Abnormal electrolyte(s): K-3.7 5. Ordered repletion with: per protopcol 6. If a panic level lab has been reported, has the CCM MD in charge been notified? Yes.  .   Physician:  Dr. Terrill Mohr, Philis Nettle 09/21/2019 6:03 AM

## 2019-09-21 NOTE — Progress Notes (Signed)
Brief cardiology update note:  Please see full consult note from yesterday. 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 High risk of perforation. Would need repeat barium study before TEE could be considered, which would require significant clinical improvement.  TTE with contrast done today, no evidence of LV thrombus.   He remains on pressor support and cannot start goal directed medical therapy for heart failure. Coox supports this is at least not primarily cardiogenic shock.  [facility] will sign off at this time. If his clinical scenario improves, please get barium study and contact us after. At that time, we would be happy to re-evaluate safety for TEE.  Chris Dresser, MD, PhD [facility]  [address] [place], [place] [address] [phone]

## 2019-09-21 NOTE — Progress Notes (Signed)
STROKE TEAM PROGRESS NOTE   INTERVAL HISTORY His Rn is at bedside. noted.  Patient apparently was following some commands yesterday but then developed respiratory distress requiring sedation and condition seems to have declined.  He remains on ventilatory support for respiratory failure and on sedation.  TEE has not been done.  Lower extremity venous Dopplers are negative for DVT  Vitals:   09/21/19 1100 09/21/19 1115 09/21/19 1215 09/21/19 1230  BP: 117/84 118/88 113/75 117/79  Pulse: 88 88 87 88  Resp: (!) 33 (!) 38 (!) 35 (!) 34  Temp:      TempSrc:      SpO2: 96% 96% 94% 96%  Weight:      Height:        CBC:  Recent Labs  Lab 09/25/2019 1545 09/30/2019 1824 09/20/19 0922 09/20/19 0922 09/21/19 0354 09/21/19 1128  WBC 13.5*   < > 19.5*  --  13.8*  --   NEUTROABS 12.5*  --   --   --   --   --   HGB 13.8   < > 12.2*   < > 11.6* 11.6*  HCT 41.9   < > 37.3*   < > 36.0* 34.0*  MCV 92.5   < > 94.0  --  95.2  --   PLT 56*   < > PLATELET CLUMPS NOTED ON SMEAR, UNABLE TO ESTIMATE  --  84*  --    < > = values in this interval not displayed.    Basic Metabolic Panel:  Recent Labs  Lab 09/20/19 0129 09/20/19 0922 09/20/19 0922 09/20/19 1650 09/21/19 0354 09/21/19 1128  NA  --  140   < >  --  145 144  K  --  3.7   < >  --  3.7 4.0  CL  --  110  --   --  114*  --   CO2  --  20*  --   --  23  --   GLUCOSE  --  258*  --   --  190*  --   BUN  --  48*  --   --  46*  --   CREATININE  --  1.60*  --   --  1.28*  --   CALCIUM  --  7.2*  --   --  7.2*  --   MG   < >  --   --  2.3 2.5*  --   PHOS   < >  --   --  2.4* 2.3*  --    < > = values in this interval not displayed.   Lipid Panel:     Component Value Date/Time   CHOL 134 09/21/2019 0354   TRIG 113 09/21/2019 0354   HDL 21 (L) 09/21/2019 0354   CHOLHDL 6.4 09/21/2019 0354   VLDL 23 09/21/2019 0354   LDLCALC 90 09/21/2019 0354   HgbA1c:  Lab Results  Component Value Date   HGBA1C 6.8 (H) 09/20/2019   Urine Drug  Screen:     Component Value Date/Time   LABOPIA NONE DETECTED 10/27/2015 2210   COCAINSCRNUR NONE DETECTED 10/27/2015 2210   LABBENZ NONE DETECTED 10/27/2015 2210   AMPHETMU NONE DETECTED 10/27/2015 2210   THCU NONE DETECTED 10/27/2015 2210   LABBARB NONE DETECTED 10/27/2015 2210    Alcohol Level     Component Value Date/Time   ETH <5 10/27/2015 1855    IMAGING past 48 hours MR BRAIN WO CONTRAST  Result  Date: 09/19/2019 CLINICAL DATA:  Encephalopathy, COVID-19 EXAM: MRI HEAD WITHOUT CONTRAST TECHNIQUE: Multiplanar, multiecho pulse sequences of the brain and surrounding structures were obtained without intravenous contrast. COMPARISON:  Correlation made with recent CT imaging FINDINGS: Brain: Multiple small foci reduced diffusion are present involving bilateral cerebral and cerebellar hemispheres. There is no evidence intracranial hemorrhage. Chronic infarction involving the right middle frontal gyrus. Additional patchy T2 hyperintensity in the supratentorial white matter is nonspecific but may reflect mild chronic microvascular ischemic changes. Prominence of the ventricles and sulci reflects mild generalized parenchymal volume loss. Punctate focus of susceptibility in the parasagittal left parietal lobe is compatible with chronic microhemorrhage. There is no intracranial mass, mass effect, or edema. There is no hydrocephalus or extra-axial fluid collection. Vascular: Major vessel flow voids at the skull base are preserved. Skull and upper cervical spine: Normal marrow signal is preserved. Sinuses/Orbits: Near total opacification of the sphenoid sinus. Additional left posterior ethmoid and maxillary sinus partial opacification. Bilateral lens replacements. Other: Sella is unremarkable.  Patchy mastoid fluid opacification. IMPRESSION: Multiple small acute infarcts involving bilateral anterior and posterior circulations. Chronic right frontal infarct. Mild chronic microvascular ischemic changes.  Electronically Signed   By: Macy Mis M.D.   On: 09/19/2019 13:48   EEG adult  Result Date: 09/19/2019 Lora Havens, MD     09/19/2019  3:39 PM Patient Name: Chris Lewis MRN: [id number] Epilepsy Attending: Lora Havens Referring Physician/Provider: Dr. Kathrynn Speed Date: 09/19/2019 Duration: 22.31 mins Patient history: 79 year old male presented with acute encephalopathy in the setting of recent Covid pneumonia.  EEG to evaluate for seizures. Level of alertness: Comatose AEDs during EEG study: Versed Technical aspects: This EEG study was done with scalp electrodes positioned according to the 10-20 International system of electrode placement. Electrical activity was acquired at a sampling rate of 500Hz  and reviewed with a high frequency filter of 70Hz  and a low frequency filter of 1Hz . EEG data were recorded continuously and digitally stored. Description: EEG showed continuous generalized 2 to 3 Hz delta slowing as well as intermittent generalized, maximal frontocentral 13 to 15 Hz beta activity.  EEG was reactive to tactile stimulation.  Hyperventilation photic stimulation were not performed due to AMS. Abnormality -Continuous slow, generalized IMPRESSION: This study is suggestive of severe diffuse encephalopathy, nonspecific etiology but could be secondary to sedation. No seizures or epileptiform discharges were seen throughout the recording. Priyanka O Yadav   VAS [place] LOWER EXTREMITY VENOUS (DVT)  Result Date: 09/20/2019  Lower Venous DVT Study Indications: Covid-19.  Limitations: Ventilation. Comparison Study: No prior study on file Performing Technologist: Sharion Dove RVS  Examination Guidelines: A complete evaluation includes B-mode imaging, spectral Doppler, color Doppler, and power Doppler as needed of all accessible portions of each vessel. Bilateral testing is considered an integral part of a complete examination. Limited examinations for reoccurring indications may be performed as  noted. The reflux portion of the exam is performed with the patient in reverse Trendelenburg.  +---------+---------------+---------+-----------+----------+--------------+ RIGHT    CompressibilityPhasicitySpontaneityPropertiesThrombus Aging +---------+---------------+---------+-----------+----------+--------------+ CFV      Full           Yes      No                                  +---------+---------------+---------+-----------+----------+--------------+ SFJ      Full                                                        +---------+---------------+---------+-----------+----------+--------------+  FV Prox  Full                                                        +---------+---------------+---------+-----------+----------+--------------+ FV Mid   Full                                                        +---------+---------------+---------+-----------+----------+--------------+ FV DistalFull                                                        +---------+---------------+---------+-----------+----------+--------------+ PFV      Full                                                        +---------+---------------+---------+-----------+----------+--------------+ POP      Full           Yes      Yes                                 +---------+---------------+---------+-----------+----------+--------------+ PTV      Full                                                        +---------+---------------+---------+-----------+----------+--------------+ PERO     Full                                                        +---------+---------------+---------+-----------+----------+--------------+   +---------+---------------+---------+-----------+----------+--------------+ LEFT     CompressibilityPhasicitySpontaneityPropertiesThrombus Aging +---------+---------------+---------+-----------+----------+--------------+ CFV      Full            No       No                                  +---------+---------------+---------+-----------+----------+--------------+ SFJ      Full                                                        +---------+---------------+---------+-----------+----------+--------------+ FV Prox  Full                                                        +---------+---------------+---------+-----------+----------+--------------+  FV Mid   Full                                                        +---------+---------------+---------+-----------+----------+--------------+ FV DistalFull                                                        +---------+---------------+---------+-----------+----------+--------------+ PFV      Full                                                        +---------+---------------+---------+-----------+----------+--------------+ POP      Full           Yes      Yes                                 +---------+---------------+---------+-----------+----------+--------------+ PTV      Full                                                        +---------+---------------+---------+-----------+----------+--------------+ PERO     Full                                                        +---------+---------------+---------+-----------+----------+--------------+     Summary: BILATERAL: - No evidence of deep vein thrombosis seen in the lower extremities, bilaterally. - No evidence of superficial venous thrombosis in the lower extremities, bilaterally.   *See table(s) above for measurements and observations. Electronically signed by Servando Snare MD on 09/20/2019 at 2:47:00 PM.    Final     PHYSICAL EXAM Frail elderly Caucasian male who is intubated.   He appears to be in respiratory distress. . Afebrile. Head is nontraumatic. Neck is supple without bruit.    Cardiac exam no murmur or gallop. Lungs are clear to auscultation. Distal pulses are well  felt. Neurological Exam :  Patient is sedated and intubated.  Eyes are closed.  He does not respond to verbal stimuli.  Eyes are in primary position pupils 3 mm sluggishly reactive.  Corneal reflexes are sluggish t.  Doll's eye movements are sluggish.  Fundi were not visualized.  He does not follow any commands.  There are no spontaneous extremity movements but he does semilocalize to pain more in the left upper and lower extremity than on the right.  Deep tendon reflexes are symmetric.  Plantars are both equivocal.  ASSESSMENT/PLAN Chris Lewis is a 79 y.o. male with history of COPD, HLD, MVP s/p repair, TIA in 2004, hypothyroidism, depression/anxiety who was was admitted at [facility] for [place] 08/27/2019 thru 09/10/2019, fell while  trying to get OOB at home and found altered with progressive confusion, not speaking by family. Concern for aphasia and R sided weakness in the ED.   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 No hemorrhage. Small vessel disease. Atrophy. Sinus dz.   CTA head & neck no ELVO.   CT perfusion normal  MRI  Multiple small B anterior and posterior circulation infarcts. Old R frontal lobe infarct. Mild small vessel disease.  2D Echo RV - severely reduced systolic fxn, severe increase in size. EF 35%. MV repair appears intact.  LE dopplers neg  TEE pending   EEG continuous slowing  LDL 90 mg percent  HgbA1c 6.8  Heparin 5000 units sq tid for VTE prophylaxis  No antithrombotic prior to admission, now on No antithrombotic. 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 Permissive hypertension (OK if < 220/120) but gradually normalize in 5-7 days . Long-term BP goal normotensive  Hyperlipidemia  Home meds:   No statin d/t intolerance  LDL pending, goal < 70  May Consider [facility] at d/c   Other Stroke Risk Factors  Advanced age  Former Cigarette smoker, quit 22 yrs ago  Hx stroke/TIA  10/2002 - TIA w/ transient R HH   Family hx stroke (father)  Coronary artery disease  MV stenosis s/p repair  Other Active Problems  AKI / [place]  Hypothyroidism    Anxiety/pain  Hospital day # 3   Patient presented with altered mental status and encephalopathy of unclear etiology but clearly has multiple small cortical and subcortical infarcts related to Covid hypercoagulability, vasculitis or cardiogenic emboli from myocarditis or depressed cardiac function.  Interestingly he also has aseptic meningitis which appears to be nonbacterial and the time course of his Covid infection is not in favor of Covid meningoencephalitis as he tested positive 4 weeks ago.  His prognosis appears quite poor given multisystem involvement and poor mental status.  Recommend continue supportive care for the next few days to see if there is any meaningful improvement.  Recommend transesophageal echocardiogram to look for vegetations, clots and cardiac source of embolism.  .  Need to discuss with family goals of care.  Discussed with Dr. Tamala Julian critical care medicine. This patient is critically ill and at significant risk of neurological worsening, death and care requires constant monitoring of vital signs,  hemodynamics,respiratory and cardiac monitoring, extensive review of multiple databases, frequent neurological assessment, discussion with family, other specialists and medical decision making of high complexity.I have made any additions or clarifications directly to the above note.This critical care time does not reflect procedure time, or teaching time or supervisory time of PA/NP/Med Resident etc but could involve care discussion time.  I spent 30 minutes of neurocritical care time  in the care of  this patient.    Antony Contras, MD Medical Director [facility] Pager: [phone] 09/21/2019 12:44 PM *  To contact Stroke Continuity provider, please refer to [email or web address] After hours, contact General Neurology

## 2019-09-21 NOTE — Progress Notes (Signed)
NAME:  Chris Lewis, MRN:  [id number], DOB:  05-12-1941, LOS: 3 ADMISSION DATE:  09/30/2019, CONSULTATION DATE:  09/17/2019 REFERRING MD:  Alvino Chapel, CHIEF COMPLAINT:  AMS, fall  Brief History   79 year old man with a history of COPD (emphysema seen on CT scan), Achalasia, anxiety/depression, HLD, hypothyroidism, RBBB, MR s/p MV repair, Recently hospitalized at [facility] for covid PNA (1/12-1/25) now here with AMS. Per wife he fell overnight while trying to get out of bed on his own.  AM of admit was found altered and not speaking by his family.    Past Medical History  Achalasia, anxiety/depression, HLD, hypothyroidism, RBBB, MR s/p MV repair, Recently hospitalized at [facility] for covid PNA (1/12-1/25)   Significant Hospital Events   2/02 Admit   Consults:  PCCM  Neurology   Procedures:  ETT 2/2 >>   Significant Diagnostic Tests:  CT Head 2/2 >> motion degraded study, age-indeterminate infarct involving the R frontal lobe CTA Head / Neck / Perfusion 2/2 >> no emergent large vessel occlusion, normal cerebral CT perfusion  Micro Data:  MRSA PCR 2/3 >> positive  UC 2/2 >> MRSA 100k CSF 2/4 >> NG BCx2 2/2 >> NG  Antimicrobials:   Anti-infectives (From admission, onward)   Start     Dose/Rate Route Frequency Ordered Stop   09/19/19 1000  ceFEPIme (MAXIPIME) 2 g in sodium chloride 0.9 % 100 mL IVPB     2 g 200 mL/hr over 30 Minutes Intravenous Every 12 hours 09/19/19 0113     09/19/19 0200  metroNIDAZOLE (FLAGYL) IVPB 500 mg     500 mg 100 mL/hr over 60 Minutes Intravenous Every 8 hours 09/19/19 0113     09/19/19 0130  ampicillin (OMNIPEN) 2 g in sodium chloride 0.9 % 100 mL IVPB     2 g 300 mL/hr over 20 Minutes Intravenous Every 6 hours 09/19/19 0113     09/19/19 0015  piperacillin-tazobactam (ZOSYN) IVPB 3.375 g  Status:  Discontinued     3.375 g 12.5 mL/hr over 240 Minutes Intravenous Every 8 hours 09/19/19 0001 09/19/19 0115   09/26/2019 2315  vancomycin (VANCOCIN) IVPB 1000 mg/200 mL  premix     1,000 mg 200 mL/hr over 60 Minutes Intravenous Every 24 hours 09/22/2019 2303     09/26/2019 2200  ceFEPIme (MAXIPIME) 2 g in sodium chloride 0.9 % 100 mL IVPB  Status:  Discontinued     2 g 200 mL/hr over 30 Minutes Intravenous Every 12 hours 09/30/2019 1656 09/19/19 0001   10/09/2019 1600  cefTRIAXone (ROCEPHIN) 1 g in sodium chloride 0.9 % 100 mL IVPB     1 g 200 mL/hr over 30 Minutes Intravenous  Once 10/09/2019 1548 10/09/2019 1639       Interim history/subjective:  No events. Remains obtunded on vent and appears air hungry. SVO2 was 75% which is less c/w cardiogenic shock although he's on levophed  Objective   Blood pressure 110/76, pulse 89, temperature 99.3 F (37.4 C), temperature source Axillary, resp. rate (!) 38, height 5\' 8"  (1.727 m), weight 83.7 kg, SpO2 94 %.    Vent Mode: PRVC FiO2 (%):  [40 %] 40 % Set Rate:  [20 bmp] 20 bmp Vt Set:  [540 mL] 540 mL PEEP:  [5 cmH20] 5 cmH20 Pressure Support:  [10 cmH20] 10 cmH20 Plateau Pressure:  [14 cmH20] 14 cmH20   Intake/Output Summary (Last 24 hours) at 09/21/2019 1045 Last data filed at 09/21/2019 1005 Gross per 24 hour  Intake  4789.62 ml  Output 1150 ml  Net 3639.62 ml   Filed Weights   09/19/19 0322 09/20/19 0434 09/21/19 0411  Weight: 80.6 kg 78.6 kg 83.7 kg    Examination: GEN: ill appearing man on vent HEENT: ETT in place, minimal secretions CV: RRR, cool ext PULM: Scattered rhonci, no wheezing GI: Soft, +BS EXT: Minimal edema NEURO: Cannot get him to withdraw to pain PSYCH: N/A SKIN: pale  Sugars a bit up Cr better Has some hypernatremia Albumin 1.8 Plts 84, HgB stable, WBC improved  Resolved Hospital Problem list     Assessment & Plan:   Acute Metabolic Encephalopathy: multifactorial, out of proportion to stroke alone, mild meningitic picture by LP.  Neurology following. - Continue supportive care, serial neuro exams, vanc/ceftriaxone  Acute Hypoxemic Respiratory Failure: hx of COVID, COPD,  severe emphysema on CT imaging from prior admission.  Recent admission for covid PNA 1/12-1/25, discharged on 2L O2. Urine strep negative.  -low Vt ventilation PRVC 4-8cc/kg -wean PEEP / FiO2 for sats > 90% -VAP prevention measures  -follow intermittent CXR -abx as above -hx of COVID, 23 days out, doubt current symptoms relate to COVID   Shock  Suspect mixed shock with biventricular failure and MRSA (MRSA in urine is blood-borne until proven otherwise) LVEF 35%, IVC not collapsing on [place]  -continue broad spectrum abx -vasopressors for MAP >65 -hold further fluids, no response with straight leg raise  -continue stress dose steroids -Co-ox less consistent with cardiogenic  NSTEMI Acute BiVentricular Failure / COVID Myocarditis  RBBB old.  Trop elevated but trending down.  -tele monitoring  -Cardiology consulted -assess SvO2 -vasopressors for MAP >65 -consider TEE to r/o vegetation with MRSA UTI at later time after a barium swallow per discussion with cardiology  Thrombocytopenia -monitor, no indication for transfusion at this time  AKI  - likely ATN, improving NGMA  -Trend BMP / urinary output -Replace electrolytes as indicated -Avoid nephrotoxic agents, ensure adequate renal perfusion  Hypothyroidism  TSH 2.7 on admit  -continue synthroid   Hyperglycemia  -SSI, moderate scale  -lantus 5 units QD   Anxiety/pain  - keep as minimal as possible for better neuro exam - will start some fentanyl for air hunger today   Overall- do not have a great unifying diagnosis, continue to treat what we can with antibiotics, vent support, pressors, and limiting sedation.  Guarded prognosis.  If in same spot tomorrow consider pan-scan to look for nidus of continued septic shock.  Best practice:  Diet: npo Pain/Anxiety/Delirium protocol (if indicated): PAD protocol  VAP protocol (if indicated): in place DVT prophylaxis: heparin GI prophylaxis: protonix  Glucose control:  SSI Mobility: BR Code Status: DNR, shocks ok, intubation ok.    Family Communication: Wife updated today, told her 50/50 chance he would live through this although entire picture remains unclear Disposition: ICU  Labs   CBC: Recent Labs  Lab 10/14/2019 1545 09/23/2019 1824 09/19/19 0036 09/19/19 0451 09/19/19 [id number] 09/19/19 0800 09/20/19 0922 09/21/19 0354  WBC 13.5*  --  19.3*  --  26.6*  --  19.5* 13.8*  NEUTROABS 12.5*  --   --   --   --   --   --   --   HGB 13.8   < > 14.1 12.6* 12.4*  --  12.2* 11.6*  HCT 41.9   < > 43.6 37.0* 38.7*  --  37.3* 36.0*  MCV 92.5  --  95.4  --  94.4  --  94.0 95.2  PLT 56*   < > 69*  --  120* 120* PLATELET CLUMPS NOTED ON SMEAR, UNABLE TO ESTIMATE 84*   < > = values in this interval not displayed.    Basic Metabolic Panel: Recent Labs  Lab 09/29/2019 1545 10/07/2019 1824 09/19/19 0028 09/19/19 0036 09/19/19 0451 09/19/19 [id number] 09/19/19 1844 09/20/19 0129 09/20/19 0922 09/20/19 1650 09/21/19 0354  NA 132*   < > 135  --  135 137  --   --  140  --  145  K 4.5   < > 4.0  --  4.0 4.7  --   --  3.7  --  3.7  CL 98  --   --   --   --  103  --   --  110  --  114*  CO2 19*  --   --   --   --  20*  --   --  20*  --  23  GLUCOSE 121*  --   --   --   --  172*  --   --  258*  --  190*  BUN 25*  --   --   --   --  30*  --   --  48*  --  46*  CREATININE 1.61*  --   --  1.49*  --  1.83*  --   --  1.60*  --  1.28*  CALCIUM 7.9*  --   --   --   --  7.1*  --   --  7.2*  --  7.2*  MG  --   --   --   --   --   --  1.9 1.9  --  2.3 2.5*  PHOS  --   --   --   --   --   --  4.6 3.7  --  2.4* 2.3*   < > = values in this interval not displayed.   GFR: Estimated Creatinine Clearance: 48.5 mL/min (A) (by C-G formula based on SCr of 1.28 mg/dL (H)). Recent Labs  Lab 09/23/2019 1545 09/25/2019 1545 09/29/2019 1831 09/19/19 0036 09/19/19 0624 09/20/19 0922 09/20/19 1110 09/21/19 0354  PROCALCITON  --   --  3.36  --   --   --   --   --   WBC 13.5*   < >  --  19.3*  26.6* 19.5*  --  13.8*  LATICACIDVEN 4.6*  --  3.4*  --  2.9*  --  2.7*  --    < > = values in this interval not displayed.    Liver Function Tests: Recent Labs  Lab 09/27/2019 1545 09/19/19 0624 09/21/19 0354  AST 55* 54* 21  ALT 37 39 28  ALKPHOS 87 120 78  BILITOT 2.0* 0.8 0.8  PROT 5.5* 4.8* 4.8*  ALBUMIN 2.7* 2.0* 1.8*   No results for input(s): LIPASE, AMYLASE in the last 168 hours. No results for input(s): AMMONIA in the last 168 hours.  ABG    Component Value Date/Time   PHART 7.314 (L) 09/19/2019 0451   PCO2ART 39.9 09/19/2019 0451   PO2ART 102.0 09/19/2019 0451   HCO3 19.8 (L) 09/19/2019 0451   TCO2 21 (L) 09/19/2019 0451   ACIDBASEDEF 5.0 (H) 09/19/2019 0451   O2SAT 75.1 09/20/2019 1345     Coagulation Profile: Recent Labs  Lab 10/13/2019 1831 09/19/19 0800  INR 1.3* 1.3*    Cardiac Enzymes: No results for input(s): CKTOTAL, CKMB, CKMBINDEX,  TROPONINI in the last 168 hours.  HbA1C: Hgb A1c MFr Bld  Date/Time Value Ref Range Status  09/20/2019 01:29 AM 6.8 (H) 4.8 - 5.6 % Final    Comment:    (NOTE) Pre diabetes:          5.7%-6.4% Diabetes:              >6.4% Glycemic control for   <7.0% adults with diabetes   08/29/2018 12:09 PM 5.9 4.6 - 6.5 % Final    Comment:    Glycemic Control Guidelines for People with Diabetes:Non Diabetic:  <6%Goal of Therapy: <7%Additional Action Suggested:  >8%     CBG: Recent Labs  Lab 09/20/19 1554 09/20/19 2017 09/20/19 2335 09/21/19 0330 09/21/19 0815  GLUCAP 228* 223* 294* 201* 146*      The patient is critically ill with multiple organ systems failure and requires high complexity decision making for assessment and support, frequent evaluation and titration of therapies, application of advanced monitoring technologies and extensive interpretation of multiple databases. Critical Care Time devoted to patient care services described in this note independent of APP/resident time (if applicable)  is 32  minutes.   Erskine Emery MD [facility] 09/21/2019 6:11 PM Personal pager: [phone] If unanswered, please page CCM On-call: [phone]

## 2019-09-22 ENCOUNTER — Inpatient Hospital Stay (HOSPITAL_COMMUNITY): Payer: BC Managed Care – PPO

## 2019-09-22 DIAGNOSIS — I50811 Acute right heart failure: Secondary | ICD-10-CM | POA: Diagnosis not present

## 2019-09-22 DIAGNOSIS — N179 Acute kidney failure, unspecified: Secondary | ICD-10-CM

## 2019-09-22 DIAGNOSIS — R579 Shock, unspecified: Secondary | ICD-10-CM | POA: Diagnosis not present

## 2019-09-22 DIAGNOSIS — I2699 Other pulmonary embolism without acute cor pulmonale: Secondary | ICD-10-CM | POA: Diagnosis not present

## 2019-09-22 DIAGNOSIS — Z9911 Dependence on respirator [ventilator] status: Secondary | ICD-10-CM | POA: Diagnosis not present

## 2019-09-22 DIAGNOSIS — N390 Urinary tract infection, site not specified: Secondary | ICD-10-CM

## 2019-09-22 DIAGNOSIS — R918 Other nonspecific abnormal finding of lung field: Secondary | ICD-10-CM | POA: Diagnosis not present

## 2019-09-22 DIAGNOSIS — Z452 Encounter for adjustment and management of vascular access device: Secondary | ICD-10-CM | POA: Diagnosis not present

## 2019-09-22 DIAGNOSIS — I6310 Cerebral infarction due to embolism of unspecified precerebral artery: Secondary | ICD-10-CM | POA: Diagnosis not present

## 2019-09-22 DIAGNOSIS — I5041 Acute combined systolic (congestive) and diastolic (congestive) heart failure: Secondary | ICD-10-CM | POA: Diagnosis not present

## 2019-09-22 LAB — BRAIN NATRIURETIC PEPTIDE: B Natriuretic Peptide: 1998.2 pg/mL — ABNORMAL HIGH (ref 0.0–100.0)

## 2019-09-22 LAB — GLUCOSE, CAPILLARY
Glucose-Capillary: 197 mg/dL — ABNORMAL HIGH (ref 70–99)
Glucose-Capillary: 200 mg/dL — ABNORMAL HIGH (ref 70–99)
Glucose-Capillary: 209 mg/dL — ABNORMAL HIGH (ref 70–99)
Glucose-Capillary: 213 mg/dL — ABNORMAL HIGH (ref 70–99)
Glucose-Capillary: 225 mg/dL — ABNORMAL HIGH (ref 70–99)
Glucose-Capillary: 231 mg/dL — ABNORMAL HIGH (ref 70–99)
Glucose-Capillary: 234 mg/dL — ABNORMAL HIGH (ref 70–99)

## 2019-09-22 LAB — BASIC METABOLIC PANEL
Anion gap: 7 (ref 5–15)
BUN: 46 mg/dL — ABNORMAL HIGH (ref 8–23)
CO2: 23 mmol/L (ref 22–32)
Calcium: 7.1 mg/dL — ABNORMAL LOW (ref 8.9–10.3)
Chloride: 114 mmol/L — ABNORMAL HIGH (ref 98–111)
Creatinine, Ser: 1.21 mg/dL (ref 0.61–1.24)
GFR calc Af Amer: >60 mL/min (ref >60)
GFR calc non Af Amer: 56 mL/min — ABNORMAL LOW (ref >60)
Glucose, Bld: 207 mg/dL — ABNORMAL HIGH (ref 70–99)
Potassium: 4.3 mmol/L (ref 3.5–5.1)
Sodium: 144 mmol/L (ref 135–145)

## 2019-09-22 LAB — CBC
HCT: 35.6 % — ABNORMAL LOW (ref 39.0–52.0)
Hemoglobin: 11.6 g/dL — ABNORMAL LOW (ref 13.0–17.0)
MCH: 30.8 pg (ref 26.0–34.0)
MCHC: 32.6 g/dL (ref 30.0–36.0)
MCV: 94.4 fL (ref 80.0–100.0)
Platelets: 90 10*3/uL — ABNORMAL LOW (ref 150–400)
RBC: 3.77 MIL/uL — ABNORMAL LOW (ref 4.22–5.81)
RDW: 16.7 % — ABNORMAL HIGH (ref 11.5–15.5)
WBC: 15.3 10*3/uL — ABNORMAL HIGH (ref 4.0–10.5)
nRBC: 0.3 % — ABNORMAL HIGH (ref 0.0–0.2)

## 2019-09-22 LAB — TROPONIN I (HIGH SENSITIVITY): Troponin I (High Sensitivity): 267 ng/L (ref <18)

## 2019-09-22 LAB — HEPARIN LEVEL (UNFRACTIONATED): Heparin Unfractionated: <0.1 IU/mL — ABNORMAL LOW (ref 0.30–0.70)

## 2019-09-22 LAB — VANCOMYCIN, PEAK: Vancomycin Pk: 34 ug/mL (ref 30–40)

## 2019-09-22 MED ORDER — IPRATROPIUM-ALBUTEROL 0.5-2.5 (3) MG/3ML IN SOLN
RESPIRATORY_TRACT | Status: AC
  Administered 2019-09-22: 3 mL via RESPIRATORY_TRACT
  Filled 2019-09-22: qty 3

## 2019-09-22 MED ORDER — HEPARIN (PORCINE) 25000 UT/250ML-% IV SOLN
1900.0000 [IU]/h | INTRAVENOUS | Status: DC
  Administered 2019-09-22: 12:00:00 1350 [IU]/h via INTRAVENOUS
  Administered 2019-09-23 (×2): 1300 [IU]/h via INTRAVENOUS
  Administered 2019-09-24 – 2019-09-27 (×5): 1400 [IU]/h via INTRAVENOUS
  Administered 2019-09-28: 1550 [IU]/h via INTRAVENOUS
  Administered 2019-09-29: 1900 [IU]/h via INTRAVENOUS
  Administered 2019-09-29: 1700 [IU]/h via INTRAVENOUS
  Filled 2019-09-22 (×12): qty 250

## 2019-09-22 MED ORDER — IPRATROPIUM-ALBUTEROL 0.5-2.5 (3) MG/3ML IN SOLN
3.0000 mL | RESPIRATORY_TRACT | Status: DC
  Administered 2019-09-22: 3 mL via RESPIRATORY_TRACT
  Filled 2019-09-22 (×2): qty 3

## 2019-09-22 MED ORDER — FREE WATER
200.0000 mL | Status: DC
  Administered 2019-09-22 – 2019-09-23 (×5): 200 mL

## 2019-09-22 MED ORDER — ASPIRIN 81 MG PO CHEW
81.0000 mg | CHEWABLE_TABLET | Freq: Every day | ORAL | Status: DC
  Administered 2019-09-23 – 2019-10-01 (×9): 81 mg
  Filled 2019-09-22 (×9): qty 1

## 2019-09-22 MED ORDER — DIPHENHYDRAMINE HCL 25 MG PO CAPS
50.0000 mg | ORAL_CAPSULE | Freq: Once | ORAL | Status: AC
  Filled 2019-09-22 (×2): qty 2

## 2019-09-22 MED ORDER — DIGOXIN 0.25 MG/ML IJ SOLN
0.2500 mg | Freq: Four times a day (QID) | INTRAMUSCULAR | Status: AC
  Administered 2019-09-23 (×2): 0.25 mg via INTRAVENOUS
  Filled 2019-09-22 (×2): qty 2

## 2019-09-22 MED ORDER — DIGOXIN 0.25 MG/ML IJ SOLN
0.5000 mg | INTRAMUSCULAR | Status: AC
  Administered 2019-09-22: 23:00:00 0.5 mg via INTRAVENOUS
  Filled 2019-09-22: qty 2

## 2019-09-22 MED ORDER — HEPARIN BOLUS VIA INFUSION
4000.0000 [IU] | Freq: Once | INTRAVENOUS | Status: AC
  Administered 2019-09-22: 4000 [IU] via INTRAVENOUS
  Filled 2019-09-22: qty 4000

## 2019-09-22 MED ORDER — HEPARIN BOLUS VIA INFUSION
4000.0000 [IU] | Freq: Once | INTRAVENOUS | Status: DC
  Filled 2019-09-22: qty 4000

## 2019-09-22 MED ORDER — DIPHENHYDRAMINE HCL 50 MG/ML IJ SOLN
50.0000 mg | Freq: Once | INTRAMUSCULAR | Status: AC
  Administered 2019-09-22: 13:00:00 50 mg via INTRAVENOUS
  Filled 2019-09-22: qty 1

## 2019-09-22 MED ORDER — IPRATROPIUM-ALBUTEROL 0.5-2.5 (3) MG/3ML IN SOLN: 3.0000 mL | RESPIRATORY_TRACT | Status: DC

## 2019-09-22 MED ORDER — INSULIN GLARGINE 100 UNIT/ML ~~LOC~~ SOLN
5.0000 [IU] | Freq: Once | SUBCUTANEOUS | Status: AC
  Administered 2019-09-22: 13:00:00 5 [IU] via SUBCUTANEOUS
  Filled 2019-09-22: qty 0.05

## 2019-09-22 MED ORDER — INSULIN ASPART 100 UNIT/ML ~~LOC~~ SOLN
3.0000 [IU] | Freq: Four times a day (QID) | SUBCUTANEOUS | Status: DC
  Administered 2019-09-22 – 2019-10-01 (×28): 3 [IU] via SUBCUTANEOUS

## 2019-09-22 MED ORDER — IOHEXOL 350 MG/ML SOLN
75.0000 mL | Freq: Once | INTRAVENOUS | Status: AC | PRN
  Administered 2019-09-22: 14:00:00 80 mL via INTRAVENOUS

## 2019-09-22 MED ORDER — FUROSEMIDE 10 MG/ML IJ SOLN
40.0000 mg | Freq: Four times a day (QID) | INTRAMUSCULAR | Status: AC
  Administered 2019-09-22 (×2): 40 mg via INTRAVENOUS
  Filled 2019-09-22 (×2): qty 4

## 2019-09-22 MED ORDER — HYDROCORTISONE NA SUCCINATE PF 250 MG IJ SOLR
200.0000 mg | Freq: Once | INTRAMUSCULAR | Status: AC
  Administered 2019-09-22: 10:00:00 200 mg via INTRAVENOUS
  Filled 2019-09-22: qty 200

## 2019-09-22 MED ORDER — INSULIN GLARGINE 100 UNIT/ML ~~LOC~~ SOLN
10.0000 [IU] | Freq: Every day | SUBCUTANEOUS | Status: DC
  Administered 2019-09-23: 11:00:00 10 [IU] via SUBCUTANEOUS
  Filled 2019-09-22 (×2): qty 0.1

## 2019-09-22 NOTE — Progress Notes (Signed)
[facility] Progress Note Patient Name: Chris Lewis DOB: 05/21/1941 MRN: [id number]   Date of Service  09/22/2019  HPI/Events of Note  Called for HR 160s in this patient with history of Afib. They are on levophed currently which beta-blockers / CCBs relatively contraindicated. Unfortunately, the patient also have an allergy listed to iodinated contrast dye which makes amiodarone relatively contraindicated as well.   eICU Interventions  Options are limited for management of this patient's narrow complex tachycardia (which is most likely Afib with RVR) as noted above.  Plan: - Digoxin IV load (0.5mg  IV x1 dose now, then 0.25mg  IV every 6 hours x3 doses)      Intervention Category Major Interventions: Arrhythmia - evaluation and management  Marily Lente Zaydan Papesh 09/22/2019, 11:05 PM

## 2019-09-22 NOTE — Progress Notes (Signed)
ANTICOAGULATION CONSULT NOTE - Initial Consult  Pharmacy Consult for Heparin Indication: pulmonary embolus  Allergies  Allergen Reactions  . Duac [Clindamycin Phos-Benzoyl Perox] Rash  . Contrast Media [Iodinated Diagnostic Agents] Rash    Heavy rash and itching 20 yrs ago. Needs full premeds and does well with this.  . Atorvastatin Other (See Comments)    myalgias  . Propoxyphene N-Acetaminophen Itching    rash  . Rosuvastatin Other (See Comments)    myalgias  . Metrizamide Rash    radiopaque contrast agent. Heavy rash and itching 20 yrs ago. Needs full premeds and does well with this.  Orie Fisherman Vegetable Orange [Psyllium] Other (See Comments)    Heartburn   . Other Rash    High acid food--tomatoes, orange juice  . Tomato Other (See Comments)    Heartburn     Patient Measurements: Height: 5\' 8"  (172.7 cm)(Per family) Weight: 193 lb 2 oz (87.6 kg) IBW/kg (Calculated) : 68.4 Heparin Dosing Weight: 80 kg  Vital Signs: Temp: 100.1 F (37.8 C) (02/06 0743) Temp Source: Axillary (02/06 0743) BP: 96/63 (02/06 0902) Pulse Rate: 93 (02/06 0902)  Labs: Recent Labs    09/20/19 [id number] 09/20/19 [id number] 09/21/19 0354 09/21/19 0354 09/21/19 1128 09/22/19 0219  HGB 12.2*   < > 11.6*   < > 11.6* 11.6*  HCT 37.3*   < > 36.0*  --  34.0* 35.6*  PLT PLATELET CLUMPS NOTED ON SMEAR, UNABLE TO ESTIMATE  --  84*  --   --  90*  CREATININE 1.60*  --  1.28*  --   --  1.21   < > = values in this interval not displayed.    Estimated Creatinine Clearance: 52.4 mL/min (by C-G formula based on SCr of 1.21 mg/dL).   Medical History: Past Medical History:  Diagnosis Date  . Achalasia   . Anxiety    causes shortness  . Arthritis   . COPD (chronic obstructive pulmonary disease) ([place])    with chronic SOB  . Depression   . Dilated aortic root ([place])    60mm by echo 03/2019  . FH: cholecystectomy   . Gallstones   . GERD (gastroesophageal reflux disease)   . Hiatal hernia   . History  of alcoholism ([place])   . Hypercholesterolemia   . Hypothyroidism 01/12/2016  . Lesion of right lung 06/04/2013  . MVP (mitral valve prolapse)    with severe MR s/p MV repair  . Osteopenia   . Pneumonia 06/04/2013   aspirated food 14  . RBBB   . S/P right inguinal herniorrhaphy   . Seasonal allergic rhinitis   . Stroke ([place]) 10/2002  . Transient ischemic attack      Assessment: 79 year old male with acute biventricular failure and concern for PE.   Lower extremity dopplers are negative for PE.  TTE negative for LV thrombus. TEE on hold - needs barium study.  CT Head - no hemorrhage. MRI with multiple infarcts.  Platelets are low at 90 but increased - suspect DIC.  No overt bleeding noted. Hgb is stable at 11.6.  Creatinine is improving.  Received Heparin SQ 5000 units at 5 AM   Goal of Therapy:  Heparin level 0.3-0.7 units/ml - Discussed with Dr. Carlis Abbott, will keep higher goal despite strokes due to clinical worsening and high suspicion of PE  Monitor platelets by anticoagulation protocol: Yes   Plan:  Start Heparin at 1350 units/hr.  No bolus per discussion with Dr. Carlis Abbott.  Heparin level  in 6 hours (early in this setting due to bleed risk vs clot risk)  Daily Heparin level and CBC.   Sloan Leiter, PharmD, BCPS, BCCCP Clinical Pharmacist Please refer to [facility] for [place] numbers 09/22/2019,9:59 AM

## 2019-09-22 NOTE — Progress Notes (Signed)
NAME:  Chris Lewis, MRN:  [id number], DOB:  Jun 06, 1941, LOS: 4 ADMISSION DATE:  10/13/2019, CONSULTATION DATE:  09/19/2019 REFERRING MD:  Alvino Chapel, CHIEF COMPLAINT:  AMS, fall  Brief History   79 year old man with a history of COPD (emphysema seen on CT scan), Achalasia, anxiety/depression, HLD, hypothyroidism, RBBB, MR s/p MV repair, Recently hospitalized at [facility] for covid PNA (1/12-1/25) now here with AMS. Per wife he fell overnight while trying to get out of bed on his own.  AM of admit was found altered and not speaking by his family.    Past Medical History  Achalasia, anxiety/depression, HLD, hypothyroidism, RBBB, MR s/p MV repair, Recently hospitalized at [facility] for covid PNA (1/12-1/25)   Significant Hospital Events   2/02 Admit   Consults:  PCCM  Neurology   Procedures:  ETT 2/2 >>  CVC  Significant Diagnostic Tests:  CT Head 2/2 >> motion degraded study, age-indeterminate infarct involving the R frontal lobe CTA Head / Neck / Perfusion 2/2 >> no emergent large vessel occlusion, normal cerebral CT perfusion Echo 2/5-dilated RV with reduced systolic function and evidence of overload, LVEF 35 to 40% with global hypokinesis  Micro Data:  MRSA PCR 2/3 >> positive  UC 2/2 >> MRSA 100k CSF 2/4 >> NG BCx2 2/2 >> NG  Antimicrobials:   Anti-infectives (From admission, onward)   Start     Dose/Rate Route Frequency Ordered Stop   09/21/19 2000  cefTRIAXone (ROCEPHIN) 2 g in sodium chloride 0.9 % 100 mL IVPB     2 g 200 mL/hr over 30 Minutes Intravenous Every 12 hours 09/21/19 1137     09/19/19 1000  ceFEPIme (MAXIPIME) 2 g in sodium chloride 0.9 % 100 mL IVPB  Status:  Discontinued     2 g 200 mL/hr over 30 Minutes Intravenous Every 12 hours 09/19/19 0113 09/21/19 1137   09/19/19 0200  metroNIDAZOLE (FLAGYL) IVPB 500 mg  Status:  Discontinued     500 mg 100 mL/hr over 60 Minutes Intravenous Every 8 hours 09/19/19 0113 09/21/19 1137   09/19/19 0130  ampicillin (OMNIPEN) 2 g in  sodium chloride 0.9 % 100 mL IVPB  Status:  Discontinued     2 g 300 mL/hr over 20 Minutes Intravenous Every 6 hours 09/19/19 0113 09/21/19 1137   09/19/19 0015  piperacillin-tazobactam (ZOSYN) IVPB 3.375 g  Status:  Discontinued     3.375 g 12.5 mL/hr over 240 Minutes Intravenous Every 8 hours 09/19/19 0001 09/19/19 0115   10/08/2019 2315  vancomycin (VANCOCIN) IVPB 1000 mg/200 mL premix     1,000 mg 200 mL/hr over 60 Minutes Intravenous Every 24 hours 10/14/2019 2303     10/13/2019 2200  ceFEPIme (MAXIPIME) 2 g in sodium chloride 0.9 % 100 mL IVPB  Status:  Discontinued     2 g 200 mL/hr over 30 Minutes Intravenous Every 12 hours 10/04/2019 1656 09/19/19 0001   09/30/2019 1600  cefTRIAXone (ROCEPHIN) 1 g in sodium chloride 0.9 % 100 mL IVPB     1 g 200 mL/hr over 30 Minutes Intravenous  Once 09/21/2019 1548 09/23/2019 1639       Interim history/subjective:  Feet and legs cooler than his arms, pulses dopplerable.  Was able to come down on sedation and off norepinephrine this morning.  Bradycardia overnight with Precedex while in A- flutter.  Objective   Blood pressure 99/68, pulse (!) 108, temperature 99.8 F (37.7 C), temperature source Axillary, resp. rate (!) 32, height 5\' 8"  (1.727  m), weight 87.6 kg, SpO2 94 %.    Vent Mode: PRVC FiO2 (%):  [40 %-50 %] 50 % Set Rate:  [20 bmp] 20 bmp Vt Set:  [540 mL] 540 mL PEEP:  [5 cmH20] 5 cmH20 Pressure Support:  [8 cmH20] 8 cmH20 Plateau Pressure:  [16 cmH20-20 cmH20] 16 cmH20   Intake/Output Summary (Last 24 hours) at 09/22/2019 1353 Last data filed at 09/22/2019 1213 Gross per 24 hour  Intake 1552.95 ml  Output 1680 ml  Net -127.05 ml   Filed Weights   09/20/19 0434 09/21/19 0411 09/22/19 0500  Weight: 78.6 kg 83.7 kg 87.6 kg    Examination: GEN: Critically ill-appearing man intubated, sedated HEENT: [place]/AT, scleral edema.  Mild periorbital edema CV: Regular rate and rhythm, sinus rhythm on the monitor.  No murmurs. PULM: Breathing  comfortably on the vent, evidence of expiratory air trapping on vent, but no wheezing. GI: Soft, nontender, nondistended EXT: Upper extremity edema, upper extremities warm.  Lower extremities cooler with decreased pulses.  No cyanosis, but pallor of lower extremities. NEURO: RASS -5, pupils reactive SKIN: Bruising on upper extremities, no wound  Platelets remain low-90  BNP 1998.2 <--1252 Trop 267 <--1476  Resolved Hospital Problem list     Assessment & Plan:   Acute Metabolic Encephalopathy: multifactorial, out of proportion to stroke alone, mild meningitic picture by LP.  Neurology following. -Continue supportive care and limit sedation as tolerated  Acute Hypoxemic Respiratory Failure: hx of COVID, COPD, severe emphysema on CT imaging from prior admission.  Recent admission for covid PNA 1/12-1/25, discharged on 2L O2. Urine strep negative.  -Continue low tidal volume ventilation, 48 cc/kg ideal body weight with goal plateau less than 30 and driving pressure less than 15.  Goal saturation greater than 88%.  Wean PEEP and FiO2 per ARDS ladder. -VAP prevention protocol -CTA today -Lasix; goal net negative fluid balance  Acute biventricular failure, dilated RV raises concern for PE -CTA chest today; requires premedication for allergy to contrast   Shock -unsure if septic versus obstructive due to PE versus cardiogenic Suspect mixed shock with biventricular failure and MRSA (MRSA in urine is blood-borne until proven otherwise) LVEF 35%, IVC not collapsing on [place]  -Continue broad-spectrum antibiotics -Vasopressors as required to maintain MAP greater than 65 -Full dose anticoagulation due to concern for PE -Continue stress dose steroids  NSTEMI Acute BiVentricular Failure / COVID Myocarditis  RBBB old.  Trop elevated but trending down.  -Continue telemetry monitoring -Keep magnesium and potassium levels within normal range -Recheck BNP and troponin today-concern for  PE -Appreciate cardiology's input  Thrombocytopenia; increases risk of bleeding due to anticoagulation -Monitor daily  AKI  - likely ATN, improved NGMA  -Daily BMP -Continue to monitor urine output -Lasix today -Avoid nephrotoxic meds and renally dose medications  Hypothyroidism  TSH 2.7 on admit  -Continue PTA Synthroid  Hyperglycemia- suboptimally controlled -Continue basal bolus insulin.  Anticipate hyperglycemia today with high-dose steroids prior to CT -Discussed regimen w/ pharmacy  Anxiety/pain  -Continue fentanyl for air hunger  Severe esophageal stenosis, history of achalasia -Unable to have TEE performed   Best practice:  Diet: npo Pain/Anxiety/Delirium protocol (if indicated): PAD protocol  VAP protocol (if indicated): in place DVT prophylaxis: heparin GI prophylaxis: protonix  Glucose control: SSI Mobility: BR Code Status: updated wife Jone  Disposition: ICU  Labs   CBC: Recent Labs  Lab 09/25/2019 1545 09/19/2019 1824 09/19/19 0036 09/19/19 0451 09/19/19 [id number] 09/19/19 0800 09/20/19 [id number] 09/21/19 0354 09/21/19 1128  09/22/19 0219  WBC 13.5*   < > 19.3*  --  26.6*  --  19.5* 13.8*  --  15.3*  NEUTROABS 12.5*  --   --   --   --   --   --   --   --   --   HGB 13.8   < > 14.1   < > 12.4*  --  12.2* 11.6* 11.6* 11.6*  HCT 41.9   < > 43.6   < > 38.7*  --  37.3* 36.0* 34.0* 35.6*  MCV 92.5   < > 95.4  --  94.4  --  94.0 95.2  --  94.4  PLT 56*   < > 69*   < > 120* 120* PLATELET CLUMPS NOTED ON SMEAR, UNABLE TO ESTIMATE 84*  --  90*   < > = values in this interval not displayed.    Basic Metabolic Panel: Recent Labs  Lab 09/27/2019 1545 10/14/2019 1824 09/19/19 0036 09/19/19 0451 09/19/19 [id number] 09/19/19 1844 09/20/19 0129 09/20/19 [id number] 09/20/19 1650 09/21/19 0354 09/21/19 1128 09/22/19 0219  NA 132*   < >  --    < > 137  --   --  140  --  145 144 144  K 4.5   < >  --    < > 4.7  --   --  3.7  --  3.7 4.0 4.3  CL 98  --   --   --  103  --   --   110  --  114*  --  114*  CO2 19*  --   --   --  20*  --   --  20*  --  23  --  23  GLUCOSE 121*  --   --   --  172*  --   --  258*  --  190*  --  207*  BUN 25*  --   --   --  30*  --   --  48*  --  46*  --  46*  CREATININE 1.61*   < > 1.49*  --  1.83*  --   --  1.60*  --  1.28*  --  1.21  CALCIUM 7.9*  --   --   --  7.1*  --   --  7.2*  --  7.2*  --  7.1*  MG  --   --   --   --   --  1.9 1.9  --  2.3 2.5*  --   --   PHOS  --   --   --   --   --  4.6 3.7  --  2.4* 2.3*  --   --    < > = values in this interval not displayed.   GFR: Estimated Creatinine Clearance: 52.4 mL/min (by C-G formula based on SCr of 1.21 mg/dL). Recent Labs  Lab 10/11/2019 1545 09/23/2019 1831 09/19/19 0036 09/19/19 0624 09/20/19 0922 09/20/19 1110 09/21/19 0354 09/22/19 0219  PROCALCITON  --  3.36  --   --   --   --   --   --   WBC 13.5*  --    < > 26.6* 19.5*  --  13.8* 15.3*  LATICACIDVEN 4.6* 3.4*  --  2.9*  --  2.7*  --   --    < > = values in this interval not displayed.    Liver Function Tests: Recent Labs  Lab 09/30/2019 1545  09/19/19 0624 09/21/19 0354  AST 55* 54* 21  ALT 37 39 28  ALKPHOS 87 120 78  BILITOT 2.0* 0.8 0.8  PROT 5.5* 4.8* 4.8*  ALBUMIN 2.7* 2.0* 1.8*   No results for input(s): LIPASE, AMYLASE in the last 168 hours. No results for input(s): AMMONIA in the last 168 hours.  ABG    Component Value Date/Time   PHART 7.363 09/21/2019 1128   PCO2ART 36.6 09/21/2019 1128   PO2ART 75.0 (L) 09/21/2019 1128   HCO3 20.7 09/21/2019 1128   TCO2 22 09/21/2019 1128   ACIDBASEDEF 4.0 (H) 09/21/2019 1128   O2SAT 94.0 09/21/2019 1128     Coagulation Profile: Recent Labs  Lab 10/04/2019 1831 09/19/19 0800  INR 1.3* 1.3*    Cardiac Enzymes: No results for input(s): CKTOTAL, CKMB, CKMBINDEX, TROPONINI in the last 168 hours.  HbA1C: Hgb A1c MFr Bld  Date/Time Value Ref Range Status  09/20/2019 01:29 AM 6.8 (H) 4.8 - 5.6 % Final    Comment:    (NOTE) Pre diabetes:           5.7%-6.4% Diabetes:              >6.4% Glycemic control for   <7.0% adults with diabetes   08/29/2018 12:09 PM 5.9 4.6 - 6.5 % Final    Comment:    Glycemic Control Guidelines for People with Diabetes:Non Diabetic:  <6%Goal of Therapy: <7%Additional Action Suggested:  >8%     CBG: Recent Labs  Lab 09/21/19 2335 09/22/19 0351 09/22/19 0542 09/22/19 0733 09/22/19 1155  GLUCAP 193* 200* 197* 209* 225*     This patient is critically ill with multiple organ system failure which requires frequent high complexity decision making, assessment, support, evaluation, and titration of therapies. This was completed through the application of advanced monitoring technologies and extensive interpretation of multiple databases. During this encounter critical care time was devoted to patient care services described in this note for 60 minutes.  Julian Hy, DO 09/22/19 2:07 PM [facility]

## 2019-09-22 NOTE — Progress Notes (Signed)
STROKE TEAM PROGRESS NOTE   INTERVAL HISTORY His RN is at bedside. noted.  Patient continues to have mild  respiratory distress requiring sedation  remains on ventilatory support for respiratory failure and on sedation.  TEE has not been done as cardiology refused and limited echo with Definity has ruled out LV thrombus and shown ejection fraction of 35 to 40%..   Vitals:   09/22/19 0902 09/22/19 1000 09/22/19 1100 09/22/19 1124  BP: 96/63 95/68 99/68    Pulse: 93 (!) 110 (!) 108   Resp: (!) 38 (!) 37 (!) 32   Temp:    99.8 F (37.7 C)  TempSrc:    Axillary  SpO2: 93% 93% 94%   Weight:      Height:        CBC:  Recent Labs  Lab 09/19/2019 1545 09/24/2019 1824 09/21/19 0354 09/21/19 0354 09/21/19 1128 09/22/19 0219  WBC 13.5*   < > 13.8*  --   --  15.3*  NEUTROABS 12.5*  --   --   --   --   --   HGB 13.8   < > 11.6*   < > 11.6* 11.6*  HCT 41.9   < > 36.0*   < > 34.0* 35.6*  MCV 92.5   < > 95.2  --   --  94.4  PLT 56*   < > 84*  --   --  90*   < > = values in this interval not displayed.    Basic Metabolic Panel:  Recent Labs  Lab 09/20/19 0922 09/20/19 1650 09/21/19 0354 09/21/19 0354 09/21/19 1128 09/22/19 0219  NA   < >  --  145   < > 144 144  K   < >  --  3.7   < > 4.0 4.3  CL   < >  --  114*  --   --  114*  CO2   < >  --  23  --   --  23  GLUCOSE   < >  --  190*  --   --  207*  BUN   < >  --  46*  --   --  46*  CREATININE   < >  --  1.28*  --   --  1.21  CALCIUM   < >  --  7.2*  --   --  7.1*  MG  --  2.3 2.5*  --   --   --   PHOS  --  2.4* 2.3*  --   --   --    < > = values in this interval not displayed.   Lipid Panel:     Component Value Date/Time   CHOL 134 09/21/2019 0354   TRIG 113 09/21/2019 0354   HDL 21 (L) 09/21/2019 0354   CHOLHDL 6.4 09/21/2019 0354   VLDL 23 09/21/2019 0354   LDLCALC 90 09/21/2019 0354   HgbA1c:  Lab Results  Component Value Date   HGBA1C 6.8 (H) 09/20/2019   Urine Drug Screen:     Component Value Date/Time    LABOPIA NONE DETECTED 10/27/2015 2210   COCAINSCRNUR NONE DETECTED 10/27/2015 2210   LABBENZ NONE DETECTED 10/27/2015 2210   AMPHETMU NONE DETECTED 10/27/2015 2210   THCU NONE DETECTED 10/27/2015 2210   LABBARB NONE DETECTED 10/27/2015 2210    Alcohol Level     Component Value Date/Time   ETH <5 10/27/2015 1855    IMAGING past 48 hours ECHOCARDIOGRAM  LIMITED  Result Date: 09/21/2019   ECHOCARDIOGRAM LIMITED REPORT   Patient Name:   Chris Lewis Date of Exam: 09/21/2019 Medical Rec #:  [id number]     Height:       68.0 in Accession #:    [id number]    Weight:       184.5 lb Date of Birth:  10/20/1940     BSA:          1.97 m Patient Age:    79 years      BP:           120/84 mmHg Patient Gender: M             HR:           90 bpm. Exam Location:  Inpatient  Procedure: Limited Echo, Limited Color Doppler and Cardiac Doppler Indications:    Cardiomegaly 429.3 / I51.7  History:        Patient has prior history of Echocardiogram examinations, most                 recent 09/19/2018. COVID 19. Acute Hypoxemic Respiratory Failure.                 Thrombocytopenia.  Sonographer:    Darlina Sicilian RDCS Referring Phys: [id number] [place] CHRISTOPHER  Sonographer Comments: Echo performed with patient supine and on artificial respirator. IMPRESSIONS  1. Limited echo with definity to exclude LV thrombus  2. Left ventricular ejection fraction, by visual estimation, is 35 to 40%.  3. Global right ventricle has severely reduced systolic function.The right ventricular size is severely enlarged.  4. Right ventricular volume and pressure overload.  5. Tricuspid valve regurgitation moderate.  6. Moderately elevated pulmonary artery systolic pressure.  7. No LV thrombus seen FINDINGS  Left Ventricle: Left ventricular ejection fraction, by visual estimation, is 35 to 40%. The left ventricle demonstrates global hypokinesis. The interventricular septum is flattened in systole and diastole, consistent with right ventricular  pressure and volume overload. Right Ventricle: The right ventricular size is severely enlarged. Global RV systolic function is has severely reduced systolic function. The tricuspid regurgitant velocity is 2.95 m/s, and with an assumed right atrial pressure of 8 mmHg, the estimated right ventricular systolic pressure is moderately elevated at 42.8 mmHg. Pericardium: There is no evidence of pericardial effusion is seen. There is no evidence of pericardial effusion. Tricuspid Valve: Tricuspid valve regurgitation moderate.   LV Volumes (MOD)             Normals LV area d, A2C:    29.40 cm LV area d, A4C:    31.60 cm LV area s, A2C:    21.80 cm LV area s, A4C:    24.00 cm LV major d, A2C:   7.95 cm LV major d, A4C:   7.74 cm LV major s, A2C:   7.30 cm LV major s, A4C:   7.62 cm LV vol d, MOD A2C: 89.6 ml   68 ml LV vol d, MOD A4C: 106.0 ml LV vol s, MOD A2C: 54.7 ml   24 ml LV vol s, MOD A4C: 61.9 ml LV SV MOD A2C:     34.9 ml LV SV MOD A4C:     106.0 ml LV SV MOD BP:      39.3 ml   45 ml TRICUSPID VALVE             Normals TR Peak grad:   34.8 mmHg TR Vmax:  295.00 cm/s 288 cm/s  Oswaldo Milian MD Electronically signed by Oswaldo Milian MD Signature Date/Time: 09/21/2019/12:47:19 PM    Final     PHYSICAL EXAM Frail elderly Caucasian male who is intubated.   He appears to be in respiratory distress. . Afebrile. Head is nontraumatic. Neck is supple without bruit.    Cardiac exam no murmur or gallop. Lungs are clear to auscultation. Distal pulses are well felt. Neurological Exam :  Patient is sedated and intubated.  Eyes are closed.  He does not respond to verbal stimuli.  Eyes are in primary position pupils 3 mm sluggishly reactive.  Corneal reflexes are sluggish t.  Doll's eye movements are sluggish.  Fundi were not visualized.  He does not follow any commands.  There are no spontaneous extremity movements but he does semilocalize to pain more in the left upper and lower extremity than on the  right.  Deep tendon reflexes are symmetric.  Plantars are both equivocal.  ASSESSMENT/PLAN Chris Lewis is a 79 y.o. male with history of COPD, HLD, MVP s/p repair, TIA in 2004, hypothyroidism, depression/anxiety who was was admitted at [facility] for [place] 08/27/2019 thru 09/10/2019, fell while trying to get OOB at home and found altered with progressive confusion, not speaking by family. Concern for aphasia and R sided weakness in the ED.   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 No hemorrhage. Small vessel disease. Atrophy. Sinus dz.   CTA head & neck no ELVO.   CT perfusion normal  MRI  Multiple small B anterior and posterior circulation infarcts. Old R frontal lobe infarct. Mild small vessel disease.  2D Echo RV - severely reduced systolic fxn, severe increase in size. EF 35%. MV repair appears intact.  LE dopplers neg  TEE pending   EEG continuous slowing  LDL 90 mg percent  HgbA1c 6.8  Heparin 5000 units sq tid for VTE prophylaxis  No antithrombotic prior to admission, now on No antithrombotic. 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 Permissive hypertension (OK if < 220/120) but gradually normalize in 5-7 days . Long-term BP goal normotensive  Hyperlipidemia  Home meds:   No statin d/t intolerance  LDL 90, goal < 70  May Consider [facility]-9 at d/c   Suspected Pulmonary Embolus  Heparin IV per pharmacy  CTA Chest - pending  Other Stroke Risk Factors  Advanced age  Former Cigarette smoker, quit 22 yrs ago  Hx stroke/TIA  10/2002 - TIA w/ transient R HH   Family hx stroke (father)  Coronary artery disease  MV stenosis s/p repair  Other Active Problems  AKI / [place]  Hypothyroidism    Anxiety/pain  Hospital day # 4   Patient  presented with altered mental status and encephalopathy of unclear etiology but clearly has multiple small cortical and subcortical infarcts related to Covid hypercoagulability, vasculitis or cardiogenic emboli from myocarditis or depressed cardiac function.  Interestingly he also has aseptic meningitis which appears to be nonbacterial and the time course of his Covid infection is not in favor of Covid meningoencephalitis as he tested positive 4 weeks ago.  His prognosis appears quite poor given multisystem involvement and poor mental status.  Recommend continue supportive care for the next few days to see if there is any meaningful improvement.  TEE has been refused by cardiology and 2D echo with Definity injection shows no evidence of LV mural thrombus... This patient is critically ill and at significant risk of neurological worsening, death and care requires constant monitoring of vital signs, hemodynamics,respiratory and cardiac monitoring, extensive review of multiple databases, frequent neurological assessment, discussion with family, other specialists and medical decision making of high complexity.I have made any additions or clarifications directly to the above note.This critical care time does not reflect procedure time, or teaching time or  supervisory time of PA/NP/Med Resident etc but could involve care discussion time.  I spent 30 minutes of neurocritical care time  in the care of  this patient.    Antony Contras, MD  To contact Stroke Continuity provider, please refer to [email or web address] After hours, contact General Neurology

## 2019-09-22 NOTE — Progress Notes (Signed)
Pharmacy Antibiotic Note  LE CLOUGHERTY is a 79 y.o. male admitted on 09/27/2019 with altered mental status, slight interval worsening of CXR, recent COVID-19 admission. Pt was worked up for meningitis with LP, bacterial meningitis was ruled out. Also concern for endocarditis, unable to do TEE at this time. Pt does have evidence of septic emboli.   2/5 VT 10 at 2022 2/6 VP 34 at 107, dose given 2204 >> AUC 502 - therapeutic, will continue at current dose.  WBC up 15.3, Tmax 101.3  Plan: -Continue Vancomycin 1000mg  IV every 24 hours.  -Continue Ceftriaxone 2 g IV 12h, keep high dose due to septic emboli -If endocarditis is ruled out, continue vancomycin for 7 days   Height: 5\' 8"  (172.7 cm)(Per family) Weight: 193 lb 2 oz (87.6 kg) IBW/kg (Calculated) : 68.4  Temp (24hrs), Avg:100 F (37.8 C), Min:98.6 F (37 C), Max:101.3 F (38.5 C)  Recent Labs  Lab 10/03/2019 1545 09/27/2019 1545 09/26/2019 1831 09/19/19 0036 09/19/19 0624 09/20/19 0922 09/20/19 1110 09/21/19 0354 09/21/19 2022 09/22/19 0000 09/22/19 0219  WBC 13.5*   < >  --  19.3* 26.6* 19.5*  --  13.8*  --   --  15.3*  CREATININE 1.61*   < >  --  1.49* 1.83* 1.60*  --  1.28*  --   --  1.21  LATICACIDVEN 4.6*  --  3.4*  --  2.9*  --  2.7*  --   --   --   --   VANCOTROUGH  --   --   --   --   --   --   --   --  10*  --   --   VANCOPEAK  --   --   --   --   --   --   --   --   --  34  --    < > = values in this interval not displayed.    Estimated Creatinine Clearance: 52.4 mL/min (by C-G formula based on SCr of 1.21 mg/dL).    Allergies  Allergen Reactions  . Duac [Clindamycin Phos-Benzoyl Perox] Rash  . Contrast Media [Iodinated Diagnostic Agents] Rash    Heavy rash and itching 20 yrs ago. Needs full premeds and does well with this.  . Atorvastatin Other (See Comments)    myalgias  . Propoxyphene N-Acetaminophen Itching    rash  . Rosuvastatin Other (See Comments)    myalgias  . Metrizamide Rash    radiopaque  contrast agent. Heavy rash and itching 20 yrs ago. Needs full premeds and does well with this.  Orie Fisherman Vegetable Orange [Psyllium] Other (See Comments)    Heartburn   . Other Rash    High acid food--tomatoes, orange juice  . Tomato Other (See Comments)    Heartburn     Sloan Leiter, PharmD, BCPS, BCCCP Clinical Pharmacist Please refer to [facility] for [place] numbers 09/22/2019 10:27 AM

## 2019-09-22 NOTE — Plan of Care (Signed)
  Problem: Respiratory: Goal: Will maintain a patent airway Outcome: Progressing   Problem: Clinical Measurements: Goal: Respiratory complications will improve Outcome: Progressing   Problem: Nutrition: Goal: Adequate nutrition will be maintained Outcome: Progressing Note: Pt is tolerating tube feeding well.   Problem: Pain Managment: Goal: General experience of comfort will improve Outcome: Progressing   Problem: Activity: Goal: Risk for activity intolerance will decrease Outcome: Not Progressing Note: Pt on bed rest due to critical illness

## 2019-09-22 NOTE — Progress Notes (Addendum)
ANTICOAGULATION CONSULT NOTE - Initial Consult  Pharmacy Consult for Heparin Indication: acute pulmonary embolus 09/22/19  Patient Measurements: Height: 5\' 8"  (172.7 cm)(Per family) Weight: 193 lb 2 oz (87.6 kg) IBW/kg (Calculated) : 68.4 Heparin Dosing Weight: 85 kg  Vital Signs: Temp: 97.7 F (36.5 C) (02/06 1615) Temp Source: Axillary (02/06 1615) BP: 89/59 (02/06 1615) Pulse Rate: 59 (02/06 1615)  Labs: Recent Labs    09/20/19 0922 09/20/19 [id number] 09/21/19 0354 09/21/19 0354 09/21/19 1128 09/22/19 0219 09/22/19 1035 09/22/19 1634  HGB 12.2*   < > 11.6*   < > 11.6* 11.6*  --   --   HCT 37.3*   < > 36.0*  --  34.0* 35.6*  --   --   PLT PLATELET CLUMPS NOTED ON SMEAR, UNABLE TO ESTIMATE  --  84*  --   --  90*  --   --   HEPARINUNFRC  --   --   --   --   --   --   --  <0.10*  CREATININE 1.60*  --  1.28*  --   --  1.21  --   --   TROPONINIHS  --   --   --   --   --   --  267*  --    < > = values in this interval not displayed.    Estimated Creatinine Clearance: 52.4 mL/min (by C-G formula based on SCr of 1.21 mg/dL).  Assessment: 79 year old male with submassive acute  BL PE 09/22/19, moderate clot burden, positive for R heart strain.  2/4 Lower extremity dopplers are negative for DVT, repeat 2/6 pending. TTE negative for LV thrombus. TEE on hold - needs barium study.  CT Head - no hemorrhage. MRI with multiple infarcts, chronic CVA and chronic microhemorrhage.   Platelets are low at 90 - suspect DIC. No overt bleeding noted. Hgb is stable at 11.6.  Creatinine is improving. Received Heparin SQ 5000 units at 5 AM. HL undetectable on 1350 units/hr. Spoke with RN, who stated she held heparin for 10 min for CT around 14:30 and restarted right after. However, upon confirming the infusion at 1800, found that it had been turned off. Unclear how long it's been off but possibly for a few hours.    Spoke with Dr. Carlis Abbott regarding risk benefits of heparin bolus given acute PE and possible  acute CVA hemorrhagic conversion. MD more concerned about acute PE at this time and prefers to give full 4000 unit heparin bolus given undetectable level.   Goal of Therapy:  Heparin level 0.3-0.7 units/ml - Discussed with Dr. Carlis Abbott, will keep higher goal despite strokes due to clinical worsening and high suspicion of PE  Monitor platelets by anticoagulation protocol: Yes   Plan:  Give 4000 unit bolus (45 unit/kg)  Continue Heparin to 1350 units/hr Heparin level in 6 hours  Monitor daily HL, CBC, plt Monitor for signs/symptoms of bleeding    Benetta Spar, PharmD, BCPS, BCCP Clinical Pharmacist  Please check AMION for all [place] phone numbers After 10:00 PM, call [place]

## 2019-09-22 NOTE — Progress Notes (Signed)
Dr. Carlis Abbott made aware of Left IJ CVC withdrawn from insertion site. RN ordered stat chest xray to verify placement. MD looked at xray herself and verified that it was still in a central vein and could be used.

## 2019-09-23 ENCOUNTER — Encounter (HOSPITAL_COMMUNITY): Payer: Medicare Other

## 2019-09-23 DIAGNOSIS — R6521 Severe sepsis with septic shock: Secondary | ICD-10-CM

## 2019-09-23 DIAGNOSIS — I2609 Other pulmonary embolism with acute cor pulmonale: Secondary | ICD-10-CM

## 2019-09-23 DIAGNOSIS — G9341 Metabolic encephalopathy: Secondary | ICD-10-CM

## 2019-09-23 DIAGNOSIS — A419 Sepsis, unspecified organism: Secondary | ICD-10-CM

## 2019-09-23 DIAGNOSIS — A4902 Methicillin resistant Staphylococcus aureus infection, unspecified site: Secondary | ICD-10-CM

## 2019-09-23 LAB — BASIC METABOLIC PANEL
Anion gap: 7 (ref 5–15)
BUN: 52 mg/dL — ABNORMAL HIGH (ref 8–23)
CO2: 26 mmol/L (ref 22–32)
Calcium: 7.4 mg/dL — ABNORMAL LOW (ref 8.9–10.3)
Chloride: 113 mmol/L — ABNORMAL HIGH (ref 98–111)
Creatinine, Ser: 1.25 mg/dL — ABNORMAL HIGH (ref 0.61–1.24)
GFR calc Af Amer: >60 mL/min (ref >60)
GFR calc non Af Amer: 54 mL/min — ABNORMAL LOW (ref >60)
Glucose, Bld: 218 mg/dL — ABNORMAL HIGH (ref 70–99)
Potassium: 4.1 mmol/L (ref 3.5–5.1)
Sodium: 146 mmol/L — ABNORMAL HIGH (ref 135–145)

## 2019-09-23 LAB — HEPARIN LEVEL (UNFRACTIONATED)
Heparin Unfractionated: 0.63 IU/mL (ref 0.30–0.70)
Heparin Unfractionated: 0.58 IU/mL (ref 0.30–0.70)

## 2019-09-23 LAB — GLUCOSE, CAPILLARY
Glucose-Capillary: 126 mg/dL — ABNORMAL HIGH (ref 70–99)
Glucose-Capillary: 167 mg/dL — ABNORMAL HIGH (ref 70–99)
Glucose-Capillary: 179 mg/dL — ABNORMAL HIGH (ref 70–99)
Glucose-Capillary: 185 mg/dL — ABNORMAL HIGH (ref 70–99)
Glucose-Capillary: 211 mg/dL — ABNORMAL HIGH (ref 70–99)
Glucose-Capillary: 221 mg/dL — ABNORMAL HIGH (ref 70–99)

## 2019-09-23 LAB — CBC
HCT: 40 % (ref 39.0–52.0)
Hemoglobin: 12.4 g/dL — ABNORMAL LOW (ref 13.0–17.0)
MCH: 30.5 pg (ref 26.0–34.0)
MCHC: 31 g/dL (ref 30.0–36.0)
MCV: 98.5 fL (ref 80.0–100.0)
Platelets: 116 10*3/uL — ABNORMAL LOW (ref 150–400)
RBC: 4.06 MIL/uL — ABNORMAL LOW (ref 4.22–5.81)
RDW: 17.2 % — ABNORMAL HIGH (ref 11.5–15.5)
WBC: 19.8 10*3/uL — ABNORMAL HIGH (ref 4.0–10.5)
nRBC: 0.4 % — ABNORMAL HIGH (ref 0.0–0.2)

## 2019-09-23 LAB — PHOSPHORUS: Phosphorus: 2.5 mg/dL (ref 2.5–4.6)

## 2019-09-23 LAB — CULTURE, BLOOD (ROUTINE X 2)
Culture: NO GROWTH
Culture: NO GROWTH
Special Requests: ADEQUATE

## 2019-09-23 LAB — CULTURE, RESPIRATORY W GRAM STAIN

## 2019-09-23 LAB — MAGNESIUM: Magnesium: 2.3 mg/dL (ref 1.7–2.4)

## 2019-09-23 MED ORDER — VANCOMYCIN HCL 1250 MG/250ML IV SOLN
1250.0000 mg | INTRAVENOUS | Status: DC
  Administered 2019-09-23 – 2019-09-26 (×4): 1250 mg via INTRAVENOUS
  Filled 2019-09-23 (×6): qty 250

## 2019-09-23 MED ORDER — METOPROLOL TARTRATE 5 MG/5ML IV SOLN
2.5000 mg | INTRAVENOUS | Status: DC | PRN
  Administered 2019-09-23 – 2019-09-28 (×6): 2.5 mg via INTRAVENOUS
  Filled 2019-09-23 (×5): qty 5

## 2019-09-23 MED ORDER — IPRATROPIUM-ALBUTEROL 0.5-2.5 (3) MG/3ML IN SOLN: 3.0000 mL | RESPIRATORY_TRACT | Status: DC | PRN

## 2019-09-23 MED ORDER — METOPROLOL TARTRATE 5 MG/5ML IV SOLN
2.5000 mg | Freq: Once | INTRAVENOUS | Status: AC
  Administered 2019-09-23: 2.5 mg via INTRAVENOUS
  Filled 2019-09-23: qty 5

## 2019-09-23 MED ORDER — METOPROLOL TARTRATE 5 MG/5ML IV SOLN
5.0000 mg | Freq: Four times a day (QID) | INTRAVENOUS | Status: DC
  Administered 2019-09-23 – 2019-09-24 (×2): 5 mg via INTRAVENOUS
  Filled 2019-09-23 (×3): qty 5

## 2019-09-23 MED ORDER — METOPROLOL TARTRATE 5 MG/5ML IV SOLN
2.5000 mg | Freq: Four times a day (QID) | INTRAVENOUS | Status: DC | PRN
  Administered 2019-09-23: 2.5 mg via INTRAVENOUS
  Filled 2019-09-23: qty 5

## 2019-09-23 MED ORDER — PHENYLEPHRINE CONCENTRATED 100MG/250ML (0.4 MG/ML) INFUSION SIMPLE
0.0000 ug/min | INTRAVENOUS | Status: DC
  Administered 2019-09-23: 20 ug/min via INTRAVENOUS
  Filled 2019-09-23: qty 250

## 2019-09-23 MED ORDER — FREE WATER
400.0000 mL | Status: DC
  Administered 2019-09-23 – 2019-09-27 (×24): 400 mL

## 2019-09-23 NOTE — Progress Notes (Signed)
ANTICOAGULATION CONSULT NOTE   Pharmacy Consult for Heparin Indication: acute pulmonary embolus 09/22/19  Patient Measurements: Height: 5\' 8"  (172.7 cm)(Per family) Weight: 193 lb 2 oz (87.6 kg) IBW/kg (Calculated) : 68.4 Heparin Dosing Weight: 85 kg  Vital Signs: Temp: 96.9 F (36.1 C) (02/06 2353) Temp Source: Axillary (02/06 2353) BP: 100/63 (02/07 0200) Pulse Rate: 84 (02/07 0200)  Labs: Recent Labs    09/20/19 0922 09/20/19 [id number] 09/21/19 0354 09/21/19 0354 09/21/19 1128 09/22/19 0219 09/22/19 1035 09/22/19 1634 09/23/19 0123  HGB 12.2*   < > 11.6*   < > 11.6* 11.6*  --   --   --   HCT 37.3*   < > 36.0*  --  34.0* 35.6*  --   --   --   PLT PLATELET CLUMPS NOTED ON SMEAR, UNABLE TO ESTIMATE  --  84*  --   --  90*  --   --   --   HEPARINUNFRC  --   --   --   --   --   --   --  <0.10* 0.63  CREATININE 1.60*  --  1.28*  --   --  1.21  --   --   --   TROPONINIHS  --   --   --   --   --   --  267*  --   --    < > = values in this interval not displayed.    Estimated Creatinine Clearance: 52.4 mL/min (by C-G formula based on SCr of 1.21 mg/dL).  Assessment: 79 year old male with submassive acute  BL PE 09/22/19, moderate clot burden, positive for R heart strain.  2/4 Lower extremity dopplers are negative for DVT, repeat 2/6 pending. TTE negative for LV thrombus. TEE on hold - needs barium study.  CT Head - no hemorrhage. MRI with multiple infarcts, chronic CVA and chronic microhemorrhage.   Platelets are low at 90 - suspect DIC. No overt bleeding noted. Hgb is stable at 11.6.  Creatinine is improving. Received Heparin SQ 5000 units at 5 AM. HL undetectable on 1350 units/hr. Spoke with RN, who stated she held heparin for 10 min for CT around 14:30 and restarted right after. However, upon confirming the infusion at 1800, found that it had been turned off. Unclear how long it's been off but possibly for a few hours.    Spoke with Dr. Carlis Abbott regarding risk benefits of heparin bolus  given acute PE and possible acute CVA hemorrhagic conversion. MD more concerned about acute PE at this time and prefers to give full 4000 unit heparin bolus given undetectable level.   Heparin level 0.63 (therapeutic) -noted aim to keep ~0.5 if possible.  Goal of Therapy:  Heparin level 0.3-0.7 units/ml - Discussed with Dr. Carlis Abbott, will keep higher goal despite strokes due to clinical worsening and high suspicion of PE - try to keep ~0.5 Monitor platelets by anticoagulation protocol: Yes   Plan:  Decrease heparin just slightly to 1300 units/hr Will f/u 8hr heparin level  Sherlon Handing, PharmD, BCPS Please see amion for complete clinical pharmacist phone list 09/23/2019 2:10 AM

## 2019-09-23 NOTE — Progress Notes (Signed)
eLink Physician-Brief Progress Note Patient Name: Chris Lewis DOB: Feb 10, 1941 MRN: [id number]   Date of Service  09/23/2019  HPI/Events of Note  Recurrence of Aflutter with faster conduction (HR 160).  eICU Interventions  Ordered metoprolol 2.5mg  Q6H PRN SVT with HR > 150.     Intervention Category Major Interventions: Arrhythmia - evaluation and management  Marily Lente Jamez Ambrocio 09/23/2019, 3:34 AM

## 2019-09-23 NOTE — Progress Notes (Signed)
Patient HR has increased again.  Now in the 150s.  Will not be giving Neb treatment due to increased HR.  Will continue to monitor.

## 2019-09-23 NOTE — Progress Notes (Signed)
STROKE TEAM PROGRESS NOTE   INTERVAL HISTORY His RN is at bedside. .  Patient is neurologically better today and can be aroused and open eyes and follows midline commands as well as moves all 4 extremities purposefully but moves right side less than left.  He continues to have mild  respiratory distress requiring sedation  remains on ventilatory support for respiratory failure and on sedation.  Vitals:   09/23/19 0645 09/23/19 0700 09/23/19 0800 09/23/19 0822  BP: 101/71 92/65 105/77 (!) 89/75  Pulse: 80 81 (!) 150 (!) 154  Resp: 16 17 16  (!) 22  Temp:   97.9 F (36.6 C)   TempSrc:   Oral   SpO2: 96% 95% 96% 95%  Weight:      Height:        CBC:  Recent Labs  Lab 09/17/2019 1545 09/20/2019 1824 09/22/19 0219 09/23/19 0506  WBC 13.5*   < > 15.3* 19.8*  NEUTROABS 12.5*  --   --   --   HGB 13.8   < > 11.6* 12.4*  HCT 41.9   < > 35.6* 40.0  MCV 92.5   < > 94.4 98.5  PLT 56*   < > 90* 116*   < > = values in this interval not displayed.    Basic Metabolic Panel:  Recent Labs  Lab 09/21/19 0354 09/21/19 1128 09/22/19 0219 09/23/19 0506  NA 145   < > 144 146*  K 3.7   < > 4.3 4.1  CL 114*  --  114* 113*  CO2 23  --  23 26  GLUCOSE 190*  --  207* 218*  BUN 46*  --  46* 52*  CREATININE 1.28*  --  1.21 1.25*  CALCIUM 7.2*  --  7.1* 7.4*  MG 2.5*  --   --  2.3  PHOS 2.3*  --   --  2.5   < > = values in this interval not displayed.   Lipid Panel:     Component Value Date/Time   CHOL 134 09/21/2019 0354   TRIG 113 09/21/2019 0354   HDL 21 (L) 09/21/2019 0354   CHOLHDL 6.4 09/21/2019 0354   VLDL 23 09/21/2019 0354   LDLCALC 90 09/21/2019 0354   HgbA1c:  Lab Results  Component Value Date   HGBA1C 6.8 (H) 09/20/2019   Urine Drug Screen:     Component Value Date/Time   LABOPIA NONE DETECTED 10/27/2015 2210   COCAINSCRNUR NONE DETECTED 10/27/2015 2210   LABBENZ NONE DETECTED 10/27/2015 2210   AMPHETMU NONE DETECTED 10/27/2015 2210   THCU NONE DETECTED 10/27/2015  2210   LABBARB NONE DETECTED 10/27/2015 2210    Alcohol Level     Component Value Date/Time   ETH <5 10/27/2015 1855    IMAGING past 48 hours   CT ANGIO CHEST PE W OR WO CONTRAST 09/22/2019  IMPRESSION:  1. Acute BILATERAL pulmonary emboli. Overall clot burden is moderate.  2. CT evidence of RIGHT heart strain (RV/LV Ratio = 1.3) indicating at least submassive (intermediate risk) PE. The presence of right heart strain has been associated with an increased risk of morbidity and mortality.  3. Residual scattered peripheral airspace opacities throughout both lungs since the 08/28/2019 CT, indicating persistent pneumonia, with interval improvement.  4. Very small BILATERAL pleural effusions.  5. Aortic Atherosclerosis (ICD10-I70.0)  6. Emphysema (ICD10-J43.9).    DG CHEST PORT 1 VIEW 09/22/2019 IMPRESSION:  1. Interval left internal jugular venous catheter replacement versus repositioning when compared to  the prior chest plain film dated September 19, 2019.  2. Resolution of the small left apical pneumothorax seen on the prior study.  3. Stable bibasilar infiltrates.    PHYSICAL EXAM Frail elderly Caucasian male who is intubated.   He appears to be in respiratory distress. . Afebrile. Head is nontraumatic. Neck is supple without bruit.    Cardiac exam no murmur or gallop. Lungs are clear to auscultation. Distal pulses are well felt. Neurological Exam :  Patient is sedated and intubated.  Eyes are closed.  He he can awaken when aroused and follows gaze in the vertical direction and can look to the left but is unable to look to the right.  He can stick out his tongue to command.  He moves all 4 extremities purposefully but moves left arm and leg more than right side.  .  Deep tendon reflexes are symmetric.  Plantars are both equivocal.  ASSESSMENT/PLAN Chris Lewis is a 79 y.o. male with history of COPD, HLD, MVP s/p repair, TIA in 2004, hypothyroidism, depression/anxiety who was was  admitted at [facility] for [place] 08/27/2019 thru 09/10/2019, fell while trying to get OOB at home and found altered with progressive confusion, not speaking by family. Concern for aphasia and R sided weakness in the ED.   Stroke:   B anterior and posterior circulation infarcts embolic secondary to unclear source - differential includes: hypercoagulaility from COVID infection, Covid vasculitis, endocarditis from sepsis, cardiogenic emboli from cardiac dysfunction and clot or Covid myocarditis  CT head age indeterminate R frontal lobe infarct. No hemorrhage. Small vessel disease. Atrophy. Sinus dz.   CTA head & neck no ELVO.   CT perfusion normal  MRI  Multiple small B anterior and posterior circulation infarcts. Old R frontal lobe infarct. Mild small vessel disease.  2D Echo RV - severely reduced systolic fxn, severe increase in size. EF 35%. MV repair appears intact.  LE dopplers neg  TEE pending   EEG continuous slowing  LDL 90 mg percent  HgbA1c 6.8  Heparin 5000 units sq tid for VTE prophylaxis  No antithrombotic prior to admission, now on No antithrombotic. 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 Permissive hypertension (OK if < 220/120) but gradually normalize in  5-7 days . Long-term BP goal normotensive  Hyperlipidemia  Home meds:   No statin d/t intolerance  LDL 90, goal < 70  May Consider PCSK-9 at d/c   Pulmonary Embolus  CTA Chest -  Acute BILATERAL pulmonary emboli. Overall clot burden is moderate.   Heparin IV per pharmacy  Other Stroke Risk Factors  Advanced age  Former Cigarette smoker, quit 22 yrs ago  Hx stroke/TIA  10/2002 - TIA w/ transient R HH   Family hx stroke (father)  Coronary artery disease  MV stenosis s/p repair  Other Active Problems  AKI / NGMA - creatinine - 1.28->1.21->1.25  Hypothyroidism    Anxiety/pain  Aortic Atherosclerosis (ICD10-I70.0)   Emphysema (ICD10-J43.9).   Code Status - limited code  Hospital day # 5   Patient presented with altered mental status and encephalopathy of unclear etiology but clearly has multiple small cortical and subcortical infarcts related to Covid hypercoagulability, vasculitis or cardiogenic emboli from myocarditis or depressed cardiac function.  Interestingly he also has aseptic meningitis which appears to be nonbacterial and the time course of his Covid infection is not in favor of Covid meningoencephalitis as he tested positive 4 weeks ago.  His prognosis appears quite poor given multisystem involvement and poor mental status.  Recommend continue ongoing supportive as he seems to be showing some neurological improvement...  Discussed with Dr. Noemi Chapel critical care MD This patient is critically ill and at significant risk of neurological worsening, death and care requires constant monitoring of vital signs, hemodynamics,respiratory and cardiac monitoring, extensive review of multiple databases, frequent neurological assessment, discussion with family, other specialists and medical decision making of high complexity.I have made any additions or clarifications directly to the above note.This critical care time does not reflect procedure time, or teaching time or  supervisory time of PA/NP/Med Resident etc but could involve care discussion time.  I spent 30 minutes of neurocritical care time  in the care of  this patient.   Antony Contras, MD  To contact Stroke Continuity provider, please refer to [email or web address] After hours, contact General Neurology

## 2019-09-23 NOTE — Progress Notes (Addendum)
ANTICOAGULATION & ANTIBIOTIC CONSULT NOTE   Pharmacy Consult for Heparin and Vancomycin Indication: acute pulmonary embolus 09/22/19  Patient Measurements: Height: 5\' 8"  (172.7 cm)(Per family) Weight: 190 lb 4.1 oz (86.3 kg) IBW/kg (Calculated) : 68.4 Heparin Dosing Weight: 85 kg  Vital Signs: Temp: 97.9 F (36.6 C) (02/07 0800) Temp Source: Oral (02/07 0800) BP: 89/75 (02/07 0822) Pulse Rate: 154 (02/07 0822)  Labs: Recent Labs    09/21/19 0354 09/21/19 0354 09/21/19 1128 09/21/19 1128 09/22/19 0219 09/22/19 1035 09/22/19 1634 09/23/19 0123 09/23/19 0506 09/23/19 1039  HGB 11.6*   < > 11.6*   < > 11.6*  --   --   --  12.4*  --   HCT 36.0*   < > 34.0*  --  35.6*  --   --   --  40.0  --   PLT 84*  --   --   --  90*  --   --   --  116*  --   HEPARINUNFRC  --   --   --   --   --   --  <0.10* 0.63  --  0.58  CREATININE 1.28*  --   --   --  1.21  --   --   --  1.25*  --   TROPONINIHS  --   --   --   --   --  267*  --   --   --   --    < > = values in this interval not displayed.    Estimated Creatinine Clearance: 50.4 mL/min (A) (by C-G formula based on SCr of 1.25 mg/dL (H)).  Assessment: 79 year old male with submassive acute  BL PE 09/22/19, moderate clot burden, positive for R heart strain.  2/4 Lower extremity dopplers are negative for DVT, repeat 2/6 pending. TTE negative for LV thrombus. TEE on hold - needs barium study.  CT Head - no hemorrhage. MRI with multiple infarcts, chronic CVA and chronic microhemorrhage.   Heparin level 0.58 (therapeutic) - aim to keep ~0.5 if possible, no change. Hgb stable at 12.4, Plts are rising at 116.   Vancomycin for MRSA UTI and staph in resp culture. SCr stable. MD would like to increase trough and push AUC to upper end of goal (currently 502) so will increase Vancomycin and monitor renal function closely.   Goal of Therapy:  Heparin level 0.3-0.7 units/ml - Discussed with Dr. Carlis Abbott, will keep higher goal despite strokes due to  clinical worsening and high suspicion of PE - try to keep ~0.5 Monitor platelets by anticoagulation protocol: Yes   Plan:  Continue heparin at 1300 units/hr Daily heparin level and CBC Monitor for any signs or symptoms of bleeding  Increase Vancomycin to 1250mg  IV every 24 hours.  Monitor renal function, culture results, and clinical status.   Sloan Leiter, PharmD, BCPS, BCCCP Clinical Pharmacist Please refer to [facility] for [place] numbers 09/23/2019 11:31 AM

## 2019-09-23 NOTE — Progress Notes (Signed)
Assisted tele visit to patient with family member.  Zorina Mallin M, RN  

## 2019-09-23 NOTE — Progress Notes (Signed)
NAME:  Chris Lewis, MRN:  [id number], DOB:  03-30-1941, LOS: 5 ADMISSION DATE:  09/19/2019, CONSULTATION DATE:  09/17/2019 REFERRING MD:  Alvino Chapel, CHIEF COMPLAINT:  AMS, fall  Brief History   79 year old man with a history of COPD (emphysema seen on CT scan), Achalasia, anxiety/depression, HLD, hypothyroidism, RBBB, MR s/p MV repair, Recently hospitalized at [facility] for covid PNA (1/12-1/25) now here with AMS. Per wife he fell overnight while trying to get out of bed on his own.  AM of admit was found altered and not speaking by his family.    Past Medical History  Achalasia, anxiety/depression, HLD, hypothyroidism, RBBB, MR s/p MV repair, Recently hospitalized at [facility] for covid PNA (1/12-1/25)   Significant Hospital Events   2/02 Admit   Consults:  PCCM  Neurology   Procedures:  ETT 2/2 >>  CVC (backed out but still in innominate vein on CXR)   Significant Diagnostic Tests:  CT Head 2/2 >> motion degraded study, age-indeterminate infarct involving the R frontal lobe CTA Head / Neck / Perfusion 2/2 >> no emergent large vessel occlusion, normal cerebral CT perfusion Echo 2/5-dilated RV with reduced systolic function and evidence of overload, LVEF 35 to 40% with global hypokinesis  Micro Data:  MRSA PCR 2/3 >> positive  UC 2/2 >> MRSA 100k CSF 2/4 >> NG final BCx2 2/2 >> NG final  resp cx 2/5>> MRSA (R erythromycin), few candida albicans  Antimicrobials:   Anti-infectives (From admission, onward)   Start     Dose/Rate Route Frequency Ordered Stop   09/23/19 2200  vancomycin (VANCOREADY) IVPB 1250 mg/250 mL     1,250 mg 166.7 mL/hr over 90 Minutes Intravenous Every 24 hours 09/23/19 0807     09/21/19 2000  cefTRIAXone (ROCEPHIN) 2 g in sodium chloride 0.9 % 100 mL IVPB  Status:  Discontinued     2 g 200 mL/hr over 30 Minutes Intravenous Every 12 hours 09/21/19 1137 09/23/19 0808   09/19/19 1000  ceFEPIme (MAXIPIME) 2 g in sodium chloride 0.9 % 100 mL IVPB  Status:  Discontinued      2 g 200 mL/hr over 30 Minutes Intravenous Every 12 hours 09/19/19 0113 09/21/19 1137   09/19/19 0200  metroNIDAZOLE (FLAGYL) IVPB 500 mg  Status:  Discontinued     500 mg 100 mL/hr over 60 Minutes Intravenous Every 8 hours 09/19/19 0113 09/21/19 1137   09/19/19 0130  ampicillin (OMNIPEN) 2 g in sodium chloride 0.9 % 100 mL IVPB  Status:  Discontinued     2 g 300 mL/hr over 20 Minutes Intravenous Every 6 hours 09/19/19 0113 09/21/19 1137   09/19/19 0015  piperacillin-tazobactam (ZOSYN) IVPB 3.375 g  Status:  Discontinued     3.375 g 12.5 mL/hr over 240 Minutes Intravenous Every 8 hours 09/19/19 0001 09/19/19 0115   09/25/2019 2315  vancomycin (VANCOCIN) IVPB 1000 mg/200 mL premix  Status:  Discontinued     1,000 mg 200 mL/hr over 60 Minutes Intravenous Every 24 hours 09/17/2019 2303 09/23/19 0807   09/29/2019 2200  ceFEPIme (MAXIPIME) 2 g in sodium chloride 0.9 % 100 mL IVPB  Status:  Discontinued     2 g 200 mL/hr over 30 Minutes Intravenous Every 12 hours 09/24/2019 1656 09/19/19 0001   09/27/2019 1600  cefTRIAXone (ROCEPHIN) 1 g in sodium chloride 0.9 % 100 mL IVPB     1 g 200 mL/hr over 30 Minutes Intravenous  Once 10/11/2019 1548 09/25/2019 1639  Interim history/subjective:  A-flutter with RVR overnight, off levophed but now on neosynephrine. Digoxin-loaded and metoprolol given overnight. Recurrent RVR this morning only transiently responsive to metoprolol.  Objective   Blood pressure 111/82, pulse 78, temperature (!) 97.2 F (36.2 C), temperature source Axillary, resp. rate 18, height 5\' 8"  (1.727 m), weight 86.3 kg, SpO2 95 %.    Vent Mode: PRVC FiO2 (%):  [40 %-50 %] 40 % Set Rate:  [20 bmp] 20 bmp Vt Set:  [540 mL] 540 mL PEEP:  [5 cmH20] 5 cmH20 Plateau Pressure:  [8 cmH20-18 cmH20] 17 cmH20   Intake/Output Summary (Last 24 hours) at 09/23/2019 1450 Last data filed at 09/23/2019 1200 Gross per 24 hour  Intake 2071.35 ml  Output 3360 ml  Net -1288.65 ml   Filed Weights    09/21/19 0411 09/22/19 0500 09/23/19 0500  Weight: 83.7 kg 87.6 kg 86.3 kg    Examination: GEN: critically ill appearing, intubated, minimally sedated HEENT: [place]/AT, eyes anicteric, less scleral edema. Upward gaze preference. CV: tachycardic, irreg rhythm, no murmur PULM: breathing comfortably on the vent, no wheezing or rhales. GI: soft, NT, ND EXT: mild b/l UE edema with bruising, no LE edema. Palpable but weak LE pulses NEURO: RASS 0, nodding to answer y/n questions, but not tracking. Moving all extremities on command, but globally weak. SKIN: pallor in LE  Platelets improving 116 WBC 19.8, uptrending Na + 146, uptrending  Resolved Hospital Problem list     Assessment & Plan:   Acute Metabolic Encephalopathy: multifactorial, out of proportion to stroke alone, mild meningitic picture by LP.  Neurology following. Improved today. -con't low sedation as required, but doing well  Acute Hypoxemic Respiratory Failure- MRSA pneumonia. Bilateral submassive vs massive PE- provoked with covid. Hx of recent COVID pneumonia, COPD, severe emphysema on CT imaging from prior admission.  Recent admission for covid PNA 1/12-1/25, discharged on 2L O2.  Decreasing O2 requirements. -Con't LTVV 4-8cc/kg IBW with goal Pplat <30 and driving pressure [id number] -daily SAT & SBT when able; hemodynamic instability precludes weaning today -VAP prevention protocol -holding additional lasix for now given tachycardia and decreasing oxygen requirements. 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 TTE no vegetations, but unable to get TEE 2/2 achalasia. Presumed endocarditis or bacteremia related to both infections. LVEF 35%, IVC not collapsing on [place]  -Con't vancomycin- discussed dosing with  pharmacy -Vasopressors as required to maintain MAP greater than 65-- switched to neosynephrine & vasopressin to minimize catecholamines -full AC for PE -Continue stress dose steroids -consult ID tomorrow for additional recommendations  NSTEMI Acute BiVentricular Failure / COVID Myocarditis  Aflutter with RVR RBBB old.  Trop elevated but trending down.  -Con't tele -optimize electrolytes  Thrombocytopenia; increases risk of bleeding due to anticoagulation. Improving -con't to monitor  AKI  - likely ATN, improved/ stable NGMA  -daily BMP -strict I/O -renally dose meds, avoid nephrotoxic meds  Hypothyroidism  TSH 2.7 on admit  -Continue PTA Synthroid  Hyperglycemia- improving control -Con't basal bolus insulin. Lantus increased to 10 units daily -SSI Q4h PRN  Anxiety/pain  -fentanyl PRN to tolerate MV  Severe esophageal stenosis, history of achalasia -Unable to have TEE performed   Best practice:  Diet: npo Pain/Anxiety/Delirium protocol (if indicated): PAD protocol  VAP protocol (if indicated): in place DVT prophylaxis:  heparin GI prophylaxis: protonix  Glucose control: SSI Mobility: BR Code Status: wife Jone updated via phone Disposition: ICU  Labs   CBC: Recent Labs  Lab 09/24/2019 1545 10/09/2019 1824 09/19/19 [id number] 09/19/19 [id number] 09/19/19 0800 09/20/19 0922 09/21/19 0354 09/21/19 1128 09/22/19 0219 09/23/19 0506  WBC 13.5*   < > 26.6*  --   --  19.5* 13.8*  --  15.3* 19.8*  NEUTROABS 12.5*  --   --   --   --   --   --   --   --   --   HGB 13.8   < > 12.4*   < >  --  12.2* 11.6* 11.6* 11.6* 12.4*  HCT 41.9   < > 38.7*   < >  --  37.3* 36.0* 34.0* 35.6* 40.0  MCV 92.5   < > 94.4  --   --  94.0 95.2  --  94.4 98.5  PLT 56*   < > 120*   < > 120* PLATELET CLUMPS NOTED ON SMEAR, UNABLE TO ESTIMATE 84*  --  90* 116*   < > = values in this interval not displayed.    Basic Metabolic Panel: Recent Labs  Lab 09/19/19 0624 09/19/19 0624 09/19/19 1844  09/20/19 0129 09/20/19 0922 09/20/19 1650 09/21/19 0354 09/21/19 1128 09/22/19 0219 09/23/19 0506  NA 137   < >  --   --  140  --  145 144 144 146*  K 4.7   < >  --   --  3.7  --  3.7 4.0 4.3 4.1  CL 103  --   --   --  110  --  114*  --  114* 113*  CO2 20*  --   --   --  20*  --  23  --  23 26  GLUCOSE 172*  --   --   --  258*  --  190*  --  207* 218*  BUN 30*  --   --   --  48*  --  46*  --  46* 52*  CREATININE 1.83*  --   --   --  1.60*  --  1.28*  --  1.21 1.25*  CALCIUM 7.1*  --   --   --  7.2*  --  7.2*  --  7.1* 7.4*  MG  --   --  1.9 1.9  --  2.3 2.5*  --   --  2.3  PHOS  --   --  4.6 3.7  --  2.4* 2.3*  --   --  2.5   < > = values in this interval not displayed.   GFR: Estimated Creatinine Clearance: 50.4 mL/min (A) (by C-G formula based on SCr of 1.25 mg/dL (H)). Recent Labs  Lab 09/23/2019 1545 10/13/2019 1831 09/19/19 0036 09/19/19 [id number] 09/19/19 [id number] 09/20/19 0922 09/20/19 1110 09/21/19 0354 09/22/19 0219 09/23/19 0506  PROCALCITON  --  3.36  --   --   --   --   --   --   --   --   WBC 13.5*  --    < > 26.6*   < > 19.5*  --  13.8* 15.3* 19.8*  LATICACIDVEN 4.6* 3.4*  --  2.9*  --   --  2.7*  --   --   --    < > = values in this interval not displayed.    Liver Function Tests: Recent Labs  Lab 09/27/2019 1545  09/19/19 0624 09/21/19 0354  AST 55* 54* 21  ALT 37 39 28  ALKPHOS 87 120 78  BILITOT 2.0* 0.8 0.8  PROT 5.5* 4.8* 4.8*  ALBUMIN 2.7* 2.0* 1.8*   No results for input(s): LIPASE, AMYLASE in the last 168 hours. No results for input(s): AMMONIA in the last 168 hours.  ABG    Component Value Date/Time   PHART 7.363 09/21/2019 1128   PCO2ART 36.6 09/21/2019 1128   PO2ART 75.0 (L) 09/21/2019 1128   HCO3 20.7 09/21/2019 1128   TCO2 22 09/21/2019 1128   ACIDBASEDEF 4.0 (H) 09/21/2019 1128   O2SAT 94.0 09/21/2019 1128     Coagulation Profile: Recent Labs  Lab 09/30/2019 1831 09/19/19 0800  INR 1.3* 1.3*    Cardiac Enzymes: No results for  input(s): CKTOTAL, CKMB, CKMBINDEX, TROPONINI in the last 168 hours.  HbA1C: Hgb A1c MFr Bld  Date/Time Value Ref Range Status  09/20/2019 01:29 AM 6.8 (H) 4.8 - 5.6 % Final    Comment:    (NOTE) Pre diabetes:          5.7%-6.4% Diabetes:              >6.4% Glycemic control for   <7.0% adults with diabetes   08/29/2018 12:09 PM 5.9 4.6 - 6.5 % Final    Comment:    Glycemic Control Guidelines for People with Diabetes:Non Diabetic:  <6%Goal of Therapy: <7%Additional Action Suggested:  >8%     CBG: Recent Labs  Lab 09/22/19 2004 09/22/19 2351 09/23/19 0356 09/23/19 0819 09/23/19 1148  GLUCAP 234* 231* 221* 167* 179*     This patient is critically ill with multiple organ system failure which requires frequent high complexity decision making, assessment, support, evaluation, and titration of therapies. This was completed through the application of advanced monitoring technologies and extensive interpretation of multiple databases. During this encounter critical care time was devoted to patient care services described in this note for 55 minutes.  Julian Hy, DO 09/23/19 3:15 PM [facility]

## 2019-09-23 NOTE — Progress Notes (Signed)
[facility] Progress Note Patient Name: Chris Lewis DOB: 07-23-1941 MRN: [id number]   Date of Service  09/23/2019  HPI/Events of Note  Follow up of narrow complex tachycardia.  After 2.5mg  metoprolol (and the digoxin load given earlier) the patient's HR is now 85. Rhythm is now apparent: it is atrial flutter with 4:1 conduction.  BP is stable at 107/66.   eICU Interventions  No further action at this time. Continue digoxin load (0.25mg  IV doses). If recurrent RVR will add standing dose of metoprolol.     Intervention Category Intermediate Interventions: Arrhythmia - evaluation and management  Marily Lente Audrey Eller 09/23/2019, 1:36 AM

## 2019-09-23 NOTE — Progress Notes (Signed)
[facility] Physician-Brief Progress Note Patient Name: Chris Lewis DOB: 1941/06/29 MRN: [id number]   Date of Service  09/23/2019  HPI/Events of Note  HR still 160s (unchanged), narrow complex. Received the 0.5mg  IV load of digoxin without observable effect. Is now off levophed too with MAP just maintained at 65.   eICU Interventions  Will have phenylephrine primed and ready to infuse at bedside, and order 2.5mg  IV metoprolol x1 dose.     Intervention Category Major Interventions: Arrhythmia - evaluation and management  Marily Lente [place] 09/23/2019, 12:04 AM

## 2019-09-24 ENCOUNTER — Inpatient Hospital Stay (HOSPITAL_COMMUNITY): Payer: BC Managed Care – PPO

## 2019-09-24 DIAGNOSIS — J984 Other disorders of lung: Secondary | ICD-10-CM | POA: Diagnosis not present

## 2019-09-24 DIAGNOSIS — Z881 Allergy status to other antibiotic agents status: Secondary | ICD-10-CM

## 2019-09-24 DIAGNOSIS — I5189 Other ill-defined heart diseases: Secondary | ICD-10-CM | POA: Diagnosis not present

## 2019-09-24 DIAGNOSIS — J9601 Acute respiratory failure with hypoxia: Secondary | ICD-10-CM

## 2019-09-24 DIAGNOSIS — Z91041 Radiographic dye allergy status: Secondary | ICD-10-CM

## 2019-09-24 DIAGNOSIS — Z8616 Personal history of COVID-19: Secondary | ICD-10-CM

## 2019-09-24 DIAGNOSIS — Z8701 Personal history of pneumonia (recurrent): Secondary | ICD-10-CM

## 2019-09-24 DIAGNOSIS — U071 COVID-19: Secondary | ICD-10-CM

## 2019-09-24 DIAGNOSIS — I255 Ischemic cardiomyopathy: Secondary | ICD-10-CM

## 2019-09-24 DIAGNOSIS — B9561 Methicillin susceptible Staphylococcus aureus infection as the cause of diseases classified elsewhere: Secondary | ICD-10-CM | POA: Diagnosis not present

## 2019-09-24 DIAGNOSIS — J9691 Respiratory failure, unspecified with hypoxia: Secondary | ICD-10-CM

## 2019-09-24 DIAGNOSIS — J189 Pneumonia, unspecified organism: Secondary | ICD-10-CM | POA: Diagnosis not present

## 2019-09-24 DIAGNOSIS — I340 Nonrheumatic mitral (valve) insufficiency: Secondary | ICD-10-CM

## 2019-09-24 DIAGNOSIS — I2699 Other pulmonary embolism without acute cor pulmonale: Secondary | ICD-10-CM

## 2019-09-24 DIAGNOSIS — R579 Shock, unspecified: Secondary | ICD-10-CM | POA: Diagnosis not present

## 2019-09-24 DIAGNOSIS — A4102 Sepsis due to Methicillin resistant Staphylococcus aureus: Secondary | ICD-10-CM | POA: Diagnosis not present

## 2019-09-24 DIAGNOSIS — Z888 Allergy status to other drugs, medicaments and biological substances status: Secondary | ICD-10-CM

## 2019-09-24 DIAGNOSIS — I4892 Unspecified atrial flutter: Secondary | ICD-10-CM

## 2019-09-24 DIAGNOSIS — A419 Sepsis, unspecified organism: Secondary | ICD-10-CM | POA: Diagnosis not present

## 2019-09-24 DIAGNOSIS — Z87891 Personal history of nicotine dependence: Secondary | ICD-10-CM

## 2019-09-24 DIAGNOSIS — I2609 Other pulmonary embolism with acute cor pulmonale: Secondary | ICD-10-CM | POA: Diagnosis not present

## 2019-09-24 DIAGNOSIS — R739 Hyperglycemia, unspecified: Secondary | ICD-10-CM

## 2019-09-24 DIAGNOSIS — Z452 Encounter for adjustment and management of vascular access device: Secondary | ICD-10-CM | POA: Diagnosis not present

## 2019-09-24 DIAGNOSIS — I330 Acute and subacute infective endocarditis: Secondary | ICD-10-CM | POA: Diagnosis not present

## 2019-09-24 DIAGNOSIS — A4902 Methicillin resistant Staphylococcus aureus infection, unspecified site: Secondary | ICD-10-CM | POA: Diagnosis not present

## 2019-09-24 DIAGNOSIS — Z9911 Dependence on respirator [ventilator] status: Secondary | ICD-10-CM

## 2019-09-24 DIAGNOSIS — Z91018 Allergy to other foods: Secondary | ICD-10-CM

## 2019-09-24 DIAGNOSIS — I76 Septic arterial embolism: Secondary | ICD-10-CM

## 2019-09-24 DIAGNOSIS — I4510 Unspecified right bundle-branch block: Secondary | ICD-10-CM

## 2019-09-24 DIAGNOSIS — I4891 Unspecified atrial fibrillation: Secondary | ICD-10-CM

## 2019-09-24 DIAGNOSIS — R209 Unspecified disturbances of skin sensation: Secondary | ICD-10-CM | POA: Diagnosis not present

## 2019-09-24 DIAGNOSIS — J9600 Acute respiratory failure, unspecified whether with hypoxia or hypercapnia: Secondary | ICD-10-CM

## 2019-09-24 DIAGNOSIS — I6310 Cerebral infarction due to embolism of unspecified precerebral artery: Secondary | ICD-10-CM | POA: Diagnosis not present

## 2019-09-24 DIAGNOSIS — I214 Non-ST elevation (NSTEMI) myocardial infarction: Secondary | ICD-10-CM | POA: Diagnosis not present

## 2019-09-24 DIAGNOSIS — G9341 Metabolic encephalopathy: Secondary | ICD-10-CM | POA: Diagnosis not present

## 2019-09-24 DIAGNOSIS — I669 Occlusion and stenosis of unspecified cerebral artery: Secondary | ICD-10-CM

## 2019-09-24 DIAGNOSIS — R509 Fever, unspecified: Secondary | ICD-10-CM | POA: Diagnosis not present

## 2019-09-24 DIAGNOSIS — I6340 Cerebral infarction due to embolism of unspecified cerebral artery: Secondary | ICD-10-CM | POA: Diagnosis not present

## 2019-09-24 DIAGNOSIS — R7881 Bacteremia: Secondary | ICD-10-CM | POA: Diagnosis not present

## 2019-09-24 DIAGNOSIS — J449 Chronic obstructive pulmonary disease, unspecified: Secondary | ICD-10-CM

## 2019-09-24 LAB — GLUCOSE, CAPILLARY
Glucose-Capillary: 178 mg/dL — ABNORMAL HIGH (ref 70–99)
Glucose-Capillary: 243 mg/dL — ABNORMAL HIGH (ref 70–99)
Glucose-Capillary: 203 mg/dL — ABNORMAL HIGH (ref 70–99)
Glucose-Capillary: 185 mg/dL — ABNORMAL HIGH (ref 70–99)
Glucose-Capillary: 144 mg/dL — ABNORMAL HIGH (ref 70–99)
Glucose-Capillary: 168 mg/dL — ABNORMAL HIGH (ref 70–99)

## 2019-09-24 LAB — MAGNESIUM
Magnesium: 2.3 mg/dL (ref 1.7–2.4)
Magnesium: 2.2 mg/dL (ref 1.7–2.4)

## 2019-09-24 LAB — BASIC METABOLIC PANEL
Anion gap: 9 (ref 5–15)
BUN: 55 mg/dL — ABNORMAL HIGH (ref 8–23)
CO2: 25 mmol/L (ref 22–32)
Calcium: 7.1 mg/dL — ABNORMAL LOW (ref 8.9–10.3)
Chloride: 109 mmol/L (ref 98–111)
Creatinine, Ser: 1.08 mg/dL (ref 0.61–1.24)
GFR calc Af Amer: >60 mL/min (ref >60)
GFR calc non Af Amer: >60 mL/min (ref >60)
Glucose, Bld: 230 mg/dL — ABNORMAL HIGH (ref 70–99)
Potassium: 3.9 mmol/L (ref 3.5–5.1)
Sodium: 143 mmol/L (ref 135–145)

## 2019-09-24 LAB — CBC
HCT: 36.4 % — ABNORMAL LOW (ref 39.0–52.0)
Hemoglobin: 11.3 g/dL — ABNORMAL LOW (ref 13.0–17.0)
MCH: 30.7 pg (ref 26.0–34.0)
MCHC: 31 g/dL (ref 30.0–36.0)
MCV: 98.9 fL (ref 80.0–100.0)
Platelets: 111 10*3/uL — ABNORMAL LOW (ref 150–400)
RBC: 3.68 MIL/uL — ABNORMAL LOW (ref 4.22–5.81)
RDW: 17 % — ABNORMAL HIGH (ref 11.5–15.5)
WBC: 14 10*3/uL — ABNORMAL HIGH (ref 4.0–10.5)
nRBC: 0.4 % — ABNORMAL HIGH (ref 0.0–0.2)

## 2019-09-24 LAB — HEPARIN LEVEL (UNFRACTIONATED)
Heparin Unfractionated: 0.39 IU/mL (ref 0.30–0.70)
Heparin Unfractionated: 0.31 IU/mL (ref 0.30–0.70)

## 2019-09-24 LAB — PHOSPHORUS: Phosphorus: 1.7 mg/dL — ABNORMAL LOW (ref 2.5–4.6)

## 2019-09-24 LAB — TROPONIN I (HIGH SENSITIVITY): Troponin I (High Sensitivity): 134 ng/L (ref <18)

## 2019-09-24 MED ORDER — INSULIN GLARGINE 100 UNIT/ML ~~LOC~~ SOLN
10.0000 [IU] | Freq: Two times a day (BID) | SUBCUTANEOUS | Status: DC
  Administered 2019-09-24 – 2019-10-01 (×15): 10 [IU] via SUBCUTANEOUS
  Filled 2019-09-24 (×18): qty 0.1

## 2019-09-24 MED ORDER — PHENYLEPHRINE HCL-NACL 10-0.9 MG/250ML-% IV SOLN
0.0000 ug/min | INTRAVENOUS | Status: DC
  Administered 2019-09-24: 30 ug/min via INTRAVENOUS
  Administered 2019-09-24 (×2): 20 ug/min via INTRAVENOUS
  Administered 2019-09-24: 40 ug/min via INTRAVENOUS
  Administered 2019-09-28 (×2): 20 ug/min via INTRAVENOUS
  Filled 2019-09-24 (×3): qty 250

## 2019-09-24 MED ORDER — CHLORHEXIDINE GLUCONATE CLOTH 2 % EX PADS
6.0000 | MEDICATED_PAD | Freq: Every day | CUTANEOUS | Status: DC
  Administered 2019-09-24 – 2019-09-30 (×7): 6 via TOPICAL

## 2019-09-24 MED ORDER — PHENYLEPHRINE HCL-NACL 10-0.9 MG/250ML-% IV SOLN
INTRAVENOUS | Status: AC
  Filled 2019-09-24: qty 250

## 2019-09-24 MED ORDER — METOPROLOL TARTRATE 25 MG/10 ML ORAL SUSPENSION
50.0000 mg | Freq: Two times a day (BID) | ORAL | Status: DC
  Administered 2019-09-24: 50 mg
  Filled 2019-09-24 (×2): qty 20

## 2019-09-24 NOTE — Progress Notes (Addendum)
ANTICOAGULATION CONSULT NOTE   Pharmacy Consult for Heparin Indication: acute pulmonary embolus 09/22/19  Patient Measurements: Height: 5\' 8"  (172.7 cm)(Per family) Weight: 195 lb 15.8 oz (88.9 kg) IBW/kg (Calculated) : 68.4 Heparin Dosing Weight: 85 kg  Vital Signs: Temp: 98.6 F (37 C) (02/08 0715) Temp Source: Axillary (02/08 0715) BP: 118/79 (02/08 0815) Pulse Rate: 151 (02/08 0815)  Labs: Recent Labs    09/22/19 0219 09/22/19 0219 09/22/19 1035 09/22/19 1634 09/23/19 0123 09/23/19 0506 09/23/19 1039 09/24/19 0457  HGB 11.6*   < >  --   --   --  12.4*  --  11.3*  HCT 35.6*  --   --   --   --  40.0  --  36.4*  PLT 90*  --   --   --   --  116*  --  111*  HEPARINUNFRC  --   --   --    < > 0.63  --  0.58 0.39  CREATININE 1.21  --   --   --   --  1.25*  --  1.08  TROPONINIHS  --   --  267*  --   --   --   --   --    < > = values in this interval not displayed.    Assessment: 79 year old male with submassive acute bilateral PE 09/22/19, moderate clot burden, positive for R heart strain with RV/LV 1.3.  2/4 Lower extremity dopplers are negative for DVT, repeat 2/6 pending. TTE negative for LV thrombus. TEE on hold - needs barium study.  CT Head - no hemorrhage. MRI with multiple infarcts, chronic CVA and chronic microhemorrhage.   No overt bleeding noted. Hgb is stable at 11.3, plts 111. SCr is improving.   Pt at risk for hemorrhagic conversion given septic emboli. Heparin level wnl.   Goal of Therapy:  Heparin level 0.3-0.7 units/ml - Discussed with MD, will keep higher goal despite strokes due to clinical worsening and high risk of PE - try to keep ~0.5 Monitor platelets by anticoagulation protocol: Yes   Plan:  -Heparin adult 1300 units/hr -Daily HL, CBC   Dashia Caldeira, Jake Church 09/24/2019 9:32 AM   Addendum -Confirmatory level returned therapeutic at low end of the reference range, will make small adjustment in rate -Increase heparin to 1400 units/hr -Check next  level with AM labs   Harvel Quale 09/24/2019 2:56 PM

## 2019-09-24 NOTE — Progress Notes (Signed)
Bilateral lower extremity arterial duplex completed. Refer to "CV Proc" under chart review to view preliminary results.  09/24/2019 3:41 PM Kelby Aline., MHA, RVT, RDCS, RDMS

## 2019-09-24 NOTE — Progress Notes (Signed)
Received patient from 62M placed on monitor

## 2019-09-24 NOTE — Consult Note (Signed)
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 Carlis Abbott   Assessment:  1. 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 Septic emboli to CNS with CSF pleocytosis and left sided hemiplegia 3. Bilateral large pulmonary emboli with right heart strain 4. Hypoxemic respiratory failure 5. COPD 6. Afibrillation with RVR 7. MV repair for MR 8. RBBB 9. COVID pneumonia with admission to [facility] from 1/12 through 09/10/2019  Plan:  1. Continue vancomycin 2. Would plan for 6-week course of antibiotics 3. Would investigate for sources and metastatic sites of infection once the patient is off the ventilator 4. His COVID-19 airborne precautions can be discontinued as he is well > 21 days at now day 29 days from first positive test and with no fever and improvement in COVID symptoms  Active Problems:   Pneumonia   Pressure injury of skin   Cerebral embolism with cerebral infarction   Scheduled Meds: . aspirin  81 mg Per Tube Daily  . chlorhexidine gluconate (MEDLINE KIT)  15 mL Mouth Rinse BID  . Chlorhexidine Gluconate Cloth  6 each Topical Daily  . feeding supplement (PRO-STAT SUGAR FREE 64)  30 mL Per Tube BID  . free water  400 mL Per Tube Q4H  . insulin aspart  0-15 Units Subcutaneous Q4H  . insulin aspart  3 Units Subcutaneous Q6H  . insulin glargine  10 Units Subcutaneous BID  . levothyroxine  25 mcg Per Tube Q0600  . mouth rinse  15 mL Mouth Rinse 10 times per day  . metoprolol tartrate  50 mg Per Tube BID  . pantoprazole sodium  40 mg Per Tube Daily   Continuous Infusions: . sodium chloride Stopped (09/21/19 0740)  . dexmedetomidine (PRECEDEX) IV infusion 0.6 mcg/kg/hr (09/24/19 1100)  . feeding supplement (VITAL 1.5 CAL) 60 mL/hr at 09/24/19 1100  . fentaNYL infusion INTRAVENOUS 150 mcg/hr (09/24/19 1148)  . heparin 1,300 Units/hr (09/24/19 1100)  . midazolam  Stopped (09/20/19 0756)  . phenylephrine (NEO-SYNEPHRINE) Adult infusion 40 mcg/min (09/24/19 1105)  . phenylephrine    . vancomycin 1,250 mg (09/23/19 2200)   PRN Meds:.sodium chloride, fentaNYL, fentaNYL (SUBLIMAZE) injection, ipratropium-albuterol, metoprolol tartrate, midazolam  HPI: Chris Lewis is a 79 y.o. male with complicated past medical history including COPD, achalasia mitral regurgitation status post mitral valve repair, right bundle branch block who was recently admitted in January to [facility] with Covid pneumonia.  He was an inpatient there is in 12 January through the 25th.  His first positive test for Covid was on 11 January and symptoms preceded this.  Readmitted to [facility] with altered mental status and fall.  He was lethargic and nonverbal with left-sided hemiplegia.  Febrile to 103 degrees on admission with leukocytosis.  Blood cultures were obtained which have not grown any organism, though urine cultures have yielded methicillin resistant staph coccus aureus. He was started on antibiotics for possible meningitis with vancomycin, bacillin and cefepime.  Imaging of CNS has shown multiple infarcts suggestive of septic emboli to the central nervous system. LP showed mild CSF pleocytosis with 36-49 white cells and 97% neutrophils.  Cultures and CSF have been unrevealing.  Is also had hypoxemic respiratory failure and has been on the ventilator he has had bilateral massive pulmonary emboli with right ventricular strain.  He is also suffered from difficult to  control atrial fibrillation with a rapid ventricular response and has had problems with hypotension in the context of medications to control his atrial fibrillation.  He has been placed on airborne precautions for COVID-19.  However it is now well over 3 weeks and in fact over a month since his initial positive test and certainly even further along than his initial symptoms for Covid.  While he was febrile  on admission during this hospitalization that is likely due to infectious endocarditis with MRSA.  His respiratory failure is undoubtedly due to COPD with massive pulmonary emboli being superimposed upon that atrial fibrillation also being a possible culprit.  I certainly do not think he has any residual Covid pneumonia.  From a Covid standpoint he seems to have been improved and well beyond the window in which he would be considered infectious.  Therefore I would discontinue his airborne precautions.  We will continue treatment vancomycin for presumed endocarditis and hopefully if he recovers more we can question him about possible symptoms that might suggest other potential metastatic foci of infection.  Regardless we will plan on treating him for total of 6 weeks with IV vancomycin     The following day he underwent lumbar puncture which showed      Review of Systems: Review of Systems  Unable to perform ROS: Critical illness    Past Medical History:  Diagnosis Date  . Achalasia   . Anxiety    causes shortness  . Arthritis   . COPD (chronic obstructive pulmonary disease) ([place])    with chronic SOB  . Depression   . Dilated aortic root ([place])    59m by echo 03/2019  . FH: cholecystectomy   . Gallstones   . GERD (gastroesophageal reflux disease)   . Hiatal hernia   . History of alcoholism ([place]   . Hypercholesterolemia   . Hypothyroidism 01/12/2016  . Lesion of right lung 06/04/2013  . MVP (mitral valve prolapse)    with severe MR s/p MV repair  . Osteopenia   . Pneumonia 06/04/2013   aspirated food 14  . RBBB   . S/P right inguinal herniorrhaphy   . Seasonal allergic rhinitis   . [place] 10[place]2002  . Transient ischemic attack     Social History   Tobacco Use  . Smoking status: Former Smoker    Packs/day: 1.50    Years: 38.00    Pack years: 57.00    Types: Cigarettes    Quit date: 08/29/1997    Years since quitting: 22.0  . Smokeless tobacco: Never Used    Substance Use Topics  . Alcohol use: No  . Drug use: No    Family History  Problem Relation Age of Onset  . Stroke Father 944      cva  . Scleroderma Mother 867 . Lung cancer Sister   . Cancer Sister   . Heart disease Sister   . Esophageal cancer Other        nephew  . Prostate cancer Paternal Uncle   . Anesthesia problems Neg Hx   . Colon cancer Neg Hx   . Liver cancer Neg Hx    Allergies  Allergen Reactions  . Duac [Clindamycin Phos-Benzoyl Perox] Rash  . Contrast Media [Iodinated Diagnostic Agents] Rash    Heavy rash and itching 20 yrs ago. Needs full premeds and does well with this.  . Atorvastatin Other (See Comments)    myalgias  . Propoxyphene N-Acetaminophen Itching    rash  .  Rosuvastatin Other (See Comments)    myalgias  . Metrizamide Rash    radiopaque contrast agent. Heavy rash and itching 20 yrs ago. Needs full premeds and does well with this.  Orie Fisherman Vegetable Orange [Psyllium] Other (See Comments)    Heartburn   . Other Rash    High acid food--tomatoes, orange juice  . Tomato Other (See Comments)    Heartburn     OBJECTIVE: Blood pressure 97/66, pulse 80, temperature (!) 97.5 F (36.4 C), temperature source Axillary, resp. rate 15, height 5' 8"  (1.727 m), weight 88.9 kg, SpO2 96 %.  Physical Exam Constitutional:      Interventions: He is intubated.  HENT:     Head: Normocephalic and atraumatic.  Eyes:     General: No scleral icterus.    Extraocular Movements: Extraocular movements intact.  Cardiovascular:     Rate and Rhythm: Tachycardia present. Rhythm irregular.  Pulmonary:     Effort: He is intubated.  Abdominal:     General: There is no distension.     Palpations: There is no mass.     Tenderness: There is no abdominal tenderness. There is no rebound.     Hernia: No hernia is present.  Neurological:     Mental Status: He is alert.     Motor: Weakness present.    Has significant left lower extremity weakness Lab  Results Lab Results  Component Value Date   WBC 14.0 (H) 09/24/2019   HGB 11.3 (L) 09/24/2019   HCT 36.4 (L) 09/24/2019   MCV 98.9 09/24/2019   PLT 111 (L) 09/24/2019    Lab Results  Component Value Date   CREATININE 1.08 09/24/2019   BUN 55 (H) 09/24/2019   NA 143 09/24/2019   K 3.9 09/24/2019   CL 109 09/24/2019   CO2 25 09/24/2019    Lab Results  Component Value Date   ALT 28 09/21/2019   AST 21 09/21/2019   ALKPHOS 78 09/21/2019   BILITOT 0.8 09/21/2019     Microbiology: Recent Results (from the past 240 hour(s))  Urine culture     Status: Abnormal   Collection Time: 10/06/2019  3:43 PM   Specimen: Urine, Random  Result Value Ref Range Status   Specimen Description URINE, RANDOM  Final   Special Requests   Final    NONE Performed at [facility], [address] [place], [place] [address]    Culture (A)  Final    >=100,000 COLONIES/mL METHICILLIN RESISTANT STAPHYLOCOCCUS AUREUS   Report Status 09/20/2019 FINAL  Final   Organism ID, Bacteria METHICILLIN RESISTANT STAPHYLOCOCCUS AUREUS (A)  Final      Susceptibility   Methicillin resistant staphylococcus aureus - MIC*    CIPROFLOXACIN <=0.5 SENSITIVE Sensitive     GENTAMICIN <=0.5 SENSITIVE Sensitive     NITROFURANTOIN <=16 SENSITIVE Sensitive     OXACILLIN >=4 RESISTANT Resistant     TETRACYCLINE <=1 SENSITIVE Sensitive     VANCOMYCIN 1 SENSITIVE Sensitive     TRIMETH/SULFA <=10 SENSITIVE Sensitive     CLINDAMYCIN <=0.25 SENSITIVE Sensitive     RIFAMPIN <=0.5 SENSITIVE Sensitive     Inducible Clindamycin NEGATIVE Sensitive     * >=100,000 COLONIES/mL METHICILLIN RESISTANT STAPHYLOCOCCUS AUREUS  Blood Culture (routine x 2)     Status: None   Collection Time: 10/03/2019  6:31 PM   Specimen: BLOOD LEFT ARM  Result Value Ref Range Status   Specimen Description BLOOD LEFT ARM  Final   Special Requests  Final    BOTTLES DRAWN AEROBIC ONLY Blood Culture adequate volume   Culture   Final    NO GROWTH 5  DAYS Performed at [facility], [place] [address] [place], [place] [address]    Report Status 09/23/2019 FINAL  Final  Blood Culture (routine x 2)     Status: None   Collection Time: 09/27/2019  6:31 PM   Specimen: BLOOD LEFT ARM  Result Value Ref Range Status   Specimen Description BLOOD LEFT ARM  Final   Special Requests   Final    BOTTLES DRAWN AEROBIC ONLY Blood Culture results may not be optimal due to an inadequate volume of blood received in culture bottles   Culture   Final    NO GROWTH 5 DAYS Performed at [facility], [place] [address] [place], [place] [address]    Report Status 09/23/2019 FINAL  Final  MRSA PCR Screening     Status: Abnormal   Collection Time: 09/19/19  2:34 AM   Specimen: Nasal Mucosa; Nasopharyngeal  Result Value Ref Range Status   MRSA by PCR POSITIVE (A) NEGATIVE Final    Comment: CRITICAL RESULT CALLED TO, READ BACK BY AND VERIFIED WITH: RN Mammie Russian [phone] @ [phone] [facility] Performed at [facility], [place] [address], [place], [place] [address]   Fungus Culture With Stain     Status: None (Preliminary result)   Collection Time: 09/19/19  9:22 AM  Result Value Ref Range Status   Fungus Stain Final report  Final    Comment: (NOTE) Performed At: [facility] [place], [place] [id number] Rush Farmer MD [id number]    Fungus (Mycology) Culture PENDING  Incomplete   Fungal Source CSF  Final    Comment: Performed at [facility], [place] [address] [place], [place] [address]  CSF culture     Status: None (Preliminary result)   Collection Time: 09/19/19  9:22 AM   Specimen: CSF; Cerebrospinal Fluid  Result Value Ref Range Status   Specimen Description CSF  Final   Special Requests NONE  Final   Gram Stain   Final    WBC PRESENT, PREDOMINANTLY PMN NO ORGANISMS SEEN CYTOSPIN SMEAR    Culture   Final    NO GROWTH 2 DAYS Performed at [facility], [place] [address] [place], [place] [address]     Report Status PENDING  Incomplete  Fungus Culture Result     Status: None   Collection Time: 09/19/19  9:22 AM  Result Value Ref Range Status   Result 1 Comment  Final    Comment: (NOTE) KOH/Calcofluor preparation:  no fungus observed. Performed At: [facility] [place], [place] [id number] Rush Farmer MD CN:[id number]   Culture, respiratory (non-expectorated)     Status: None   Collection Time: 09/21/19  4:08 AM   Specimen: Tracheal Aspirate; Respiratory  Result Value Ref Range Status   Specimen Description TRACHEAL ASPIRATE  Final   Special Requests NONE  Final   Gram Stain   Final    MODERATE WBC PRESENT, PREDOMINANTLY PMN FEW GRAM POSITIVE COCCI IN PAIRS RARE YEAST Performed at [facility], [address] [place], [place] [address]    Culture   Final    RARE METHICILLIN RESISTANT STAPHYLOCOCCUS AUREUS FEW CANDIDA ALBICANS    Report Status 09/23/2019 FINAL  Final   Organism ID, Bacteria METHICILLIN RESISTANT STAPHYLOCOCCUS AUREUS  Final  Susceptibility   Methicillin resistant staphylococcus aureus - MIC*    CIPROFLOXACIN <=0.5 SENSITIVE Sensitive     ERYTHROMYCIN >=8 RESISTANT Resistant     GENTAMICIN <=0.5 SENSITIVE Sensitive     OXACILLIN >=4 RESISTANT Resistant     TETRACYCLINE <=1 SENSITIVE Sensitive     VANCOMYCIN <=0.5 SENSITIVE Sensitive     TRIMETH/SULFA <=10 SENSITIVE Sensitive     CLINDAMYCIN <=0.25 SENSITIVE Sensitive     RIFAMPIN <=0.5 SENSITIVE Sensitive     Inducible Clindamycin NEGATIVE Sensitive     * RARE METHICILLIN RESISTANT STAPHYLOCOCCUS AUREUS    Alcide Evener, [place] [facility] [phone] pager  09/24/2019, 12:50 PM

## 2019-09-24 NOTE — Progress Notes (Signed)
STROKE TEAM PROGRESS NOTE   INTERVAL HISTORY No family at bedside. Pt still intubated on vent. Able to open eyes on voice, however, not tracking, not following commands. With painful stimuli, mild withdraw to all extremities. On heparin drip. ID on board agree with 6-week IV abx given potential endocarditis.   Vitals:   09/24/19 0930 09/24/19 0945 09/24/19 1000 09/24/19 1002  BP: 110/80 114/69 115/74 115/74  Pulse: (!) 144 (!) 147 (!) 146 (!) 148  Resp: (!) 30 (!) 21 (!) 29   Temp:      TempSrc:      SpO2: 90% 92% 92%   Weight:      Height:        CBC:  Recent Labs  Lab 09/17/2019 1545 09/30/2019 1824 09/23/19 0506 09/24/19 0457  WBC 13.5*   < > 19.8* 14.0*  NEUTROABS 12.5*  --   --   --   HGB 13.8   < > 12.4* 11.3*  HCT 41.9   < > 40.0 36.4*  MCV 92.5   < > 98.5 98.9  PLT 56*   < > 116* 111*   < > = values in this interval not displayed.   Basic Metabolic Panel:  Recent Labs  Lab 09/21/19 0354 09/21/19 1128 09/23/19 0506 09/24/19 0457 09/24/19 0944  NA 145   < > 146* 143  --   K 3.7   < > 4.1 3.9  --   CL 114*   < > 113* 109  --   CO2 23   < > 26 25  --   GLUCOSE 190*   < > 218* 230*  --   BUN 46*   < > 52* 55*  --   CREATININE 1.28*   < > 1.25* 1.08  --   CALCIUM 7.2*   < > 7.4* 7.1*  --   MG 2.5*  --  2.3  --  2.3  PHOS 2.3*  --  2.5  --   --    < > = values in this interval not displayed.   Lipid Panel:     Component Value Date/Time   CHOL 134 09/21/2019 0354   TRIG 113 09/21/2019 0354   HDL 21 (L) 09/21/2019 0354   CHOLHDL 6.4 09/21/2019 0354   VLDL 23 09/21/2019 0354   LDLCALC 90 09/21/2019 0354   HgbA1c:  Lab Results  Component Value Date   HGBA1C 6.8 (H) 09/20/2019   Urine Drug Screen:     Component Value Date/Time   LABOPIA NONE DETECTED 10/27/2015 2210   COCAINSCRNUR NONE DETECTED 10/27/2015 2210   LABBENZ NONE DETECTED 10/27/2015 2210   AMPHETMU NONE DETECTED 10/27/2015 2210   THCU NONE DETECTED 10/27/2015 2210   LABBARB NONE DETECTED  10/27/2015 2210    Alcohol Level     Component Value Date/Time   ETH <5 10/27/2015 1855    IMAGING past 48 hours  CT ANGIO CHEST PE W OR WO CONTRAST  Result Date: 09/22/2019 CLINICAL DATA:  79 year old who was hospitalized with COVID-19 pneumonia from 08/28/2019 through 09/10/2019, now with ventilator dependent respiratory failure. He was readmitted on 10/04/2019 after acute mental status changes and a possible stroke. Current history of COPD, hypothyroidism and achalasia. Personal history of mitral valve replacement. EXAM: CT ANGIOGRAPHY CHEST WITH CONTRAST TECHNIQUE: Multidetector CT imaging of the chest was performed using the standard protocol during bolus administration of intravenous contrast. Multiplanar CT image reconstructions and MIPs were obtained to evaluate the vascular anatomy. CONTRAST:  36mL  OMNIPAQUE IOHEXOL 350 MG/ML IV. COMPARISON:  08/28/2019 and earlier. FINDINGS: Cardiovascular: Contrast opacification of the pulmonary arteries is excellent. Filling defect involving the LOWER LOBE pulmonary artery extending into several segmental and subsegmental branches. Filling defect involving the RIGHT interlobar pulmonary artery with extension into the RIGHT MIDDLE LOBE pulmonary artery and the RIGHT LOWER LOBE pulmonary artery and several segmental branches. Overall clot burden is moderate. RV/LV ratio measures 1.3, indicating RIGHT heart strain. Cardiac silhouette moderately enlarged. No pericardial effusion. Mild LAD coronary atherosclerosis. Mild atherosclerosis involving the thoracic and proximal abdominal aorta without evidence of aneurysm. Reflux of contrast from the RIGHT atrium into the intrahepatic IVC and hepatic veins. Mediastinum/Nodes: No pathologically enlarged mediastinal, hilar or axillary lymph nodes. No mediastinal masses. Fluid-filled esophagus. Normal-appearing thyroid gland. Lungs/Pleura: Endotracheal tube in satisfactory position with its tip approximately 4-5 cm above the  carina. Emphysematous changes throughout both lungs as noted previously. Residual scattered peripheral airspace opacities throughout both lungs when compared to the 08/28/2019 examination, with interval improvement. Densely calcified RIGHT LOWER LOBE granuloma as noted previously. Very small BILATERAL pleural effusions. Upper Abdomen: Nasogastric tube courses into the stomach. Visualized upper abdomen unremarkable. Musculoskeletal: Multilevel degenerative disc disease and spondylosis involving the mid and lower thoracic spine. No acute findings. Review of the MIP images confirms the above findings. IMPRESSION: 1. Acute BILATERAL pulmonary emboli. Overall clot burden is moderate. 2. CT evidence of RIGHT heart strain (RV/LV Ratio = 1.3) indicating at least submassive (intermediate risk) PE. The presence of right heart strain has been associated with an increased risk of morbidity and mortality. 3. Residual scattered peripheral airspace opacities throughout both lungs since the 08/28/2019 CT, indicating persistent pneumonia, with interval improvement. 4. Very small BILATERAL pleural effusions. Aortic Atherosclerosis (ICD10-I70.0) and Emphysema (ICD10-J43.9). These results will be called to the ordering clinician or representative by the Radiologist Assistant, and communication documented in the PACS or zVision Dashboard. Electronically Signed   By: Evangeline Dakin M.D.   On: 09/22/2019 15:01   DG Chest Port 1 View  Result Date: 09/24/2019 CLINICAL DATA:  F/u Covid +, they are aware the central line is not in correct position, they have pulled back, and will re do another time, per rn EXAM: PORTABLE CHEST 1 VIEW COMPARISON:  Radiograph 09/22/2019 FINDINGS: Endotracheal tube, NG tube and central venous line unchanged. Central venous line remains in the brachycephalic vein. Normal cardiac silhouette. There is bibasilar patchy airspace disease not changed from prior. No pneumothorax. IMPRESSION: 1. Stable support  apparatus. 2. Patchy bibasilar airspace disease again noted. Electronically Signed   By: Suzy Bouchard M.D.   On: 09/24/2019 10:37   DG CHEST PORT 1 VIEW  Result Date: 09/22/2019 CLINICAL DATA:  Central line placement EXAM: PORTABLE CHEST 1 VIEW COMPARISON:  September 19, 2019 FINDINGS: There is stable endotracheal tube and nasogastric tube positioning. A left internal jugular venous catheter is seen. Its distal tip is seen overlying the superior mediastinum on the left, just above the aortic arch. Mild infiltrates are seen within the bilateral lung bases. These are stable in appearance. There is no evidence of a pleural effusion or pneumothorax. The cardiac silhouette is mildly enlarged and unchanged in size. The visualized skeletal structures are unremarkable. IMPRESSION: 1. Interval left internal jugular venous catheter replacement versus repositioning when compared to the prior chest plain film dated September 19, 2019. 2. Resolution of the small left apical pneumothorax seen on the prior study. 3. Stable bibasilar infiltrates. Electronically Signed   By: Joyce Gross.D.  On: 09/22/2019 17:07    PHYSICAL EXAM  Temp:  [97.5 F (36.4 C)-98.6 F (37 C)] 97.7 F (36.5 C) (02/08 1509) Pulse Rate:  [52-151] 78 (02/08 1400) Resp:  [12-32] 14 (02/08 1400) BP: (87-138)/(49-93) 121/74 (02/08 1400) SpO2:  [88 %-97 %] 97 % (02/08 1400) FiO2 (%):  [35 %-45 %] 40 % (02/08 1330) Weight:  [88.9 kg] 88.9 kg (02/08 0500)  General - Well nourished, well developed, intubated on sedation.  Ophthalmologic - fundi not visualized due to noncooperation.  Cardiovascular - Regular rate and rhythm.  Neuro - intubated on precedex and fentanyl, eyes closed but easily open on voice, not following commands. With eye opening, eyes in mid position, did not gaze bilaterally as requested. Left pupil 2.68mm reactive to light. Right corneal clouding and not able to see pupil for exam. Inconsistently blinking to visual  threat bilaterally, doll's eyes sluggish, not tracking. Corneal reflex present, gag and cough present. Breathing over the vent.  Facial symmetry not able to test due to ET tube.  Tongue protrusion not cooperative. On pain stimulation, mild withdraw in all extremities, left UE and LE moves slightly more than right. DTR 1+ and bilaterally babinski positive. Sensation, coordination and gait not tested.  ASSESSMENT/PLAN Mr. Chris Lewis is a 79 y.o. male with history of COPD, HLD, MVP s/p repair, TIA in 2004, hypothyroidism, depression/anxiety who was was admitted at [facility] for [place] 08/27/2019 thru 09/10/2019, fell while trying to get OOB at home and found altered with progressive confusion, not speaking by family. Concern for aphasia and R sided weakness in the ED.   Stroke: B anterior and posterior circulation infarcts embolic secondary to unclear source - differentials include: Aflutter with RVR, cardiomyopathy with low EF, massive PE with ? PFO, hypercoagulaility from COVID infection, ? endocarditis from sepsis  CT head age indeterminate R frontal lobe infarct.   CTA head & neck no ELVO.   CT perfusion normal  MRI  Multiple small B anterior and posterior circulation infarcts. Old R frontal lobe infarct.   2D Echo RV severely reduced systolic fxn, severe increase in size. LV EF 35%. MV repair appears intact.  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   Therapy recommendations:  pending   Disposition:  pending   Code Status - limited code  Acute Hypoxemic Respiratory Failure COVID-19 Infection Massive PE  Positive test 08/27/2019   [facility]H for COVID 08/27/2019  thru 09/10/2019  Home O2 increased from 2L->4L  On hydrocortisone taper at home  Now Intubated  Persistent pneumonia, with interval improvement by chest CT 09/22/2019  Vancomycin started 09/30/2019>>  Shock - septic vs PE with R heart strain vs cardiogenic ? MRSA endocarditis MRSA PNA, MRSA UTI  Elevated WBC - 13.5->15.3->19.8->14.0   Afebrile now  Hypotensive - on Levophed and Phenylephrine   Thrombocytopenia, improving - 56->84->90->116  ID on board, agree with 6-week IV abx  CCM on board  NSTEMI Acute HF w/ EF 35% - ? COVID Myocarditis Atrial Flutter w/ RVR  Cardiology on board  TEE unable to perform d/t hx alchalasia  2D echo with Definity shows  no LV thrombus  On heparin IV and ASA 81  Massive PE  CTA Chest -  Acute BILATERAL pulmonary emboli. Overall clot burden is moderate.   Heparin IV per pharmacy  DVT neg x 2  TCD bubble study - may consider once stable  Hyperlipidemia  Home meds:   No statin d/t intolerance  LDL 90, goal < 70  May Consider [facility]-9 as outpt   Aseptic Meningitis  CSF WBC 36, RBC 78, 97% neu, pro 48, glu 97  Likely nonbacterial   time course not in favor of Covid meningoencephalitis as he tested positive 4 weeks ago.   Supportive care  Hyperglycemia   A1C 6.8  Hyperglycemia  On lantus  SSI  CBG monitoring  Other Stroke Risk Factors  Advanced age  Former Cigarette smoker, quit 22 yrs ago  Hx stroke/TIA  10/2002 - TIA w/ transient R HH   Family hx stroke (father)  Coronary artery disease  MV stenosis s/p repair  Other Active Problems  AKI / NGMA - creatinine - 1.28->1.21->1.25->1.08   Hypothyroidism    Anxiety/pain  Aortic Atherosclerosis (ICD10-I70.0)   Emphysema (ICD10-J43.9).   Severe esophageal stenosis, hx alchalasia  Hospital day # 6  This patient is critically ill and at significant risk of neurological worsening, death and care requires constant monitoring of vital signs,  hemodynamics,respiratory and cardiac monitoring, extensive review of multiple databases, frequent neurological assessment, discussion with family, other specialists and medical decision making of high complexity. I spent 30 minutes of neurocritical care time  in the care of  this patient.  Rosalin Hawking, MD PhD Stroke Neurology 09/24/2019 6:15 PM     To contact Stroke Continuity provider, please refer to [email or web address] After hours, contact General Neurology

## 2019-09-24 NOTE — Progress Notes (Addendum)
NAME:  Chris Lewis, MRN:  [id number], DOB:  1941-04-26, LOS: 6 ADMISSION DATE:  10/14/2019, CONSULTATION DATE:  09/30/2019 REFERRING MD:  Alvino Chapel, CHIEF COMPLAINT:  AMS, fall  Brief History   80 year old man with a history of COPD (emphysema seen on CT scan), Achalasia, anxiety/depression, HLD, hypothyroidism, RBBB, MR s/p MV repair, Recently hospitalized at [facility] for covid PNA (1/12-1/25) now here with AMS. Per wife he fell overnight while trying to get out of bed on his own.  AM of admit was found altered and not speaking by his family.    Past Medical History  Achalasia, anxiety/depression, HLD, hypothyroidism, RBBB, MR s/p MV repair, Recently hospitalized at [facility] for covid PNA (1/12-1/25)   Significant Hospital Events   2/02 Admit  2/5 through 2/8: 2/5: Weaning sedation, weaning epinephrine.  Bradycardic on Precedex.  Atrial flutter on telemetry.  Heart rate as high as 160s on norepinephrine, changed to phenylephrine  2/7 requiring metoprolol for rapid heart rhythm.  Digoxin loaded.  2/7 improved neurologically. 2/8: Infectious disease consulted, Lantus increased, renal function normalized, white blood cell count trending down, hemoglobin stable.  Off pressors still tachycardic, tachypneic.  Added Lopressor scheduled via tube, resuming Precedex, discontinuing stress dose steroids Consults:  PCCM  Neurology  Infectious disease consulted 2/8 p Procedures:  ETT 2/2 >>  CVC (backed out but still in innominate vein on CXR)   Significant Diagnostic Tests:  CT Head 2/2 >> motion degraded study, age-indeterminate infarct involving the R frontal lobe CTA Head / Neck / Perfusion 2/2 >> no emergent large vessel occlusion, normal cerebral CT perfusion Echo 2/5-dilated RV with reduced systolic function and evidence of overload, LVEF 35 to 40% with global hypokinesis  Micro Data:  MRSA PCR 2/3 >> positive  UC 2/2 >> MRSA 100k CSF 2/4 >> NG final BCx2 2/2 >> NG final  resp cx 2/5>> MRSA (R  erythromycin), few candida albicans  Antimicrobials:   Ampicillin 2/2 through 2/5 Cefepime 2/2 through 2/5 Ceftriaxone 2/5 through 2/4 Flagyl 2/2 through 2/5 Vancomycin 2/2>>>   Interim history/subjective:  Tachypneic this morning, still tachycardic with atrial flutter blood pressure stabilized off pressors still mottled  Objective   Blood pressure 118/79, pulse (Abnormal) 151, temperature 98.6 F (37 C), temperature source Axillary, resp. rate 18, height 5\' 8"  (1.727 m), weight 88.9 kg, SpO2 90 %.    Vent Mode: PRVC FiO2 (%):  [35 %-45 %] 45 % Set Rate:  [20 bmp] 20 bmp Vt Set:  [540 mL] 540 mL PEEP:  [5 cmH20] 5 cmH20 Plateau Pressure:  [11 cmH20-17 cmH20] 17 cmH20   Intake/Output Summary (Last 24 hours) at 09/24/2019 0900 Last data filed at 09/24/2019 0800 Gross per 24 hour  Intake 3102.88 ml  Output 1785 ml  Net 1317.88 ml   Filed Weights   09/22/19 0500 09/23/19 0500 09/24/19 0500  Weight: 87.6 kg 86.3 kg 88.9 kg    Examination: General this is a 79 year old white male currently awake, interactive, he appears uncomfortable this morning, does follow commands.  He is tachypneic and tachycardic on exam HEENT normocephalic atraumatic orally intubated there is a soft cuff leak from the endotracheal tube no JVD appreciated, mucous membranes are moist sclera are injected Pulmonary: Coarse scattered rhonchi, tachypneic respiratory rate, mild accessory use this subsides some with as needed sedation Current ventilator settings: PEEP 5, FiO2 45%.  Unable to calculate plateau pressure given ventilator asynchrony Cardiac: Tachycardic regular, appears to be atrial flutter no murmur rub or gallop appreciated  Extremities: Mottled throughout, diffuse anasarca specifically in the upper extremities, pulses are palpable Neuro: Opens eyes, follows commands, no focal motor deficits are appreciated GU clear yellow urine  Resolved Hospital Problem list   None anion gap metabolic  acidosis Acute kidney failure, creatinine normalized on 2/8 Assessment & Plan:   Acute Metabolic Encephalopathy: multifactorial, out of proportion to stroke alone, mild meningitic picture by LP.  Neurology following. Improved today. Plan Continuing supportive care Add Precedex, goal RASS -1  Acute Hypoxemic Respiratory Failure- MRSA pneumonia. Bilateral submassive vs massive PE- provoked with covid.  last chest x-ray from 2/6 demonstrates endotracheal tube in satisfactory position, left IJ  catheter appreciated, looks like it had been retracted some when comparing prior film there are bibasilar infiltrates, and right greater than left airspace disease aeration perhaps a little improved when comparing film on 2/3 Tachypneic this morning Plan Continue 6 ml/kg PBW lung protective ventilation Titrate PEEP/FiO2 for pulse oximetry greater than 88, PaO2 goal 55 to 65% Plateau pressure goal less than 30, driving pressure goal less than 15, currently unable to calculate due to ventilator asynchrony Repeat chest x-ray today IV vancomycin as below VAP bundle  Shock -unsure if septic versus obstructive due to PE, versus less likely cardiogenic  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 TTE no vegetations, but unable to get TEE 2/2 achalasia. Presumed endocarditis or bacteremia related to both infections. LVEF 35%, IVC not collapsing on [place]  Plan Consulting infectious disease, anticipate minimum 6-week course of antibiotics, continuing vancomycin Continuing full dose anticoagulation for pulmonary emboli Stress dose steroids, day #5, okay to discontinue Keep euvolemic  NSTEMI Acute BiVentricular Failure / COVID Myocarditis  Aflutter with RVR RBBB old.  Trop elevated but trending down.  Heart rate remains elevated Plan Continue telemetry monitoring Potassium goal greater than 4, magnesium goal greater than 2 Cont asa Added VT lopressor  Thrombocytopenia; increases risk of  bleeding due to anticoagulation. Improving, and stable over the last 48 hours Plan Continue to trend CBC  At risk for fluid and electrolyte imbalance, resolved acute kidney injury Plan Trend electrolytes Avoid hypotension Renal dose medications A.m. chemistry  Hypothyroidism  TSH 2.7 on admit  Plan Continue home dosing of Synthroid  Hyperglycemia- improving control Lantus increased to 10 units daily on 2/7 glycemic control worse over the evening, need tighter control Plan Change Lantus to 10 units twice daily Continue sliding scale insulin  Anxiety/pain  Plan Continuing as needed fentanyl  Severe esophageal stenosis, history of achalasia not able to perform TEE Plan We will need special attention to swallowing evaluation post acute illness  Best practice:  Diet: npo, getting tube feeds Pain/Anxiety/Delirium protocol (if indicated): PAD protocol, adding back Precedex on 2/8 VAP protocol (if indicated): in place DVT prophylaxis: heparin GI prophylaxis: protonix  Glucose control: SSI Mobility: BR Code Status: wife Jone updated via phone Disposition: ICU remains critically ill.  My critical care x32 minutes Erick Colace ACNP-BC [place] Pager # [phone] OR # [phone] if no answer

## 2019-09-24 NOTE — Progress Notes (Signed)
[place] Progress Note Patient Name: Chris Lewis DOB: Dec 17, 1940 MRN: [id number]   Date of Service  09/24/2019  HPI/Events of Note  EKG with SB , ST changes noted. B/p stable . HR 51. O2 sat 96%.  Known chf, EF 35-40% on recent echo.   eICU Interventions  Check labs  Troponin x 2  Mg/PHos.   Remain off precedex /metoprolol.      Intervention Category Intermediate Interventions: Arrhythmia - evaluation and management  Chris Lewis 09/24/2019, 10:09 PM

## 2019-09-24 NOTE — Progress Notes (Signed)
[place] Progress Note Patient Name: Chris Lewis DOB: April 30, 1941 MRN: [id number]   Date of Service  09/24/2019  HPI/Events of Note  pt has been rate controlled afib - now bradying and having long pauses. precedex turned off. on little bit of neo. RN at bedside. BP ok  Mg/phos /  nml today .    eICU Interventions  1. Remain off precedex.  2. Hold Metoprolol .  3. Check EKG .      Intervention Category Intermediate Interventions: Arrhythmia - evaluation and management  Easton Fetty 09/24/2019, 9:06 PM

## 2019-09-25 ENCOUNTER — Telehealth: Payer: Self-pay

## 2019-09-25 DIAGNOSIS — R7881 Bacteremia: Secondary | ICD-10-CM

## 2019-09-25 DIAGNOSIS — I6340 Cerebral infarction due to embolism of unspecified cerebral artery: Secondary | ICD-10-CM

## 2019-09-25 DIAGNOSIS — I330 Acute and subacute infective endocarditis: Secondary | ICD-10-CM

## 2019-09-25 DIAGNOSIS — B9561 Methicillin susceptible Staphylococcus aureus infection as the cause of diseases classified elsewhere: Secondary | ICD-10-CM

## 2019-09-25 LAB — GLUCOSE, CAPILLARY
Glucose-Capillary: 103 mg/dL — ABNORMAL HIGH (ref 70–99)
Glucose-Capillary: 109 mg/dL — ABNORMAL HIGH (ref 70–99)
Glucose-Capillary: 118 mg/dL — ABNORMAL HIGH (ref 70–99)
Glucose-Capillary: 121 mg/dL — ABNORMAL HIGH (ref 70–99)
Glucose-Capillary: 129 mg/dL — ABNORMAL HIGH (ref 70–99)
Glucose-Capillary: 99 mg/dL (ref 70–99)

## 2019-09-25 LAB — CBC
HCT: 40.9 % (ref 39.0–52.0)
Hemoglobin: 12.7 g/dL — ABNORMAL LOW (ref 13.0–17.0)
MCH: 30.7 pg (ref 26.0–34.0)
MCHC: 31.1 g/dL (ref 30.0–36.0)
MCV: 98.8 fL (ref 80.0–100.0)
Platelets: 160 10*3/uL (ref 150–400)
RBC: 4.14 MIL/uL — ABNORMAL LOW (ref 4.22–5.81)
RDW: 17.1 % — ABNORMAL HIGH (ref 11.5–15.5)
WBC: 20.5 10*3/uL — ABNORMAL HIGH (ref 4.0–10.5)
nRBC: 0.4 % — ABNORMAL HIGH (ref 0.0–0.2)

## 2019-09-25 LAB — HEPARIN LEVEL (UNFRACTIONATED)
Heparin Unfractionated: 0.52 IU/mL (ref 0.30–0.70)
Heparin Unfractionated: 1.34 IU/mL — ABNORMAL HIGH (ref 0.30–0.70)
Heparin Unfractionated: 0.26 IU/mL — ABNORMAL LOW (ref 0.30–0.70)
Heparin Unfractionated: 0.33 IU/mL (ref 0.30–0.70)

## 2019-09-25 LAB — BASIC METABOLIC PANEL
Anion gap: 6 (ref 5–15)
BUN: 55 mg/dL — ABNORMAL HIGH (ref 8–23)
CO2: 28 mmol/L (ref 22–32)
Calcium: 7.6 mg/dL — ABNORMAL LOW (ref 8.9–10.3)
Chloride: 116 mmol/L — ABNORMAL HIGH (ref 98–111)
Creatinine, Ser: 1.09 mg/dL (ref 0.61–1.24)
GFR calc Af Amer: >60 mL/min (ref >60)
GFR calc non Af Amer: >60 mL/min (ref >60)
Glucose, Bld: 142 mg/dL — ABNORMAL HIGH (ref 70–99)
Potassium: 3.8 mmol/L (ref 3.5–5.1)
Sodium: 150 mmol/L — ABNORMAL HIGH (ref 135–145)

## 2019-09-25 LAB — TROPONIN I (HIGH SENSITIVITY): Troponin I (High Sensitivity): 162 ng/L (ref <18)

## 2019-09-25 LAB — MAGNESIUM: Magnesium: 2.2 mg/dL (ref 1.7–2.4)

## 2019-09-25 MED ORDER — SODIUM CHLORIDE 0.9% FLUSH: 10.0000 mL | INTRAVENOUS | Status: DC | PRN

## 2019-09-25 MED ORDER — ARTIFICIAL TEARS OPHTHALMIC OINT
TOPICAL_OINTMENT | OPHTHALMIC | Status: DC | PRN
  Filled 2019-09-25: qty 3.5

## 2019-09-25 MED ORDER — ESCITALOPRAM OXALATE 10 MG PO TABS: 10.0000 mg | ORAL_TABLET | Freq: Every day | ORAL | Status: DC

## 2019-09-25 MED ORDER — IPRATROPIUM-ALBUTEROL 0.5-2.5 (3) MG/3ML IN SOLN
3.0000 mL | Freq: Four times a day (QID) | RESPIRATORY_TRACT | Status: DC
  Administered 2019-09-25 – 2019-10-01 (×26): 3 mL via RESPIRATORY_TRACT
  Filled 2019-09-25 (×26): qty 3

## 2019-09-25 MED ORDER — SODIUM CHLORIDE 0.9% FLUSH
10.0000 mL | Freq: Two times a day (BID) | INTRAVENOUS | Status: DC
  Administered 2019-09-25: 11:00:00 10 mL
  Administered 2019-09-25: 20 mL
  Administered 2019-09-26 – 2019-10-01 (×6): 10 mL

## 2019-09-25 MED ORDER — LACTATED RINGERS IV BOLUS
1000.0000 mL | Freq: Once | INTRAVENOUS | Status: AC
  Administered 2019-09-25: 1000 mL via INTRAVENOUS

## 2019-09-25 MED ORDER — ESCITALOPRAM OXALATE 10 MG PO TABS
10.0000 mg | ORAL_TABLET | Freq: Every day | ORAL | Status: DC
  Administered 2019-09-25 – 2019-10-01 (×7): 10 mg
  Filled 2019-09-25 (×7): qty 1

## 2019-09-25 MED ORDER — SODIUM PHOSPHATES 45 MMOLE/15ML IV SOLN
20.0000 mmol | Freq: Once | INTRAVENOUS | Status: AC
  Administered 2019-09-25: 20 mmol via INTRAVENOUS
  Filled 2019-09-25: qty 6.67

## 2019-09-25 NOTE — Telephone Encounter (Signed)
PCP is aware of hospital admission.

## 2019-09-25 NOTE — Progress Notes (Signed)
STROKE TEAM PROGRESS NOTE   INTERVAL HISTORY Wife at bedside. Pt still intubated, on ventilation, seems to be in labored breathing. On low dose fentanyl and precedex. Heparin IV ongoing. He was able to open eyes on voice but not following commands. Slight movement on pain stimulation.   Vitals:   09/25/19 1000 09/25/19 1015 09/25/19 1030 09/25/19 1045  BP: (!) 86/39     Pulse: (!) 35 (!) 39 70 73  Resp: 20 (!) 24 (!) 23 (!) 22  Temp:      TempSrc:      SpO2: 92% 93% 92% 91%  Weight:      Height:        CBC:  Recent Labs  Lab 09/24/2019 1545 10/07/2019 1824 09/24/19 0457 09/25/19 0129  WBC 13.5*   < > 14.0* 20.5*  NEUTROABS 12.5*  --   --   --   HGB 13.8   < > 11.3* 12.7*  HCT 41.9   < > 36.4* 40.9  MCV 92.5   < > 98.9 98.8  PLT 56*   < > 111* 160   < > = values in this interval not displayed.   Basic Metabolic Panel:  Recent Labs  Lab 09/23/19 0506 09/23/19 0506 09/24/19 0457 09/24/19 0944 09/24/19 2223 09/25/19 0129  NA 146*   < > 143  --   --  150*  K 4.1   < > 3.9  --   --  3.8  CL 113*   < > 109  --   --  116*  CO2 26   < > 25  --   --  28  GLUCOSE 218*   < > 230*  --   --  142*  BUN 52*   < > 55*  --   --  55*  CREATININE 1.25*   < > 1.08  --   --  1.09  CALCIUM 7.4*   < > 7.1*  --   --  7.6*  MG 2.3  --   --    < > 2.2 2.2  PHOS 2.5  --   --   --  1.7*  --    < > = values in this interval not displayed.   Lipid Panel:     Component Value Date/Time   CHOL 134 09/21/2019 0354   TRIG 113 09/21/2019 0354   HDL 21 (L) 09/21/2019 0354   CHOLHDL 6.4 09/21/2019 0354   VLDL 23 09/21/2019 0354   LDLCALC 90 09/21/2019 0354   HgbA1c:  Lab Results  Component Value Date   HGBA1C 6.8 (H) 09/20/2019   Urine Drug Screen:     Component Value Date/Time   LABOPIA NONE DETECTED 10/27/2015 2210   COCAINSCRNUR NONE DETECTED 10/27/2015 2210   LABBENZ NONE DETECTED 10/27/2015 2210   AMPHETMU NONE DETECTED 10/27/2015 2210   THCU NONE DETECTED 10/27/2015 2210    LABBARB NONE DETECTED 10/27/2015 2210    Alcohol Level     Component Value Date/Time   ETH <5 10/27/2015 1855    IMAGING past 48 hours  DG Chest Port 1 View  Result Date: 09/24/2019 CLINICAL DATA:  F/u Covid +, they are aware the central line is not in correct position, they have pulled back, and will re do another time, per rn EXAM: PORTABLE CHEST 1 VIEW COMPARISON:  Radiograph 09/22/2019 FINDINGS: Endotracheal tube, NG tube and central venous line unchanged. Central venous line remains in the brachycephalic vein. Normal cardiac silhouette. There is bibasilar  patchy airspace disease not changed from prior. No pneumothorax. IMPRESSION: 1. Stable support apparatus. 2. Patchy bibasilar airspace disease again noted. Electronically Signed   By: Suzy Bouchard M.D.   On: 09/24/2019 10:37   VAS [place] LOWER EXTREMITY ARTERIAL DUPLEX  Result Date: 09/24/2019 LOWER EXTREMITY ARTERIAL DUPLEX STUDY Indications: Pallor. Cool extremities. COVID-19 positive.  Current ABI: Not obtained Limitations: Patient position Comparison Study: No prior study Performing Technologist: Maudry Mayhew MHA, RDMS, RVT, RDCS  Examination Guidelines: A complete evaluation includes B-mode imaging, spectral Doppler, color Doppler, and power Doppler as needed of all accessible portions of each vessel. Bilateral testing is considered an integral part of a complete examination. Limited examinations for reoccurring indications may be performed as noted.  +----------+--------+-----+--------+---------+--------+ RIGHT     PSV cm/sRatioStenosisWaveform Comments +----------+--------+-----+--------+---------+--------+ CFA Distal52                   triphasic         +----------+--------+-----+--------+---------+--------+ DFA       33                   triphasic         +----------+--------+-----+--------+---------+--------+ SFA Prox  75                   triphasic          +----------+--------+-----+--------+---------+--------+ SFA Mid   53                   triphasic         +----------+--------+-----+--------+---------+--------+ SFA Distal59                   triphasic         +----------+--------+-----+--------+---------+--------+ POP Prox  58                   triphasic         +----------+--------+-----+--------+---------+--------+ POP Distal78                   triphasic         +----------+--------+-----+--------+---------+--------+ ATA Distal67                   triphasic         +----------+--------+-----+--------+---------+--------+ PTA Prox  87                   triphasic         +----------+--------+-----+--------+---------+--------+ PTA Distal83                   triphasic         +----------+--------+-----+--------+---------+--------+ PERO Mid  90                   triphasic         +----------+--------+-----+--------+---------+--------+ DP        43                   triphasic         +----------+--------+-----+--------+---------+--------+  +-----------+--------+-----+--------+---------+--------+ LEFT       PSV cm/sRatioStenosisWaveform Comments +-----------+--------+-----+--------+---------+--------+ CFA Distal 48                   triphasic         +-----------+--------+-----+--------+---------+--------+ DFA        45                   triphasic         +-----------+--------+-----+--------+---------+--------+  SFA Prox   60                   triphasic         +-----------+--------+-----+--------+---------+--------+ SFA Mid    139                  triphasic         +-----------+--------+-----+--------+---------+--------+ SFA Distal 55                   triphasic         +-----------+--------+-----+--------+---------+--------+ POP Prox   39                   triphasic         +-----------+--------+-----+--------+---------+--------+ POP Distal 46                    triphasic         +-----------+--------+-----+--------+---------+--------+ ATA Distal 20                   triphasic         +-----------+--------+-----+--------+---------+--------+ PTA Distal 94                   triphasic         +-----------+--------+-----+--------+---------+--------+ PERO Distal38                   triphasic         +-----------+--------+-----+--------+---------+--------+ DP         48                   triphasic         +-----------+--------+-----+--------+---------+--------+   Summary: Bilateral: No evidence of arterial occlusive disease.  Incidental finding: sonographic evidence of Baker's cyst in the left popliteal fossa. See table(s) above for measurements and observations. Electronically signed by Monica Martinez MD on 09/24/2019 at 4:20:02 PM.    Final     PHYSICAL EXAM  Temp:  [97.4 F (36.3 C)-98.8 F (37.1 C)] 98.7 F (37.1 C) (02/09 0700) Pulse Rate:  [35-108] 73 (02/09 1045) Resp:  [7-41] 22 (02/09 1045) BP: (77-141)/(39-95) 86/39 (02/09 1000) SpO2:  [90 %-100 %] 91 % (02/09 1045) FiO2 (%):  [40 %] 40 % (02/09 0916) Weight:  [85 kg] 85 kg (02/09 0530)  General - Well nourished, well developed, intubated on sedation.  Ophthalmologic - fundi not visualized due to noncooperation.  Cardiovascular - Regular rate and rhythm with frequent PACs on tele.  Neuro - intubated on precedex and low dose fentanyl, eyes closed but easily open on voice, not following commands. With eye opening, eyes in mid position, did not gaze bilaterally as requested. Left pupil 2.79mm reactive to light. Right corneal clouding and not able to see pupil on exam. Inconsistently blinking to visual threat on the right but blinking to the left consistently, doll's eyes sluggish, not tracking. Corneal reflex present, gag and cough present. Breathing over the vent.  Facial symmetry not able to test due to ET tube.  Tongue protrusion not cooperative. On pain  stimulation, slight withdraw in all extremities, L>R. DTR 1+ and bilaterally babinski positive. Sensation, coordination and gait not tested.  ASSESSMENT/PLAN Mr. LORENZA RUSEK is a 79 y.o. male with history of COPD, HLD, MVP s/p repair, TIA in 2004, hypothyroidism, depression/anxiety who was was admitted at [facility] for [place] 08/27/2019 thru 09/10/2019, fell while trying to get OOB at home and found altered with progressive  confusion, not speaking by family. Concern for aphasia and R sided weakness in the ED.   Stroke: B anterior and posterior circulation infarcts embolic secondary to unclear source - differentials include: Aflutter with RVR, cardiomyopathy with low EF, massive PE with ? PFO, hypercoagulaility from COVID infection, ? endocarditis from sepsis  CT head age indeterminate R frontal lobe infarct.   CTA head & neck no ELVO.   CT perfusion normal  MRI  Multiple small B anterior and posterior circulation infarcts. Old R frontal lobe infarct.   2D Echo RV severely reduced systolic fxn, severe increase in size. LV EF 35%. MV repair appears intact.  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 Can consider transition to [place] if no further procedure planned. However, not sure if he needs trach.   Therapy recommendations:  pending   Disposition:  pending   Code Status - limited code  Acute Hypoxemic Respiratory Failure COVID-19 Infection Massive PE  Positive test 08/27/2019   [facility] for COVID 08/27/2019 thru 09/10/2019  Home O2 increased from 2L->4L  On hydrocortisone taper at home  Now Intubated  Persistent pneumonia, with interval improvement by chest CT  09/22/2019  Vancomycin started 09/30/2019>>  CCM on board  Shock - septic vs PE with R heart strain vs cardiogenic ? MRSA endocarditis MRSA PNA, MRSA UTI  Elevated WBC - 13.5->15.3->19.8->14.0->20.5   Afebrile now  Hypotensive - off pressor now   Thrombocytopenia, improving - 56->84->90->116->111->160   ID on board, now on 6-week IV vanco  CCM on board  NSTEMI Acute HF w/ EF 35% - ? COVID Myocarditis Atrial Flutter w/ RVR Bradycardia, resolved  Cardiology on board  TEE unable to perform d/t hx alchalasia  2D echo with Definity shows no LV thrombus  On heparin IV and ASA 81  Massive PE  CTA Chest -  Acute BILATERAL pulmonary emboli. Overall clot burden is moderate.   Heparin IV per pharmacy  DVT neg x 2  TCD bubble study - may consider once stable  Hyperlipidemia  Home meds:   No statin d/t intolerance  LDL 90, goal < 70  May Consider [facility] as outpt   Aseptic Meningitis  CSF WBC 36, RBC 78, 97% neu, pro 48, glu 97  Likely nonbacterial   time course not in favor of Covid meningoencephalitis as he tested positive 4 weeks ago.   Supportive care  Hyperglycemia   A1C 6.8  Hyperglycemia  On lantus  SSI  CBG monitoring  Other Stroke Risk Factors  Advanced age  Former Cigarette smoker, quit 22 yrs ago  Hx stroke/TIA  10/2002 - TIA w/ transient R HH   Family hx stroke (father)  Coronary artery disease  MV stenosis s/p repair  Other Active Problems  AKI / NGMA - creatinine - 1.28->1.21->1.25->1.08->1.09    Hypothyroidism    Anxiety/pain on lexapro  Aortic Atherosclerosis (ICD10-I70.0)   Emphysema (ICD10-J43.9).   Severe esophageal stenosis, hx alchalasia  Hypophosphatemia, replaced  Hospital day # 7  This patient is critically ill and at significant risk of neurological worsening, death and care requires constant monitoring of vital signs, hemodynamics,respiratory  and cardiac monitoring, extensive review of multiple  databases, frequent neurological assessment, discussion with family, other specialists and medical decision making of high complexity. I spent 35 minutes of neurocritical care time  in the care of  this patient. I had long discussion with wife at bedside, updated pt current condition, treatment plan and potential prognosis, and answered all the questions. Wife expressed understanding and appreciation.    Rosalin Hawking, MD PhD Stroke Neurology 09/25/2019 10:59 AM     To contact Stroke Continuity provider, please refer to [email or web address] After hours, contact General Neurology

## 2019-09-25 NOTE — Telephone Encounter (Signed)
Chris Lewis with [employer] and states that the patient was admitted to the hospital on 10/12/2019. States that they are required to inform PCP of hospital admission.

## 2019-09-25 NOTE — Progress Notes (Signed)
ANTICOAGULATION CONSULT NOTE   Pharmacy Consult for Heparin Indication: acute pulmonary embolus 09/22/19  Patient Measurements: Height: 5\' 8"  (172.7 cm)(Per family) Weight: 187 lb 6.3 oz (85 kg) IBW/kg (Calculated) : 68.4 Heparin Dosing Weight: 85 kg  Vital Signs: Temp: 98.7 F (37.1 C) (02/09 0700) Temp Source: Axillary (02/09 0700) BP: 141/68 (02/09 0745) Pulse Rate: 95 (02/09 0800)  Labs: Recent Labs    09/22/19 1035 09/22/19 1634 09/23/19 0506 09/23/19 1039 09/24/19 0457 09/24/19 1400 09/24/19 2223 09/25/19 0032 09/25/19 0129  HGB  --    < > 12.4*  --  11.3*  --   --   --  12.7*  HCT  --   --  40.0  --  36.4*  --   --   --  40.9  PLT  --   --  116*  --  111*  --   --   --  160  HEPARINUNFRC  --    < >  --    < > 0.39 0.31  --   --  0.52  CREATININE  --   --  1.25*  --  1.08  --   --   --  1.09  TROPONINIHS 267*  --   --   --   --   --  134* 162*  --    < > = values in this interval not displayed.    Assessment: 79 year old male with submassive acute bilateral PE 09/22/19, moderate clot burden, positive for R heart strain with RV/LV 1.3.  2/4 Lower extremity dopplers are negative for DVT, repeat 2/6 pending. TTE negative for LV thrombus. TEE on hold - needs barium study.  CT Head - no hemorrhage. MRI with multiple infarcts, chronic CVA and chronic microhemorrhage.   No overt bleeding noted. Hgb is stable at 12.7, plts 162. SCr is stable.   Pt at risk for hemorrhagic conversion given septic emboli. Heparin level wnl. Heparin level this AM 0.52 at 1400 units/ hr. Repeat verification level was 1.34 but the heparin was running through a peripheral line and the nurse drew the level from the midline in the same arm. Called and asked lab to redraw a stat level from the opposite arm this level was 0.26. Given this is barely subtherapeutic and there is a lot of discordance in these levels will continue to run the heparin at its current rate of 1400 units/hr and recheck a level in 6  hours.   Goal of Therapy:  Heparin level 0.3-0.7 units/ml - Discussed with MD, will keep higher goal despite strokes due to clinical worsening and high risk of PE - try to keep ~0.5 Monitor platelets by anticoagulation protocol: Yes   Plan:  -Heparin adult 1400 units/hr -Confirmatory level at 2100 -Daily HL, CBC   Marliss Czar [facility] 09/25/2019 8:35 AM

## 2019-09-25 NOTE — Progress Notes (Signed)
[place] Progress Note Patient Name: Chris Lewis DOB: [birth year] MRN: [id number]   Date of Service  09/25/2019  HPI/Events of Note  Serum Phosphorus is 1.7.  eICU Interventions  Phosphorus 20 mmol iv x 1 ordered.        Kerry Kass Fadel Clason 09/25/2019, 12:29 AM

## 2019-09-25 NOTE — Progress Notes (Signed)
[place] for Heparin Indication: acute pulmonary embolus 09/22/19  Patient Measurements: Height: 5\' 8"  (172.7 cm)(Per family) Weight: 187 lb 6.3 oz (85 kg) IBW/kg (Calculated) : 68.4 Heparin Dosing Weight: 85 kg  Vital Signs: Temp: 99.5 F (37.5 C) (02/09 1943) Temp Source: Oral (02/09 1943) BP: 104/46 (02/09 2000) Pulse Rate: 43 (02/09 2000)  Labs: Recent Labs    09/23/19 0506 09/23/19 1039 09/24/19 0457 09/24/19 1400 09/24/19 2223 09/25/19 0032 09/25/19 0129 09/25/19 0129 09/25/19 1105 09/25/19 1437 09/25/19 2201  HGB 12.4*  --  11.3*  --   --   --  12.7*  --   --   --   --   HCT 40.0  --  36.4*  --   --   --  40.9  --   --   --   --   PLT 116*  --  111*  --   --   --  160  --   --   --   --   HEPARINUNFRC  --    < > 0.39   < >  --   --  0.52   < > 1.34* 0.26* 0.33  CREATININE 1.25*  --  1.08  --   --   --  1.09  --   --   --   --   TROPONINIHS  --   --   --   --  134* 162*  --   --   --   --   --    < > = values in this interval not displayed.    Assessment: 79 year old male with submassive acute bilateral PE 09/22/19, moderate clot burden, positive for R heart strain with RV/LV 1.3.  2/4 Lower extremity dopplers are negative for DVT, repeat 2/6 pending. TTE negative for LV thrombus. TEE on hold - needs barium study.  CT Head - no hemorrhage. MRI with multiple infarcts, chronic CVA and chronic microhemorrhage.   Hep lvl 0.33  Goal of Therapy:  Heparin level 0.3-0.7 units/ml - Discussed with MD, will keep higher goal despite strokes due to clinical worsening and high risk of PE - try to keep ~0.5 Monitor platelets by anticoagulation protocol: Yes   Plan:  -Heparin adult 1400 units/hr -Daily HL, CBC  Barth Kirks, PharmD, BCPS, BCCCP Clinical Pharmacist [phone]  Please check AMION for all [place] numbers  09/25/2019 10:46 PM

## 2019-09-25 NOTE — Progress Notes (Addendum)
Subjective:  Patient having midline placed  Antibiotics:  Anti-infectives (From admission, onward)   Start     Dose/Rate Route Frequency Ordered Stop   09/23/19 2200  vancomycin (VANCOREADY) IVPB 1250 mg/250 mL     1,250 mg 166.7 mL/hr over 90 Minutes Intravenous Every 24 hours 09/23/19 0807     09/21/19 2000  cefTRIAXone (ROCEPHIN) 2 g in sodium chloride 0.9 % 100 mL IVPB  Status:  Discontinued     2 g 200 mL/hr over 30 Minutes Intravenous Every 12 hours 09/21/19 1137 09/23/19 0808   09/19/19 1000  ceFEPIme (MAXIPIME) 2 g in sodium chloride 0.9 % 100 mL IVPB  Status:  Discontinued     2 g 200 mL/hr over 30 Minutes Intravenous Every 12 hours 09/19/19 0113 09/21/19 1137   09/19/19 0200  metroNIDAZOLE (FLAGYL) IVPB 500 mg  Status:  Discontinued     500 mg 100 mL/hr over 60 Minutes Intravenous Every 8 hours 09/19/19 0113 09/21/19 1137   09/19/19 0130  ampicillin (OMNIPEN) 2 g in sodium chloride 0.9 % 100 mL IVPB  Status:  Discontinued     2 g 300 mL/hr over 20 Minutes Intravenous Every 6 hours 09/19/19 0113 09/21/19 1137   09/19/19 0015  piperacillin-tazobactam (ZOSYN) IVPB 3.375 g  Status:  Discontinued     3.375 g 12.5 mL/hr over 240 Minutes Intravenous Every 8 hours 09/19/19 0001 09/19/19 0115   10/12/2019 2315  vancomycin (VANCOCIN) IVPB 1000 mg/200 mL premix  Status:  Discontinued     1,000 mg 200 mL/hr over 60 Minutes Intravenous Every 24 hours 09/21/2019 2303 09/23/19 0807   10/07/2019 2200  ceFEPIme (MAXIPIME) 2 g in sodium chloride 0.9 % 100 mL IVPB  Status:  Discontinued     2 g 200 mL/hr over 30 Minutes Intravenous Every 12 hours 09/19/2019 1656 09/19/19 0001   09/26/2019 1600  cefTRIAXone (ROCEPHIN) 1 g in sodium chloride 0.9 % 100 mL IVPB     1 g 200 mL/hr over 30 Minutes Intravenous  Once 10/13/2019 1548 10/13/2019 1639      Medications: Scheduled Meds: . aspirin  81 mg Per Tube Daily  . chlorhexidine gluconate (MEDLINE KIT)  15 mL Mouth Rinse BID  . Chlorhexidine  Gluconate Cloth  6 each Topical Daily  . escitalopram  10 mg Per Tube Daily  . feeding supplement (PRO-STAT SUGAR FREE 64)  30 mL Per Tube BID  . free water  400 mL Per Tube Q4H  . insulin aspart  0-15 Units Subcutaneous Q4H  . insulin aspart  3 Units Subcutaneous Q6H  . insulin glargine  10 Units Subcutaneous BID  . ipratropium-albuterol  3 mL Nebulization Q6H  . levothyroxine  25 mcg Per Tube Q0600  . mouth rinse  15 mL Mouth Rinse 10 times per day  . pantoprazole sodium  40 mg Per Tube Daily  . sodium chloride flush  10-40 mL Intracatheter Q12H   Continuous Infusions: . sodium chloride Stopped (09/25/19 0932)  . dexmedetomidine (PRECEDEX) IV infusion 0.8 mcg/kg/hr (09/25/19 1000)  . feeding supplement (VITAL 1.5 CAL) 1,000 mL (09/24/19 1949)  . fentaNYL infusion INTRAVENOUS 125 mcg/hr (09/25/19 0800)  . heparin 1,400 Units/hr (09/25/19 1000)  . phenylephrine (NEO-SYNEPHRINE) Adult infusion Stopped (09/25/19 0816)  . vancomycin Stopped (09/25/19 0000)   PRN Meds:.sodium chloride, artificial tears, fentaNYL, fentaNYL (SUBLIMAZE) injection, metoprolol tartrate, midazolam, sodium chloride flush    Objective: Weight change: -3.9 kg  Intake/Output Summary (Last 24 hours) at  09/25/2019 1107 Last data filed at 09/25/2019 1000 Gross per 24 hour  Intake 3783.47 ml  Output 2465 ml  Net 1318.47 ml   Blood pressure (!) 86/39, pulse 73, temperature 98.7 F (37.1 C), temperature source Axillary, resp. rate (!) 22, height 5' 8"  (1.727 m), weight 85 kg, SpO2 91 %. Temp:  [97.4 F (36.3 C)-98.8 F (37.1 C)] 98.7 F (37.1 C) (02/09 0700) Pulse Rate:  [35-108] 73 (02/09 1045) Resp:  [7-41] 22 (02/09 1045) BP: (77-141)/(39-95) 86/39 (02/09 1000) SpO2:  [90 %-100 %] 91 % (02/09 1045) FiO2 (%):  [40 %] 40 % (02/09 0916) Weight:  [85 kg] 85 kg (02/09 0530)  Physical Exam: General: intubated on vent Having midline placed   CBC:    BMET Recent Labs    09/24/19 0457 09/25/19 0129    NA 143 150*  K 3.9 3.8  CL 109 116*  CO2 25 28  GLUCOSE 230* 142*  BUN 55* 55*  CREATININE 1.08 1.09  CALCIUM 7.1* 7.6*     Liver Panel  No results for input(s): PROT, ALBUMIN, AST, ALT, ALKPHOS, BILITOT, BILIDIR, IBILI in the last 72 hours.     Sedimentation Rate No results for input(s): ESRSEDRATE in the last 72 hours. C-Reactive Protein No results for input(s): CRP in the last 72 hours.  Micro Results: Recent Results (from the past 720 hour(s))  Blood Culture (routine x 2)     Status: None   Collection Time: 08/27/19 12:17 AM   Specimen: BLOOD RIGHT FOREARM  Result Value Ref Range Status   Specimen Description BLOOD RIGHT FOREARM  Final   Special Requests   Final    BOTTLES DRAWN AEROBIC AND ANAEROBIC Blood Culture adequate volume   Culture   Final    NO GROWTH 5 DAYS Performed at [facility], [address] [place], [place] [address]    Report Status 09/02/2019 FINAL  Final  Blood Culture (routine x 2)     Status: None   Collection Time: 08/27/19 10:17 PM   Specimen: BLOOD  Result Value Ref Range Status   Specimen Description BLOOD LEFT ANTECUBITAL  Final   Special Requests   Final    BOTTLES DRAWN AEROBIC AND ANAEROBIC Blood Culture adequate volume   Culture   Final    NO GROWTH 5 DAYS Performed at [facility], [place] [address], [place], [place] [address]    Report Status 09/01/2019 FINAL  Final  Urine culture     Status: Abnormal   Collection Time: 09/20/2019  3:43 PM   Specimen: Urine, Random  Result Value Ref Range Status   Specimen Description URINE, RANDOM  Final   Special Requests   Final    NONE Performed at [facility], [place] [address], [place], [place] [address]    Culture (A)  Final    >=100,000 COLONIES/mL METHICILLIN RESISTANT STAPHYLOCOCCUS AUREUS   Report Status 09/20/2019 FINAL  Final   Organism ID, Bacteria METHICILLIN RESISTANT STAPHYLOCOCCUS AUREUS (A)  Final      Susceptibility   Methicillin resistant  staphylococcus aureus - MIC*    CIPROFLOXACIN <=0.5 SENSITIVE Sensitive     GENTAMICIN <=0.5 SENSITIVE Sensitive     NITROFURANTOIN <=16 SENSITIVE Sensitive     OXACILLIN >=4 RESISTANT Resistant     TETRACYCLINE <=1 SENSITIVE Sensitive     VANCOMYCIN 1 SENSITIVE Sensitive     TRIMETH/SULFA <=10 SENSITIVE Sensitive     CLINDAMYCIN <=0.25 SENSITIVE Sensitive  RIFAMPIN <=0.5 SENSITIVE Sensitive     Inducible Clindamycin NEGATIVE Sensitive     * >=100,000 COLONIES/mL METHICILLIN RESISTANT STAPHYLOCOCCUS AUREUS  Blood Culture (routine x 2)     Status: None   Collection Time: 09/30/2019  6:31 PM   Specimen: BLOOD LEFT ARM  Result Value Ref Range Status   Specimen Description BLOOD LEFT ARM  Final   Special Requests   Final    BOTTLES DRAWN AEROBIC ONLY Blood Culture adequate volume   Culture   Final    NO GROWTH 5 DAYS Performed at [facility], [address] [place], [place] [address]    Report Status 09/23/2019 FINAL  Final  Blood Culture (routine x 2)     Status: None   Collection Time: 09/25/2019  6:31 PM   Specimen: BLOOD LEFT ARM  Result Value Ref Range Status   Specimen Description BLOOD LEFT ARM  Final   Special Requests   Final    BOTTLES DRAWN AEROBIC ONLY Blood Culture results may not be optimal due to an inadequate volume of blood received in culture bottles   Culture   Final    NO GROWTH 5 DAYS Performed at [facility], [place] [address] [place], [place] [address]    Report Status 09/23/2019 FINAL  Final  MRSA PCR Screening     Status: Abnormal   Collection Time: 09/19/19  2:34 AM   Specimen: Nasal Mucosa; Nasopharyngeal  Result Value Ref Range Status   MRSA by PCR POSITIVE (A) NEGATIVE Final    Comment: CRITICAL RESULT CALLED TO, READ BACK BY AND VERIFIED WITH: RN Mammie Russian [id number] @ [phone] [facility] Performed at [facility], [place] [address], [place], [place] [address]   Fungus Culture With Stain     Status: None (Preliminary result)   Collection  Time: 09/19/19  9:22 AM  Result Value Ref Range Status   Fungus Stain Final report  Final    Comment: (NOTE) Performed At: [facility] [place], [place] [id number] Rush Farmer MD [id number]    Fungus (Mycology) Culture PENDING  Incomplete   Fungal Source CSF  Final    Comment: Performed at [facility], [place] [address], [place], [place] [address]  CSF culture     Status: None (Preliminary result)   Collection Time: 09/19/19  9:22 AM   Specimen: CSF; Cerebrospinal Fluid  Result Value Ref Range Status   Specimen Description CSF  Final   Special Requests NONE  Final   Gram Stain   Final    WBC PRESENT, PREDOMINANTLY PMN NO ORGANISMS SEEN CYTOSPIN SMEAR    Culture   Final    NO GROWTH 2 DAYS Performed at [facility], [place] [address], [place], [place] [address]    Report Status PENDING  Incomplete  Fungus Culture Result     Status: None   Collection Time: 09/19/19  9:22 AM  Result Value Ref Range Status   Result 1 Comment  Final    Comment: (NOTE) KOH/Calcofluor preparation:  no fungus observed. Performed At: [facility] [place], [place] [id number] Rush Farmer MD [id number]   Culture, respiratory (non-expectorated)     Status: None   Collection Time: 09/21/19  4:08 AM   Specimen: Tracheal Aspirate; Respiratory  Result Value Ref Range Status   Specimen Description TRACHEAL ASPIRATE  Final   Special Requests NONE  Final   Gram Stain   Final  MODERATE WBC PRESENT, PREDOMINANTLY PMN FEW GRAM POSITIVE COCCI IN PAIRS RARE YEAST Performed at [facility], [place] [address], [place], [place] [address]    Culture   Final    RARE METHICILLIN RESISTANT STAPHYLOCOCCUS AUREUS FEW CANDIDA ALBICANS    Report Status 09/23/2019 FINAL  Final   Organism ID, Bacteria METHICILLIN RESISTANT STAPHYLOCOCCUS AUREUS  Final      Susceptibility   Methicillin resistant staphylococcus aureus - MIC*    CIPROFLOXACIN <=0.5  SENSITIVE Sensitive     ERYTHROMYCIN >=8 RESISTANT Resistant     GENTAMICIN <=0.5 SENSITIVE Sensitive     OXACILLIN >=4 RESISTANT Resistant     TETRACYCLINE <=1 SENSITIVE Sensitive     VANCOMYCIN <=0.5 SENSITIVE Sensitive     TRIMETH/SULFA <=10 SENSITIVE Sensitive     CLINDAMYCIN <=0.25 SENSITIVE Sensitive     RIFAMPIN <=0.5 SENSITIVE Sensitive     Inducible Clindamycin NEGATIVE Sensitive     * RARE METHICILLIN RESISTANT STAPHYLOCOCCUS AUREUS  Culture, blood (routine x 2)     Status: None (Preliminary result)   Collection Time: 09/24/19  9:35 AM   Specimen: BLOOD RIGHT HAND  Result Value Ref Range Status   Specimen Description BLOOD RIGHT HAND  Final   Special Requests   Final    BOTTLES DRAWN AEROBIC AND ANAEROBIC Blood Culture adequate volume   Culture   Final    NO GROWTH < 24 HOURS Performed at [facility], [address] [place], [place] [address]    Report Status PENDING  Incomplete  Culture, blood (routine x 2)     Status: None (Preliminary result)   Collection Time: 09/24/19  9:44 AM   Specimen: BLOOD LEFT HAND  Result Value Ref Range Status   Specimen Description BLOOD LEFT HAND  Final   Special Requests   Final    BOTTLES DRAWN AEROBIC AND ANAEROBIC Blood Culture adequate volume   Culture   Final    NO GROWTH < 24 HOURS Performed at [facility], [place] [address] [place], [place] [address]    Report Status PENDING  Incomplete    Studies/Results: DG Chest Port 1 View  Result Date: 09/24/2019 CLINICAL DATA:  F/u Covid +, they are aware the central line is not in correct position, they have pulled back, and will re do another time, per rn EXAM: PORTABLE CHEST 1 VIEW COMPARISON:  Radiograph 09/22/2019 FINDINGS: Endotracheal tube, NG tube and central venous line unchanged. Central venous line remains in the brachycephalic vein. Normal cardiac silhouette. There is bibasilar patchy airspace disease not changed from prior. No pneumothorax. IMPRESSION: 1. Stable  support apparatus. 2. Patchy bibasilar airspace disease again noted. Electronically Signed   By: Suzy Bouchard M.D.   On: 09/24/2019 10:37   VAS [place] LOWER EXTREMITY ARTERIAL DUPLEX  Result Date: 09/24/2019 LOWER EXTREMITY ARTERIAL DUPLEX STUDY Indications: Pallor. Cool extremities. COVID-19 positive.  Current ABI: Not obtained Limitations: Patient position Comparison Study: No prior study Performing Technologist: Maudry Mayhew MHA, RDMS, RVT, RDCS  Examination Guidelines: A complete evaluation includes B-mode imaging, spectral Doppler, color Doppler, and power Doppler as needed of all accessible portions of each vessel. Bilateral testing is considered an integral part of a complete examination. Limited examinations for reoccurring indications may be performed as noted.  +----------+--------+-----+--------+---------+--------+ RIGHT     PSV cm/sRatioStenosisWaveform Comments +----------+--------+-----+--------+---------+--------+ CFA Distal52                   triphasic         +----------+--------+-----+--------+---------+--------+  DFA       33                   triphasic         +----------+--------+-----+--------+---------+--------+ SFA Prox  75                   triphasic         +----------+--------+-----+--------+---------+--------+ SFA Mid   53                   triphasic         +----------+--------+-----+--------+---------+--------+ SFA Distal59                   triphasic         +----------+--------+-----+--------+---------+--------+ POP Prox  58                   triphasic         +----------+--------+-----+--------+---------+--------+ POP Distal78                   triphasic         +----------+--------+-----+--------+---------+--------+ ATA Distal67                   triphasic         +----------+--------+-----+--------+---------+--------+ PTA Prox  87                   triphasic          +----------+--------+-----+--------+---------+--------+ PTA Distal83                   triphasic         +----------+--------+-----+--------+---------+--------+ PERO Mid  90                   triphasic         +----------+--------+-----+--------+---------+--------+ DP        43                   triphasic         +----------+--------+-----+--------+---------+--------+  +-----------+--------+-----+--------+---------+--------+ LEFT       PSV cm/sRatioStenosisWaveform Comments +-----------+--------+-----+--------+---------+--------+ CFA Distal 48                   triphasic         +-----------+--------+-----+--------+---------+--------+ DFA        45                   triphasic         +-----------+--------+-----+--------+---------+--------+ SFA Prox   60                   triphasic         +-----------+--------+-----+--------+---------+--------+ SFA Mid    139                  triphasic         +-----------+--------+-----+--------+---------+--------+ SFA Distal 55                   triphasic         +-----------+--------+-----+--------+---------+--------+ POP Prox   39                   triphasic         +-----------+--------+-----+--------+---------+--------+ POP Distal 46                   triphasic         +-----------+--------+-----+--------+---------+--------+ ATA Distal 20  triphasic         +-----------+--------+-----+--------+---------+--------+ PTA Distal 94                   triphasic         +-----------+--------+-----+--------+---------+--------+ PERO Distal38                   triphasic         +-----------+--------+-----+--------+---------+--------+ DP         48                   triphasic         +-----------+--------+-----+--------+---------+--------+   Summary: Bilateral: No evidence of arterial occlusive disease.  Incidental finding: sonographic evidence of Baker's cyst in the  left popliteal fossa. See table(s) above for measurements and observations. Electronically signed by Monica Martinez MD on 09/24/2019 at 4:20:02 PM.    Final       Assessment/Plan:  INTERVAL HISTORY: transferred to 71M   Active Problems:   Pneumonia   Pressure injury of skin   Cerebral embolism with cerebral infarction   Acute respiratory failure ([place])    Chris Lewis is a 79 y.o. male with multiple medical problems including mitral regurgitation status post mitral valve repair right bundle branch block COPD admitted to [facility] in January Covid pneumonia then readmitted to [facility] with altered mental status and fevers now with embolic strokes thought to be due to occult Staph aureus bacteremia and left-sided endocarditis.  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   Chris Lewis has an appointment on 11/07/2019 at 1015 AM with Dr. Tommy Medal  The [facility] is located in the [facility] at  [place] in [place].  [address], which is located to the left of the elevators.  Phone: [phone]  Fax: [phone]  [email or web address]  He should arrive 15 minutes prior to his appointment   LOS: 7 days   Alcide Evener 09/25/2019, 11:07 AM

## 2019-09-25 NOTE — Progress Notes (Signed)
NAME:  Chris Lewis, MRN:  [id number], DOB:  1940/08/30, LOS: 7 ADMISSION DATE:  09/27/2019, CONSULTATION DATE:  10/14/2019 REFERRING MD:  Alvino Chapel, CHIEF COMPLAINT:  AMS, fall  Brief History   79 year old man with a history of COPD (emphysema seen on CT scan), Achalasia, anxiety/depression, HLD, hypothyroidism, RBBB, MR s/p MV repair, Recently hospitalized at [facility] for COVID-19 PNA (1/12-1/25) now here with AMS. Per wife he fell overnight while trying to get out of bed on his own.  AM of admit was found altered and not speaking by his family.    Past Medical History  Achalasia, anxiety/depression, HLD, hypothyroidism, RBBB, MR s/p MV repair, Recently hospitalized at [facility] for covid PNA (1/12-1/25)   Significant Hospital Events   2/02 Admit  2/05 through 2/8: 2/5: Weaning sedation, weaning epinephrine.  Bradycardic on Precedex.  Atrial flutter on telemetry.  Heart rate as high as 160s on norepinephrine, changed to phenylephrine  2/07 requiring metoprolol for rapid heart rhythm.  Digoxin loaded.  2/7 improved neurologically. 2/08 Infectious disease consulted, Lantus increased, renal function normalized, white blood cell count trending down, hemoglobin stable.  Off pressors still tachycardic, tachypneic.  Added Lopressor scheduled via tube, resuming Precedex, discontinuing stress dose steroids 2/09 change to pressure control  Consults:  Neurology  Infectious disease consulted 2/8   Procedures:  ETT 2/2 >>  CVC (backed out but still in innominate vein on CXR)   Significant Diagnostic Tests:  CT Head 2/2 >> motion degraded study, age-indeterminate infarct involving the R frontal lobe CTA Head / Neck / Perfusion 2/2 >> no emergent large vessel occlusion, normal cerebral CT perfusion EEG 2/03 >> continuous slowing MRI brain 2/03 >> multiple small acute infarcts b/l in anterior and posterior circulations Echo 2/05 >> dilated RV with reduced systolic function and evidence of overload, LVEF 35 to 40%  with global hypokinesis CT angio chest 2/06 >> b/l PE, RV:LV 1.3, atherosclerosis, centrilobular emphysema, basilar ASD  Micro Data:  MRSA PCR 2/3 >> positive  UC 2/2 >> MRSA 100k CSF 2/4 >> NG final BCx2 2/2 >> NG final  resp cx 2/5>> MRSA (R erythromycin), few candida albicans  Antimicrobials:  Ampicillin 2/2 >> 2/5 Cefepime 2/2 >> 2/5 Ceftriaxone 2/2 >> 2/4 Flagyl 2/2 >> 2/5 Vancomycin 2/2>>>  Interim history/subjective:  Uncomfortable on vent.  Bradycardia with precedex overnight.  Objective   Blood pressure (!) 141/68, pulse 95, temperature 98.7 F (37.1 C), temperature source Axillary, resp. rate (!) 34, height 5\' 8"  (1.727 m), weight 85 kg, SpO2 93 %.    Vent Mode: CPAP;PSV FiO2 (%):  [40 %] 40 % Set Rate:  [20 bmp] 20 bmp Vt Set:  [540 mL] 540 mL PEEP:  [5 cmH20] 5 cmH20 Pressure Support:  [5 cmH20] 5 cmH20 Plateau Pressure:  [16 cmH20] 16 cmH20   Intake/Output Summary (Last 24 hours) at 09/25/2019 0834 Last data filed at 09/25/2019 0800 Gross per 24 hour  Intake 3497.61 ml  Output 2340 ml  Net 1157.61 ml   Filed Weights   09/23/19 0500 09/24/19 0500 09/25/19 0530  Weight: 86.3 kg 88.9 kg 85 kg    Examination:  General - sedated Eyes - pupils reactive ENT - ETT in place Cardiac - regular rate/rhythm, no murmur Chest - prolonged exhalation Abdomen - soft, non tender, + bowel sounds Extremities - no cyanosis, clubbing, or edema Skin - no rashes Neuro - RASS -2   Resolved Hospital Problem list   None anion gap metabolic acidosis, Acute kidney  failure, creatinine normalized on 2/8, Relative adrenal insufficiency, thrombocytopenia from sepsis  Assessment & Plan:   Acute hypoxic respiratory failure from MRSA HCAP with hx of severe COPD/emphysema and COVID 19 pneumonia. - change to pressure control 2/09 - f/u CXR - scheduled BDs  Acute pulmonary embolism with cor pulmonale in setting of recent COVID 19 infection. - pressors to keep MAP > 65 - continue  heparin gtt for now  Bilateral anterior and posterior circulation infarcts likely from septic emboli. - continue ASA - defer to neurology when he can safely transition from heparin gtt to enteral anticoagulation  Acute metabolic encephalopathy 2nd to sepsis. Hx of anxiety. 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 A flutter with RVR new onset >> back in sinus rhythm. Bradycardia from precedex. S/p MVR. - monitor heart rhythm - hold lopressor for now  Hypernatremia. - free water  Hx of hypothyroidism. - continue synthroid  Hyperglycemia. - SSI with lantus   Best practice:  Diet: tube feeds DVT prophylaxis: heparin GI prophylaxis: protonix  Glucose control: SSI Mobility: BR Disposition: ICU  Labs:   CMP Latest Ref Rng & Units 09/25/2019 09/24/2019 09/23/2019  Glucose 70 - 99 mg/dL 142(H) 230(H) 218(H)  BUN 8 - 23 mg/dL 55(H) 55(H) 52(H)  Creatinine 0.61 - 1.24 mg/dL 1.09 1.08 1.25(H)  Sodium 135 - 145 mmol/L 150(H) 143 146(H)  Potassium 3.5 - 5.1 mmol/L 3.8 3.9 4.1  Chloride 98 - 111 mmol/L 116(H) 109 113(H)  CO2 22 - 32 mmol/L 28 25 26   Calcium 8.9 - 10.3 mg/dL 7.6(L) 7.1(L) 7.4(L)  Total Protein 6.5 - 8.1 g/dL - - -  Total Bilirubin 0.3 - 1.2 mg/dL - - -  Alkaline Phos 38 - 126 U/L - - -  AST 15 - 41 U/L - - -  ALT 0 - 44 U/L - - -    CBC Latest Ref Rng & Units 09/25/2019 09/24/2019 09/23/2019  WBC 4.0 - 10.5 K/uL 20.5(H) 14.0(H) 19.8(H)  Hemoglobin 13.0 - 17.0 g/dL 12.7(L) 11.3(L) 12.4(L)  Hematocrit 39.0 - 52.0 % 40.9 36.4(L) 40.0  Platelets 150 - 400 K/uL 160 111(L) 116(L)    ABG    Component Value Date/Time   PHART 7.363 09/21/2019 1128   PCO2ART 36.6 09/21/2019 1128   PO2ART 75.0 (L) 09/21/2019 1128   HCO3 20.7 09/21/2019 1128   TCO2 22 09/21/2019 1128   ACIDBASEDEF 4.0  (H) 09/21/2019 1128   O2SAT 94.0 09/21/2019 1128    CBG (last 3)  Recent Labs    09/24/19 2311 09/25/19 0309 09/25/19 0722  GLUCAP 168* 129* 121*    CC time 34 minutes  Chesley Mires, MD [place] 09/25/2019, 8:50 AM

## 2019-09-25 NOTE — Progress Notes (Signed)
PHARMACY CONSULT NOTE FOR:  OUTPATIENT  PARENTERAL ANTIBIOTIC THERAPY (OPAT)  Indication: Endocarditis  Regimen:  Vancomycin 1250mg  IV q24h End date: 10/31/2019  IV antibiotic discharge orders are pended. To discharging provider:  please sign these orders via discharge navigator,  Select New Orders & click on the button choice - Manage This Unsigned Work.     Thank you for allowing pharmacy to be a part of this patient's care.  Cristela Felt, PharmD PGY1 Pharmacy Resident [employer]: [phone]  09/25/2019, 12:09 PM

## 2019-09-25 NOTE — Progress Notes (Signed)
[facility] Progress Note Patient Name: MATTOX BUSCAGLIA DOB: 1941/03/08 MRN: [id number]   Date of Service  09/25/2019  HPI/Events of Note  Serum Sodium 150 meq, Pt is receiving 400 ml of free water Q 4 hours.  eICU Interventions  No additional orders.        Kerry Kass Aymee Fomby 09/25/2019, 2:58 AM

## 2019-09-26 ENCOUNTER — Inpatient Hospital Stay (HOSPITAL_COMMUNITY): Payer: BC Managed Care – PPO

## 2019-09-26 DIAGNOSIS — U071 COVID-19: Secondary | ICD-10-CM | POA: Diagnosis not present

## 2019-09-26 DIAGNOSIS — R509 Fever, unspecified: Secondary | ICD-10-CM | POA: Diagnosis not present

## 2019-09-26 DIAGNOSIS — J9601 Acute respiratory failure with hypoxia: Secondary | ICD-10-CM | POA: Diagnosis not present

## 2019-09-26 DIAGNOSIS — J189 Pneumonia, unspecified organism: Secondary | ICD-10-CM | POA: Diagnosis not present

## 2019-09-26 DIAGNOSIS — I6340 Cerebral infarction due to embolism of unspecified cerebral artery: Secondary | ICD-10-CM | POA: Diagnosis not present

## 2019-09-26 DIAGNOSIS — J9690 Respiratory failure, unspecified, unspecified whether with hypoxia or hypercapnia: Secondary | ICD-10-CM | POA: Diagnosis not present

## 2019-09-26 LAB — CBC
HCT: 35.6 % — ABNORMAL LOW (ref 39.0–52.0)
Hemoglobin: 11.1 g/dL — ABNORMAL LOW (ref 13.0–17.0)
MCH: 30.9 pg (ref 26.0–34.0)
MCHC: 31.2 g/dL (ref 30.0–36.0)
MCV: 99.2 fL (ref 80.0–100.0)
Platelets: 140 10*3/uL — ABNORMAL LOW (ref 150–400)
RBC: 3.59 MIL/uL — ABNORMAL LOW (ref 4.22–5.81)
RDW: 17.8 % — ABNORMAL HIGH (ref 11.5–15.5)
WBC: 13.8 10*3/uL — ABNORMAL HIGH (ref 4.0–10.5)
nRBC: 0.4 % — ABNORMAL HIGH (ref 0.0–0.2)

## 2019-09-26 LAB — BASIC METABOLIC PANEL
Anion gap: 9 (ref 5–15)
BUN: 53 mg/dL — ABNORMAL HIGH (ref 8–23)
CO2: 24 mmol/L (ref 22–32)
Calcium: 7.2 mg/dL — ABNORMAL LOW (ref 8.9–10.3)
Chloride: 113 mmol/L — ABNORMAL HIGH (ref 98–111)
Creatinine, Ser: 1.25 mg/dL — ABNORMAL HIGH (ref 0.61–1.24)
GFR calc Af Amer: >60 mL/min (ref >60)
GFR calc non Af Amer: 54 mL/min — ABNORMAL LOW (ref >60)
Glucose, Bld: 118 mg/dL — ABNORMAL HIGH (ref 70–99)
Potassium: 3.9 mmol/L (ref 3.5–5.1)
Sodium: 146 mmol/L — ABNORMAL HIGH (ref 135–145)

## 2019-09-26 LAB — HEPARIN LEVEL (UNFRACTIONATED)
Heparin Unfractionated: 1.94 IU/mL — ABNORMAL HIGH (ref 0.30–0.70)
Heparin Unfractionated: 0.51 IU/mL (ref 0.30–0.70)

## 2019-09-26 LAB — GLUCOSE, CAPILLARY
Glucose-Capillary: 127 mg/dL — ABNORMAL HIGH (ref 70–99)
Glucose-Capillary: 120 mg/dL — ABNORMAL HIGH (ref 70–99)
Glucose-Capillary: 138 mg/dL — ABNORMAL HIGH (ref 70–99)
Glucose-Capillary: 111 mg/dL — ABNORMAL HIGH (ref 70–99)
Glucose-Capillary: 126 mg/dL — ABNORMAL HIGH (ref 70–99)
Glucose-Capillary: 107 mg/dL — ABNORMAL HIGH (ref 70–99)

## 2019-09-26 NOTE — Progress Notes (Addendum)
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 Per wife he fell overnight while trying to get out of bed on his own.  AM of admit was found altered and not speaking by his family.    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  Bradycardic on Precedex.  Atrial flutter on telemetry.  Heart rate as high as 160s on norepinephrine, changed to phenylephrine  2/07 requiring metoprolol for rapid heart rhythm.  Digoxin loaded.  2/7 improved neurologically. 2/08 Infectious disease consulted, Lantus increased, renal function normalized, white blood cell count trending down, hemoglobin stable.  Off pressors still tachycardic, tachypneic.  Added Lopressor scheduled via tube, resuming Precedex, discontinuing stress dose steroids 2/09 change to pressure control  Consults:  Neurology  Infectious disease consulted 2/8   Procedures:  ETT 2/2 >>  CVC (backed out but still in innominate vein on CXR)   Significant Diagnostic Tests:  CT Head 2/2 >> motion degraded study, age-indeterminate infarct involving the R frontal lobe CTA Head / Neck / Perfusion 2/2 >> no emergent large vessel occlusion, normal cerebral CT perfusion EEG 2/03 >> continuous slowing MRI brain 2/03 >> multiple small acute infarcts b/l in anterior and posterior circulations Echo 2/05 >> dilated RV with reduced systolic function and evidence of overload, LVEF 35 to 40%  with global hypokinesis CT angio chest 2/06 >> b/l PE, RV:LV 1.3, atherosclerosis, centrilobular emphysema, basilar ASD  Micro Data:  MRSA PCR 2/3 >> positive  UC 2/2 >> MRSA 100k CSF 2/4 >> NG final BCx2 2/2 >> NG final  resp cx 2/5>> MRSA (R erythromycin), few candida albicans  Antimicrobials:  Ampicillin 2/2 >> 2/5 Cefepime 2/2 >> 2/5 Ceftriaxone 2/2 >> 2/4 Flagyl 2/2 >> 2/5 Vancomycin 2/2>>>  Interim history/subjective:  2/10:  No acute events overnight. Remains vented and sedated.  2/9:Uncomfortable on vent.  Bradycardia with precedex overnight.  Objective   Blood pressure (!) 109/49, pulse (!) 59, temperature 97.7 F (36.5 C), temperature source Axillary, resp. rate (!) 22, height 5\' 8"  (1.727 m), weight 90.4 kg, SpO2 98 %.    Vent Mode: PCV FiO2 (%):  [40 %] 40 % Set Rate:  [18 bmp] 18 bmp PEEP:  [5 cmH20] 5 cmH20 Plateau Pressure:  [19 cmH20-23 cmH20] 22 cmH20   Intake/Output Summary (Last 24 hours) at 09/26/2019 [id number] Last data filed at 09/26/2019 [id number] Gross per 24 hour  Intake 4113.63 ml  Output 2565 ml  Net 1548.63 ml   Filed Weights   09/24/19 0500 09/25/19 0530 09/26/19 0500  Weight: 88.9 kg 85 kg 90.4 kg    Examination:  General - sedated, unresponsive Eyes - pupils reactive ENT - ETT in place Cardiac - regular rate/rhythm, no murmur Chest - prolonged exhalation Abdomen - soft, non tender, + bowel sounds Extremities - no cyanosis, clubbing, or edema Skin - no rashes Neuro - RASS -2   Resolved Hospital Problem list   None anion gap metabolic acidosis, Acute kidney  failure, creatinine normalized on 2/8, Relative adrenal insufficiency, thrombocytopenia from sepsis  Assessment & Plan:   Acute hypoxic respiratory failure from MRSA HCAP with hx of severe COPD/emphysema and COVID 19 pneumonia. - change to pressure control 2/09 - f/u CXR - scheduled BDs  Acute pulmonary embolism with cor pulmonale in setting of recent COVID 19 infection. -  pressors to keep MAP > 65 - continue heparin gtt for now  Bilateral anterior and posterior circulation infarcts likely from septic emboli. - continue ASA - per neuro can transition to oral a/c pending procedures.   Thrombocytopenia -Relatively stable -Follow -No overt bleeding Normocytic anemia:  -relatively stable -follow trend  Acute metabolic encephalopathy 2nd to sepsis and recent cva Hx of anxiety. - RASS goal 0 to -1  - restart lexapro -wean sedation to assess mental status to understand better of needs trach.   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 A flutter with RVR new onset >> back in sinus rhythm. Bradycardia from precedex. S/p MVR. - monitor heart rhythm - hold lopressor for now  Hypernatremia. - free water -improving.  Hx of hypothyroidism. - continue synthroid  Hyperglycemia. - SSI with lantus   Best practice:  Diet: tube feeds DVT prophylaxis: heparin GI prophylaxis: protonix  Glucose control: SSI Mobility: BR Disposition: ICU  Labs:   CMP Latest Ref Rng & Units 09/26/2019 09/25/2019 09/24/2019  Glucose 70 - 99 mg/dL 118(H) 142(H) 230(H)  BUN 8 - 23 mg/dL 53(H) 55(H) 55(H)  Creatinine 0.61 - 1.24 mg/dL 1.25(H) 1.09 1.08  Sodium 135 - 145 mmol/L 146(H) 150(H) 143  Potassium 3.5 - 5.1 mmol/L 3.9 3.8 3.9  Chloride 98 - 111 mmol/L 113(H) 116(H) 109  CO2 22 - 32 mmol/L 24 28 25   Calcium 8.9 - 10.3 mg/dL 7.2(L) 7.6(L) 7.1(L)  Total Protein 6.5 - 8.1 g/dL - - -  Total Bilirubin 0.3 - 1.2 mg/dL - - -  Alkaline Phos 38 - 126 U/L - - -  AST 15 - 41 U/L - - -  ALT 0 - 44 U/L - - -    CBC Latest Ref Rng & Units 09/26/2019 09/25/2019 09/24/2019  WBC 4.0 - 10.5 K/uL 13.8(H) 20.5(H) 14.0(H)  Hemoglobin 13.0 - 17.0 g/dL 11.1(L) 12.7(L) 11.3(L)  Hematocrit 39.0 - 52.0 % 35.6(L) 40.9 36.4(L)  Platelets 150 -  400 K/uL 140(L) 160 111(L)    ABG    Component Value Date/Time   PHART 7.363 09/21/2019 1128   PCO2ART 36.6 09/21/2019 1128   PO2ART 75.0 (L) 09/21/2019 1128   HCO3 20.7 09/21/2019 1128   TCO2 22 09/21/2019 1128   ACIDBASEDEF 4.0 (H) 09/21/2019 1128   O2SAT 94.0 09/21/2019 1128    CBG (last 3)  Recent Labs    09/25/19 1938 09/25/19 2333 09/26/19 0340  GLUCAP 103* 118* 127*    Critical care time: The patient is critically ill with multiple organ systems failure and requires high complexity decision making for assessment and support, frequent evaluation and titration of therapies, application of advanced monitoring technologies and extensive interpretation of multiple databases.  Critical care time 41 mins. This represents my time independent of the NPs time taking care of the pt. This is excluding procedures.    [facility] 09/26/2019, 8:08 AM

## 2019-09-26 NOTE — Progress Notes (Signed)
Initial Nutrition Assessment  DOCUMENTATION CODES:   Not applicable  INTERVENTION:   Continue TF via OG tube: Vital 1.5 at 60 ml/hr Pro-Stat 30 mL BID  TF regimen provides 2360 kcals, 127 g of protein and 1094 mL of fluid Meets 100% of estimated protein and calorie needs  Add free water flush of 200 mL q 6 hours: total of 1894 mL of free water. Monitor sodium trend, renal function  NUTRITION DIAGNOSIS:   Inadequate oral intake related to acute illness as evidenced by NPO status.  Ongoing  GOAL:   Patient will meet greater than or equal to 90% of their needs   Met with TF  MONITOR:   Vent status, TF tolerance, Labs, Weight trends  ASSESSMENT:   79 yo male admitted with acute encephalopathy, acute respiratory failure requiring intubation with recent admission for COVID 19 pneumonia from 1/12 to 1/25 with hx of COPD, severe emphysema, AKI. PMH includes HLD, depression, HTN   1/12-1/25 Admission to [place] for COVID 19 pneumonia 2/2 Admit, Intubated  Currently receiving Vital 1.5 at 60 ml/h via OG tube with Pro-stat 30 ml BID to provide 2360 kcal, 127 gm protein, 1100 ml free water daily. Free water flushes 400 ml every 4 hours for total free water 3.5 L per day.  Patient remains intubated on ventilator support, sedated on fentanyl and precedex; no longer requiring pressors. MV: 19.1 L/min Temp (24hrs), Avg:98.6 F (37 C), Min:97.5 F (36.4 C), Max:99.8 F (37.7 C)  Current weight 90.4 kg; up from 80.2 kg on admission. I/O +15 L  Labs reviewed. Na 146 (H) improved from 150 2/9 with free water flushes CBG's: 120-138 Meds: reviewed  Diet Order:   Diet Order    None      EDUCATION NEEDS:   Not appropriate for education at this time  Skin:  Skin Assessment: Skin Integrity Issues: Skin Integrity Issues:: Stage II Stage II: buttocks  Last BM:  2/10 Rectal tube  Height:   Ht Readings from Last 1 Encounters:  10/06/2019 5' 8"  (1.727 m)    Weight:   Wt  Readings from Last 1 Encounters:  09/26/19 90.4 kg    Estimated Nutritional Needs:   Kcal:  2110-2380  Protein:  120-150 g  Fluid:  >/= 2 L   Molli Barrows, RD, LDN, CNSC Contact information can be found in [place].

## 2019-09-26 NOTE — Progress Notes (Signed)
ANTICOAGULATION CONSULT NOTE   Pharmacy Consult for Heparin Indication: acute pulmonary embolus 09/22/19  Patient Measurements: Height: 5\' 8"  (172.7 cm)(Per family) Weight: 199 lb 4.7 oz (90.4 kg) IBW/kg (Calculated) : 68.4 Heparin Dosing Weight: 85 kg  Vital Signs: Temp: 97.7 F (36.5 C) (02/10 0727) Temp Source: Axillary (02/10 0727) BP: 104/51 (02/10 0848) Pulse Rate: 61 (02/10 0848)  Labs: Recent Labs     0000 09/24/19 0457 09/24/19 1400 09/24/19 2223 09/25/19 0032 09/25/19 0129 09/25/19 1105 09/25/19 2201 09/26/19 0530 09/26/19 0726  HGB   < > 11.3*  --   --   --  12.7*  --   --  11.1*  --   HCT  --  36.4*  --   --   --  40.9  --   --  35.6*  --   PLT  --  111*  --   --   --  160  --   --  140*  --   HEPARINUNFRC  --  0.39   < >  --   --  0.52   < > 0.33 1.94* 0.51  CREATININE  --  1.08  --   --   --  1.09  --   --  1.25*  --   TROPONINIHS  --   --   --  134* 162*  --   --   --   --   --    < > = values in this interval not displayed.    Assessment: 79 year old male with submassive acute bilateral PE 09/22/19, moderate clot burden, positive for R heart strain with RV/LV 1.3.  2/4 Lower extremity dopplers are negative for DVT, repeat 2/6 pending. TTE negative for LV thrombus. TEE on hold - needs barium study.  CT Head - no hemorrhage. MRI with multiple infarcts, chronic CVA and chronic microhemorrhage.   Patient heparin level drawn inaccurately this morning and came back 1.94, redrawn and was 0.51.   Goal of Therapy:  Heparin level 0.3-0.7 units/ml - Discussed with MD, will keep higher goal despite strokes due to clinical worsening and high risk of PE - try to keep ~0.5 Monitor platelets by anticoagulation protocol: Yes   Plan:  -Continue Heparin 1400 units/hr -Daily HL, CBC  Nicoletta Dress, PharmD PGY2 Infectious Disease Pharmacy Resident    Please check AMION for all [place] numbers  09/26/2019 9:46 AM

## 2019-09-26 NOTE — Progress Notes (Signed)
Pharmacy Antibiotic Note  Chris Lewis is a 79 y.o. male admitted on 09/30/2019 with altered mental status, slight interval worsening of CXR, recent COVID-19 admission. Pt was worked up for meningitis with LP, bacterial meningitis was ruled out. Also concern for endocarditis, unable to do TEE at this time so will treat for 6 weeks. Pt does have evidence of septic emboli.   VP/VT on 2/11 at [place] and 2130 these are the first levels on this dose increase. Will update OPAT as needed with any dose changes  Plan: -Continue Vancomycin 1250mg  IV every 24 hours.  -Adjust based on levels    Height: 5\' 8"  (172.7 cm)(Per family) Weight: 199 lb 4.7 oz (90.4 kg) IBW/kg (Calculated) : 68.4  Temp (24hrs), Avg:99.1 F (37.3 C), Min:97.7 F (36.5 C), Max:99.8 F (37.7 C)  Recent Labs  Lab 09/20/19 1110 09/21/19 0354 09/21/19 2022 09/22/19 0000 09/22/19 0219 09/23/19 0506 09/24/19 0457 09/25/19 0129 09/26/19 0530  WBC  --    < >  --   --  15.3* 19.8* 14.0* 20.5* 13.8*  CREATININE  --    < >  --   --  1.21 1.25* 1.08 1.09 1.25*  LATICACIDVEN 2.7*  --   --   --   --   --   --   --   --   VANCOTROUGH  --   --  10*  --   --   --   --   --   --   VANCOPEAK  --   --   --  34  --   --   --   --   --    < > = values in this interval not displayed.    Estimated Creatinine Clearance: 51.5 mL/min (A) (by C-G formula based on SCr of 1.25 mg/dL (H)).    Allergies  Allergen Reactions  . Duac [Clindamycin Phos-Benzoyl Perox] Rash  . Contrast Media [Iodinated Diagnostic Agents] Rash    Heavy rash and itching 20 yrs ago. Needs full premeds and does well with this.  . Atorvastatin Other (See Comments)    myalgias  . Propoxyphene N-Acetaminophen Itching    rash  . Rosuvastatin Other (See Comments)    myalgias  . Metrizamide Rash    radiopaque contrast agent. Heavy rash and itching 20 yrs ago. Needs full premeds and does well with this.  Orie Fisherman Vegetable Orange [Psyllium] Other (See Comments)   Heartburn   . Other Rash    High acid food--tomatoes, orange juice  . Tomato Other (See Comments)    Heartburn     Nicoletta Dress, PharmD PGY2 Infectious Disease Pharmacy Resident  Please refer to [facility] for [place] numbers 09/26/2019 9:55 AM

## 2019-09-26 NOTE — Progress Notes (Signed)
STROKE TEAM PROGRESS NOTE   INTERVAL HISTORY RN at bedside. Pt still on vent and needed sedation. He is very lethargic, able to open eyes on repetitive voice and follows limited commands just one time, not able to repeat for second round of commands. BP 90s and off pressor.   Vitals:   09/26/19 0830 09/26/19 0845 09/26/19 0847 09/26/19 0848  BP: (!) 104/51   (!) 104/51  Pulse: (!) 52 60  61  Resp: (!) 22 (!) 23  (!) 22  Temp:      TempSrc:      SpO2: 97% 97% 98% 98%  Weight:      Height:        CBC:  Recent Labs  Lab 09/25/19 0129 09/26/19 0530  WBC 20.5* 13.8*  HGB 12.7* 11.1*  HCT 40.9 35.6*  MCV 98.8 99.2  PLT 160 [id number]*   Basic Metabolic Panel:  Recent Labs  Lab 09/23/19 0506 09/24/19 0457 09/24/19 2223 09/25/19 0129 09/26/19 0530  NA 146*   < >  --  150* 146*  K 4.1   < >  --  3.8 3.9  CL 113*   < >  --  116* 113*  CO2 26   < >  --  28 24  GLUCOSE 218*   < >  --  142* 118*  BUN 52*   < >  --  55* 53*  CREATININE 1.25*   < >  --  1.09 1.25*  CALCIUM 7.4*   < >  --  7.6* 7.2*  MG 2.3   < > 2.2 2.2  --   PHOS 2.5  --  1.7*  --   --    < > = values in this interval not displayed.   Lipid Panel:     Component Value Date/Time   CHOL 134 09/21/2019 0354   TRIG 113 09/21/2019 0354   HDL 21 (L) 09/21/2019 0354   CHOLHDL 6.4 09/21/2019 0354   VLDL 23 09/21/2019 0354   LDLCALC 90 09/21/2019 0354   HgbA1c:  Lab Results  Component Value Date   HGBA1C 6.8 (H) 09/20/2019   Urine Drug Screen:     Component Value Date/Time   LABOPIA NONE DETECTED 10/27/2015 2210   COCAINSCRNUR NONE DETECTED 10/27/2015 2210   LABBENZ NONE DETECTED 10/27/2015 2210   AMPHETMU NONE DETECTED 10/27/2015 2210   THCU NONE DETECTED 10/27/2015 2210   LABBARB NONE DETECTED 10/27/2015 2210    Alcohol Level     Component Value Date/Time   ETH <5 10/27/2015 1855    IMAGING past 48 hours  DG Chest Port 1 View  Result Date: 09/26/2019 CLINICAL DATA:  Respiratory failure EXAM:  PORTABLE CHEST 1 VIEW COMPARISON:  09/24/2019 FINDINGS: Film is rotated to the right. Endotracheal tube in similar position accounting for degree of rotation. Gastric tube courses through in off the field of the radiograph. Cardiomediastinal contours are stable accounting for degree of rotation as well. Basilar airspace disease persists, superimposed on background pulmonary emphysema. No signs of pleural effusion. Visualized skeletal structures are unremarkable. IMPRESSION: Persistent bibasilar airspace disease superimposed on background pulmonary emphysema. Electronically Signed   By: Zetta Bills M.D.   On: 09/26/2019 08:43   DG Chest Port 1 View  Result Date: 09/24/2019 CLINICAL DATA:  F/u Covid +, they are aware the central line is not in correct position, they have pulled back, and will re do another time, per rn EXAM: PORTABLE CHEST 1 VIEW COMPARISON:  Radiograph 09/22/2019  FINDINGS: Endotracheal tube, NG tube and central venous line unchanged. Central venous line remains in the brachycephalic vein. Normal cardiac silhouette. There is bibasilar patchy airspace disease not changed from prior. No pneumothorax. IMPRESSION: 1. Stable support apparatus. 2. Patchy bibasilar airspace disease again noted. Electronically Signed   By: Suzy Bouchard M.D.   On: 09/24/2019 10:37   VAS [place] LOWER EXTREMITY ARTERIAL DUPLEX  Result Date: 09/24/2019 LOWER EXTREMITY ARTERIAL DUPLEX STUDY Indications: Pallor. Cool extremities. COVID-19 positive.  Current ABI: Not obtained Limitations: Patient position Comparison Study: No prior study Performing Technologist: Maudry Mayhew MHA, RDMS, RVT, RDCS  Examination Guidelines: A complete evaluation includes B-mode imaging, spectral Doppler, color Doppler, and power Doppler as needed of all accessible portions of each vessel. Bilateral testing is considered an integral part of a complete examination. Limited examinations for reoccurring indications may be performed as noted.   +----------+--------+-----+--------+---------+--------+ RIGHT     PSV cm/sRatioStenosisWaveform Comments +----------+--------+-----+--------+---------+--------+ CFA Distal52                   triphasic         +----------+--------+-----+--------+---------+--------+ DFA       33                   triphasic         +----------+--------+-----+--------+---------+--------+ SFA Prox  75                   triphasic         +----------+--------+-----+--------+---------+--------+ SFA Mid   53                   triphasic         +----------+--------+-----+--------+---------+--------+ SFA Distal59                   triphasic         +----------+--------+-----+--------+---------+--------+ POP Prox  58                   triphasic         +----------+--------+-----+--------+---------+--------+ POP Distal78                   triphasic         +----------+--------+-----+--------+---------+--------+ ATA Distal67                   triphasic         +----------+--------+-----+--------+---------+--------+ PTA Prox  87                   triphasic         +----------+--------+-----+--------+---------+--------+ PTA Distal83                   triphasic         +----------+--------+-----+--------+---------+--------+ PERO Mid  90                   triphasic         +----------+--------+-----+--------+---------+--------+ DP        43                   triphasic         +----------+--------+-----+--------+---------+--------+  +-----------+--------+-----+--------+---------+--------+ LEFT       PSV cm/sRatioStenosisWaveform Comments +-----------+--------+-----+--------+---------+--------+ CFA Distal 48                   triphasic         +-----------+--------+-----+--------+---------+--------+ DFA        45  triphasic         +-----------+--------+-----+--------+---------+--------+ SFA Prox   60                    triphasic         +-----------+--------+-----+--------+---------+--------+ SFA Mid    139                  triphasic         +-----------+--------+-----+--------+---------+--------+ SFA Distal 55                   triphasic         +-----------+--------+-----+--------+---------+--------+ POP Prox   39                   triphasic         +-----------+--------+-----+--------+---------+--------+ POP Distal 46                   triphasic         +-----------+--------+-----+--------+---------+--------+ ATA Distal 20                   triphasic         +-----------+--------+-----+--------+---------+--------+ PTA Distal 94                   triphasic         +-----------+--------+-----+--------+---------+--------+ PERO Distal38                   triphasic         +-----------+--------+-----+--------+---------+--------+ DP         48                   triphasic         +-----------+--------+-----+--------+---------+--------+   Summary: Bilateral: No evidence of arterial occlusive disease.  Incidental finding: sonographic evidence of Baker's cyst in the left popliteal fossa. See table(s) above for measurements and observations. Electronically signed by Monica Martinez MD on 09/24/2019 at 4:20:02 PM.    Final     PHYSICAL EXAM   Temp:  [97.7 F (36.5 C)-99.8 F (37.7 C)] 97.7 F (36.5 C) (02/10 0727) Pulse Rate:  [35-80] 61 (02/10 0848) Resp:  [10-29] 22 (02/10 0848) BP: (79-126)/(36-69) 104/51 (02/10 0848) SpO2:  [71 %-100 %] 98 % (02/10 0848) FiO2 (%):  [40 %] 40 % (02/10 0900) Weight:  [90.4 kg] 90.4 kg (02/10 0500)  General - Well nourished, well developed, intubated on sedation.  Ophthalmologic - fundi not visualized due to noncooperation.  Cardiovascular - Regular rate and rhythm with frequent PACs on tele.  Neuro - intubated on precedex and low dose fentanyl, eyes closed but able to open on repetitive stimulation, able to squeeze on both  hand and stick out tongue on commands, but only one time, did not repeat on second round of requests. With eye opening, eyes in mid position, did not gaze bilaterally as requested. Left pupil 2.52mm reactive to light. Right corneal clouding and with enlarged fixed pupil. Inconsistently blinking to visual threat on the right but blinking to the left consistently, doll's eyes sluggish, not tracking. Corneal reflex present, gag and cough present. Breathing over the vent.  Facial symmetry not able to test due to ET tube.  Tongue protrusion not cooperative. On pain stimulation, slight withdraw on BUEs but not BLEs. DTR 1+ and bilaterally babinski positive. Sensation, coordination and gait not tested.  ASSESSMENT/PLAN Mr. Chris Lewis is a 79 y.o. male with history of COPD, HLD,  MVP s/p repair, TIA in 2004, hypothyroidism, depression/anxiety who was was admitted at [facility] for [place] 08/27/2019 thru 09/10/2019, fell while trying to get OOB at home and found altered with progressive confusion, not speaking by family. Concern for aphasia and R sided weakness in the ED.   Stroke: B anterior and posterior circulation infarcts embolic secondary to unclear source - differentials include: Aflutter with RVR, cardiomyopathy with low EF, massive PE with ? PFO, hypercoagulaility from COVID infection, ? endocarditis from sepsis  CT head age indeterminate R frontal lobe infarct.   CTA head & neck no ELVO.   CT perfusion normal  MRI  Multiple small B anterior and posterior circulation infarcts. Old R frontal lobe infarct.   2D Echo RV severely reduced systolic fxn, severe increase in size. LV EF 35%. MV repair appears intact.  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 Can consider transition to [place] if no further procedure planned. However, not sure if he needs trach.   Therapy recommendations:  pending   Disposition:  pending   Code Status - limited code  Acute Hypoxemic Respiratory Failure COVID-19 Infection Massive PE  Positive test 08/27/2019   GVH for COVID 08/27/2019 thru 09/10/2019  Home O2 increased from 2L->4L  On hydrocortisone taper at home  still Intubated on vent  Persistent pneumonia, with interval improvement by chest CT 09/22/2019  Vancomycin started 09/17/2019>>  CCM on board  Shock - septic vs PE with R heart strain vs cardiogenic ? MRSA endocarditis MRSA PNA, MRSA UTI  Elevated WBC - 13.5->15.3->19.8->14.0->20.5->13.8   Afebrile now  Hypotensive - off pressor now   Thrombocytopenia, improving - 56->84->90->116->111->160->140   ID on board, now on 6-week IV vanco  CCM on board  NSTEMI Acute HF w/ EF 35% - ? COVID Myocarditis Atrial Flutter w/ RVR Bradycardia, resolved  Cardiology on board  TEE unable to perform d/t hx alchalasia  2D echo with Definity shows no LV thrombus  On heparin IV and ASA 81  Massive PE  CTA Chest -  Acute BILATERAL pulmonary emboli. Overall clot burden is moderate.   Heparin IV per pharmacy  DVT neg x 2  TCD bubble study - may consider once stable  Hyperlipidemia  Home meds:   No statin d/t intolerance  LDL 90, goal < 70  May Consider PCSK-9 as outpt   Aseptic Meningitis  CSF WBC 36, RBC 78, 97% neu, pro 48, glu 97  Likely nonbacterial   time course not in favor of Covid meningoencephalitis as he tested positive 4 weeks ago.   Supportive care  Hyperglycemia   A1C 6.8  Hyperglycemia  On lantus  SSI  CBG monitoring  Other Stroke Risk Factors  Advanced age  Former Cigarette smoker, quit 22 yrs ago  Hx stroke/TIA  10/2002 - TIA w/ transient R HH   Family hx stroke  (father)  Coronary artery disease  MV stenosis s/p repair  Other Active Problems  AKI / NGMA - creatinine - 1.28->1.21->1.25->1.08->1.09->1.25    Hypothyroidism    Anxiety/pain on lexapro  Aortic Atherosclerosis (ICD10-I70.0)   Emphysema (ICD10-J43.9).   Severe  esophageal stenosis, hx alchalasia  Hypophosphatemia, replaced  Hospital day # 8  This patient is critically ill and at significant risk of neurological worsening, death and care requires constant monitoring of vital signs, hemodynamics,respiratory and cardiac monitoring, extensive review of multiple databases, frequent neurological assessment, discussion with family, other specialists and medical decision making of high complexity. I spent 30 minutes of neurocritical care time  in the care of  this patient.    Rosalin Hawking, MD PhD Stroke Neurology 09/26/2019 9:47 AM     To contact Stroke Continuity provider, please refer to [email or web address] After hours, contact General Neurology

## 2019-09-27 DIAGNOSIS — J189 Pneumonia, unspecified organism: Secondary | ICD-10-CM

## 2019-09-27 DIAGNOSIS — E1159 Type 2 diabetes mellitus with other circulatory complications: Secondary | ICD-10-CM

## 2019-09-27 DIAGNOSIS — Z794 Long term (current) use of insulin: Secondary | ICD-10-CM

## 2019-09-27 LAB — COMPREHENSIVE METABOLIC PANEL
ALT: 27 U/L (ref 0–44)
AST: 28 U/L (ref 15–41)
Albumin: 1.7 g/dL — ABNORMAL LOW (ref 3.5–5.0)
Alkaline Phosphatase: 65 U/L (ref 38–126)
Anion gap: 13 (ref 5–15)
BUN: 52 mg/dL — ABNORMAL HIGH (ref 8–23)
CO2: 24 mmol/L (ref 22–32)
Calcium: 7.8 mg/dL — ABNORMAL LOW (ref 8.9–10.3)
Chloride: 111 mmol/L (ref 98–111)
Creatinine, Ser: 1.33 mg/dL — ABNORMAL HIGH (ref 0.61–1.24)
GFR calc Af Amer: 58 mL/min — ABNORMAL LOW (ref >60)
GFR calc non Af Amer: 50 mL/min — ABNORMAL LOW (ref >60)
Glucose, Bld: 179 mg/dL — ABNORMAL HIGH (ref 70–99)
Potassium: 4.3 mmol/L (ref 3.5–5.1)
Sodium: 148 mmol/L — ABNORMAL HIGH (ref 135–145)
Total Bilirubin: 0.5 mg/dL (ref 0.3–1.2)
Total Protein: 5.3 g/dL — ABNORMAL LOW (ref 6.5–8.1)

## 2019-09-27 LAB — CBC
HCT: 38.8 % — ABNORMAL LOW (ref 39.0–52.0)
Hemoglobin: 12.1 g/dL — ABNORMAL LOW (ref 13.0–17.0)
MCH: 30.9 pg (ref 26.0–34.0)
MCHC: 31.2 g/dL (ref 30.0–36.0)
MCV: 99.2 fL (ref 80.0–100.0)
Platelets: 172 10*3/uL (ref 150–400)
RBC: 3.91 MIL/uL — ABNORMAL LOW (ref 4.22–5.81)
RDW: 18.3 % — ABNORMAL HIGH (ref 11.5–15.5)
WBC: 15.8 10*3/uL — ABNORMAL HIGH (ref 4.0–10.5)
nRBC: 0.3 % — ABNORMAL HIGH (ref 0.0–0.2)

## 2019-09-27 LAB — HEPARIN LEVEL (UNFRACTIONATED)
Heparin Unfractionated: 1.22 IU/mL — ABNORMAL HIGH (ref 0.30–0.70)
Heparin Unfractionated: 0.51 IU/mL (ref 0.30–0.70)

## 2019-09-27 LAB — GLUCOSE, CAPILLARY
Glucose-Capillary: 112 mg/dL — ABNORMAL HIGH (ref 70–99)
Glucose-Capillary: 185 mg/dL — ABNORMAL HIGH (ref 70–99)
Glucose-Capillary: 118 mg/dL — ABNORMAL HIGH (ref 70–99)
Glucose-Capillary: 128 mg/dL — ABNORMAL HIGH (ref 70–99)
Glucose-Capillary: 143 mg/dL — ABNORMAL HIGH (ref 70–99)
Glucose-Capillary: 115 mg/dL — ABNORMAL HIGH (ref 70–99)

## 2019-09-27 LAB — VANCOMYCIN, PEAK: Vancomycin Pk: 34 ug/mL (ref 30–40)

## 2019-09-27 LAB — VANCOMYCIN, TROUGH: Vancomycin Tr: 22 ug/mL (ref 15–20)

## 2019-09-27 MED ORDER — VANCOMYCIN HCL IN DEXTROSE 1-5 GM/200ML-% IV SOLN
1000.0000 mg | INTRAVENOUS | Status: DC
  Administered 2019-09-28 – 2019-09-30 (×3): 1000 mg via INTRAVENOUS
  Filled 2019-09-27 (×3): qty 200

## 2019-09-27 MED ORDER — FREE WATER
250.0000 mL | Status: DC
  Administered 2019-09-27 – 2019-09-30 (×35): 250 mL

## 2019-09-27 NOTE — Progress Notes (Signed)
Notified pharmacist of critical vancomycin trough level: 22.

## 2019-09-27 NOTE — Progress Notes (Signed)
ANTICOAGULATION CONSULT NOTE   Pharmacy Consult for Heparin Indication: acute pulmonary embolus 09/22/19  Patient Measurements: Height: 5\' 8"  (172.7 cm)(Per family) Weight: 207 lb 14.3 oz (94.3 kg) IBW/kg (Calculated) : 68.4 Heparin Dosing Weight: 85 kg  Vital Signs: Temp: 98.3 F (36.8 C) (02/11 0759) Temp Source: Oral (02/11 0759) BP: 110/60 (02/11 0800) Pulse Rate: 71 (02/11 0800)  Labs: Recent Labs    09/24/19 1400 09/24/19 2223 09/25/19 0032 09/25/19 0129 09/25/19 1105 09/26/19 0530 09/26/19 0530 09/26/19 0726 09/27/19 0508 09/27/19 0520 09/27/19 0700  HGB   < >  --   --  12.7*  --  11.1*  --   --   --  12.1*  --   HCT  --   --   --  40.9  --  35.6*  --   --   --  38.8*  --   PLT  --   --   --  160  --  140*  --   --   --  172  --   HEPARINUNFRC   < >  --   --  0.52   < > 1.94*   < > 0.51 1.22*  --  0.51  CREATININE  --   --   --  1.09  --  1.25*  --   --   --  1.33*  --   TROPONINIHS  --  134* 162*  --   --   --   --   --   --   --   --    < > = values in this interval not displayed.    Assessment: 79 year old male with submassive acute bilateral PE 09/22/19, moderate clot burden, positive for R heart strain with RV/LV 1.3.  2/4 Lower extremity dopplers are negative for DVT, repeat 2/6 pending. TTE negative for LV thrombus. TEE on hold - needs barium study.  CT Head - no hemorrhage. MRI with multiple infarcts, chronic CVA and chronic microhemorrhage.   Patient heparin level drawn inaccurately this morning and came back 1.22. I asked the nurse to hold the heparin and flush the line before redrawing and the repeat level was 0.51  Goal of Therapy:  Heparin level 0.3-0.7 units/ml - Discussed with MD, will keep higher goal despite strokes due to clinical worsening and high risk of PE - try to keep ~0.5 Monitor platelets by anticoagulation protocol: Yes   Plan:  -Continue Heparin 1400 units/hr -Daily HL, CBC  Nicoletta Dress, PharmD PGY2 Infectious Disease  Pharmacy Resident    Please check AMION for all [place] numbers  09/27/2019 9:38 AM

## 2019-09-27 NOTE — Progress Notes (Signed)
Assisted with E-Link video conference with family.

## 2019-09-27 NOTE — Progress Notes (Signed)
STROKE TEAM PROGRESS NOTE   INTERVAL HISTORY Discussed with RN and Dr. Ruthann Cancer in the hallway. Pt still on vent and needed sedation but off pressor. He is more awake than yesterday, able to open eyes on voice and follows limited simple commands bilaterally. CCM felt that pt would like to need trach if not able to extubate over the weekend.  Vitals:   09/27/19 0800 09/27/19 0900 09/27/19 1101 09/27/19 1121  BP: 110/60 (!) 101/50    Pulse: 71 (!) 36    Resp: (!) 22 20    Temp:    98.6 F (37 C)  TempSrc:    Axillary  SpO2: 99% 99% 98%   Weight:      Height:        CBC:  Recent Labs  Lab 09/26/19 0530 09/27/19 0520  WBC 13.8* 15.8*  HGB 11.1* 12.1*  HCT 35.6* 38.8*  MCV 99.2 99.2  PLT 140* [id number]   Basic Metabolic Panel:  Recent Labs  Lab 09/23/19 0506 09/24/19 0457 09/24/19 2223 09/25/19 0129 09/25/19 0129 09/26/19 0530 09/27/19 0520  NA 146*   < >  --  150*   < > 146* 148*  K 4.1   < >  --  3.8   < > 3.9 4.3  CL 113*   < >  --  116*   < > 113* 111  CO2 26   < >  --  28   < > 24 24  GLUCOSE 218*   < >  --  142*   < > 118* 179*  BUN 52*   < >  --  55*   < > 53* 52*  CREATININE 1.25*   < >  --  1.09   < > 1.25* 1.33*  CALCIUM 7.4*   < >  --  7.6*   < > 7.2* 7.8*  MG 2.3   < > 2.2 2.2  --   --   --   PHOS 2.5  --  1.7*  --   --   --   --    < > = values in this interval not displayed.   Lipid Panel:     Component Value Date/Time   CHOL 134 09/21/2019 0354   TRIG 113 09/21/2019 0354   HDL 21 (L) 09/21/2019 0354   CHOLHDL 6.4 09/21/2019 0354   VLDL 23 09/21/2019 0354   LDLCALC 90 09/21/2019 0354   HgbA1c:  Lab Results  Component Value Date   HGBA1C 6.8 (H) 09/20/2019   Urine Drug Screen:     Component Value Date/Time   LABOPIA NONE DETECTED 10/27/2015 2210   COCAINSCRNUR NONE DETECTED 10/27/2015 2210   LABBENZ NONE DETECTED 10/27/2015 2210   AMPHETMU NONE DETECTED 10/27/2015 2210   THCU NONE DETECTED 10/27/2015 2210   LABBARB NONE DETECTED 10/27/2015  2210    Alcohol Level     Component Value Date/Time   ETH <5 10/27/2015 1855    IMAGING past 48 hours  DG Chest Port 1 View  Result Date: 09/26/2019 CLINICAL DATA:  Respiratory failure EXAM: PORTABLE CHEST 1 VIEW COMPARISON:  09/24/2019 FINDINGS: Film is rotated to the right. Endotracheal tube in similar position accounting for degree of rotation. Gastric tube courses through in off the field of the radiograph. Cardiomediastinal contours are stable accounting for degree of rotation as well. Basilar airspace disease persists, superimposed on background pulmonary emphysema. No signs of pleural effusion. Visualized skeletal structures are unremarkable. IMPRESSION: Persistent bibasilar airspace disease superimposed  on background pulmonary emphysema. Electronically Signed   By: Zetta Bills M.D.   On: 09/26/2019 08:43    PHYSICAL EXAM   Temp:  [97.5 F (36.4 C)-98.7 F (37.1 C)] 98.6 F (37 C) (02/11 1121) Pulse Rate:  [33-126] 36 (02/11 0900) Resp:  [16-31] 20 (02/11 0900) BP: (94-164)/(46-83) 101/50 (02/11 0900) SpO2:  [88 %-99 %] 98 % (02/11 1101) FiO2 (%):  [30 %-40 %] 30 % (02/11 1101) Weight:  [94.3 kg] 94.3 kg (02/11 0500)  General - Well nourished, well developed, intubated on sedation.  Ophthalmologic - fundi not visualized due to noncooperation.  Cardiovascular - Regular rate and rhythm with frequent PACs on tele.  Neuro - intubated on precedex and low dose fentanyl, eyes closed but able to open on voice, able to squeeze on both hand and stick out tongue as well as wiggle toes bilaterally. With eye opening, eyes in mid position, able to gaze bilaterally on request but incomplete. Left pupil 2.19mm reactive to light. Right corneal clouding and with underlying enlarged fixed pupil. Inconsistently blinking to visual threat on the right but blinking to the left consistently, doll's eyes sluggish, not tracking. Corneal reflex present, gag and cough present. Breathing over the vent.   Facial symmetry not able to test due to ET tube.  Tongue protrusion not cooperative. LUE proximal 0/5 but bicep 3/5, finger grip 3/5, RUE 0/5 proximal and bicep but finger grip 2/5. BLEs 0/5 proximally even with pain stimulation, but able to wiggle toes bilaterally L>R. DTR 1+ and bilaterally babinski positive. Sensation, coordination and gait not tested.  ASSESSMENT/PLAN Chris Lewis is a 78 y.o. male with history of COPD, HLD, MVP s/p repair, TIA in 2004, hypothyroidism, depression/anxiety who was was admitted at [facility] for [place] 08/27/2019 thru 09/10/2019, fell while trying to get OOB at home and found altered with progressive confusion, not speaking by family. Concern for aphasia and R sided weakness in the ED.   Stroke: B anterior and posterior circulation infarcts embolic secondary to unclear source - differentials include: Aflutter with RVR, cardiomyopathy with low EF, massive PE with ? PFO, hypercoagulaility from COVID infection, ? endocarditis from sepsis  CT head age indeterminate R frontal lobe infarct.   CTA head & neck no ELVO.   CT perfusion normal  MRI  Multiple small B anterior and posterior circulation infarcts. Old R frontal lobe infarct.   2D Echo RV severely reduced systolic fxn, severe increase in size. LV EF 35%. MV repair appears intact.  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 May consider transition to [place] after trach.   Therapy recommendations:  pending   Disposition:  pending   Code Status - limited code  Acute Hypoxemic Respiratory Failure COVID-19 Infection Massive PE  Positive test 08/27/2019   [facility] for COVID 08/27/2019  thru 09/10/2019  Home O2 increased from 2L->4L  On hydrocortisone taper at home  still Intubated on vent  Persistent pneumonia, with interval improvement by chest CT 09/22/2019  Vancomycin started 09/19/2019>>  Plan for trach next week as per CCM  CCM on board  Shock - septic vs  PE with R heart strain vs cardiogenic ? MRSA endocarditis MRSA PNA, MRSA UTI  Elevated WBC - 13.5->15.3->19.8->14.0->20.5->13.8->15.8   Afebrile now  Hypotensive - off pressor now   Thrombocytopenia, improving - 56->84->90->116->111->160->140->172  ID on board, now on 6-week IV vanco  CCM on board  NSTEMI Acute HF w/ EF 35% - ? COVID Myocarditis Atrial Flutter w/ RVR Bradycardia, resolved  Cardiology on board  TEE unable to perform d/t hx alchalasia  2D echo with Definity shows no LV thrombus  On heparin IV and ASA 81  Massive PE  CTA Chest -  Acute BILATERAL pulmonary emboli. Overall clot burden is moderate.   Heparin IV per pharmacy  DVT neg x 2  TCD bubble study - may consider once stable  Consider transition to [place] once after trach  Hyperlipidemia  Home meds:   No statin d/t intolerance  LDL 90, goal < 70  May Consider PCSK-9 as outpt   Aseptic Meningitis  CSF WBC 36, RBC 78, 97% neu, pro 48, glu 97  Likely nonbacterial   time course not in favor of Covid meningoencephalitis as he tested positive 4 weeks ago.   Supportive care  Hyperglycemia   A1C 6.8  Hyperglycemia  On lantus  SSI  CBG monitoring  Other Stroke Risk Factors  Advanced age  Former Cigarette smoker, quit 22 yrs ago  Hx stroke/TIA  10/2002 - TIA w/ transient R HH   Family hx stroke (father)  Coronary artery disease  MV stenosis s/p repair  Other Active Problems  AKI / NGMA - creatinine - 1.28->1.21->1.25->1.08->1.09->1.25->1.33   Hypothyroidism    Anxiety/pain on lexapro  Aortic Atherosclerosis (ICD10-I70.0)   Emphysema (ICD10-J43.9).   Severe esophageal stenosis, hx  alchalasia  Hypophosphatemia, replaced  Hospital day # 9  This patient is critically ill and at significant risk of neurological worsening, death and care requires constant monitoring of vital signs, hemodynamics,respiratory and cardiac monitoring, extensive review of multiple databases, frequent neurological assessment, discussion with family, other specialists and medical decision making of high complexity. I spent 35 minutes of neurocritical care time  in the care of  this patient. I also discussed with Dr. Ruthann Cancer.   Chris Hawking, MD PhD Stroke Neurology 09/27/2019 12:34 PM     To contact Stroke Continuity provider, please refer to [email or web address] After hours, contact General Neurology

## 2019-09-27 NOTE — Progress Notes (Signed)
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 Per wife he fell overnight while trying to get out of bed on his own.  AM of admit was found altered and not speaking by his family.    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  Bradycardic on Precedex.  Atrial flutter on telemetry.  Heart rate as high as 160s on norepinephrine, changed to phenylephrine  2/07 requiring metoprolol for rapid heart rhythm.  Digoxin loaded.  2/7 improved neurologically. 2/08 Infectious disease consulted, Lantus increased, renal function normalized, white blood cell count trending down, hemoglobin stable.  Off pressors still tachycardic, tachypneic.  Added Lopressor scheduled via tube, resuming Precedex, discontinuing stress dose steroids 2/09 change to pressure control  Consults:  Neurology  Infectious disease consulted 2/8   Procedures:  ETT 2/2 >>  CVC (backed out but still in innominate vein on CXR)   Significant Diagnostic Tests:  CT Head 2/2 >> motion degraded study, age-indeterminate infarct involving the R frontal lobe CTA Head / Neck / Perfusion 2/2 >> no emergent large vessel occlusion, normal cerebral CT perfusion EEG 2/03 >> continuous slowing MRI brain 2/03 >> multiple small acute infarcts b/l in anterior and posterior circulations Echo 2/05 >> dilated RV with reduced systolic function and evidence of overload, LVEF 35 to 40%  with global hypokinesis CT angio chest 2/06 >> b/l PE, RV:LV 1.3, atherosclerosis, centrilobular emphysema, basilar ASD  Micro Data:  MRSA PCR 2/3 >> positive  UC 2/2 >> MRSA 100k CSF 2/4 >> NG final BCx2 2/2 >> NG final  resp cx 2/5>> MRSA (R erythromycin), few candida albicans  Antimicrobials:  Ampicillin 2/2 >> 2/5 Cefepime 2/2 >> 2/5 Ceftriaxone 2/2 >> 2/4 Flagyl 2/2 >> 2/5 Vancomycin 2/2>>>  Interim history/subjective:  2/11: can arouse but intermittently following commands. Tolerated cpap for very very brief time yesterday 5-10 mins. Then decompensated with desat and tachycardia. This am on cpap with some mild tachypnea.  2/10:  No acute events overnight. Remains vented and sedated.  2/9:Uncomfortable on vent.  Bradycardia with precedex overnight.  Objective   Blood pressure (!) 101/51, pulse 68, temperature 98.3 F (36.8 C), temperature source Oral, resp. rate (!) 22, height 5\' 8"  (1.727 m), weight 94.3 kg, SpO2 94 %.    Vent Mode: PCV FiO2 (%):  [30 %-40 %] 30 % Set Rate:  [18 bmp] 18 bmp PEEP:  [5 cmH20] 5 cmH20 Pressure Support:  [5 cmH20] 5 cmH20 Plateau Pressure:  [19 cmH20-24 cmH20] 19 cmH20   Intake/Output Summary (Last 24 hours) at 09/27/2019 0824 Last data filed at 09/27/2019 0600 Gross per 24 hour  Intake 3283.78 ml  Output 1850 ml  Net 1433.78 ml   Filed Weights   09/25/19 0530 09/26/19 0500 09/27/19 0500  Weight: 85 kg 90.4 kg 94.3 kg    Examination:  General - lightly sedated, awakens and follows on L Eyes - pupils reactive, eomi, scleral edema on R ENT - ETT in  place Cardiac - regular rate/rhythm, no murmur Chest - prolonged exhalation, some mild tachypnea Abdomen - soft, non tender, + bowel sounds Extremities - no cyanosis, clubbing, or edema Skin - no rashes Neuro - RASS -1 to -2   Resolved Hospital Problem list   None anion gap metabolic acidosis, Acute kidney failure, creatinine normalized on 2/8, Relative adrenal insufficiency,  thrombocytopenia from sepsis  Assessment & Plan:   Acute hypoxic respiratory failure from MRSA HCAP with hx of severe COPD/emphysema and COVID 19 pneumonia. - change to pressure control 2/09 - f/u CXR - scheduled BDs -pt will likely need trach. Have discussed with wife. At this time no one on service able to do so and no rush with only day 10 of vent, it appears Monday there will be staff able to do perc trach so if unable to make progress over weekend would recommend trach early next week.   Acute pulmonary embolism with cor pulmonale in setting of recent COVID 19 infection. - pressors to keep MAP > 65 - continue heparin gtt for now until after anticipated trach early next week.   Bilateral anterior and posterior circulation infarcts likely from septic emboli. - continue ASA - per neuro can transition to oral a/c pending procedures.   Thrombocytopenia -Resolved Normocytic anemia:  -relatively stable -follow trend  Acute metabolic encephalopathy 2nd to sepsis and recent cva Hx of anxiety. - RASS goal 0 to -1  - restart lexapro -wean sedation to assess mental status to understand better of needs trach.   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 A flutter with RVR new onset >> back in sinus rhythm. Bradycardia from precedex. S/p MVR. - monitor heart rhythm - hold lopressor for now  Hypernatremia. - free water -relatively stable ARF:  -indices rising 1.33  -follow and uop as well.   Hx of hypothyroidism. - continue synthroid  Hyperglycemia. - SSI with lantus   Best practice:  Diet: tube feeds DVT prophylaxis: heparin GI prophylaxis: protonix  Glucose control: SSI Mobility: BR Disposition: ICU  Labs:   CMP Latest Ref Rng & Units 09/27/2019 09/26/2019 09/25/2019  Glucose 70 - 99 mg/dL 179(H) 118(H) 142(H)  BUN 8 -  23 mg/dL 52(H) 53(H) 55(H)  Creatinine 0.61 - 1.24 mg/dL 1.33(H) 1.25(H) 1.09  Sodium 135 - 145 mmol/L 148(H) 146(H) 150(H)  Potassium 3.5 - 5.1 mmol/L 4.3 3.9 3.8  Chloride 98 - 111 mmol/L 111 113(H) 116(H)  CO2 22 - 32 mmol/L 24 24 28   Calcium 8.9 - 10.3 mg/dL 7.8(L) 7.2(L) 7.6(L)  Total Protein 6.5 - 8.1 g/dL 5.3(L) - -  Total Bilirubin 0.3 - 1.2 mg/dL 0.5 - -  Alkaline Phos 38 - 126 U/L 65 - -  AST 15 - 41 U/L 28 - -  ALT 0 - 44 U/L 27 - -    CBC Latest Ref Rng & Units 09/27/2019 09/26/2019 09/25/2019  WBC 4.0 - 10.5 K/uL 15.8(H) 13.8(H) 20.5(H)  Hemoglobin 13.0 - 17.0 g/dL 12.1(L) 11.1(L) 12.7(L)  Hematocrit 39.0 - 52.0 % 38.8(L) 35.6(L) 40.9  Platelets 150 - 400 K/uL 172 140(L) 160    ABG    Component Value Date/Time   PHART 7.363 09/21/2019 1128   PCO2ART 36.6 09/21/2019 1128   PO2ART 75.0 (L) 09/21/2019 1128   HCO3 20.7 09/21/2019 1128   TCO2 22 09/21/2019 1128  ACIDBASEDEF 4.0 (H) 09/21/2019 1128   O2SAT 94.0 09/21/2019 1128    CBG (last 3)  Recent Labs    09/26/19 2313 09/27/19 0308 09/27/19 0743  GLUCAP 107* 112* 185*    Critical care time: The patient is critically ill with multiple organ systems failure and requires high complexity decision making for assessment and support, frequent evaluation and titration of therapies, application of advanced monitoring technologies and extensive interpretation of multiple databases.  Critical care time 39 mins. This represents my time independent of the NPs time taking care of the pt. This is excluding procedures.    [facility] 09/27/2019, 8:24 AM

## 2019-09-27 NOTE — Progress Notes (Signed)
Pharmacy Antibiotic Note  Chris Lewis is a 79 y.o. male admitted on 10/09/2019 with altered mental status, slight interval worsening of CXR, recent COVID-19 admission. Pt was worked up for meningitis with LP, bacterial meningitis was ruled out. Also concern for endocarditis, unable to do TEE at this time so will treat for 6 weeks. Pt does have evidence of septic emboli.   SCr is trending up slowly and vancomycin AUC is elevated at 667 (VP 34, VT 22, goal AUC 400-550).  Afebrile, WBC back up to 15.8.  Plan: Change vanc to 1gm IV Q24H for AUC 533 Monitor renal fxn, clinical progress, weekly vanc AUC    Height: 5\' 8"  (172.7 cm)(Per family) Weight: 207 lb 14.3 oz (94.3 kg) IBW/kg (Calculated) : 68.4  Temp (24hrs), Avg:98.7 F (37.1 C), Min:98.2 F (36.8 C), Max:99.5 F (37.5 C)  Recent Labs  Lab 09/21/19 2022 09/22/19 0000 09/22/19 0219 09/23/19 0506 09/24/19 0457 09/25/19 0129 09/26/19 0530 09/27/19 0216 09/27/19 0520  WBC  --   --    < > 19.8* 14.0* 20.5* 13.8*  --  15.8*  CREATININE  --   --    < > 1.25* 1.08 1.09 1.25*  --  1.33*  VANCOTROUGH 10*  --   --   --   --   --   --   --   --   VANCOPEAK  --  34  --   --   --   --   --  34  --    < > = values in this interval not displayed.    Estimated Creatinine Clearance: 49.4 mL/min (A) (by C-G formula based on SCr of 1.33 mg/dL (H)).    Allergies  Allergen Reactions  . Duac [Clindamycin Phos-Benzoyl Perox] Rash  . Contrast Media [Iodinated Diagnostic Agents] Rash    Heavy rash and itching 20 yrs ago. Needs full premeds and does well with this.  . Atorvastatin Other (See Comments)    myalgias  . Propoxyphene N-Acetaminophen Itching    rash  . Rosuvastatin Other (See Comments)    myalgias  . Metrizamide Rash    radiopaque contrast agent. Heavy rash and itching 20 yrs ago. Needs full premeds and does well with this.  Orie Fisherman Vegetable Orange [Psyllium] Other (See Comments)    Heartburn   . Other Rash    High acid  food--tomatoes, orange juice  . Tomato Other (See Comments)    Heartburn     2/8 blood cx: 2/5 RCx: MRSA 2/3 CSF cx: ngtd 2/3 MRSA pcr: pos 2/2 fungal cx: 2/2 blood cx: negative 2/2 urine cx: MRSA  2/2 cefepime > 2/5 2/2 ceftriaxone x1, resumed 2/5 >> 2/7 2/3 ampicillin > 2/5 2/3 flagyl > 2/5 2/2 vancomycin >3/17  2/5 VT 10 at 2022 2/6 VP 34 at 107, dose given 2204 >> AUC 502, no change 2/7 MD wants to push VT and AUC to upper end of goal >> increased to 1250 q24h. 2/11 VP 34, VT 22, AUC 667 >> change to 1g q24   Milledge Gerding D. Mina Marble, PharmD, BCPS, [place] 09/27/2019, 10:41 PM

## 2019-09-28 ENCOUNTER — Inpatient Hospital Stay (HOSPITAL_COMMUNITY): Payer: BC Managed Care – PPO

## 2019-09-28 DIAGNOSIS — A4102 Sepsis due to Methicillin resistant Staphylococcus aureus: Secondary | ICD-10-CM | POA: Diagnosis not present

## 2019-09-28 DIAGNOSIS — I330 Acute and subacute infective endocarditis: Secondary | ICD-10-CM | POA: Diagnosis not present

## 2019-09-28 DIAGNOSIS — I214 Non-ST elevation (NSTEMI) myocardial infarction: Secondary | ICD-10-CM | POA: Diagnosis not present

## 2019-09-28 DIAGNOSIS — G9341 Metabolic encephalopathy: Secondary | ICD-10-CM | POA: Diagnosis not present

## 2019-09-28 DIAGNOSIS — R140 Abdominal distension (gaseous): Secondary | ICD-10-CM | POA: Diagnosis not present

## 2019-09-28 DIAGNOSIS — I2699 Other pulmonary embolism without acute cor pulmonale: Secondary | ICD-10-CM | POA: Diagnosis not present

## 2019-09-28 DIAGNOSIS — J9601 Acute respiratory failure with hypoxia: Secondary | ICD-10-CM | POA: Diagnosis not present

## 2019-09-28 DIAGNOSIS — U071 COVID-19: Secondary | ICD-10-CM | POA: Diagnosis not present

## 2019-09-28 DIAGNOSIS — J189 Pneumonia, unspecified organism: Secondary | ICD-10-CM | POA: Diagnosis not present

## 2019-09-28 DIAGNOSIS — J439 Emphysema, unspecified: Secondary | ICD-10-CM | POA: Diagnosis not present

## 2019-09-28 DIAGNOSIS — I6340 Cerebral infarction due to embolism of unspecified cerebral artery: Secondary | ICD-10-CM | POA: Diagnosis not present

## 2019-09-28 DIAGNOSIS — R509 Fever, unspecified: Secondary | ICD-10-CM | POA: Diagnosis not present

## 2019-09-28 LAB — CSF CULTURE W GRAM STAIN: Culture: NO GROWTH

## 2019-09-28 LAB — COMPREHENSIVE METABOLIC PANEL
ALT: 23 U/L (ref 0–44)
AST: 28 U/L (ref 15–41)
Albumin: 1.6 g/dL — ABNORMAL LOW (ref 3.5–5.0)
Alkaline Phosphatase: 76 U/L (ref 38–126)
Anion gap: 8 (ref 5–15)
BUN: 54 mg/dL — ABNORMAL HIGH (ref 8–23)
CO2: 23 mmol/L (ref 22–32)
Calcium: 7.8 mg/dL — ABNORMAL LOW (ref 8.9–10.3)
Chloride: 114 mmol/L — ABNORMAL HIGH (ref 98–111)
Creatinine, Ser: 1.36 mg/dL — ABNORMAL HIGH (ref 0.61–1.24)
GFR calc Af Amer: 57 mL/min — ABNORMAL LOW (ref >60)
GFR calc non Af Amer: 49 mL/min — ABNORMAL LOW (ref >60)
Glucose, Bld: 126 mg/dL — ABNORMAL HIGH (ref 70–99)
Potassium: 4.6 mmol/L (ref 3.5–5.1)
Sodium: 145 mmol/L (ref 135–145)
Total Bilirubin: 0.5 mg/dL (ref 0.3–1.2)
Total Protein: 5.5 g/dL — ABNORMAL LOW (ref 6.5–8.1)

## 2019-09-28 LAB — TRIGLYCERIDES: Triglycerides: 27 mg/dL (ref <150)

## 2019-09-28 LAB — HEPARIN LEVEL (UNFRACTIONATED)
Heparin Unfractionated: 0.16 IU/mL — ABNORMAL LOW (ref 0.30–0.70)
Heparin Unfractionated: 0.21 IU/mL — ABNORMAL LOW (ref 0.30–0.70)
Heparin Unfractionated: <0.1 IU/mL — ABNORMAL LOW (ref 0.30–0.70)

## 2019-09-28 LAB — CBC
HCT: 38.5 % — ABNORMAL LOW (ref 39.0–52.0)
Hemoglobin: 11.7 g/dL — ABNORMAL LOW (ref 13.0–17.0)
MCH: 30.3 pg (ref 26.0–34.0)
MCHC: 30.4 g/dL (ref 30.0–36.0)
MCV: 99.7 fL (ref 80.0–100.0)
Platelets: 187 10*3/uL (ref 150–400)
RBC: 3.86 MIL/uL — ABNORMAL LOW (ref 4.22–5.81)
RDW: 17.9 % — ABNORMAL HIGH (ref 11.5–15.5)
WBC: 13.9 10*3/uL — ABNORMAL HIGH (ref 4.0–10.5)
nRBC: 0.2 % (ref 0.0–0.2)

## 2019-09-28 LAB — GLUCOSE, CAPILLARY
Glucose-Capillary: 104 mg/dL — ABNORMAL HIGH (ref 70–99)
Glucose-Capillary: 113 mg/dL — ABNORMAL HIGH (ref 70–99)
Glucose-Capillary: 114 mg/dL — ABNORMAL HIGH (ref 70–99)
Glucose-Capillary: 127 mg/dL — ABNORMAL HIGH (ref 70–99)
Glucose-Capillary: 142 mg/dL — ABNORMAL HIGH (ref 70–99)
Glucose-Capillary: 84 mg/dL (ref 70–99)

## 2019-09-28 LAB — PHOSPHORUS: Phosphorus: 5 mg/dL — ABNORMAL HIGH (ref 2.5–4.6)

## 2019-09-28 LAB — MAGNESIUM: Magnesium: 2.1 mg/dL (ref 1.7–2.4)

## 2019-09-28 MED ORDER — HEPARIN BOLUS VIA INFUSION
1000.0000 [IU] | Freq: Once | INTRAVENOUS | Status: AC
  Administered 2019-09-28: 1000 [IU] via INTRAVENOUS
  Filled 2019-09-28: qty 1000

## 2019-09-28 MED ORDER — PROPOFOL 1000 MG/100ML IV EMUL
5.0000 ug/kg/min | INTRAVENOUS | Status: DC
  Administered 2019-09-28: 5 ug/kg/min via INTRAVENOUS
  Administered 2019-09-29: 10 ug/kg/min via INTRAVENOUS
  Administered 2019-09-29: 20:00:00 15 ug/kg/min via INTRAVENOUS
  Administered 2019-09-30: 15.009 ug/kg/min via INTRAVENOUS
  Administered 2019-10-01: 04:00:00 15 ug/kg/min via INTRAVENOUS
  Filled 2019-09-28 (×8): qty 100

## 2019-09-28 MED ORDER — MIDAZOLAM HCL 2 MG/2ML IJ SOLN
1.0000 mg | INTRAMUSCULAR | Status: DC | PRN
  Administered 2019-09-30: 06:00:00 2 mg via INTRAVENOUS
  Filled 2019-09-28: qty 2

## 2019-09-28 MED ORDER — MIDAZOLAM HCL 2 MG/2ML IJ SOLN
INTRAMUSCULAR | Status: AC
  Administered 2019-09-28: 2 mg via INTRAVENOUS
  Filled 2019-09-28: qty 2

## 2019-09-28 MED ORDER — MIDAZOLAM HCL 2 MG/2ML IJ SOLN
INTRAMUSCULAR | Status: AC
  Administered 2019-09-28: 2 mg
  Filled 2019-09-28: qty 2

## 2019-09-28 NOTE — Progress Notes (Signed)
While pt weaning pt became diaphoretic, tachypnic, with HR 120s. Bolus of Fentanyl given, sedation increased back to prior settings before weaning. Versed was also administered. Will continue to monitor pt. See New orders.

## 2019-09-28 NOTE — Progress Notes (Signed)
ANTICOAGULATION CONSULT NOTE   Pharmacy Consult for Heparin Indication: acute pulmonary embolus 09/22/19  Patient Measurements: Height: 5\' 8"  (172.7 cm)(Per family) Weight: 206 lb 2.1 oz (93.5 kg) IBW/kg (Calculated) : 68.4 Heparin Dosing Weight: 85 kg  Vital Signs: Temp: 98.8 F (37.1 C) (02/12 1100) Temp Source: Axillary (02/12 1100) BP: 120/57 (02/12 1045) Pulse Rate: 78 (02/12 1045)  Labs: Recent Labs    09/26/19 0530 09/26/19 0726 09/27/19 0508 09/27/19 0520 09/27/19 0700 09/28/19 0246 09/28/19 1112  HGB 11.1*   < >  --  12.1*  --  11.7*  --   HCT 35.6*  --   --  38.8*  --  38.5*  --   PLT 140*  --   --  172  --  187  --   HEPARINUNFRC 1.94*   < >   < >  --  0.51 0.16* 0.21*  CREATININE 1.25*  --   --  1.33*  --  1.36*  --    < > = values in this interval not displayed.    Assessment: 79 year old male with submassive acute bilateral PE 09/22/19, moderate clot burden, positive for R heart strain with RV/LV 1.3.  2/4 Lower extremity dopplers are negative for DVT, repeat 2/6 pending. TTE negative for LV thrombus. TEE on hold - needs barium study.  CT Head - no hemorrhage. MRI with multiple infarcts, chronic CVA and chronic microhemorrhage.   Patient heparin level 0.16 this AM, did not seem accurate since patient has been therapeutic on 1400 units and there have been troubles with drawing his levels recently. The nurse could not get an adequate sample out of his midline and the phlebotomist could not get an adequate sample peripherally since he is so edematous in his arms. Phlebotomy ended up drawing a level from his foot, and it was low at 0.21 will increase his rate and bolus him.  Goal of Therapy:  Heparin level 0.3-0.7 units/ml - Discussed with MD, will keep higher goal despite strokes due to clinical worsening and high risk of PE - try to keep ~0.5 Monitor platelets by anticoagulation protocol: Yes   Plan:  -Bolus 1000 units x1 -Increase Heparin 1550 units/hr -Check  heparin level at 1900 -Daily HL, CBC  Nicoletta Dress, PharmD PGY2 Infectious Disease Pharmacy Resident    Please check AMION for all [facility] numbers  09/28/2019 12:04 PM

## 2019-09-28 NOTE — Progress Notes (Signed)
ANTICOAGULATION CONSULT NOTE   Pharmacy Consult for Heparin Indication: acute pulmonary embolus 09/22/19  Patient Measurements: Height: 5\' 8"  (172.7 cm)(Per family) Weight: 206 lb 2.1 oz (93.5 kg) IBW/kg (Calculated) : 68.4 Heparin Dosing Weight: 85 kg  Vital Signs: Temp: 98.9 F (37.2 C) (02/12 1955) Temp Source: Axillary (02/12 1955) BP: 120/55 (02/12 1943) Pulse Rate: 108 (02/12 1943)  Labs: Recent Labs    09/26/19 0530 09/26/19 0726 09/27/19 0520 09/27/19 0700 09/28/19 0246 09/28/19 1112 09/28/19 1844  HGB 11.1*   < > 12.1*  --  11.7*  --   --   HCT 35.6*  --  38.8*  --  38.5*  --   --   PLT 140*  --  172  --  187  --   --   HEPARINUNFRC 1.94*   < >  --    < > 0.16* 0.21* <0.10*  CREATININE 1.25*  --  1.33*  --  1.36*  --   --    < > = values in this interval not displayed.    Assessment: 79 year old male with submassive acute bilateral PE 09/22/19, moderate clot burden, positive for R heart strain with RV/LV 1.3.  2/4 Lower extremity dopplers are negative for DVT, repeat 2/6 pending. TTE negative for LV thrombus. TEE on hold - needs barium study.  CT Head - no hemorrhage. MRI with multiple infarcts, chronic CVA and chronic microhemorrhage.   Patient heparin level 0.16 this AM, did not seem accurate since patient has been therapeutic on 1400 units and there have been troubles with drawing his levels recently. The nurse could not get an adequate sample out of his midline and the phlebotomist could not get an adequate sample peripherally since he is so edematous in his arms. Phlebotomy ended up drawing a level from his foot, and it was low at 0.21 will increase his rate and bolus him.  PM: heparin level now undetectable. RN notes lots of drainage under arms and concern that heparin is not infusing properly. RN is switching the heparin drip to the midline IV site. No bleeding noted.  Goal of Therapy:  Heparin level 0.3-0.7 units/ml - Discussed with MD, will keep higher goal  despite strokes due to clinical worsening and high risk of PE - try to keep ~0.5 Monitor platelets by anticoagulation protocol: Yes   Plan:  -Bolus 1000 units x1 -Continue heparin drip at 1550 units/hr -8h HL -Daily HL, CBC -Monitor for s/sx of bleeding  Thank you for involving pharmacy in this patient's care.  Renold Genta, PharmD, BCPS Clinical Pharmacist Clinical phone for 09/28/2019 until 10p is [phone] 09/28/2019 8:23 PM  **Pharmacist phone directory can be found on [email or web address] listed under [place]**

## 2019-09-28 NOTE — Progress Notes (Signed)
PHARMACY CONSULT NOTE FOR:  OUTPATIENT  PARENTERAL ANTIBIOTIC THERAPY (OPAT)  Indication: Endocarditis  Regimen:  Vancomycin 1000mg  IV q24h End date: 10/31/2019  Dose adjusted per levels  IV antibiotic discharge orders are pended. To discharging provider:  please sign these orders via discharge navigator,  Select New Orders & click on the button choice - Manage This Unsigned Work.     Thank you for allowing pharmacy to be a part of this patient's care.  Cristela Felt, PharmD PGY1 Pharmacy Resident [employer]: [phone]  09/28/2019, 7:21 AM

## 2019-09-28 NOTE — Progress Notes (Addendum)
0218: Patient is diaphoretic and tachypneic. Rhythm changed noted on monitor, HR 100-110's, with slightly more ST depression readings on V lead. EKG obtained, no significant changes noted compared to previous EKG on chart. Temp 99.6, O2 sat stable. patient responds by nodding his head to answer questions. Denies pain. Will adjust sedation drips to make patient more comfortable.   0300: Increased precedex and patient appears more comfortable. HR decreased to 70's and no longer tanypneic. VSS.

## 2019-09-28 NOTE — Progress Notes (Signed)
STROKE TEAM PROGRESS NOTE   INTERVAL HISTORY Pt still intubated on fentanyl and propofol now due to agitation, gaging on vent. Still open eyes on voice and moving all extremities. Likely need trach next week.   Vitals:   09/28/19 1030 09/28/19 1045 09/28/19 1100 09/28/19 1123  BP: (!) 98/53 (!) 120/57    Pulse: 71 78    Resp: (!) 22 18    Temp:   98.8 F (37.1 C)   TempSrc:   Axillary   SpO2: 94% 96%  95%  Weight:      Height:        CBC:  Recent Labs  Lab 09/27/19 0520 09/28/19 0246  WBC 15.8* 13.9*  HGB 12.1* 11.7*  HCT 38.8* 38.5*  MCV 99.2 99.7  PLT 172 [id number]   Basic Metabolic Panel:  Recent Labs  Lab 09/24/19 0457 09/24/19 2223 09/25/19 0129 09/26/19 0530 09/27/19 0520 09/28/19 0246  NA   < >  --  150*   < > 148* 145  K   < >  --  3.8   < > 4.3 4.6  CL   < >  --  116*   < > 111 114*  CO2   < >  --  28   < > 24 23  GLUCOSE   < >  --  142*   < > 179* 126*  BUN   < >  --  55*   < > 52* 54*  CREATININE   < >  --  1.09   < > 1.33* 1.36*  CALCIUM   < >  --  7.6*   < > 7.8* 7.8*  MG   < > 2.2 2.2  --   --  2.1  PHOS  --  1.7*  --   --   --  5.0*   < > = values in this interval not displayed.   Lipid Panel:     Component Value Date/Time   CHOL 134 09/21/2019 0354   TRIG 113 09/21/2019 0354   HDL 21 (L) 09/21/2019 0354   CHOLHDL 6.4 09/21/2019 0354   VLDL 23 09/21/2019 0354   LDLCALC 90 09/21/2019 0354   HgbA1c:  Lab Results  Component Value Date   HGBA1C 6.8 (H) 09/20/2019   Urine Drug Screen:     Component Value Date/Time   LABOPIA NONE DETECTED 10/27/2015 2210   COCAINSCRNUR NONE DETECTED 10/27/2015 2210   LABBENZ NONE DETECTED 10/27/2015 2210   AMPHETMU NONE DETECTED 10/27/2015 2210   THCU NONE DETECTED 10/27/2015 2210   LABBARB NONE DETECTED 10/27/2015 2210    Alcohol Level     Component Value Date/Time   ETH <5 10/27/2015 1855    IMAGING past 48 hours  DG Abd 1 View  Result Date: 09/28/2019 CLINICAL DATA:  Abdominal distension EXAM:  ABDOMEN - 1 VIEW COMPARISON:  None. FINDINGS: Gastric catheter is noted extending into the second portion of the duodenum. Scattered large and small bowel gas is noted. Rectal tube is seen. No obstructive changes are noted. No bony abnormality is seen. IMPRESSION: No acute abnormality noted. Electronically Signed   By: Inez Catalina M.D.   On: 09/28/2019 11:10   DG CHEST PORT 1 VIEW  Result Date: 09/28/2019 CLINICAL DATA:  Emesis EXAM: PORTABLE CHEST 1 VIEW COMPARISON:  Earlier today FINDINGS: Endotracheal tube tip is halfway between the clavicular heads and carina. The enteric tube reaches the stomach. Bilateral infiltrate superimposed on emphysema. Normal heart size with mild  aortic tortuosity. No evidence of air leak IMPRESSION: Stable hardware positioning and bilateral infiltrates superimposed on emphysema. Electronically Signed   By: Monte Fantasia M.D.   On: 09/28/2019 11:02   DG CHEST PORT 1 VIEW  Result Date: 09/28/2019 CLINICAL DATA:  Acute hypoxemic respiratory failure EXAM: PORTABLE CHEST 1 VIEW COMPARISON:  Two days ago FINDINGS: The endotracheal tube tip is between the clavicular heads and carina. The enteric tube reaches the stomach. Bilateral interstitial and airspace opacity. There is hyperinflation. Apical emphysematous markings. There is no edema, consolidation, effusion, or pneumothorax. IMPRESSION: Stable hardware positioning and bilateral infiltrates. Electronically Signed   By: Monte Fantasia M.D.   On: 09/28/2019 07:49    PHYSICAL EXAM   Temp:  [98.1 F (36.7 C)-99.5 F (37.5 C)] 98.8 F (37.1 C) (02/12 1100) Pulse Rate:  [43-120] 78 (02/12 1045) Resp:  [18-33] 18 (02/12 1045) BP: (78-172)/(43-100) 120/57 (02/12 1045) SpO2:  [89 %-100 %] 95 % (02/12 1123) FiO2 (%):  [30 %-40 %] 40 % (02/12 1123) Weight:  [93.5 kg] 93.5 kg (02/12 0426)  General - Well nourished, well developed, intubated on sedation.  Ophthalmologic - fundi not visualized due to  noncooperation.  Cardiovascular - Regular rate and rhythm with frequent PACs on tele.  Neuro - intubated on precedex and low dose fentanyl, eyes closed but able to open on voice, able to squeeze on both hand and stick out tongue as well as wiggle toes bilaterally. With eye opening, eyes in mid position, able to gaze bilaterally on request but incomplete. Left pupil 2.10mm reactive to light. Right corneal clouding and with underlying enlarged fixed pupil. Inconsistently blinking to visual threat on the right but blinking to the left consistently, doll's eyes sluggish, not tracking. Corneal reflex present, gag and cough present. Breathing over the vent.  Facial symmetry not able to test due to ET tube.  Tongue protrusion not cooperative. LUE proximal 0/5 but bicep 3/5, finger grip 3/5, RUE 0/5 proximal and bicep but finger grip 2/5. BLEs 0/5 proximally even with pain stimulation, but able to wiggle toes bilaterally L>R. DTR 1+ and bilaterally babinski positive. Sensation, coordination and gait not tested.  ASSESSMENT/PLAN Chris Lewis is a 79 y.o. male with history of COPD, HLD, MVP s/p repair, TIA in 2004, hypothyroidism, depression/anxiety who was was admitted at [facility] for [place] 08/27/2019 thru 09/10/2019, fell while trying to get OOB at home and found altered with progressive confusion, not speaking by family. Concern for aphasia and R sided weakness in the ED.   Stroke: B anterior and posterior circulation infarcts embolic secondary to unclear source - differentials include: Aflutter with RVR, cardiomyopathy with low EF, massive PE with ? PFO, hypercoagulaility from COVID infection, ? endocarditis from sepsis  CT head age indeterminate R frontal lobe infarct.   CTA head & neck no ELVO.   CT perfusion normal  MRI  Multiple small B anterior and posterior circulation infarcts. Old R frontal lobe infarct.   2D Echo RV severely reduced systolic fxn, severe increase in size. LV EF 35%. MV repair  appears intact.  Repeat 2D echo EF 35-40%, no LV thrombus seen w/ definity  LE venous dopplers neg x 2  TCD bubble study - will consider once more stable to rule out PFO  TEE unable to perform d/t hx alchalasia  EEG continuous slowing, no seizure  LDL 90   HgbA1c 6.8  Heparin IV for VTE prophylaxis  No antithrombotic prior to admission, now on aspirin 81 mg  daily and heparin IV. May consider transition to [place] after trach.   Therapy recommendations:  pending   Disposition:  pending   Code Status - limited code  Acute Hypoxemic Respiratory Failure COVID-19 Infection Massive PE  Positive test 08/27/2019   [facility] for COVID 08/27/2019 thru 09/10/2019  Home O2 increased from 2L->4L  On hydrocortisone taper at home  still Intubated on vent  Persistent pneumonia, with interval improvement by chest CT 09/22/2019  Vancomycin started 09/23/2019>>  Attempting to wean  Plan for trach next week as per CCM  CCM on board  Shock - septic vs PE with R heart strain vs cardiogenic ? MRSA endocarditis MRSA PNA, MRSA UTI  Elevated WBC - 13.5->15.3->19.8->14.0->20.5->13.8->15.8->13.9    Afebrile now  Hypotensive - off pressor now   Thrombocytopenia, improving - 56->84->90->116->111->160->140->172->187   ID on board, now on 6-week IV vanco  CCM on board  NSTEMI Acute HF w/ EF 35% - ? COVID Myocarditis Atrial Flutter w/ RVR Bradycardia, resolved  Cardiology on board  TEE unable to perform d/t hx alchalasia  2D echo with Definity shows no LV thrombus  On heparin IV and ASA 81  Massive PE  CTA Chest -  Acute BILATERAL pulmonary emboli. Overall clot burden is moderate.   Heparin IV per pharmacy  DVT neg x 2  TCD bubble study - may consider once stable  Consider transition to [place] once after trach  Hyperlipidemia  Home meds:   No statin d/t intolerance  LDL 90, goal < 70  May Consider [facility] as outpt   Aseptic Meningitis  CSF WBC 36, RBC 78, 97% neu, pro  48, glu 97  Likely nonbacterial   time course not in favor of Covid meningoencephalitis as he tested positive 4 weeks ago.   Supportive care  Hyperglycemia   A1C 6.8  Hyperglycemia  On lantus  SSI  CBG monitoring  Other Stroke Risk Factors  Advanced age  Former Cigarette smoker, quit 22 yrs ago  Hx stroke/TIA  10/2002 - TIA w/ transient R HH   Family hx stroke (father)  Coronary artery disease  MV stenosis s/p repair  Other Active Problems  AKI / NGMA - creatinine - 1.28->1.21->1.25->1.08->1.09->1.25->1.33->1.36    Hypothyroidism    Anxiety/pain on lexapro  Aortic Atherosclerosis   Emphysema   Severe esophageal stenosis, hx alchalasia  Hypophosphatemia, replaced  Hospital day # 10  This patient is critically ill and at significant risk of neurological worsening, death and care requires constant monitoring of vital signs, hemodynamics,respiratory and cardiac monitoring, extensive review of multiple databases, frequent neurological assessment, discussion with family, other specialists and medical decision making of high complexity. I spent 30 minutes of neurocritical care time  in the care of  this patient.  Rosalin Hawking, MD PhD Stroke Neurology 09/28/2019 11:34 AM     To contact Stroke Continuity provider, please refer to [email or web address] After hours, contact General Neurology

## 2019-09-28 NOTE — Progress Notes (Signed)
NAME:  Chris Lewis, MRN:  [id number], DOB:  1941/06/08, LOS: 31 ADMISSION DATE:  09/25/2019, CONSULTATION DATE:  09/26/2019 REFERRING MD:  Alvino Chapel, CHIEF COMPLAINT:  AMS, fall  Brief History   79 year old man with a history of COPD (emphysema seen on CT scan), Achalasia, anxiety/depression, HLD, hypothyroidism, RBBB, MR s/p MV repair, Recently hospitalized at [facility] for COVID-19 PNA (1/12-1/25) now here with AMS. Per wife he fell overnight while trying to get out of bed on his own.  AM of admit was found altered and not speaking by his family.    Past Medical History  Achalasia, anxiety/depression, HLD, hypothyroidism, RBBB, MR s/p MV repair, Recently hospitalized at [facility] for covid PNA (1/12-1/25)   Significant Hospital Events   2/02 Admit  2/05 through 2/8: 2/5: Weaning sedation, weaning epinephrine.  Bradycardic on Precedex.  Atrial flutter on telemetry.  Heart rate as high as 160s on norepinephrine, changed to phenylephrine  2/07 requiring metoprolol for rapid heart rhythm.  Digoxin loaded.  2/7 improved neurologically. 2/08 Infectious disease consulted, Lantus increased, renal function normalized, white blood cell count trending down, hemoglobin stable.  Off pressors still tachycardic, tachypneic.  Added Lopressor scheduled via tube, resuming Precedex, discontinuing stress dose steroids 2/09 change to pressure control  Consults:  Neurology  Infectious disease consulted 2/8   Procedures:  ETT 2/2 >>  CVC (backed out but still in innominate vein on CXR)   Significant Diagnostic Tests:  CT Head 2/2 >> motion degraded study, age-indeterminate infarct involving the R frontal lobe CTA Head / Neck / Perfusion 2/2 >> no emergent large vessel occlusion, normal cerebral CT perfusion EEG 2/03 >> continuous slowing MRI brain 2/03 >> multiple small acute infarcts b/l in anterior and posterior circulations Echo 2/05 >> dilated RV with reduced systolic function and evidence of overload, LVEF 35 to 40%  with global hypokinesis CT angio chest 2/06 >> b/l PE, RV:LV 1.3, atherosclerosis, centrilobular emphysema, basilar ASD  Micro Data:  MRSA PCR 2/3 >> positive  UC 2/2 >> MRSA 100k CSF 2/4 >> NG final BCx2 2/2 >> NG final  resp cx 2/5>> MRSA (R erythromycin), few candida albicans  Antimicrobials:  Ampicillin 2/2 >> 2/5 Cefepime 2/2 >> 2/5 Ceftriaxone 2/2 >> 2/4 Flagyl 2/2 >> 2/5 Vancomycin 2/2>>>  Interim history/subjective:  2/12: overnight had tachycardia and diaphoresis with increased rr. Increased sedation with improvement in symptoms. tmax 99.5. this am placed on cpap and was increased rr/tachycardia and diaphoretic. Then had emesis. Tf held and changed back to ac around 0930 2/11: can arouse but intermittently following commands. Tolerated cpap for very very brief time yesterday 5-10 mins. Then decompensated with desat and tachycardia. This am on cpap with some mild tachypnea.  2/10:  No acute events overnight. Remains vented and sedated.  2/9:Uncomfortable on vent.  Bradycardia with precedex overnight.  Objective   Blood pressure 140/67, pulse 73, temperature 98.1 F (36.7 C), temperature source Axillary, resp. rate (!) 26, height 5\' 8"  (1.727 m), weight 93.5 kg, SpO2 96 %.    Vent Mode: PCV FiO2 (%):  [30 %-40 %] 40 % Set Rate:  [18 bmp] 18 bmp PEEP:  [5 cmH20] 5 cmH20 Plateau Pressure:  [19 cmH20-23 cmH20] 22 cmH20   Intake/Output Summary (Last 24 hours) at 09/28/2019 0835 Last data filed at 09/28/2019 0700 Gross per 24 hour  Intake 1824 ml  Output 3300 ml  Net -1476 ml   Filed Weights   09/26/19 0500 09/27/19 0500 09/28/19 0426  Weight: 90.4  kg 94.3 kg 93.5 kg    Examination:  General -awake appears acutely ill. Diaphoretic and tachycardic  Eyes - pupils reactive, eomi, scleral edema on R ENT - ETT in place, oral cavity with emesis Cardiac: tachycardic/reg rhythm, no murmur Chest - accessory muscle use, increased wob, tachypneic Abdomen - soft, non tender, +  bowel sounds Extremities - no cyanosis, clubbing, + edema Skin - no rashes Neuro - awake, not following commands appears in acute distress.    Resolved Hospital Problem list   None anion gap metabolic acidosis, Acute kidney failure, creatinine normalized on 2/8, Relative adrenal insufficiency, thrombocytopenia from sepsis  Assessment & Plan:   Acute hypoxic respiratory failure from MRSA HCAP with hx of severe COPD/emphysema and COVID 19 pneumonia. - change to pressure control 2/09 - CXR with worsening bibasilar infiltrates, personally reviewed by me - scheduled BDs -pt will likely need trach. Have discussed with wife. At this time no one on service able to do so and no rush with only day 10 of vent, it appears Monday there will be staff able to do perc trach so if unable to make progress over weekend would recommend trach early next week.  -stat cxr and with emesis will get kub as well.   Acute pulmonary embolism with cor pulmonale in setting of recent COVID 19 infection. - continue heparin gtt for now until after anticipated trach early next week.   Bilateral anterior and posterior circulation infarcts likely from septic emboli. - continue ASA - per neuro can transition to oral a/c pending procedures.   Thrombocytopenia -Resolved Normocytic anemia:  -relatively stable -follow trend  Acute metabolic encephalopathy 2nd to sepsis and recent cva Hx of anxiety. - RASS goal 0 to -1  -lexapro -cont to minimize sedation for better weaning potential, however after d/w neuro feel like while cvas are small punctate he may have difficulty with protecting airway until more distal from events and rehab.   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 A flutter with RVR new onset >> back in sinus rhythm. Bradycardia from precedex. S/p MVR. -  monitor heart rhythm - hold lopressor for now  Hypernatremia. - free water -relatively stable ARF:  -indices rising 1.33  -follow and uop as well.   Hx of hypothyroidism. - continue synthroid  Hyperglycemia. - SSI with lantus  Emesis:  Hold tf for right now.  -state kub   Best practice:  Diet: tube feeds on hold for right now after emesis DVT prophylaxis: heparin gtt GI prophylaxis: protonix  Glucose control: SSI Mobility: BR Disposition: ICU Family communication: updated wife via phone   Labs:   CMP Latest Ref Rng & Units 09/28/2019 09/27/2019 09/26/2019  Glucose 70 - 99 mg/dL 126(H) 179(H) 118(H)  BUN 8 - 23 mg/dL 54(H) 52(H) 53(H)  Creatinine 0.61 - 1.24 mg/dL 1.36(H) 1.33(H) 1.25(H)  Sodium 135 - 145 mmol/L 145 148(H) 146(H)  Potassium 3.5 - 5.1 mmol/L 4.6 4.3 3.9  Chloride 98 - 111 mmol/L 114(H) 111 113(H)  CO2 22 - 32 mmol/L 23 24 24   Calcium 8.9 - 10.3 mg/dL 7.8(L) 7.8(L) 7.2(L)  Total Protein 6.5 - 8.1 g/dL 5.5(L) 5.3(L) -  Total Bilirubin 0.3 - 1.2 mg/dL 0.5 0.5 -  Alkaline Phos 38 - 126 U/L 76 65 -  AST 15 - 41 U/L 28 28 -  ALT 0 -  44 U/L 23 27 -    CBC Latest Ref Rng & Units 09/28/2019 09/27/2019 09/26/2019  WBC 4.0 - 10.5 K/uL 13.9(H) 15.8(H) 13.8(H)  Hemoglobin 13.0 - 17.0 g/dL 11.7(L) 12.1(L) 11.1(L)  Hematocrit 39.0 - 52.0 % 38.5(L) 38.8(L) 35.6(L)  Platelets 150 - 400 K/uL 187 172 140(L)    ABG    Component Value Date/Time   PHART 7.363 09/21/2019 1128   PCO2ART 36.6 09/21/2019 1128   PO2ART 75.0 (L) 09/21/2019 1128   HCO3 20.7 09/21/2019 1128   TCO2 22 09/21/2019 1128   ACIDBASEDEF 4.0 (H) 09/21/2019 1128   O2SAT 94.0 09/21/2019 1128    CBG (last 3)  Recent Labs    09/27/19 2317 09/28/19 0311 09/28/19 0729  GLUCAP 115* 142* 127*    Critical care time: The patient is critically ill with multiple organ systems failure and requires high complexity decision making for assessment and support, frequent evaluation and titration of  therapies, application of advanced monitoring technologies and extensive interpretation of multiple databases.  Critical care time 35 mins. This represents my time independent of the NPs time taking care of the pt. This is excluding procedures.    [facility] 09/28/2019, 8:35 AM

## 2019-09-29 LAB — CULTURE, BLOOD (ROUTINE X 2)
Culture: NO GROWTH
Culture: NO GROWTH
Special Requests: ADEQUATE
Special Requests: ADEQUATE

## 2019-09-29 LAB — COMPREHENSIVE METABOLIC PANEL
ALT: 25 U/L (ref 0–44)
AST: 28 U/L (ref 15–41)
Albumin: 1.5 g/dL — ABNORMAL LOW (ref 3.5–5.0)
Alkaline Phosphatase: 71 U/L (ref 38–126)
Anion gap: 13 (ref 5–15)
BUN: 59 mg/dL — ABNORMAL HIGH (ref 8–23)
CO2: 21 mmol/L — ABNORMAL LOW (ref 22–32)
Calcium: 7.9 mg/dL — ABNORMAL LOW (ref 8.9–10.3)
Chloride: 110 mmol/L (ref 98–111)
Creatinine, Ser: 1.51 mg/dL — ABNORMAL HIGH (ref 0.61–1.24)
GFR calc Af Amer: 50 mL/min — ABNORMAL LOW (ref >60)
GFR calc non Af Amer: 43 mL/min — ABNORMAL LOW (ref >60)
Glucose, Bld: 144 mg/dL — ABNORMAL HIGH (ref 70–99)
Potassium: 5 mmol/L (ref 3.5–5.1)
Sodium: 144 mmol/L (ref 135–145)
Total Bilirubin: 0.9 mg/dL (ref 0.3–1.2)
Total Protein: 5.3 g/dL — ABNORMAL LOW (ref 6.5–8.1)

## 2019-09-29 LAB — CBC
HCT: 40.6 % (ref 39.0–52.0)
Hemoglobin: 12.2 g/dL — ABNORMAL LOW (ref 13.0–17.0)
MCH: 30.3 pg (ref 26.0–34.0)
MCHC: 30 g/dL (ref 30.0–36.0)
MCV: 101 fL — ABNORMAL HIGH (ref 80.0–100.0)
Platelets: 166 10*3/uL (ref 150–400)
RBC: 4.02 MIL/uL — ABNORMAL LOW (ref 4.22–5.81)
RDW: 17.8 % — ABNORMAL HIGH (ref 11.5–15.5)
WBC: 17.8 10*3/uL — ABNORMAL HIGH (ref 4.0–10.5)
nRBC: 0.2 % (ref 0.0–0.2)

## 2019-09-29 LAB — GLUCOSE, CAPILLARY
Glucose-Capillary: 124 mg/dL — ABNORMAL HIGH (ref 70–99)
Glucose-Capillary: 115 mg/dL — ABNORMAL HIGH (ref 70–99)
Glucose-Capillary: 109 mg/dL — ABNORMAL HIGH (ref 70–99)
Glucose-Capillary: 115 mg/dL — ABNORMAL HIGH (ref 70–99)
Glucose-Capillary: 139 mg/dL — ABNORMAL HIGH (ref 70–99)
Glucose-Capillary: 136 mg/dL — ABNORMAL HIGH (ref 70–99)

## 2019-09-29 LAB — MAGNESIUM: Magnesium: 2.1 mg/dL (ref 1.7–2.4)

## 2019-09-29 LAB — HEPARIN LEVEL (UNFRACTIONATED)
Heparin Unfractionated: 0.24 IU/mL — ABNORMAL LOW (ref 0.30–0.70)
Heparin Unfractionated: 0.31 IU/mL (ref 0.30–0.70)
Heparin Unfractionated: 0.21 IU/mL — ABNORMAL LOW (ref 0.30–0.70)

## 2019-09-29 LAB — PHOSPHORUS: Phosphorus: 5.1 mg/dL — ABNORMAL HIGH (ref 2.5–4.6)

## 2019-09-29 MED ORDER — HEPARIN BOLUS VIA INFUSION
1000.0000 [IU] | Freq: Once | INTRAVENOUS | Status: AC
  Administered 2019-09-29: 1000 [IU] via INTRAVENOUS
  Filled 2019-09-29: qty 1000

## 2019-09-29 NOTE — Progress Notes (Signed)
[place] for Heparin Indication: acute pulmonary embolus 09/22/19  Patient Measurements: Height: 5\' 8"  (172.7 cm)(Per family) Weight: 208 lb 1.8 oz (94.4 kg) IBW/kg (Calculated) : 68.4 Heparin Dosing Weight: 85 kg  Vital Signs: Temp: 99.1 F (37.3 C) (02/13 1159) Temp Source: Oral (02/13 1159) BP: 140/63 (02/13 0800) Pulse Rate: 47 (02/13 0800)  Labs: Recent Labs    09/27/19 0520 09/27/19 0700 09/28/19 0246 09/28/19 1112 09/28/19 1844 09/29/19 0232 09/29/19 1124  HGB 12.1*   < > 11.7*  --   --  12.2*  --   HCT 38.8*  --  38.5*  --   --  40.6  --   PLT 172  --  187  --   --  166  --   HEPARINUNFRC  --    < > 0.16*   < > <0.10* 0.24* 0.31  CREATININE 1.33*  --  1.36*  --   --  1.51*  --    < > = values in this interval not displayed.    Assessment: 79 year old male with submassive acute bilateral PE 09/22/19, moderate clot burden, positive for R heart strain with RV/LV 1.3.  2/4 Lower extremity dopplers are negative for DVT, repeat 2/6 pending. TTE negative for LV thrombus. TEE on hold - needs barium study.  CT Head - no hemorrhage. MRI with multiple infarcts, chronic CVA and chronic microhemorrhage.   Heparin level therapeutic on 1700 units/hr, it is on the lower range so I will bump his rate up slightly to 1750 units/hr and recheck a level in 6 hours   Goal of Therapy:  Heparin level 0.3-0.7 units/ml - Discussed with MD, will keep higher goal despite strokes due to clinical worsening and high risk of PE - try to keep ~0.5 Monitor platelets by anticoagulation protocol: Yes   Plan:  -Inc heparin to 1750 units/hr -Re-check heparin level at 2000 -Daily HL, CBC -Monitor for s/sx of bleeding  Nicoletta Dress, PharmD PGY2 Infectious Disease Pharmacy Resident

## 2019-09-29 NOTE — Progress Notes (Signed)
ANTICOAGULATION CONSULT NOTE   Pharmacy Consult for Heparin Indication: acute pulmonary embolus 09/22/19  Patient Measurements: Height: 5\' 8"  (172.7 cm)(Per family) Weight: 206 lb 2.1 oz (93.5 kg) IBW/kg (Calculated) : 68.4 Heparin Dosing Weight: 85 kg  Vital Signs: Temp: 98.4 F (36.9 C) (02/13 0000) Temp Source: Oral (02/13 0000) BP: 116/50 (02/13 0300) Pulse Rate: 45 (02/13 0300)  Labs: Recent Labs    09/27/19 0520 09/27/19 0700 09/28/19 0246 09/28/19 0246 09/28/19 1112 09/28/19 1844 09/29/19 0232  HGB 12.1*   < > 11.7*  --   --   --  12.2*  HCT 38.8*  --  38.5*  --   --   --  40.6  PLT 172  --  187  --   --   --  166  HEPARINUNFRC  --    < > 0.16*   < > 0.21* <0.10* 0.24*  CREATININE 1.33*  --  1.36*  --   --   --  1.51*   < > = values in this interval not displayed.    Assessment: 79 year old male with submassive acute bilateral PE 09/22/19, moderate clot burden, positive for R heart strain with RV/LV 1.3.  2/4 Lower extremity dopplers are negative for DVT, repeat 2/6 pending. TTE negative for LV thrombus. TEE on hold - needs barium study.  CT Head - no hemorrhage. MRI with multiple infarcts, chronic CVA and chronic microhemorrhage.   2/13 AM update:  Heparin level sub-therapeutic but trending up Heparin now running through midline Hgb stable   Goal of Therapy:  Heparin level 0.3-0.7 units/ml - Discussed with MD, will keep higher goal despite strokes due to clinical worsening and high risk of PE - try to keep ~0.5 Monitor platelets by anticoagulation protocol: Yes   Plan:  -Inc heparin to 1700 units/hr -Re-check heparin level at 1200 -Daily HL, CBC -Monitor for s/sx of bleeding  Narda Bonds, PharmD, BCPS Clinical Pharmacist Phone: [phone]

## 2019-09-29 NOTE — Progress Notes (Signed)
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 Per wife he fell overnight while trying to get out of bed on his own.  AM of admit was found altered and not speaking by his family.    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  Bradycardic on Precedex.  Atrial flutter on telemetry.  Heart rate as high as 160s on norepinephrine, changed to phenylephrine  2/07 requiring metoprolol for rapid heart rhythm.  Digoxin loaded.  2/7 improved neurologically. 2/08 Infectious disease consulted, Lantus increased, renal function normalized, white blood cell count trending down, hemoglobin stable.  Off pressors still tachycardic, tachypneic.  Added Lopressor scheduled via tube, resuming Precedex, discontinuing stress dose steroids 2/09 change to pressure control  Consults:  Neurology  Infectious disease consulted 2/8   Procedures:  ETT 2/2 >>  CVC (backed out but still in innominate vein on CXR)   Significant Diagnostic Tests:  CT Head 2/2 >> motion degraded study, age-indeterminate infarct involving the R frontal lobe CTA Head / Neck / Perfusion 2/2 >> no emergent large vessel occlusion, normal cerebral CT perfusion EEG 2/03 >> continuous slowing MRI brain 2/03 >> multiple small acute infarcts b/l in anterior and posterior circulations Echo 2/05 >> dilated RV with reduced systolic function and evidence of overload, LVEF 35 to 40%  with global hypokinesis CT angio chest 2/06 >> b/l PE, RV:LV 1.3, atherosclerosis, centrilobular emphysema, basilar ASD  Micro Data:  MRSA PCR 2/3 >> positive  UC 2/2 >> MRSA 100k CSF 2/4 >> NG final BCx2 2/2 >> NG final  resp cx 2/5>> MRSA (R erythromycin), few candida albicans  Antimicrobials:  Ampicillin 2/2 >> 2/5 Cefepime 2/2 >> 2/5 Ceftriaxone 2/2 >> 2/4 Flagyl 2/2 >> 2/5 Vancomycin 2/2>>>  Interim history/subjective:  On propofol 10 Tolerating pressure support 8, PEEP 5  Objective   Blood pressure 140/63, pulse (!) 47, temperature 98.6 F (37 C), temperature source Oral, resp. rate (!) 21, height 5\' 8"  (1.727 m), weight 94.4 kg, SpO2 95 %.    Vent Mode: PSV;CPAP FiO2 (%):  [40 %] 40 % Set Rate:  [18 bmp] 18 bmp PEEP:  [5 cmH20] 5 cmH20 Pressure Support:  [8 cmH20] 8 cmH20 Plateau Pressure:  [20 cmH20-22 cmH20] 21 cmH20   Intake/Output Summary (Last 24 hours) at 09/29/2019 1017 Last data filed at 09/29/2019 0800 Gross per 24 hour  Intake 4280.61 ml  Output 1025 ml  Net 3255.61 ml   Filed Weights   09/27/19 0500 09/28/19 0426 09/29/19 0500  Weight: 94.3 kg 93.5 kg 94.4 kg    Examination:  General acute and chronically ill, diaphoretic, on pressure support Eyes -ET tube in good position, oropharynx otherwise clear, some oral secretions ENT -ET tube in place Cardiac: Regular, distant, no murmur Chest -coarse bilaterally, no wheezing Abdomen -nondistended, hypoactive bowel sounds Extremities -trace bilateral pretibial edema Skin -no rash Neuro -wakes to voice, opens eyes and may have tracked.  Did not follow commands  Resolved Hospital Problem list   None anion gap metabolic acidosis, Acute kidney failure, creatinine normalized on 2/8, Relative adrenal insufficiency, thrombocytopenia from sepsis  Assessment & Plan:   Acute hypoxic respiratory failure from MRSA HCAP with hx of severe COPD/emphysema and COVID 19 pneumonia. Currently tolerating PSV but  mental status precludes extubation Okay to use PCV as his maintenance ventilator mode Schedule BD Follow chest x-ray Suspect that he will require tracheostomy in order to be liberated from mechanical ventilation  Acute pulmonary embolism with cor pulmonale in setting of recent COVID 19 infection. Continue heparin infusion  Bilateral anterior and posterior circulation infarcts suspect from septic emboli. ASA  Thrombocytopenia -Resolved Normocytic anemia:  Following CBC  Acute metabolic encephalopathy 2nd to sepsis and recent cva Hx of anxiety. 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 A flutter with RVR new onset >> back in sinus rhythm. Bradycardia from precedex. S/p MVR. Following telemetry Careful with sedating medications, has been sensitive to Precedex  Hypernatremia. Continue current free water ARF:  Continue to follow urine output, BMP, I/O 17 L positive for entire hospitalization Hold off on diuresis 2/13, hope to reinitiate 2/14 as renal function will tolerate  Hx of hypothyroidism. Continue Synthroid  Hyperglycemia. Sliding-scale insulin as ordered Lantus as ordered  Emesis:  Attempt to restart tube feeding  Best practice:  Diet: Attempt to restart TF on 2/13 DVT prophylaxis: heparin gtt GI prophylaxis: protonix  Glucose control: SSI Mobility: BR Disposition: ICU Family communication:   Labs:   CMP Latest Ref Rng & Units 09/29/2019 09/28/2019 09/27/2019  Glucose 70 - 99 mg/dL 144(H) 126(H) 179(H)  BUN 8 - 23 mg/dL 59(H) 54(H) 52(H)  Creatinine 0.61 - 1.24 mg/dL 1.51(H) 1.36(H) 1.33(H)  Sodium 135 - 145 mmol/L 144 145 148(H)  Potassium 3.5 -  5.1 mmol/L 5.0 4.6 4.3  Chloride 98 - 111 mmol/L 110 114(H) 111  CO2 22 - 32 mmol/L 21(L) 23 24  Calcium 8.9 - 10.3 mg/dL 7.9(L) 7.8(L) 7.8(L)  Total Protein 6.5 - 8.1 g/dL 5.3(L) 5.5(L) 5.3(L)  Total Bilirubin 0.3 - 1.2 mg/dL 0.9 0.5 0.5  Alkaline Phos 38 - 126 U/L 71 76 65  AST 15 - 41 U/L 28 28 28   ALT 0 - 44 U/L 25 23 27     CBC Latest Ref Rng & Units 09/29/2019 09/28/2019 09/27/2019  WBC 4.0 - 10.5 K/uL 17.8(H) 13.9(H) 15.8(H)  Hemoglobin 13.0 - 17.0 g/dL 12.2(L) 11.7(L) 12.1(L)  Hematocrit 39.0 - 52.0 % 40.6 38.5(L) 38.8(L)  Platelets 150 - 400 K/uL 166 187 172    ABG    Component Value Date/Time   PHART 7.363 09/21/2019 1128   PCO2ART 36.6 09/21/2019 1128   PO2ART 75.0 (L) 09/21/2019 1128   HCO3 20.7 09/21/2019 1128   TCO2 22 09/21/2019 1128   ACIDBASEDEF 4.0 (H) 09/21/2019 1128   O2SAT 94.0 09/21/2019 1128    CBG (last 3)  Recent Labs    09/28/19 2346 09/29/19 0404 09/29/19 0749  GLUCAP 114* 124* 115*    Critical care time: The patient is critically ill with multiple organ systems failure and requires high complexity decision making for assessment and support, frequent evaluation and titration of therapies, application of advanced monitoring technologies and extensive  interpretation of multiple databases.  Critical care time 33 mins. This represents my time independent of the NPs time taking care of the pt. This is excluding procedures.     Baltazar Apo, MD, PhD 09/29/2019, 10:26 AM [facility] [phone] or if no answer [phone]

## 2019-09-29 NOTE — Progress Notes (Signed)
ANTICOAGULATION CONSULT NOTE   Pharmacy Consult for Heparin Indication: acute pulmonary embolus 09/22/19  Patient Measurements: Height: 5\' 8"  (172.7 cm)(Per family) Weight: 208 lb 1.8 oz (94.4 kg) IBW/kg (Calculated) : 68.4 Heparin Dosing Weight: 85 kg  Vital Signs: Temp: 100 F (37.8 C) (02/13 1951) Temp Source: Oral (02/13 1951) BP: 129/59 (02/13 1935) Pulse Rate: 114 (02/13 1935)  Labs: Recent Labs    09/27/19 0520 09/27/19 0700 09/28/19 0246 09/28/19 1112 09/29/19 0232 09/29/19 1124 09/29/19 2037  HGB 12.1*   < > 11.7*  --  12.2*  --   --   HCT 38.8*  --  38.5*  --  40.6  --   --   PLT 172  --  187  --  166  --   --   HEPARINUNFRC  --    < > 0.16*   < > 0.24* 0.31 0.21*  CREATININE 1.33*  --  1.36*  --  1.51*  --   --    < > = values in this interval not displayed.    Assessment: 79 year old male with submassive acute bilateral PE 09/22/19, moderate clot burden, positive for R heart strain with RV/LV 1.3.  2/4 Lower extremity dopplers are negative for DVT, repeat 2/6 pending. TTE negative for LV thrombus. TEE on hold - needs barium study.  CT Head - no hemorrhage. MRI with multiple infarcts, chronic CVA and chronic microhemorrhage.   Heparin level subtherapeutic again on recheck at 0.21, no issues per RN.   Goal of Therapy:  Heparin level 0.3-0.7 units/ml - Discussed with MD, will keep higher goal despite strokes due to clinical worsening and high risk of PE - try to keep ~0.5 Monitor platelets by anticoagulation protocol: Yes   Plan:  -Heparin 1000 unit bolus x1 then increase infusion to 1900 units/h -Recheck heparin level in [place], PharmD, BCPS Clinical Pharmacist [phone] Please check AMION for all [place] numbers 09/29/2019

## 2019-09-30 ENCOUNTER — Inpatient Hospital Stay (HOSPITAL_COMMUNITY): Payer: BC Managed Care – PPO

## 2019-09-30 DIAGNOSIS — J189 Pneumonia, unspecified organism: Secondary | ICD-10-CM | POA: Diagnosis not present

## 2019-09-30 DIAGNOSIS — A4102 Sepsis due to Methicillin resistant Staphylococcus aureus: Secondary | ICD-10-CM | POA: Diagnosis not present

## 2019-09-30 DIAGNOSIS — G9340 Encephalopathy, unspecified: Secondary | ICD-10-CM | POA: Diagnosis not present

## 2019-09-30 DIAGNOSIS — I6340 Cerebral infarction due to embolism of unspecified cerebral artery: Secondary | ICD-10-CM | POA: Diagnosis not present

## 2019-09-30 DIAGNOSIS — J9600 Acute respiratory failure, unspecified whether with hypoxia or hypercapnia: Secondary | ICD-10-CM | POA: Diagnosis not present

## 2019-09-30 DIAGNOSIS — J9601 Acute respiratory failure with hypoxia: Secondary | ICD-10-CM | POA: Diagnosis not present

## 2019-09-30 LAB — CBC
HCT: 36.3 % — ABNORMAL LOW (ref 39.0–52.0)
Hemoglobin: 10.9 g/dL — ABNORMAL LOW (ref 13.0–17.0)
MCH: 30.5 pg (ref 26.0–34.0)
MCHC: 30 g/dL (ref 30.0–36.0)
MCV: 101.7 fL — ABNORMAL HIGH (ref 80.0–100.0)
Platelets: 211 10*3/uL (ref 150–400)
RBC: 3.57 MIL/uL — ABNORMAL LOW (ref 4.22–5.81)
RDW: 17.3 % — ABNORMAL HIGH (ref 11.5–15.5)
WBC: 19.4 10*3/uL — ABNORMAL HIGH (ref 4.0–10.5)
nRBC: 0.1 % (ref 0.0–0.2)

## 2019-09-30 LAB — URINALYSIS, ROUTINE W REFLEX MICROSCOPIC
Glucose, UA: NEGATIVE mg/dL
Ketones, ur: NEGATIVE mg/dL
Nitrite: POSITIVE — AB
Protein, ur: 100 mg/dL — AB
Specific Gravity, Urine: 1.02 (ref 1.005–1.030)
pH: 6.5 (ref 5.0–8.0)

## 2019-09-30 LAB — URINALYSIS, MICROSCOPIC (REFLEX): RBC / HPF: >50 RBC/hpf (ref 0–5)

## 2019-09-30 LAB — BASIC METABOLIC PANEL
Anion gap: 10 (ref 5–15)
Anion gap: 10 (ref 5–15)
Anion gap: 7 (ref 5–15)
BUN: 75 mg/dL — ABNORMAL HIGH (ref 8–23)
BUN: 82 mg/dL — ABNORMAL HIGH (ref 8–23)
BUN: 84 mg/dL — ABNORMAL HIGH (ref 8–23)
CO2: 22 mmol/L (ref 22–32)
CO2: 22 mmol/L (ref 22–32)
CO2: 22 mmol/L (ref 22–32)
Calcium: 7.7 mg/dL — ABNORMAL LOW (ref 8.9–10.3)
Calcium: 7.7 mg/dL — ABNORMAL LOW (ref 8.9–10.3)
Calcium: 7.3 mg/dL — ABNORMAL LOW (ref 8.9–10.3)
Chloride: 102 mmol/L (ref 98–111)
Chloride: 106 mmol/L (ref 98–111)
Chloride: 108 mmol/L (ref 98–111)
Creatinine, Ser: 2.1 mg/dL — ABNORMAL HIGH (ref 0.61–1.24)
Creatinine, Ser: 2.5 mg/dL — ABNORMAL HIGH (ref 0.61–1.24)
Creatinine, Ser: 2.44 mg/dL — ABNORMAL HIGH (ref 0.61–1.24)
GFR calc Af Amer: 33 mL/min — ABNORMAL LOW (ref >60)
GFR calc Af Amer: 27 mL/min — ABNORMAL LOW (ref >60)
GFR calc Af Amer: 28 mL/min — ABNORMAL LOW (ref >60)
GFR calc non Af Amer: 29 mL/min — ABNORMAL LOW (ref >60)
GFR calc non Af Amer: 23 mL/min — ABNORMAL LOW (ref >60)
GFR calc non Af Amer: 24 mL/min — ABNORMAL LOW (ref >60)
Glucose, Bld: 186 mg/dL — ABNORMAL HIGH (ref 70–99)
Glucose, Bld: 149 mg/dL — ABNORMAL HIGH (ref 70–99)
Glucose, Bld: 133 mg/dL — ABNORMAL HIGH (ref 70–99)
Potassium: 5.6 mmol/L — ABNORMAL HIGH (ref 3.5–5.1)
Potassium: 5.7 mmol/L — ABNORMAL HIGH (ref 3.5–5.1)
Potassium: 5.6 mmol/L — ABNORMAL HIGH (ref 3.5–5.1)
Sodium: 134 mmol/L — ABNORMAL LOW (ref 135–145)
Sodium: 138 mmol/L (ref 135–145)
Sodium: 137 mmol/L (ref 135–145)

## 2019-09-30 LAB — POCT I-STAT 7, (LYTES, BLD GAS, ICA,H+H)
Acid-base deficit: 2 mmol/L (ref 0.0–2.0)
Bicarbonate: 24.9 mmol/L (ref 20.0–28.0)
Calcium, Ion: 1.19 mmol/L (ref 1.15–1.40)
HCT: 33 % — ABNORMAL LOW (ref 39.0–52.0)
Hemoglobin: 11.2 g/dL — ABNORMAL LOW (ref 13.0–17.0)
O2 Saturation: 87 %
Patient temperature: 99.2
Potassium: 5.6 mmol/L — ABNORMAL HIGH (ref 3.5–5.1)
Sodium: 135 mmol/L (ref 135–145)
TCO2: 26 mmol/L (ref 22–32)
pCO2 arterial: 53.1 mmHg — ABNORMAL HIGH (ref 32.0–48.0)
pH, Arterial: 7.281 — ABNORMAL LOW (ref 7.350–7.450)
pO2, Arterial: 62 mmHg — ABNORMAL LOW (ref 83.0–108.0)

## 2019-09-30 LAB — GLUCOSE, CAPILLARY
Glucose-Capillary: 154 mg/dL — ABNORMAL HIGH (ref 70–99)
Glucose-Capillary: 151 mg/dL — ABNORMAL HIGH (ref 70–99)
Glucose-Capillary: 148 mg/dL — ABNORMAL HIGH (ref 70–99)
Glucose-Capillary: 76 mg/dL (ref 70–99)
Glucose-Capillary: 131 mg/dL — ABNORMAL HIGH (ref 70–99)

## 2019-09-30 LAB — MAGNESIUM: Magnesium: 2.3 mg/dL (ref 1.7–2.4)

## 2019-09-30 LAB — HEPARIN LEVEL (UNFRACTIONATED): Heparin Unfractionated: 0.33 IU/mL (ref 0.30–0.70)

## 2019-09-30 LAB — LACTIC ACID, PLASMA: Lactic Acid, Venous: 0.9 mmol/L (ref 0.5–1.9)

## 2019-09-30 MED ORDER — SODIUM ZIRCONIUM CYCLOSILICATE 5 G PO PACK
10.0000 g | PACK | Freq: Three times a day (TID) | ORAL | Status: DC
  Administered 2019-09-30 – 2019-10-01 (×2): 10 g via ORAL
  Filled 2019-09-30 (×2): qty 2

## 2019-09-30 MED ORDER — PHENYLEPHRINE HCL-NACL 10-0.9 MG/250ML-% IV SOLN
0.0000 ug/min | INTRAVENOUS | Status: DC
  Administered 2019-09-30: 200 ug/min via INTRAVENOUS
  Administered 2019-09-30: 40 ug/min via INTRAVENOUS
  Administered 2019-09-30 (×4): 200 ug/min via INTRAVENOUS
  Administered 2019-09-30: 100 ug/min via INTRAVENOUS
  Administered 2019-09-30 (×2): 200 ug/min via INTRAVENOUS
  Administered 2019-10-01 (×2): 400 ug/min via INTRAVENOUS
  Administered 2019-10-01: 200 ug/min via INTRAVENOUS
  Administered 2019-10-01 (×2): 400 ug/min via INTRAVENOUS
  Administered 2019-10-01: 200 ug/min via INTRAVENOUS
  Filled 2019-09-30 (×9): qty 250
  Filled 2019-09-30: qty 500
  Filled 2019-09-30: qty 250
  Filled 2019-09-30: qty 500
  Filled 2019-09-30 (×4): qty 250

## 2019-09-30 MED ORDER — CHLORHEXIDINE GLUCONATE 0.12 % MT SOLN
OROMUCOSAL | Status: AC
  Filled 2019-09-30: qty 15

## 2019-09-30 MED ORDER — SODIUM POLYSTYRENE SULFONATE 15 GM/60ML PO SUSP
30.0000 g | Freq: Once | ORAL | Status: AC
  Administered 2019-09-30: 30 g
  Filled 2019-09-30: qty 120

## 2019-09-30 MED ORDER — VANCOMYCIN VARIABLE DOSE PER UNSTABLE RENAL FUNCTION (PHARMACIST DOSING): Status: DC

## 2019-09-30 NOTE — Progress Notes (Signed)
Pt attempts to squint eyes open to voice with decrease in sedation,  pt remains with no movement to noxious stimuli. Will titrate sedation back to previous dose d/t continued tachypnea.

## 2019-09-30 NOTE — Progress Notes (Signed)
PCCM interval note  I spoke with the patient's wife and daughter at bedside.  Reviewed his status, his interval decline hemodynamically and from a respiratory standpoint over the last 24 hours.  Also explained the rationale for stopping his heparin given the possible intracranial bleeding.  Underscore to my concern that chances now for a meaningful survival very poor.  They understand.  He has indicated them that he would want to live a vibrant life and that if the chances to do so become smaller and possible that he would likely not want this, support.  I did explain that he has evolving renal failure, that if he were to progress to fulminant renal failure that I do not believe initiation of hemodialysis is going to increase the likelihood that he survives.  For that reason I have not called nephrology, do not recommend starting hemodialysis.  They understand and agree.  They understand that the next 24-48 hours will be critical and that if he does not rebound the chances for survival become extremely small.  I believe that they would be open to discussion regarding possible withdrawal of care if he does not have a significant rebound soon.  Independent critical care time 40 minutes  Baltazar Apo, MD, PhD 09/30/2019, 6:19 PM [facility] [phone] or if no answer [phone]

## 2019-09-30 NOTE — Progress Notes (Signed)
PCCM interval note  Head CT reviewed.  Suspected punctate cerebral hemorrhage associated with his embolic stroke.  I stopped his heparin earlier in the day and we will not restart for now.  MRI/a was recommended and I will order.  Baltazar Apo, MD, PhD 09/30/2019, 12:29 PM  [facility] [phone] or if no answer [phone]

## 2019-09-30 NOTE — Procedures (Signed)
Arterial Catheter Insertion Procedure Note Chris Lewis [id number] 1941-02-23  Procedure: Insertion of Arterial Catheter  Indications: Blood pressure monitoring and Frequent blood sampling  Procedure Details Consent: Unable to obtain consent because of altered level of consciousness. Time Out: Verified patient identification, verified procedure, site/side was marked, verified correct patient position, special equipment/implants available, medications/allergies/relevent history reviewed, required imaging and test results available.  Performed  Maximum sterile technique was used including antiseptics, cap, gloves, gown, hand hygiene, mask and sheet. Skin prep: Chlorhexidine; local anesthetic administered 20 gauge catheter was inserted into right radial artery using the Seldinger technique. ULTRASOUND GUIDANCE USED: NO Evaluation Blood flow good; BP tracing good. Complications: No apparent complications.   Levora Dredge 09/30/2019

## 2019-09-30 NOTE — Progress Notes (Signed)
Pt transported to and from CT on the ventilator without incident. °

## 2019-09-30 NOTE — Progress Notes (Signed)
eLink Physician-Brief Progress Note Patient Name: Chris Lewis DOB: Aug 07, 1941 MRN: [id number]   Date of Service  09/30/2019  HPI/Events of Note  Hyperkalemia. K 5.6.  eICU Interventions  Start Lokelma 10mg  Q8H per OG tube x2 days.     Intervention Category Major Interventions: Electrolyte abnormality - evaluation and management  Charlott Rakes 09/30/2019, 10:21 PM

## 2019-09-30 NOTE — Progress Notes (Signed)
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 Per wife he fell overnight while trying to get out of bed on his own.  AM of admit was found altered and not speaking by his family.    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  Bradycardic on Precedex.  Atrial flutter on telemetry.  Heart rate as high as 160s on norepinephrine, changed to phenylephrine  2/07 requiring metoprolol for rapid heart rhythm.  Digoxin loaded.  2/7 improved neurologically. 2/08 Infectious disease consulted, Lantus increased, renal function normalized, white blood cell count trending down, hemoglobin stable.  Off pressors still tachycardic, tachypneic.  Added Lopressor scheduled via tube, resuming Precedex, discontinuing stress dose steroids 2/09 change to pressure control  Consults:  Neurology  Infectious disease consulted 2/8   Procedures:  ETT 2/2 >>  CVC (backed out but still in innominate vein on CXR)   Significant Diagnostic Tests:  CT Head 2/2 >> motion degraded study, age-indeterminate infarct involving the R frontal lobe CTA Head / Neck / Perfusion 2/2 >> no emergent large vessel occlusion, normal cerebral CT perfusion EEG 2/03 >> continuous slowing MRI brain 2/03 >> multiple small acute infarcts b/l in anterior and posterior circulations Echo 2/05 >> dilated RV with reduced systolic function and evidence of overload, LVEF 35 to 40%  with global hypokinesis CT angio chest 2/06 >> b/l PE, RV:LV 1.3, atherosclerosis, centrilobular emphysema, basilar ASD  Micro Data:  MRSA PCR 2/3 >> positive  UC 2/2 >> MRSA 100k CSF 2/4 >> NG final BCx2 2/2 >> NG final  resp cx 2/5>> MRSA (R erythromycin), few candida albicans  Antimicrobials:  Ampicillin 2/2 >> 2/5 Cefepime 2/2 >> 2/5 Ceftriaxone 2/2 >> 2/4 Flagyl 2/2 >> 2/5 Vancomycin 2/2>>>  Interim history/subjective:  Significant clinical change overnight in mental status, had been able to wake to voice and interact 2/13 but overnight obtunded, sedation decreased (had been on low-dose propofol, fentanyl) Lactic acid checked, 0.9 Acute desaturation event when he was placed on his left side for bath, FiO2 increased to 1.00.  Unclear whether SPO2 remains accurate based on waveform Mild respiratory acidosis on pressure control ventilation 2/14 Potassium 5.6, Kayexalate given  Objective   Blood pressure 96/63, pulse (!) 134, temperature 99.3 F (37.4 C), temperature source Axillary, resp. rate (!) 32, height 5\' 8"  (1.727 m), weight 94.4 kg, SpO2 (!) 89 %.    Vent Mode: PRVC FiO2 (%):  [40 %-100 %] 100 % Set Rate:  [18 bmp] 18 bmp Vt Set:  [450 mL] 450 mL PEEP:  [5 cmH20] 5 cmH20 Pressure Support:  [8 [id number] cmH20] 22 cmH20 Plateau Pressure:  [20 cmH20-22 cmH20] 22 cmH20   Intake/Output Summary (Last 24 hours) at 09/30/2019 0957 Last data filed at 09/30/2019 0900 Gross per 24 hour  Intake 2634.1 ml  Output 980 ml  Net 1654.1 ml   Filed Weights   09/27/19 0500 09/28/19 0426 09/29/19 0500  Weight: 94.3 kg 93.5 kg 94.4  kg    Examination:  General acute and chronically ill-appearing man, agonal respiratory pattern HEENT: Oropharynx moist, ET tube in good position with an apparent mild cuff leak, air in balloon Cardiac: Distant, tachycardic Chest -bilaterally Abdomen -nondistended, hypoactive bowel sounds Extremities trace to 1+ bilateral pretibial edema Skin -no  rash Neuro -obtunded, did not respond to pain or voice   Resolved Hospital Problem list   None anion gap metabolic acidosis, Acute kidney failure, creatinine normalized on 2/8, Relative adrenal insufficiency, thrombocytopenia from sepsis  Assessment & Plan:   Acute hypoxic respiratory failure from MRSA HCAP with hx of severe COPD/emphysema and COVID 19 pneumonia. On pressure control ventilation, evolving respiratory acidosis this morning.  Increased work of breathing and FiO2 increased to 1.00.  Check chest x-ray now, was not done this morning, suspect evolving infiltrates given risk for ARDS. Plan to change his ventilator strategy back to [facility], probably 6 cc/kg depending on the chest x-ray Scheduled bronchodilators Suspect that he will require tracheostomy to be liberated from mechanical ventilation.  May not be possible in the short-term given his FiO2 needs.  Predict he will need additional PEEP as well, deeper sedation  Acute pulmonary embolism with cor pulmonale in setting of recent COVID 19 infection. Heparin infusion  Bilateral anterior and posterior circulation infarcts suspect from septic emboli. ASA  Acute renal failure, progressive over last 48 hours Check UA 2/14 Follow BMP and urine output Renal dose medications including vancomycin Diuretics on hold (+19 L for the hospitalization)  Thrombocytopenia -Resolved Normocytic anemia:  Following CBC  Acute metabolic encephalopathy 2nd to sepsis and recent cva Hx of anxiety. Sedation held initially given his worsening mental status, has not awakened or interacted although now tachypneic and tachycardic.  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 A flutter with RVR new onset >> back in sinus rhythm. Bradycardia from precedex. S/p MVR. Follow telemetry Adjust sedation as above  Hypernatremia. Continue current free water  Diuresis deferred 2/13 and 2/14, following BMP  Hx of hypothyroidism. Continue Synthroid  Hyperglycemia. Sliding-scale insulin and Lantus as ordered  Emesis:  Tube feeding  Best practice:  Diet: Attempt to restart TF on 2/13 DVT prophylaxis: heparin gtt GI prophylaxis: protonix  Glucose control: SSI Mobility: BR Disposition: ICU Family communication: Updated patient's wife by phone 2/14.   Labs:   CMP Latest Ref Rng & Units 09/30/2019 09/30/2019 09/29/2019  Glucose 70 - 99 mg/dL - 186(H) 144(H)  BUN 8 - 23 mg/dL - 75(H) 59(H)  Creatinine 0.61 - 1.24 mg/dL - 2.10(H) 1.51(H)  Sodium 135 - 145 mmol/L 135 134(L) 144  Potassium 3.5 - 5.1 mmol/L 5.6(H) 5.6(H) 5.0  Chloride 98 - 111 mmol/L - 102 110  CO2 22 - 32 mmol/L - 22 21(L)  Calcium 8.9 - 10.3 mg/dL - 7.7(L) 7.9(L)  Total Protein 6.5 - 8.1 g/dL - - 5.3(L)  Total Bilirubin 0.3 - 1.2 mg/dL - - 0.9  Alkaline Phos 38 - 126 U/L - - 71  AST 15 - 41 U/L - - 28  ALT 0 - 44 U/L - -  25    CBC Latest Ref Rng & Units 09/30/2019 09/30/2019 09/29/2019  WBC 4.0 - 10.5 K/uL - 19.4(H) 17.8(H)  Hemoglobin 13.0 - 17.0 g/dL 11.2(L) 10.9(L) 12.2(L)  Hematocrit 39.0 - 52.0 % 33.0(L) 36.3(L) 40.6  Platelets 150 - 400 K/uL - 211 166    ABG    Component Value Date/Time   PHART 7.281 (L) 09/30/2019 0411   PCO2ART 53.1 (H) 09/30/2019 0411   PO2ART 62.0 (L) 09/30/2019 0411   HCO3 24.9 09/30/2019 0411   TCO2 26 09/30/2019 0411   ACIDBASEDEF 2.0 09/30/2019 0411   O2SAT 87.0 09/30/2019 0411    CBG (last 3)  Recent Labs    09/29/19 2314 09/30/19 0309 09/30/19 0742  GLUCAP 136* 154* 151*     Critical care time: The patient is critically ill with multiple organ systems failure and requires high complexity decision making for assessment and support, frequent evaluation and titration of therapies, application of advanced monitoring technologies and extensive interpretation of multiple databases.  Critical care time 35  mins. This represents my time independent of the NPs time taking care of the pt. This is excluding procedures.     Baltazar Apo, MD, PhD 09/30/2019, 9:57 AM [facility] [phone] or if no answer [phone]

## 2019-09-30 NOTE — Progress Notes (Addendum)
Called elink, informed Dr Oletta Darter day shift rn reports pt following simple commands and nods head to yes no questions. Pt now with no response to noxious stimuli. LLE more mottled and mottled skin now noted RLE. Tachypnea with guppy like breathing unchanged from previous shift. Discussed with md pt with urine incontinence, pronounced MSAD and stage I skin. MD agrees with need for indwelling foley. Unable to place foley d/t pt does not meet criteria. Will titrated sedation down and continue to assess neuro status.

## 2019-09-30 NOTE — Progress Notes (Signed)
Pt with desaturation to 60s when placed on left side position during bath. Fi02 increased to 100% and pt placed supine and high fowler position with no improvement. Pt bagged by this RN, RT paged for assist and new pulse ox prob applied. RT to bedside to suction and make vent changes, oxygen sats increased to 93%. Pt  remains tachypnic, versed 2 mg ivp given.

## 2019-09-30 NOTE — Progress Notes (Signed)
[facility] Progress Note Patient Name: Chris Lewis DOB: 12/15/1940 MRN: [id number]   Date of Service  09/30/2019  HPI/Events of Note  Multiple issues: 1. Change in mental status and 2. Mottled lower extremities. Pulses intact in lower extremities per bedside nurse.  eICU Interventions  Plan: 1. Cut sedation by 1/2. If LOC not improved in 2 hours, will need Head CT Scan without contrast.  2. Lactic Acid level now.      Intervention Category Major Interventions: Change in mental status - evaluation and management;Other:  Elaina Cara Cornelia Copa 09/30/2019, 1:15 AM

## 2019-09-30 NOTE — Progress Notes (Signed)
[facility] Progress Note Patient Name: Chris Lewis DOB: March 25, 1941 MRN: [id number]   Date of Service  09/30/2019  HPI/Events of Note  Nursing unable to draw labs. Request for A-line.   eICU Interventions  Will order: 1. RT to place A-line.      Intervention Category Major Interventions: Other:  Lysle Dingwall 09/30/2019, 2:36 AM

## 2019-09-30 NOTE — Progress Notes (Signed)
[facility] Progress Note Patient Name: Chris Lewis DOB: [birth year] MRN: [id number]   Date of Service  09/30/2019  HPI/Events of Note  K+ = 5.6.   eICU Interventions  Will order: 1. Kayexalate 30 gm per tube now. 2. Repeat BMP at 12 noon.      Intervention Category Major Interventions: Electrolyte abnormality - evaluation and management  Kynley Metzger [place] 09/30/2019, 6:01 AM

## 2019-10-01 ENCOUNTER — Inpatient Hospital Stay (HOSPITAL_COMMUNITY): Payer: BC Managed Care – PPO

## 2019-10-01 DIAGNOSIS — D72829 Elevated white blood cell count, unspecified: Secondary | ICD-10-CM

## 2019-10-01 DIAGNOSIS — J189 Pneumonia, unspecified organism: Secondary | ICD-10-CM | POA: Diagnosis not present

## 2019-10-01 DIAGNOSIS — I6340 Cerebral infarction due to embolism of unspecified cerebral artery: Secondary | ICD-10-CM | POA: Diagnosis not present

## 2019-10-01 DIAGNOSIS — R579 Shock, unspecified: Secondary | ICD-10-CM

## 2019-10-01 DIAGNOSIS — J9601 Acute respiratory failure with hypoxia: Secondary | ICD-10-CM | POA: Diagnosis not present

## 2019-10-01 DIAGNOSIS — A4102 Sepsis due to Methicillin resistant Staphylococcus aureus: Secondary | ICD-10-CM | POA: Diagnosis not present

## 2019-10-01 DIAGNOSIS — Z452 Encounter for adjustment and management of vascular access device: Secondary | ICD-10-CM | POA: Diagnosis not present

## 2019-10-01 DIAGNOSIS — J9600 Acute respiratory failure, unspecified whether with hypoxia or hypercapnia: Secondary | ICD-10-CM | POA: Diagnosis not present

## 2019-10-01 DIAGNOSIS — I2609 Other pulmonary embolism with acute cor pulmonale: Secondary | ICD-10-CM | POA: Diagnosis not present

## 2019-10-01 LAB — CBC
HCT: 35 % — ABNORMAL LOW (ref 39.0–52.0)
Hemoglobin: 10.3 g/dL — ABNORMAL LOW (ref 13.0–17.0)
MCH: 30.4 pg (ref 26.0–34.0)
MCHC: 29.4 g/dL — ABNORMAL LOW (ref 30.0–36.0)
MCV: 103.2 fL — ABNORMAL HIGH (ref 80.0–100.0)
Platelets: 255 10*3/uL (ref 150–400)
RBC: 3.39 MIL/uL — ABNORMAL LOW (ref 4.22–5.81)
RDW: 17.5 % — ABNORMAL HIGH (ref 11.5–15.5)
WBC: 26.6 10*3/uL — ABNORMAL HIGH (ref 4.0–10.5)
nRBC: 0.2 % (ref 0.0–0.2)

## 2019-10-01 LAB — COMPREHENSIVE METABOLIC PANEL
ALT: 21 U/L (ref 0–44)
AST: 25 U/L (ref 15–41)
Albumin: 1.1 g/dL — ABNORMAL LOW (ref 3.5–5.0)
Alkaline Phosphatase: 109 U/L (ref 38–126)
Anion gap: 12 (ref 5–15)
BUN: 76 mg/dL — ABNORMAL HIGH (ref 8–23)
CO2: 19 mmol/L — ABNORMAL LOW (ref 22–32)
Calcium: 6.7 mg/dL — ABNORMAL LOW (ref 8.9–10.3)
Chloride: 111 mmol/L (ref 98–111)
Creatinine, Ser: 2.49 mg/dL — ABNORMAL HIGH (ref 0.61–1.24)
GFR calc Af Amer: 27 mL/min — ABNORMAL LOW (ref >60)
GFR calc non Af Amer: 23 mL/min — ABNORMAL LOW (ref >60)
Glucose, Bld: 124 mg/dL — ABNORMAL HIGH (ref 70–99)
Potassium: 5.2 mmol/L — ABNORMAL HIGH (ref 3.5–5.1)
Sodium: 142 mmol/L (ref 135–145)
Total Bilirubin: 0.8 mg/dL (ref 0.3–1.2)
Total Protein: 5.6 g/dL — ABNORMAL LOW (ref 6.5–8.1)

## 2019-10-01 LAB — LACTIC ACID, PLASMA: Lactic Acid, Venous: 1 mmol/L (ref 0.5–1.9)

## 2019-10-01 LAB — GLUCOSE, CAPILLARY
Glucose-Capillary: 115 mg/dL — ABNORMAL HIGH (ref 70–99)
Glucose-Capillary: 130 mg/dL — ABNORMAL HIGH (ref 70–99)
Glucose-Capillary: 139 mg/dL — ABNORMAL HIGH (ref 70–99)
Glucose-Capillary: 82 mg/dL (ref 70–99)

## 2019-10-01 LAB — URINE CULTURE
Culture: NO GROWTH
Special Requests: NORMAL

## 2019-10-01 LAB — VANCOMYCIN, RANDOM: Vancomycin Rm: 30

## 2019-10-01 MED ORDER — MORPHINE 100MG IN NS 100ML (1MG/ML) PREMIX INFUSION
0.0000 mg/h | INTRAVENOUS | Status: DC
  Administered 2019-10-01: 5 mg/h via INTRAVENOUS
  Filled 2019-10-01: qty 100

## 2019-10-01 MED ORDER — ACETAMINOPHEN 325 MG PO TABS: 650.0000 mg | ORAL_TABLET | Freq: Four times a day (QID) | ORAL | Status: DC | PRN

## 2019-10-01 MED ORDER — GLYCOPYRROLATE 0.2 MG/ML IJ SOLN
0.2000 mg | INTRAMUSCULAR | Status: DC | PRN
  Filled 2019-10-01: qty 1

## 2019-10-01 MED ORDER — ACETAMINOPHEN 650 MG RE SUPP: 650.0000 mg | Freq: Four times a day (QID) | RECTAL | Status: DC | PRN

## 2019-10-01 MED ORDER — VASOPRESSIN 20 UNIT/ML IV SOLN
0.0300 [IU]/min | INTRAVENOUS | Status: DC
  Administered 2019-10-01: 0.03 [IU]/min via INTRAVENOUS
  Filled 2019-10-01: qty 2

## 2019-10-01 MED ORDER — MORPHINE BOLUS VIA INFUSION
5.0000 mg | INTRAVENOUS | Status: DC | PRN
  Filled 2019-10-01: qty 5

## 2019-10-01 MED ORDER — DIPHENHYDRAMINE HCL 50 MG/ML IJ SOLN: 25.0000 mg | INTRAMUSCULAR | Status: DC | PRN

## 2019-10-01 MED ORDER — MIDAZOLAM BOLUS VIA INFUSION (WITHDRAWAL LIFE SUSTAINING TX)
2.0000 mg | INTRAVENOUS | Status: DC | PRN
  Filled 2019-10-01: qty 2

## 2019-10-01 MED ORDER — DEXTROSE 5 % IV SOLN: INTRAVENOUS | Status: DC

## 2019-10-01 MED ORDER — MIDAZOLAM HCL 2 MG/2ML IJ SOLN: 1.0000 mg | INTRAMUSCULAR | Status: DC | PRN

## 2019-10-01 MED ORDER — GLYCOPYRROLATE 0.2 MG/ML IJ SOLN
0.2000 mg | INTRAMUSCULAR | Status: DC | PRN
  Administered 2019-10-01: 15:00:00 0.2 mg via INTRAVENOUS

## 2019-10-01 MED ORDER — GLYCOPYRROLATE 1 MG PO TABS: 1.0000 mg | ORAL_TABLET | ORAL | Status: DC | PRN

## 2019-10-01 MED ORDER — POLYVINYL ALCOHOL 1.4 % OP SOLN
1.0000 [drp] | Freq: Four times a day (QID) | OPHTHALMIC | Status: DC | PRN
  Filled 2019-10-01: qty 15

## 2019-10-01 MED ORDER — MIDAZOLAM 50MG/50ML (1MG/ML) PREMIX INFUSION: 0.0000 mg/h | INTRAVENOUS | Status: DC

## 2019-10-01 MED ORDER — PHENYLEPHRINE CONCENTRATED 100MG/250ML (0.4 MG/ML) INFUSION SIMPLE
0.0000 ug/min | INTRAVENOUS | Status: DC
  Administered 2019-10-01 (×2): 200 ug/min via INTRAVENOUS
  Filled 2019-10-01 (×2): qty 250

## 2019-10-01 MED ORDER — MORPHINE SULFATE (PF) 2 MG/ML IV SOLN: 2.0000 mg | INTRAVENOUS | Status: DC | PRN

## 2019-10-07 ENCOUNTER — Other Ambulatory Visit: Payer: Self-pay | Admitting: Internal Medicine

## 2019-10-15 ENCOUNTER — Other Ambulatory Visit: Payer: Self-pay

## 2019-10-15 NOTE — Death Summary Note (Signed)
DEATH SUMMARY   Patient Details  Name: Chris Lewis MRN: [id number] DOB: 03/14/1941  Admission/Discharge Information   Admit Date:  Sep 25, 2019  Date of Death: Date of Death: 10/08/19  Time of Death: Time of Death: Nov 15, 1523  Length of Stay: 2022/11/03  Referring Physician: Biagio Borg, MD   Reason(s) for Hospitalization  Patient was admitted 08/28/2019 with shortness of breath, nonproductive cough and chest pain  Diagnoses  Preliminary cause of death:   Hypoxemic respiratory failure Secondary Diagnoses (including complications and co-morbidities):  Active Problems:   Pneumonia   Pressure injury of skin   Cerebral embolism with cerebral infarction   Acute respiratory failure (HCC)   Shock ([place]) Acute kidney injury  Pulmonary embolism Biventricular congestive heart failure  Brief Hospital Course (including significant findings, care, treatment, and services provided and events leading to death)  Chris Lewis is a 79 y.o. year old male who admitted with worsening shortness of breath, cough, chest pain and discomfort. 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 Had a cerebrovascular accident, mental status deterioration was not completely explainable by a small CVA Had bilateral submassive PE felt to be provoked by coagulopathy related to Covid He was unable to wean off ventilator Significant mental status changes noted on 2/13, became obtunded Increasing pressor requirements  Family meeting had, patient will not want to be kept alive artificially with ongoing worsening prognosis  Decision was made to make patient comfort measures only  Patient succumbed to his illness at 1525 on 2019/10/08   Pertinent Labs and Studies  Significant Diagnostic Studies CT Code Stroke CTA Head W/WO contrast  Result Date: 09-25-2019 CLINICAL DATA:  Encephalopathy. COVID-19 EXAM: CT ANGIOGRAPHY HEAD AND NECK CT PERFUSION BRAIN TECHNIQUE: Multidetector CT imaging  of the head and neck was performed using the standard protocol during bolus administration of intravenous contrast. Multiplanar CT image reconstructions and MIPs were obtained to evaluate the vascular anatomy. Carotid stenosis measurements (when applicable) are obtained utilizing NASCET criteria, using the distal internal carotid diameter as the denominator. Multiphase CT imaging of the brain was performed following IV bolus contrast injection. Subsequent parametric perfusion maps were calculated using RAPID software. CONTRAST:  70mL OMNIPAQUE IOHEXOL 350 MG/ML SOLN COMPARISON:  None. FINDINGS: CTA NECK FINDINGS SKELETON: There is no bony spinal canal stenosis. No lytic or blastic lesion. OTHER NECK: Normal pharynx, larynx and major salivary glands. No cervical lymphadenopathy. Unremarkable thyroid gland. UPPER CHEST: No pneumothorax or pleural effusion. No nodules or masses. AORTIC ARCH: There is mild calcific atherosclerosis of the aortic arch. There is no aneurysm, dissection or hemodynamically significant stenosis of the visualized portion of the aorta. Conventional 3 vessel aortic branching pattern. The visualized proximal subclavian arteries are widely patent. RIGHT CAROTID SYSTEM: Normal without aneurysm, dissection or stenosis. LEFT CAROTID SYSTEM: Normal without aneurysm, dissection or stenosis. VERTEBRAL ARTERIES: Right dominant configuration. Both origins are clearly patent. There is no dissection, occlusion or flow-limiting stenosis to the skull base (V1-V3 segments). CTA HEAD FINDINGS POSTERIOR CIRCULATION: --Vertebral arteries: Normal V4 segments. --Posterior inferior cerebellar arteries (PICA): Patent origins from the vertebral arteries. --Anterior inferior cerebellar arteries (AICA): Patent origins from the basilar artery. --Basilar artery: Normal. --Superior cerebellar arteries: Normal. --Posterior cerebral arteries: Normal. Both originate from the basilar artery. Posterior communicating arteries  (p-comm) are diminutive or absent. ANTERIOR CIRCULATION: --Intracranial internal carotid arteries: Normal. --Anterior cerebral arteries (ACA): Normal. Both A1 segments are present. Patent anterior communicating artery (a-comm). --Middle cerebral arteries (MCA): Normal. VENOUS  SINUSES: As permitted by contrast timing, patent. ANATOMIC VARIANTS: None Review of the MIP images confirms the above findings. CT Brain Perfusion Findings: ASPECTS: 10 CBF (<30%) Volume: 65mL Perfusion (Tmax>6.0s) volume: 48mL Mismatch Volume: 66mL Infarction Location:None IMPRESSION: 1. No emergent large vessel occlusion or hemodynamically significant stenosis by NASCET criteria. 2. Normal cerebral CT perfusion. Electronically Signed   By: Ulyses Jarred M.D.   On: 10/14/2019 22:34   DG Abd 1 View  Result Date: 09/28/2019 CLINICAL DATA:  Abdominal distension EXAM: ABDOMEN - 1 VIEW COMPARISON:  None. FINDINGS: Gastric catheter is noted extending into the second portion of the duodenum. Scattered large and small bowel gas is noted. Rectal tube is seen. No obstructive changes are noted. No bony abnormality is seen. IMPRESSION: No acute abnormality noted. Electronically Signed   By: Inez Catalina M.D.   On: 09/28/2019 11:10   CT HEAD WO CONTRAST  Result Date: 09/30/2019 CLINICAL DATA:  Encephalopathy. Change in mental status. EXAM: CT HEAD WITHOUT CONTRAST TECHNIQUE: Contiguous axial images were obtained from the base of the skull through the vertex without intravenous contrast. COMPARISON:  Head CT dated 09/17/2019. FINDINGS: Brain: Mild generalized age related parenchymal volume loss with commensurate dilatation of the ventricles and sulci. Mild chronic small vessel ischemic changes within the deep periventricular white matter regions bilaterally. Probable punctate focus of acute parenchymal hemorrhage within the LEFT parietooccipital lobe (series 7, image 27; coronal series 5, image 47). Vascular: No hyperdense vessel or unexpected  calcification. Skull: Normal. Negative for fracture or focal lesion. Sinuses/Orbits: Chronic appearing mucosal thickening within the ethmoid air cells and LEFT sphenoid sinus. Chronic fluid within the bilateral mastoid air cells. Periorbital and retro-orbital soft tissues are unremarkable. Other: None. IMPRESSION: 1. Probable punctate focus of acute parenchymal hemorrhage within the LEFT parietooccipital lobe. Consider brain MRI and MRA to exclude hemorrhagic infarction. 2. Chronic small vessel ischemic changes in the white matter. These results will be called to the ordering clinician or representative by the Radiologist Assistant, and communication documented in the PACS or zVision Dashboard. Electronically Signed   By: Franki Cabot M.D.   On: 09/30/2019 12:06   CT Head Wo Contrast  Result Date: 10/02/2019 CLINICAL DATA:  Altered mental status. EXAM: CT HEAD WITHOUT CONTRAST TECHNIQUE: Contiguous axial images were obtained from the base of the skull through the vertex without intravenous contrast. COMPARISON:  None. FINDINGS: Brain: Evaluation was degraded by motion artifact. There is a 1.6 cm hypoattenuating area in the right frontal lobe (axial series 2, image 31). There is no acute intracranial abnormality. No acute intracranial hemorrhage. There is atrophy with chronic microvascular ischemic changes. Vascular: No hyperdense vessel or unexpected calcification. Skull: Normal. Negative for fracture or focal lesion. Sinuses/Orbits: There is mucosal thickening of the left maxillary sinus with an air-fluid level. There is a complete opacification of the left sphenoid sinus. There is mucosal thickening of the ethmoid air cells. The mastoid air cells are essentially clear. Other: None. IMPRESSION: 1. Motion degraded study. 2. 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 While this is favored to be chronic, if there is clinical concern for an acute stroke follow-up with MRI is recommended. 3. No  acute intracranial hemorrhage. 4. Atrophy and chronic microvascular ischemic changes are noted. 5. Sinus disease as detailed above. Electronically Signed   By: Constance Holster M.D.   On: 09/17/2019 20:24   CT Code Stroke CTA Neck W/WO contrast  Result Date: 10/05/2019 CLINICAL DATA:  Encephalopathy. COVID-19 EXAM: CT ANGIOGRAPHY HEAD AND NECK  CT PERFUSION BRAIN TECHNIQUE: Multidetector CT imaging of the head and neck was performed using the standard protocol during bolus administration of intravenous contrast. Multiplanar CT image reconstructions and MIPs were obtained to evaluate the vascular anatomy. Carotid stenosis measurements (when applicable) are obtained utilizing NASCET criteria, using the distal internal carotid diameter as the denominator. Multiphase CT imaging of the brain was performed following IV bolus contrast injection. Subsequent parametric perfusion maps were calculated using RAPID software. CONTRAST:  31mL OMNIPAQUE IOHEXOL 350 MG/ML SOLN COMPARISON:  None. FINDINGS: CTA NECK FINDINGS SKELETON: There is no bony spinal canal stenosis. No lytic or blastic lesion. OTHER NECK: Normal pharynx, larynx and major salivary glands. No cervical lymphadenopathy. Unremarkable thyroid gland. UPPER CHEST: No pneumothorax or pleural effusion. No nodules or masses. AORTIC ARCH: There is mild calcific atherosclerosis of the aortic arch. There is no aneurysm, dissection or hemodynamically significant stenosis of the visualized portion of the aorta. Conventional 3 vessel aortic branching pattern. The visualized proximal subclavian arteries are widely patent. RIGHT CAROTID SYSTEM: Normal without aneurysm, dissection or stenosis. LEFT CAROTID SYSTEM: Normal without aneurysm, dissection or stenosis. VERTEBRAL ARTERIES: Right dominant configuration. Both origins are clearly patent. There is no dissection, occlusion or flow-limiting stenosis to the skull base (V1-V3 segments). CTA HEAD FINDINGS POSTERIOR  CIRCULATION: --Vertebral arteries: Normal V4 segments. --Posterior inferior cerebellar arteries (PICA): Patent origins from the vertebral arteries. --Anterior inferior cerebellar arteries (AICA): Patent origins from the basilar artery. --Basilar artery: Normal. --Superior cerebellar arteries: Normal. --Posterior cerebral arteries: Normal. Both originate from the basilar artery. Posterior communicating arteries (p-comm) are diminutive or absent. ANTERIOR CIRCULATION: --Intracranial internal carotid arteries: Normal. --Anterior cerebral arteries (ACA): Normal. Both A1 segments are present. Patent anterior communicating artery (a-comm). --Middle cerebral arteries (MCA): Normal. VENOUS SINUSES: As permitted by contrast timing, patent. ANATOMIC VARIANTS: None Review of the MIP images confirms the above findings. CT Brain Perfusion Findings: ASPECTS: 10 CBF (<30%) Volume: 57mL Perfusion (Tmax>6.0s) volume: 61mL Mismatch Volume: 2mL Infarction Location:None IMPRESSION: 1. No emergent large vessel occlusion or hemodynamically significant stenosis by NASCET criteria. 2. Normal cerebral CT perfusion. Electronically Signed   By: Ulyses Jarred M.D.   On: 09/30/2019 22:34   CT ANGIO CHEST PE W OR WO CONTRAST  Result Date: 09/22/2019 CLINICAL DATA:  79 year old who was hospitalized with COVID-19 pneumonia from 08/28/2019 through 09/10/2019, now with ventilator dependent respiratory failure. He was readmitted on 09/28/2019 after acute mental status changes and a possible stroke. Current history of COPD, hypothyroidism and achalasia. Personal history of mitral valve replacement. EXAM: CT ANGIOGRAPHY CHEST WITH CONTRAST TECHNIQUE: Multidetector CT imaging of the chest was performed using the standard protocol during bolus administration of intravenous contrast. Multiplanar CT image reconstructions and MIPs were obtained to evaluate the vascular anatomy. CONTRAST:  23mL OMNIPAQUE IOHEXOL 350 MG/ML IV. COMPARISON:  08/28/2019 and  earlier. FINDINGS: Cardiovascular: Contrast opacification of the pulmonary arteries is excellent. Filling defect involving the LOWER LOBE pulmonary artery extending into several segmental and subsegmental branches. Filling defect involving the RIGHT interlobar pulmonary artery with extension into the RIGHT MIDDLE LOBE pulmonary artery and the RIGHT LOWER LOBE pulmonary artery and several segmental branches. Overall clot burden is moderate. RV/LV ratio measures 1.3, indicating RIGHT heart strain. Cardiac silhouette moderately enlarged. No pericardial effusion. Mild LAD coronary atherosclerosis. Mild atherosclerosis involving the thoracic and proximal abdominal aorta without evidence of aneurysm. Reflux of contrast from the RIGHT atrium into the intrahepatic IVC and hepatic veins. Mediastinum/Nodes: No pathologically enlarged mediastinal, hilar or axillary lymph nodes. No  mediastinal masses. Fluid-filled esophagus. Normal-appearing thyroid gland. Lungs/Pleura: Endotracheal tube in satisfactory position with its tip approximately 4-5 cm above the carina. Emphysematous changes throughout both lungs as noted previously. Residual scattered peripheral airspace opacities throughout both lungs when compared to the 08/28/2019 examination, with interval improvement. Densely calcified RIGHT LOWER LOBE granuloma as noted previously. Very small BILATERAL pleural effusions. Upper Abdomen: Nasogastric tube courses into the stomach. Visualized upper abdomen unremarkable. Musculoskeletal: Multilevel degenerative disc disease and spondylosis involving the mid and lower thoracic spine. No acute findings. Review of the MIP images confirms the above findings. IMPRESSION: 1. Acute BILATERAL pulmonary emboli. Overall clot burden is moderate. 2. CT evidence of RIGHT heart strain (RV/LV Ratio = 1.3) indicating at least submassive (intermediate risk) PE. The presence of right heart strain has been associated with an increased risk of  morbidity and mortality. 3. Residual scattered peripheral airspace opacities throughout both lungs since the 08/28/2019 CT, indicating persistent pneumonia, with interval improvement. 4. Very small BILATERAL pleural effusions. Aortic Atherosclerosis (ICD10-I70.0) and Emphysema (ICD10-J43.9). These results will be called to the ordering clinician or representative by the Radiologist Assistant, and communication documented in the PACS or zVision Dashboard. Electronically Signed   By: Evangeline Dakin M.D.   On: 09/22/2019 15:01   MR BRAIN WO CONTRAST  Result Date: 09/19/2019 CLINICAL DATA:  Encephalopathy, COVID-19 EXAM: MRI HEAD WITHOUT CONTRAST TECHNIQUE: Multiplanar, multiecho pulse sequences of the brain and surrounding structures were obtained without intravenous contrast. COMPARISON:  Correlation made with recent CT imaging FINDINGS: Brain: Multiple small foci reduced diffusion are present involving bilateral cerebral and cerebellar hemispheres. There is no evidence intracranial hemorrhage. Chronic infarction involving the right middle frontal gyrus. Additional patchy T2 hyperintensity in the supratentorial white matter is nonspecific but may reflect mild chronic microvascular ischemic changes. Prominence of the ventricles and sulci reflects mild generalized parenchymal volume loss. Punctate focus of susceptibility in the parasagittal left parietal lobe is compatible with chronic microhemorrhage. There is no intracranial mass, mass effect, or edema. There is no hydrocephalus or extra-axial fluid collection. Vascular: Major vessel flow voids at the skull base are preserved. Skull and upper cervical spine: Normal marrow signal is preserved. Sinuses/Orbits: Near total opacification of the sphenoid sinus. Additional left posterior ethmoid and maxillary sinus partial opacification. Bilateral lens replacements. Other: Sella is unremarkable.  Patchy mastoid fluid opacification. IMPRESSION: Multiple small acute  infarcts involving bilateral anterior and posterior circulations. Chronic right frontal infarct. Mild chronic microvascular ischemic changes. Electronically Signed   By: Macy Mis M.D.   On: 09/19/2019 13:48   CT Code Stroke Cerebral Perfusion with contrast  Result Date: 09/19/2019 CLINICAL DATA:  Encephalopathy. COVID-19 EXAM: CT ANGIOGRAPHY HEAD AND NECK CT PERFUSION BRAIN TECHNIQUE: Multidetector CT imaging of the head and neck was performed using the standard protocol during bolus administration of intravenous contrast. Multiplanar CT image reconstructions and MIPs were obtained to evaluate the vascular anatomy. Carotid stenosis measurements (when applicable) are obtained utilizing NASCET criteria, using the distal internal carotid diameter as the denominator. Multiphase CT imaging of the brain was performed following IV bolus contrast injection. Subsequent parametric perfusion maps were calculated using RAPID software. CONTRAST:  59mL OMNIPAQUE IOHEXOL 350 MG/ML SOLN COMPARISON:  None. FINDINGS: CTA NECK FINDINGS SKELETON: There is no bony spinal canal stenosis. No lytic or blastic lesion. OTHER NECK: Normal pharynx, larynx and major salivary glands. No cervical lymphadenopathy. Unremarkable thyroid gland. UPPER CHEST: No pneumothorax or pleural effusion. No nodules or masses. AORTIC ARCH: There is mild calcific atherosclerosis of  the aortic arch. There is no aneurysm, dissection or hemodynamically significant stenosis of the visualized portion of the aorta. Conventional 3 vessel aortic branching pattern. The visualized proximal subclavian arteries are widely patent. RIGHT CAROTID SYSTEM: Normal without aneurysm, dissection or stenosis. LEFT CAROTID SYSTEM: Normal without aneurysm, dissection or stenosis. VERTEBRAL ARTERIES: Right dominant configuration. Both origins are clearly patent. There is no dissection, occlusion or flow-limiting stenosis to the skull base (V1-V3 segments). CTA HEAD FINDINGS  POSTERIOR CIRCULATION: --Vertebral arteries: Normal V4 segments. --Posterior inferior cerebellar arteries (PICA): Patent origins from the vertebral arteries. --Anterior inferior cerebellar arteries (AICA): Patent origins from the basilar artery. --Basilar artery: Normal. --Superior cerebellar arteries: Normal. --Posterior cerebral arteries: Normal. Both originate from the basilar artery. Posterior communicating arteries (p-comm) are diminutive or absent. ANTERIOR CIRCULATION: --Intracranial internal carotid arteries: Normal. --Anterior cerebral arteries (ACA): Normal. Both A1 segments are present. Patent anterior communicating artery (a-comm). --Middle cerebral arteries (MCA): Normal. VENOUS SINUSES: As permitted by contrast timing, patent. ANATOMIC VARIANTS: None Review of the MIP images confirms the above findings. CT Brain Perfusion Findings: ASPECTS: 10 CBF (<30%) Volume: 40mL Perfusion (Tmax>6.0s) volume: 31mL Mismatch Volume: 52mL Infarction Location:None IMPRESSION: 1. No emergent large vessel occlusion or hemodynamically significant stenosis by NASCET criteria. 2. Normal cerebral CT perfusion. Electronically Signed   By: Ulyses Jarred M.D.   On: 09/26/2019 22:34   DG Chest Port 1 View  Result Date: 2019/10/25 CLINICAL DATA:  Acute respiratory failure. Central line placement. EXAM: PORTABLE CHEST 1 VIEW COMPARISON:  Radiograph yesterday, CT 09/22/2019 FINDINGS: Endotracheal tube tip 5.4 cm from the carina. Enteric tube in place with tip below the diaphragm not included in the field of view. Right internal jugular central venous catheter tip in the mid SVC. No pneumothorax. Emphysema. Patchy bibasilar opacities have slightly worsened since yesterday. Calcified granuloma at the right lung base. Unchanged heart size and mediastinal contours. IMPRESSION: 1. Tip of the right internal jugular central venous catheter in the mid SVC. No pneumothorax. 2. Endotracheal tube and enteric tube remain in place. 3. Patchy  bibasilar opacities, slightly worsened since yesterday. Electronically Signed   By: Keith Rake M.D.   On: 10-25-19 05:12   DG Chest Port 1 View  Result Date: 09/30/2019 CLINICAL DATA:  Acute respiratory failure EXAM: PORTABLE CHEST 1 VIEW COMPARISON:  09/28/2019 FINDINGS: Endotracheal tube and gastric catheter are noted in satisfactory position. Cardiac shadow is stable. Patchy infiltrates are identified bilaterally particularly in the bases. Emphysematous changes are noted as well. No bony abnormality is seen. IMPRESSION: No change in bibasilar infiltrates. Electronically Signed   By: Inez Catalina M.D.   On: 09/30/2019 11:36   DG CHEST PORT 1 VIEW  Result Date: 09/28/2019 CLINICAL DATA:  Emesis EXAM: PORTABLE CHEST 1 VIEW COMPARISON:  Earlier today FINDINGS: Endotracheal tube tip is halfway between the clavicular heads and carina. The enteric tube reaches the stomach. Bilateral infiltrate superimposed on emphysema. Normal heart size with mild aortic tortuosity. No evidence of air leak IMPRESSION: Stable hardware positioning and bilateral infiltrates superimposed on emphysema. Electronically Signed   By: Monte Fantasia M.D.   On: 09/28/2019 11:02   DG CHEST PORT 1 VIEW  Result Date: 09/28/2019 CLINICAL DATA:  Acute hypoxemic respiratory failure EXAM: PORTABLE CHEST 1 VIEW COMPARISON:  Two days ago FINDINGS: The endotracheal tube tip is between the clavicular heads and carina. The enteric tube reaches the stomach. Bilateral interstitial and airspace opacity. There is hyperinflation. Apical emphysematous markings. There is no edema, consolidation, effusion, or pneumothorax.  IMPRESSION: Stable hardware positioning and bilateral infiltrates. Electronically Signed   By: Monte Fantasia M.D.   On: 09/28/2019 07:49   DG Chest Port 1 View  Result Date: 09/26/2019 CLINICAL DATA:  Respiratory failure EXAM: PORTABLE CHEST 1 VIEW COMPARISON:  09/24/2019 FINDINGS: Film is rotated to the right.  Endotracheal tube in similar position accounting for degree of rotation. Gastric tube courses through in off the field of the radiograph. Cardiomediastinal contours are stable accounting for degree of rotation as well. Basilar airspace disease persists, superimposed on background pulmonary emphysema. No signs of pleural effusion. Visualized skeletal structures are unremarkable. IMPRESSION: Persistent bibasilar airspace disease superimposed on background pulmonary emphysema. Electronically Signed   By: Zetta Bills M.D.   On: 09/26/2019 08:43   DG Chest Port 1 View  Result Date: 09/24/2019 CLINICAL DATA:  F/u Covid +, they are aware the central line is not in correct position, they have pulled back, and will re do another time, per rn EXAM: PORTABLE CHEST 1 VIEW COMPARISON:  Radiograph 09/22/2019 FINDINGS: Endotracheal tube, NG tube and central venous line unchanged. Central venous line remains in the brachycephalic vein. Normal cardiac silhouette. There is bibasilar patchy airspace disease not changed from prior. No pneumothorax. IMPRESSION: 1. Stable support apparatus. 2. Patchy bibasilar airspace disease again noted. Electronically Signed   By: Suzy Bouchard M.D.   On: 09/24/2019 10:37   DG CHEST PORT 1 VIEW  Result Date: 09/22/2019 CLINICAL DATA:  Central line placement EXAM: PORTABLE CHEST 1 VIEW COMPARISON:  September 19, 2019 FINDINGS: There is stable endotracheal tube and nasogastric tube positioning. A left internal jugular venous catheter is seen. Its distal tip is seen overlying the superior mediastinum on the left, just above the aortic arch. Mild infiltrates are seen within the bilateral lung bases. These are stable in appearance. There is no evidence of a pleural effusion or pneumothorax. The cardiac silhouette is mildly enlarged and unchanged in size. The visualized skeletal structures are unremarkable. IMPRESSION: 1. Interval left internal jugular venous catheter replacement versus  repositioning when compared to the prior chest plain film dated September 19, 2019. 2. Resolution of the small left apical pneumothorax seen on the prior study. 3. Stable bibasilar infiltrates. Electronically Signed   By: Virgina Norfolk M.D.   On: 09/22/2019 17:07   DG CHEST PORT 1 VIEW  Result Date: 09/19/2019 CLINICAL DATA:  Central line placement EXAM: PORTABLE CHEST 1 VIEW COMPARISON:  Yesterday FINDINGS: Left IJ line with tip at the upper SVC. Enteric and endotracheal tubes remain in good position. Airspace disease at both lung bases. Emphysema in the apical lungs. Cardiomegaly. These results will be called to the ordering clinician or representative by the Radiologist Assistant, and communication documented in the PACS or zVision Dashboard. IMPRESSION: 1. New left IJ line with tip at the upper SVC. Evidence of a trace left apical pneumothorax but this finding is stable from preprocedural radiograph yesterday and may be from patient's emphysema. Recommend radiographic follow-up. 2. Bilateral pneumonia without interval progression. 3. Cardiomegaly. Electronically Signed   By: Monte Fantasia M.D.   On: 09/19/2019 05:43   DG Chest Portable 1 View  Result Date: 10/04/2019 CLINICAL DATA:  Status post intubation EXAM: PORTABLE CHEST 1 VIEW COMPARISON:  Film from earlier in the same day. FINDINGS: Cardiac shadow is enlarged but stable. Endotracheal tube and gastric catheter are noted in satisfactory position. Patchy opacities are noted in both lungs similar to that seen on the prior exam. No pneumothorax or sizable effusion is  seen. No bony abnormality is noted. IMPRESSION: Stable appearance of the chest with the exception of interval endotracheal intubation and gastric catheter placement in satisfactory position. Electronically Signed   By: Inez Catalina M.D.   On: 09/29/2019 23:49   DG Chest Portable 1 View  Result Date: 09/26/2019 CLINICAL DATA:  Fever EXAM: PORTABLE CHEST 1 VIEW COMPARISON:  September 06, 2019 FINDINGS: The heart size and mediastinal contours are unchanged. Aortic knob calcifications. Slight interval increase in the patchy airspace opacity seen at both lung bases. There is increased interstitial markings seen at both lung bases. Hyperinflation of the upper lung zones is noted. No acute osseous abnormality. IMPRESSION: Slight interval increase in the patchy/interstitial markings at both lung bases which could be due to atelectasis and/or infectious etiology. Electronically Signed   By: Prudencio Pair M.D.   On: 10/07/2019 15:29   DG Chest Port 1 View  Result Date: 09/06/2019 CLINICAL DATA:  COVID-19 positivity EXAM: PORTABLE CHEST 1 VIEW COMPARISON:  08/27/2019 FINDINGS: Cardiac shadow is stable. Lungs are well aerated bilaterally. Increased bibasilar atelectasis is seen. No sizable effusion is noted. No bony abnormality is seen. IMPRESSION: Increased bibasilar atelectasis. Electronically Signed   By: Inez Catalina M.D.   On: 09/06/2019 08:28   EEG adult  Result Date: 09/19/2019 Lora Havens, MD     09/19/2019  3:39 PM Patient Name: SHERALD BINION MRN: [id number] Epilepsy Attending: Lora Havens Referring Physician/Provider: Dr. Kathrynn Speed Date: 09/19/2019 Duration: 22.31 mins Patient history: 79 year old male presented with acute encephalopathy in the setting of recent Covid pneumonia.  EEG to evaluate for seizures. Level of alertness: Comatose AEDs during EEG study: Versed Technical aspects: This EEG study was done with scalp electrodes positioned according to the 10-20 International system of electrode placement. Electrical activity was acquired at a sampling rate of 500Hz  and reviewed with a high frequency filter of 70Hz  and a low frequency filter of 1Hz . EEG data were recorded continuously and digitally stored. Description: EEG showed continuous generalized 2 to 3 Hz delta slowing as well as intermittent generalized, maximal frontocentral 13 to 15 Hz beta activity.  EEG was  reactive to tactile stimulation.  Hyperventilation photic stimulation were not performed due to AMS. Abnormality -Continuous slow, generalized IMPRESSION: This study is suggestive of severe diffuse encephalopathy, nonspecific etiology but could be secondary to sedation. No seizures or epileptiform discharges were seen throughout the recording. Priyanka O Yadav   VAS [place] LOWER EXTREMITY ARTERIAL DUPLEX  Result Date: 09/24/2019 LOWER EXTREMITY ARTERIAL DUPLEX STUDY Indications: Pallor. Cool extremities. COVID-19 positive.  Current ABI: Not obtained Limitations: Patient position Comparison Study: No prior study Performing Technologist: Maudry Mayhew MHA, RDMS, RVT, RDCS  Examination Guidelines: A complete evaluation includes B-mode imaging, spectral Doppler, color Doppler, and power Doppler as needed of all accessible portions of each vessel. Bilateral testing is considered an integral part of a complete examination. Limited examinations for reoccurring indications may be performed as noted.  +----------+--------+-----+--------+---------+--------+ RIGHT     PSV cm/sRatioStenosisWaveform Comments +----------+--------+-----+--------+---------+--------+ CFA Distal52                   triphasic         +----------+--------+-----+--------+---------+--------+ DFA       33                   triphasic         +----------+--------+-----+--------+---------+--------+ SFA Prox  75  triphasic         +----------+--------+-----+--------+---------+--------+ SFA Mid   53                   triphasic         +----------+--------+-----+--------+---------+--------+ SFA Distal59                   triphasic         +----------+--------+-----+--------+---------+--------+ POP Prox  58                   triphasic         +----------+--------+-----+--------+---------+--------+ POP Distal78                   triphasic          +----------+--------+-----+--------+---------+--------+ ATA Distal67                   triphasic         +----------+--------+-----+--------+---------+--------+ PTA Prox  87                   triphasic         +----------+--------+-----+--------+---------+--------+ PTA Distal83                   triphasic         +----------+--------+-----+--------+---------+--------+ PERO Mid  90                   triphasic         +----------+--------+-----+--------+---------+--------+ DP        43                   triphasic         +----------+--------+-----+--------+---------+--------+  +-----------+--------+-----+--------+---------+--------+ LEFT       PSV cm/sRatioStenosisWaveform Comments +-----------+--------+-----+--------+---------+--------+ CFA Distal 48                   triphasic         +-----------+--------+-----+--------+---------+--------+ DFA        45                   triphasic         +-----------+--------+-----+--------+---------+--------+ SFA Prox   60                   triphasic         +-----------+--------+-----+--------+---------+--------+ SFA Mid    139                  triphasic         +-----------+--------+-----+--------+---------+--------+ SFA Distal 55                   triphasic         +-----------+--------+-----+--------+---------+--------+ POP Prox   39                   triphasic         +-----------+--------+-----+--------+---------+--------+ POP Distal 46                   triphasic         +-----------+--------+-----+--------+---------+--------+ ATA Distal 20                   triphasic         +-----------+--------+-----+--------+---------+--------+ PTA Distal 94                   triphasic         +-----------+--------+-----+--------+---------+--------+  PERO Distal38                   triphasic         +-----------+--------+-----+--------+---------+--------+ DP         48                    triphasic         +-----------+--------+-----+--------+---------+--------+   Summary: Bilateral: No evidence of arterial occlusive disease.  Incidental finding: sonographic evidence of Baker's cyst in the left popliteal fossa. See table(s) above for measurements and observations. Electronically signed by Monica Martinez MD on 09/24/2019 at 4:20:02 PM.    Final    VAS [place] LOWER EXTREMITY VENOUS (DVT)  Result Date: 09/20/2019  Lower Venous DVT Study Indications: Covid-19.  Limitations: Ventilation. Comparison Study: No prior study on file Performing Technologist: Sharion Dove RVS  Examination Guidelines: A complete evaluation includes B-mode imaging, spectral Doppler, color Doppler, and power Doppler as needed of all accessible portions of each vessel. Bilateral testing is considered an integral part of a complete examination. Limited examinations for reoccurring indications may be performed as noted. The reflux portion of the exam is performed with the patient in reverse Trendelenburg.  +---------+---------------+---------+-----------+----------+--------------+ RIGHT    CompressibilityPhasicitySpontaneityPropertiesThrombus Aging +---------+---------------+---------+-----------+----------+--------------+ CFV      Full           Yes      No                                  +---------+---------------+---------+-----------+----------+--------------+ SFJ      Full                                                        +---------+---------------+---------+-----------+----------+--------------+ FV Prox  Full                                                        +---------+---------------+---------+-----------+----------+--------------+ FV Mid   Full                                                        +---------+---------------+---------+-----------+----------+--------------+ FV DistalFull                                                         +---------+---------------+---------+-----------+----------+--------------+ PFV      Full                                                        +---------+---------------+---------+-----------+----------+--------------+ POP      Full           Yes  Yes                                 +---------+---------------+---------+-----------+----------+--------------+ PTV      Full                                                        +---------+---------------+---------+-----------+----------+--------------+ PERO     Full                                                        +---------+---------------+---------+-----------+----------+--------------+   +---------+---------------+---------+-----------+----------+--------------+ LEFT     CompressibilityPhasicitySpontaneityPropertiesThrombus Aging +---------+---------------+---------+-----------+----------+--------------+ CFV      Full           No       No                                  +---------+---------------+---------+-----------+----------+--------------+ SFJ      Full                                                        +---------+---------------+---------+-----------+----------+--------------+ FV Prox  Full                                                        +---------+---------------+---------+-----------+----------+--------------+ FV Mid   Full                                                        +---------+---------------+---------+-----------+----------+--------------+ FV DistalFull                                                        +---------+---------------+---------+-----------+----------+--------------+ PFV      Full                                                        +---------+---------------+---------+-----------+----------+--------------+ POP      Full           Yes      Yes                                  +---------+---------------+---------+-----------+----------+--------------+ PTV      Full                                                        +---------+---------------+---------+-----------+----------+--------------+  PERO     Full                                                        +---------+---------------+---------+-----------+----------+--------------+     Summary: BILATERAL: - No evidence of deep vein thrombosis seen in the lower extremities, bilaterally. - No evidence of superficial venous thrombosis in the lower extremities, bilaterally.   *See table(s) above for measurements and observations. Electronically signed by Servando Snare MD on 09/20/2019 at 2:47:00 PM.    Final    ECHOCARDIOGRAM LIMITED  Result Date: 09/21/2019   ECHOCARDIOGRAM LIMITED REPORT   Patient Name:   JERMARIUS KOSA Date of Exam: 09/21/2019 Medical Rec #:  [id number]     Height:       68.0 in Accession #:    [id number]    Weight:       184.5 lb Date of Birth:  1941-01-31     BSA:          1.97 m Patient Age:    79 years      BP:           120/84 mmHg Patient Gender: M             HR:           90 bpm. Exam Location:  Inpatient  Procedure: Limited Echo, Limited Color Doppler and Cardiac Doppler Indications:    Cardiomegaly 429.3 / I51.7  History:        Patient has prior history of Echocardiogram examinations, most                 recent 09/19/2018. COVID 19. Acute Hypoxemic Respiratory Failure.                 Thrombocytopenia.  Sonographer:    Darlina Sicilian RDCS Referring Phys: [id number] [place] CHRISTOPHER  Sonographer Comments: Echo performed with patient supine and on artificial respirator. IMPRESSIONS  1. Limited echo with definity to exclude LV thrombus  2. Left ventricular ejection fraction, by visual estimation, is 35 to 40%.  3. Global right ventricle has severely reduced systolic function.The right ventricular size is severely enlarged.  4. Right ventricular volume and pressure overload.  5. Tricuspid valve  regurgitation moderate.  6. Moderately elevated pulmonary artery systolic pressure.  7. No LV thrombus seen FINDINGS  Left Ventricle: Left ventricular ejection fraction, by visual estimation, is 35 to 40%. The left ventricle demonstrates global hypokinesis. The interventricular septum is flattened in systole and diastole, consistent with right ventricular pressure and volume overload. Right Ventricle: The right ventricular size is severely enlarged. Global RV systolic function is has severely reduced systolic function. The tricuspid regurgitant velocity is 2.95 m/s, and with an assumed right atrial pressure of 8 mmHg, the estimated right ventricular systolic pressure is moderately elevated at 42.8 mmHg. Pericardium: There is no evidence of pericardial effusion is seen. There is no evidence of pericardial effusion. Tricuspid Valve: Tricuspid valve regurgitation moderate.   LV Volumes (MOD)             Normals LV area d, A2C:    29.40 cm LV area d, A4C:    31.60 cm LV area s, A2C:    21.80 cm LV area s, A4C:    24.00 cm LV major d, A2C:   7.95 cm LV  major d, A4C:   7.74 cm LV major s, A2C:   7.30 cm LV major s, A4C:   7.62 cm LV vol d, MOD A2C: 89.6 ml   68 ml LV vol d, MOD A4C: 106.0 ml LV vol s, MOD A2C: 54.7 ml   24 ml LV vol s, MOD A4C: 61.9 ml LV SV MOD A2C:     34.9 ml LV SV MOD A4C:     106.0 ml LV SV MOD BP:      39.3 ml   45 ml TRICUSPID VALVE             Normals TR Peak grad:   34.8 mmHg TR Vmax:        295.00 cm/s 288 cm/s  Oswaldo Milian MD Electronically signed by Oswaldo Milian MD Signature Date/Time: 09/21/2019/12:47:19 PM    Final    ECHOCARDIOGRAM LIMITED  Result Date: 09/20/2019   ECHOCARDIOGRAM LIMITED REPORT   Patient Name:   KEIMANI WAYMAN Date of Exam: 09/19/2019 Medical Rec #:  [id number]     Height:       68.0 in Accession #:    [id number]    Weight:       177.7 lb Date of Birth:  [birth year]     BSA:          1.94 m Patient Age:    27 years      BP:           103/78 mmHg Patient  Gender: M             HR:           79 bpm. Exam Location:  Inpatient  Procedure: Limited Echo, Limited Color Doppler and Color Doppler Indications:    Acute Myocardial Infarction 410  History:        Patient has prior history of Echocardiogram examinations, most                 recent 04/05/2019. Mitral Valve Repair 08/03/2011. [place], COPD,                 Covid-19 Positive  Sonographer:    Mikki Santee RDCS (AE) Referring Phys: [id number] [place]  1. Global right ventricle has severely reduced systolic function.The right ventricular size is severely enlarged. Right ventricular wall thickness was not assessed.  2. Left ventricular ejection fraction, by visual estimation, is 35%. The left ventricle has severely decreased function.  3. Left ventricular diastolic parameters are indeterminate.  4. Right atrial size was moderately dilated.  5. The mitral valve has been repaired, 08/03/2011. Annuloplasty ring appears normal, no evidence of dehiscence. Trivial mitral valve regurgitation. No evidence of mitral stenosis. Mean diastolic gradient 3 mmHg at HR 78 bpm.  6. Tricuspid valve regurgitation moderate.  7. Mildly elevated pulmonary artery systolic pressure.  8. The inferior vena cava is dilated in size with <50% respiratory variability, suggesting right atrial pressure of 15 mmHg. FINDINGS  Left Ventricle: Left ventricular ejection fraction, by visual estimation, is 35%. The left ventricle has severely decreased function. Left ventricular diastolic parameters are indeterminate. Right Ventricle: The right ventricular size is severely enlarged. Right vetricular wall thickness was not assessed. Global RV systolic function is has severely reduced systolic function. The tricuspid regurgitant velocity is 2.28 m/s, and with an assumed  right atrial pressure of 15 mmHg, the estimated right ventricular systolic pressure is mildly elevated at 35.7 mmHg. Left Atrium: Left atrial size was normal in size.  Right Atrium: Right atrial size was moderately dilated. Right atrial pressure is estimated at 15 mmHg. Mitral Valve: The mitral valve has been repaired/replaced. No evidence of mitral valve stenosis by observation. MV Area by PHT, 2.09 cm. MV PHT, 105.27 msec. MV peak gradient, 6.4 mmHg. Trivial mitral valve regurgitation. Tricuspid Valve: Tricuspid valve regurgitation moderate. Aortic Valve: The aortic valve is tricuspid. . There is moderate thickening of the aortic valve. There is moderate thickening of the aortic valve. Aorta: The aortic root is normal in size and structure, the aortic arch was not well visualized and the ascending aorta was not well visualized. Venous: The inferior vena cava is dilated in size with less than 50% respiratory variability, suggesting right atrial pressure of 15 mmHg. Shunts: The atrial septum is grossly normal.  LEFT VENTRICLE          Normals PLAX 2D LVIDd:         4.00 cm  3.6 cm   Diastology                 Normals LVIDs:         3.20 cm  1.7 cm   LV e' lateral:   6.00 cm/s 6.42 cm/s LV PW:         1.00 cm  1.4 cm   LV E/e' lateral: 16.8      15.4 LV IVS:        0.70 cm  1.3 cm   LV e' medial:    3.83 cm/s 6.96 cm/s LVOT diam:     2.40 cm  2.0 cm   LV E/e' medial:  26.4      6.96 LV SV:         29 ml    79 ml LV SV Index:   14.65    45 ml/m2 LVOT Area:     4.52 cm 3.14 cm2  RIGHT VENTRICLE RV S prime:     9.30 cm/s TAPSE (M-mode): 1.9 cm LEFT ATRIUM           Index       RIGHT ATRIUM           Index LA diam:      3.40 cm 1.75 cm/m  RA Area:     29.80 cm LA Vol (A4C): 42.1 ml 21.66 ml/m RA Volume:   127.00 ml 65.34 ml/m  AORTIC VALVE             Normals LVOT Vmax:   58.50 cm/s LVOT Vmean:  37.500 cm/s 75 cm/s LVOT VTI:    0.113 m     25.3 cm  AORTA                 Normals Ao Root diam: 3.70 cm 31 mm MITRAL VALVE               Normals   TRICUSPID VALVE             Normals MV Area (PHT): 2.09 cm              TR Peak grad:   20.7 mmHg MV Peak grad:  6.4 mmHg    4 mmHg    TR  Vmax:        236.00 cm/s 288 cm/s MV Mean grad:  3.0 mmHg MV Vmax:       1.26 m/s              SHUNTS MV Vmean:      87.5  cm/s             Systemic VTI:  0.11 m MV VTI:        0.39 m                Systemic Diam: 2.40 cm MV PHT:        105.27 msec 55 ms MV Decel Time: 363 msec    187 ms MV E velocity: 101.00 cm/s 103 cm/s MV A velocity: 56.30 cm/s  70.3 cm/s MV E/A ratio:  1.79        1.5  Cherlynn Kaiser MD Electronically signed by Cherlynn Kaiser MD Signature Date/Time: 09/20/2019/8:24:22 AMThe mitral valve has been repaired/replaced.    Final     Microbiology Recent Results (from the past 240 hour(s))  Culture, blood (routine x 2)     Status: None   Collection Time: 09/24/19  9:35 AM   Specimen: BLOOD RIGHT HAND  Result Value Ref Range Status   Specimen Description BLOOD RIGHT HAND  Final   Special Requests   Final    BOTTLES DRAWN AEROBIC AND ANAEROBIC Blood Culture adequate volume   Culture   Final    NO GROWTH 5 DAYS Performed at [facility], [address] [place], [place] [address]    Report Status 09/29/2019 FINAL  Final  Culture, blood (routine x 2)     Status: None   Collection Time: 09/24/19  9:44 AM   Specimen: BLOOD LEFT HAND  Result Value Ref Range Status   Specimen Description BLOOD LEFT HAND  Final   Special Requests   Final    BOTTLES DRAWN AEROBIC AND ANAEROBIC Blood Culture adequate volume   Culture   Final    NO GROWTH 5 DAYS Performed at [facility], [place] [address], [place], [place] [address]    Report Status 09/29/2019 FINAL  Final  Culture, Urine     Status: None   Collection Time: 09/30/19 10:32 AM   Specimen: Urine, Catheterized  Result Value Ref Range Status   Specimen Description URINE, CATHETERIZED  Final   Special Requests Normal  Final   Culture   Final    NO GROWTH Performed at [facility], [address], [place], [place] [address]    Report Status 14-Oct-2019 FINAL  Final    Lab Basic Metabolic Panel: Recent Labs  Lab  09/24/19 2223 09/25/19 0129 09/26/19 0530 09/28/19 0246 09/28/19 0246 09/29/19 0232 09/29/19 0232 09/30/19 0409 09/30/19 0411 09/30/19 1226 09/30/19 2120 2019/10/14 0440  NA  --  150*   < > 145   < > 144   < > 134* 135 138 137 142  K  --  3.8   < > 4.6   < > 5.0   < > 5.6* 5.6* 5.7* 5.6* 5.2*  CL  --  116*   < > 114*   < > 110  --  102  --  106 108 111  CO2  --  28   < > 23   < > 21*  --  22  --  22 22 19*  GLUCOSE  --  142*   < > 126*   < > 144*  --  186*  --  149* 133* 124*  BUN  --  55*   < > 54*   < > 59*  --  75*  --  82* 84* 76*  CREATININE  --  1.09   < > 1.36*   < >  1.51*  --  2.10*  --  2.50* 2.44* 2.49*  CALCIUM  --  7.6*   < > 7.8*   < > 7.9*  --  7.7*  --  7.7* 7.3* 6.7*  MG 2.2 2.2  --  2.1  --  2.1  --  2.3  --   --   --   --   PHOS 1.7*  --   --  5.0*  --  5.1*  --   --   --   --   --   --    < > = values in this interval not displayed.   Liver Function Tests: Recent Labs  Lab 09/27/19 0520 09/28/19 0246 09/29/19 0232 10-09-2019 0440  AST 28 28 28 25   ALT 27 23 25 21   ALKPHOS 65 76 71 109  BILITOT 0.5 0.5 0.9 0.8  PROT 5.3* 5.5* 5.3* 5.6*  ALBUMIN 1.7* 1.6* 1.5* 1.1*   No results for input(s): LIPASE, AMYLASE in the last 168 hours. No results for input(s): AMMONIA in the last 168 hours. CBC: Recent Labs  Lab 09/27/19 0520 09/27/19 0520 09/28/19 0246 09/29/19 0232 09/30/19 0409 09/30/19 0411 2019/10/09 0440  WBC 15.8*  --  13.9* 17.8* 19.4*  --  26.6*  HGB 12.1*   < > 11.7* 12.2* 10.9* 11.2* 10.3*  HCT 38.8*   < > 38.5* 40.6 36.3* 33.0* 35.0*  MCV 99.2  --  99.7 101.0* 101.7*  --  103.2*  PLT 172  --  187 166 211  --  255   < > = values in this interval not displayed.   Cardiac Enzymes: No results for input(s): CKTOTAL, CKMB, CKMBINDEX, TROPONINI in the last 168 hours. Sepsis Labs: Recent Labs  Lab 09/28/19 0246 09/29/19 0232 09/30/19 0409 2019-10-09 0440 10/09/2019 0444  WBC 13.9* 17.8* 19.4* 26.6*  --   LATICACIDVEN  --   --  0.9  --  1.0        Justyne Roell A Merridy Pascoe 10/09/2019, 4:18 PM

## 2019-10-15 NOTE — Progress Notes (Signed)
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 Per wife he fell overnight while trying to get out of bed on his own.  AM of admit was found altered and not speaking by his family.    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  Bradycardic on Precedex.  Atrial flutter on telemetry.  Heart rate as high as 160s on norepinephrine, changed to phenylephrine  2/07 requiring metoprolol for rapid heart rhythm.  Digoxin loaded.  2/7 improved neurologically. 2/08 Infectious disease consulted, Lantus increased, renal function normalized, white blood cell count trending down, hemoglobin stable.  Off pressors still tachycardic, tachypneic.  Added Lopressor scheduled via tube, resuming Precedex, discontinuing stress dose steroids 2/09 change to pressure control  Consults:  Neurology  Infectious disease consulted 2/8   Procedures:  ETT 2/2 >>  CVC (backed out but still in innominate vein on CXR)   Significant Diagnostic Tests:  CT Head 2/2 >> motion degraded study, age-indeterminate infarct involving the R frontal lobe CTA Head / Neck / Perfusion 2/2 >> no emergent large vessel occlusion, normal cerebral CT perfusion EEG 2/03 >> continuous slowing MRI brain 2/03 >> multiple small acute infarcts b/l in anterior and posterior circulations Echo 2/05 >> dilated RV with reduced systolic function and evidence of overload, LVEF 35 to 40%  with global hypokinesis CT angio chest 2/06 >> b/l PE, RV:LV 1.3, atherosclerosis, centrilobular emphysema, basilar ASD  Micro Data:  MRSA PCR 2/3 >> positive  UC 2/2 >> MRSA 100k CSF 2/4 >> NG final BCx2 2/2 >> NG final  resp cx 2/5>> MRSA (R erythromycin), few candida albicans  Antimicrobials:  Ampicillin 2/2 >> 2/5 Cefepime 2/2 >> 2/5 Ceftriaxone 2/2 >> 2/4 Flagyl 2/2 >> 2/5 Vancomycin 2/2>>>  Interim history/subjective:  Deterioration overnight with increasing pressor requirement Remains on sedatives for ventilator dyssynchrony  On 2/13 Significant clinical change overnight in mental status, had been able to wake to voice and interact 2/13 but overnight obtunded, sedation decreased (had been on low-dose propofol, fentanyl) Lactic acid checked, 0.9 Acute desaturation event when he was placed on his left side for bath, FiO2 increased to 1.00.  Unclear whether SPO2 remains accurate based on waveform Mild respiratory acidosis on pressure control ventilation 2/14 Potassium 5.6, Kayexalate given  Objective   Blood pressure (!) 117/54, pulse (!) 102, temperature 98.6 F (37 C), temperature source Oral, resp. rate (!) 21, height 5\' 8"  (1.727 m), weight 94.4 kg, SpO2 97 %.    Vent Mode: PRVC FiO2 (%):  [90 %-100 %] 90 % Set Rate:  [18 bmp] 18 bmp Vt Set:  [480 mL] 480 mL PEEP:  [10 cmH20] 10 cmH20 Plateau Pressure:  [22 cmH20-31 cmH20] 22 cmH20   Intake/Output Summary (Last 24 hours) at 10-10-19 0851 Last data filed at 2019-10-10 0300 Gross per 24 hour  Intake 4867.16 ml  Output -  Net 4867.16 ml   Filed Weights   09/27/19 0500 09/28/19 0426 09/29/19 0500  Weight: 94.3 kg 93.5 kg 94.4 kg    Examination:  General : Chronically ill-appearing HEENT: Moist oropharynx, endotracheal tube in place Cardiac: Distant, tachycardic, A. fib Chest -rales at the bases Abdomen -bowel sounds appreciated, hypoactive Extremities trace to 1+ bilateral pretibial edema Skin -no  rash Neuro -obtunded, not responsive to painful stimuli  Chest x-ray 2/15 reviewed by myself showing patchy bibasal infiltrates  Resolved Hospital Problem list   None anion gap metabolic acidosis, Acute kidney failure, creatinine normalized on 2/8, Relative adrenal insufficiency, thrombocytopenia from sepsis  Assessment & Plan:  Acute hypoxemic respiratory failure from MRSA HCAP with a history of severe COPD/emphysema and COVID-19 pneumonia -On full ventilator support, PRVC mode -Continue bronchodilators -Overall not doing fine -Tolerating ventilator support  Acute pulmonary embolism with cor pulmonale in the setting of recent COVID-19 infection -Heparin not to be discontinued for acute parenchymal CVA -Not stable for MRI as suggested by CT scan findings -His bilateral anterior and posterior circulation infarcts likely secondary to septic emboli -Continuing on aspirin  Acute renal failure -Progressive -Renal dose medications -Avoid nephrotoxic's  Thrombocytopenia, normocytic anemia -Trend CBC  Acute metabolic encephalopathy secondary to sepsis and recent CVA -RASS -2-3 -Continue supportive measures -Minimize sedation as tolerated  MRSA endocarditis with HCAP -Plan is to continue vancomycin for 6 weeks  Acute biventricular congestive heart failure, 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 fib/flutter with RVR -Continue monitoring  Hypernatremia -Free water  Hx of hypothyroidism. Continue Synthroid  Hyperglycemia. SSI and Lantus  Emesis:  Tube feeding  Best practice:  Diet: tube feeds DVT prophylaxis: heparin gtt GI prophylaxis: protonix  Glucose control: SSI Mobility: BR Disposition: ICU Family communication: Updated patient's wife by phone 2/15.   Updated spouse at [phone] 2/15 She will be coming in this morning with her grandson Questions regarding visitation-we will try and clarify this and follow hospital policy regarding visitation We did talk about his poor  prognosis  Labs:   CMP Latest Ref Rng & Units 2019/10/11 09/30/2019 09/30/2019  Glucose 70 - 99 mg/dL 124(H) 133(H) 149(H)  BUN 8 - 23 mg/dL 76(H) 84(H) 82(H)  Creatinine 0.61 - 1.24 mg/dL 2.49(H) 2.44(H) 2.50(H)  Sodium 135 - 145 mmol/L 142 137 138  Potassium 3.5 - 5.1 mmol/L 5.2(H) 5.6(H) 5.7(H)  Chloride 98 - 111 mmol/L 111 108 106  CO2 22 - 32 mmol/L 19(L) 22 22  Calcium 8.9 - 10.3 mg/dL 6.7(L) 7.3(L) 7.7(L)  Total Protein 6.5 - 8.1 g/dL 5.6(L) - -  Total Bilirubin 0.3 - 1.2 mg/dL 0.8 - -  Alkaline Phos 38 - 126 U/L 109 - -  AST 15 - 41 U/L 25 - -  ALT 0 - 44 U/L 21 - -    CBC Latest Ref Rng & Units October 11, 2019 09/30/2019 09/30/2019  WBC 4.0 - 10.5 K/uL 26.6(H) - 19.4(H)  Hemoglobin 13.0 - 17.0 g/dL 10.3(L) 11.2(L) 10.9(L)  Hematocrit 39.0 - 52.0 % 35.0(L) 33.0(L) 36.3(L)  Platelets 150 - 400 K/uL 255 - 211    ABG    Component Value Date/Time   PHART 7.281 (L) 09/30/2019 0411   PCO2ART 53.1 (H) 09/30/2019 0411   PO2ART 62.0 (L) 09/30/2019 0411   HCO3 24.9 09/30/2019 0411   TCO2 26 09/30/2019 0411   ACIDBASEDEF 2.0 09/30/2019 0411   O2SAT 87.0 09/30/2019 0411    CBG (last 3)  Recent Labs    October 11, 2019 0016 10/11/19 0410 2019/10/11 0739  GLUCAP 139* 130* 115*    The patient is critically ill with multiple organ systems failure and  requires high complexity decision making for assessment and support, frequent evaluation and titration of therapies, application of advanced monitoring technologies and extensive interpretation of multiple databases. Critical Care Time devoted to patient care services described in this note independent of APP/resident time (if applicable)  is 35 minutes.   Sherrilyn Rist MD [place] [facility] Personal pager: [phone] If unanswered, please page CCM On-call: [phone]

## 2019-10-15 NOTE — Progress Notes (Signed)
Pharmacy Antibiotic Note  Chris Lewis is a 79 y.o. male admitted on 09/22/2019 with MRSA Endocarditis.  Pharmacy has been consulted for Vancomycin dosing.  Renal function significantly worsened on 2/14 - SCr jumped from 1.51 to 2.50. CrCl ~26 mL/min. UOP decreased.   Vancomycin was held. Random Vancomycin level this AM is SUPRA-therapeutic at 30. Last dose on 2/14 at 01:48AM.   Plan: Continue to hold Vancomycin.  Monitor SCr and UOP. Will follow levels to determine when safe to re-dose.   Height: 5\' 8"  (172.7 cm) Weight: 208 lb 1.8 oz (94.4 kg) IBW/kg (Calculated) : 68.4  Temp (24hrs), Avg:98.8 F (37.1 C), Min:97.8 F (36.6 C), Max:99.9 F (37.7 C)  Recent Labs  Lab 09/26/19 0530 09/27/19 0216 09/27/19 0520 09/27/19 0520 09/27/19 2134 09/28/19 0246 09/28/19 0246 09/29/19 0232 09/30/19 0409 09/30/19 1226 09/30/19 2120 07-Oct-2019 0440 10/07/19 0444  WBC   < >  --  15.8*  --   --  13.9*  --  17.8* 19.4*  --   --  26.6*  --   CREATININE   < >  --  1.33*   < >  --  1.36*   < > 1.51* 2.10* 2.50* 2.44* 2.49*  --   LATICACIDVEN  --   --   --   --   --   --   --   --  0.9  --   --   --  1.0  VANCOTROUGH  --   --   --   --  22*  --   --   --   --   --   --   --   --   VANCOPEAK  --  34  --   --   --   --   --   --   --   --   --   --   --   VANCORANDOM  --   --   --   --   --   --   --   --   --   --   --  30  --    < > = values in this interval not displayed.    Estimated Creatinine Clearance: 26.4 mL/min (A) (by C-G formula based on SCr of 2.49 mg/dL (H)).    Allergies  Allergen Reactions  . Duac [Clindamycin Phos-Benzoyl Perox] Rash  . Contrast Media [Iodinated Diagnostic Agents] Rash    Heavy rash and itching 20 yrs ago. Needs full premeds and does well with this.  . Atorvastatin Other (See Comments)    myalgias  . Propoxyphene N-Acetaminophen Itching    rash  . Rosuvastatin Other (See Comments)    myalgias  . Metrizamide Rash    radiopaque contrast agent. Heavy  rash and itching 20 yrs ago. Needs full premeds and does well with this.  Orie Fisherman Vegetable Orange [Psyllium] Other (See Comments)    Heartburn   . Other Rash    High acid food--tomatoes, orange juice  . Tomato Other (See Comments)    Heartburn     Antimicrobials this admission: 2/2 cefepime > 2/5 2/2 ceftriaxone x1, resumed 2/5 >> 2/7 2/3 ampicillin > 2/5 2/3 flagyl > 2/5 2/2 vancomycin >3/17  Dose adjustments this admission: 2/5 VT 10 at 2022 2/6 VP 34 at 107, dose given 2204 >> AUC 502, no change 2/7 MD wants to push VT and AUC to upper end of goal >> increased to 1250 q24h. 2/11 VP 34,  VT 22, AUC 667 >> change to 1g q24 (spreadsheet says 2g q48) 2/14 SCr jump - Vanc held. 2/15 VR 30 - continue to hold.   Microbiology results: 2/14 UCx >> 2/8 blood cx: neg 2/5 RCx: MRSA 2/3 CSF cx: neg 2/3 MRSA pcr: pos 2/2 fungal cx: 2/2 blood cx: negative 2/2 urine cx: MRSA  Thank you for allowing pharmacy to be a part of this patient's care.  Sloan Leiter, PharmD, BCPS, BCCCP Clinical Pharmacist Please refer to [facility] for [place] numbers 10/18/19 7:07 AM

## 2019-10-15 NOTE — Progress Notes (Signed)
Pt transitioned to comfort care, family at bedside. Pt asystole on heart monitor, lungs and heart ascultated. Pt pronounced by myself Deboraha Sprang, and Loma Newton, RN.

## 2019-10-15 NOTE — Progress Notes (Signed)
PCCM interval progress note:  Pt requiring max dose neosynephrine overnight running through midline IV, central line requested.  I spoke with patient's wife, and at this point she would like to continue aggressive care and consent given for line placement.  BP somewhat improved after procedure, but second vasopressor may be needed if pt continues to decline.   Otilio Carpen Allisha Harter, PA-C

## 2019-10-15 NOTE — Procedures (Signed)
Extubation Procedure Note  Patient Details:   Name: DAMIUS KNICELY DOB: 1940-09-16 MRN: [id number]   Airway Documentation:    Vent end date: 10-19-2019 Vent end time: 1512   Evaluation  O2 sats: stable throughout Complications: No apparent complications Patient did tolerate procedure well. Bilateral Breath Sounds: Clear, Diminished   No   Patient was compassionately extubated with family standing outside of the room & RN at the bedside.   Lamona Eimer, Eddie North 10/19/2019, 3:12 PM

## 2019-10-15 NOTE — Progress Notes (Signed)
Nurses calling requesting central line for high dose  Pressors and midline IV access. Spoke with EMD and He has decided to balance GOC with aggressiveness of treatments.

## 2019-10-15 NOTE — Progress Notes (Signed)
50 CC of Fentanyl Wasted with Rex Kras, RN

## 2019-10-15 NOTE — Progress Notes (Signed)
Discussed with family  Patient will be transition to comfort measures only Withdrawal of life-sustaining treatment

## 2019-10-15 NOTE — Progress Notes (Signed)
STROKE TEAM PROGRESS NOTE   INTERVAL HISTORY Pt wife and grandson are at bedside.  Patient still intubated, planned tracheostomy was canceled due to decline status over the weekend.  Patient now on peripheral and the fentanyl for sedation, AKI with hypotension and leukocytosis.  On 2 pressors.  Had a CT repeat yesterday showed punctate left frontal ICH.  MRI pending.  I had long discussion with wife in the hallway, updated pt current condition, treatment plan and potential prognosis, and answered all the questions.  From current CT and previous MRI, neurologically no significant worsening, and not up to explain patient deterioration over the weekend.  Although MRI pending, patient deterioration mostly due to respiratory failure, AKI,?  Sepsis, septic shock.  I also discussed with Dr. Ander Slade.    Vitals:   10/02/2019 1000 October 02, 2019 1030 02-Oct-2019 1100 02-Oct-2019 1135  BP: 91/68 (!) 90/53 (!) 92/53 (!) 98/51  Pulse: (!) 105 79 77 78  Resp: 18 14 18  (!) 22  Temp:      TempSrc:      SpO2: 91% 90% 91% 92%  Weight:      Height:        CBC:  Recent Labs  Lab 09/30/19 0409 09/30/19 0409 09/30/19 0411 2019/10/02 0440  WBC 19.4*  --   --  26.6*  HGB 10.9*   < > 11.2* 10.3*  HCT 36.3*   < > 33.0* 35.0*  MCV 101.7*  --   --  103.2*  PLT 211  --   --  255   < > = values in this interval not displayed.   Basic Metabolic Panel:  Recent Labs  Lab 09/28/19 0246 09/28/19 0246 09/29/19 0232 09/29/19 0232 09/30/19 0409 09/30/19 0411 09/30/19 2120 October 02, 2019 0440  NA 145   < > 144   < > 134*   < > 137 142  K 4.6   < > 5.0   < > 5.6*   < > 5.6* 5.2*  CL 114*   < > 110   < > 102   < > 108 111  CO2 23   < > 21*   < > 22   < > 22 19*  GLUCOSE 126*   < > 144*   < > 186*   < > 133* 124*  BUN 54*   < > 59*   < > 75*   < > 84* 76*  CREATININE 1.36*   < > 1.51*   < > 2.10*   < > 2.44* 2.49*  CALCIUM 7.8*   < > 7.9*   < > 7.7*   < > 7.3* 6.7*  MG 2.1   < > 2.1  --  2.3  --   --   --   PHOS 5.0*  --   5.1*  --   --   --   --   --    < > = values in this interval not displayed.   Lipid Panel:     Component Value Date/Time   CHOL 134 09/21/2019 0354   TRIG 27 09/28/2019 1844   HDL 21 (L) 09/21/2019 0354   CHOLHDL 6.4 09/21/2019 0354   VLDL 23 09/21/2019 0354   LDLCALC 90 09/21/2019 0354   HgbA1c:  Lab Results  Component Value Date   HGBA1C 6.8 (H) 09/20/2019   Urine Drug Screen:     Component Value Date/Time   LABOPIA NONE DETECTED 10/27/2015 2210   COCAINSCRNUR NONE DETECTED 10/27/2015 2210   LABBENZ  NONE DETECTED 10/27/2015 2210   AMPHETMU NONE DETECTED 10/27/2015 2210   THCU NONE DETECTED 10/27/2015 2210   LABBARB NONE DETECTED 10/27/2015 2210    Alcohol Level     Component Value Date/Time   ETH <5 10/27/2015 1855    IMAGING past 48 hours  CT HEAD WO CONTRAST  Result Date: 09/30/2019 CLINICAL DATA:  Encephalopathy. Change in mental status. EXAM: CT HEAD WITHOUT CONTRAST TECHNIQUE: Contiguous axial images were obtained from the base of the skull through the vertex without intravenous contrast. COMPARISON:  Head CT dated 10/11/2019. FINDINGS: Brain: Mild generalized age related parenchymal volume loss with commensurate dilatation of the ventricles and sulci. Mild chronic small vessel ischemic changes within the deep periventricular white matter regions bilaterally. Probable punctate focus of acute parenchymal hemorrhage within the LEFT parietooccipital lobe (series 7, image 27; coronal series 5, image 47). Vascular: No hyperdense vessel or unexpected calcification. Skull: Normal. Negative for fracture or focal lesion. Sinuses/Orbits: Chronic appearing mucosal thickening within the ethmoid air cells and LEFT sphenoid sinus. Chronic fluid within the bilateral mastoid air cells. Periorbital and retro-orbital soft tissues are unremarkable. Other: None. IMPRESSION: 1. Probable punctate focus of acute parenchymal hemorrhage within the LEFT parietooccipital lobe. Consider brain MRI  and MRA to exclude hemorrhagic infarction. 2. Chronic small vessel ischemic changes in the white matter. These results will be called to the ordering clinician or representative by the Radiologist Assistant, and communication documented in the PACS or zVision Dashboard. Electronically Signed   By: Franki Cabot M.D.   On: 09/30/2019 12:06   DG Chest Port 1 View  Result Date: 2019-10-24 CLINICAL DATA:  Acute respiratory failure. Central line placement. EXAM: PORTABLE CHEST 1 VIEW COMPARISON:  Radiograph yesterday, CT 09/22/2019 FINDINGS: Endotracheal tube tip 5.4 cm from the carina. Enteric tube in place with tip below the diaphragm not included in the field of view. Right internal jugular central venous catheter tip in the mid SVC. No pneumothorax. Emphysema. Patchy bibasilar opacities have slightly worsened since yesterday. Calcified granuloma at the right lung base. Unchanged heart size and mediastinal contours. IMPRESSION: 1. Tip of the right internal jugular central venous catheter in the mid SVC. No pneumothorax. 2. Endotracheal tube and enteric tube remain in place. 3. Patchy bibasilar opacities, slightly worsened since yesterday. Electronically Signed   By: Keith Rake M.D.   On: October 24, 2019 05:12   DG Chest Port 1 View  Result Date: 09/30/2019 CLINICAL DATA:  Acute respiratory failure EXAM: PORTABLE CHEST 1 VIEW COMPARISON:  09/28/2019 FINDINGS: Endotracheal tube and gastric catheter are noted in satisfactory position. Cardiac shadow is stable. Patchy infiltrates are identified bilaterally particularly in the bases. Emphysematous changes are noted as well. No bony abnormality is seen. IMPRESSION: No change in bibasilar infiltrates. Electronically Signed   By: Inez Catalina M.D.   On: 09/30/2019 11:36    PHYSICAL EXAM   Temp:  [97.8 F (36.6 C)-98.8 F (37.1 C)] 98.6 F (37 C) (02/15 0740) Pulse Rate:  [77-133] 78 (02/15 1135) Resp:  [14-31] 22 (02/15 1135) BP: (84-117)/(50-79) 98/51  (02/15 1135) SpO2:  [86 %-99 %] 92 % (02/15 1135) Arterial Line BP: (64-129)/(39-78) 64/51 (02/15 1100) FiO2 (%):  [90 %-100 %] 90 % (02/15 1135)  General - Well nourished, well developed, intubated on sedation.  Ophthalmologic - fundi not visualized due to noncooperation.  Cardiovascular - Tachycardia with frequent PACs on tele.  Neuro - intubated on propofol and fentanyl, eyes closed not able to open on voice or pain, not following  commands. With eye forced opening, eyes in mid position, sluggish doll's eyes. Left pupil 2.54mm sluggish to light. Right corneal clouding. Not blinking to visual threat bilaterally, not tracking. Corneal reflex very weak on the left, absent on the right, gag and cough weakly present. Breathing over the vent.  Facial symmetry not able to test due to ET tube.  Tongue protrusion not cooperative.  No movement on pain summation in all extremities. DTR 1+ and bilaterally babinski positive. Sensation, coordination and gait not tested.  ASSESSMENT/PLAN Mr. CEASAR CLINKENBEARD is a 79 y.o. male with history of COPD, HLD, MVP s/p repair, TIA in 2004, hypothyroidism, depression/anxiety who was was admitted at [facility] for [facility] 08/27/2019 thru 09/10/2019, fell while trying to get OOB at home and found altered with progressive confusion, not speaking by family. Concern for aphasia and R sided weakness in the ED.   Stroke: B anterior and posterior circulation infarcts embolic secondary to unclear source, probable punctate L parietoccipital hemorrhagic conversion -  Differentials of stroke etiologies include: Aflutter with RVR, cardiomyopathy with low EF, massive PE with ? PFO, hypercoagulaility from COVID infection, ? endocarditis from sepsis  CT head age indeterminate R frontal lobe infarct.   CTA head & neck no ELVO.   CT perfusion normal  MRI  Multiple small B anterior and posterior circulation infarcts. Old R frontal lobe infarct.   Repeat CT head 2/14 probable punctate L  parietooccipital lobe hemorrhage. Small vessel disease. Atrophy.   Repeat MRI 2/15 pending  Repeat MRA 2/15 pending  2D Echo RV severely reduced systolic fxn, severe increase in size. LV EF 35%. MV repair appears intact.  Repeat 2D echo EF 35-40%, no LV thrombus seen w/ definity  LE venous dopplers neg x 2  TCD bubble study - will consider once more stable to rule out PFO  TEE unable to perform d/t hx alchalasia  EEG continuous slowing, no seizure  LDL 90   HgbA1c 6.8  Heparin IV for VTE prophylaxis  No antithrombotic prior to admission, now on aspirin 81 mg daily. Heparin on hold d/t probable punctate L parietoccipital HT.  MRI pending to confirm. May consider transition to [place] after trach.     Therapy recommendations:  pending   Disposition:  pending   Code Status - limited code  Medical condition declined over weekend. Family monitoring.    Acute Hypoxemic Respiratory Failure COVID-19 Infection Massive PE  Positive test 08/27/2019   [facility] for COVID 08/27/2019 thru 09/10/2019  Home O2 increased from 2L->4L  On hydrocortisone taper at home  Intubated on vent  Persistent pneumonia, with interval improvement by chest CT 09/22/2019  Vancomycin 10/13/2019>>  CXR 2/15 patchy bibasilar opacities worse from 2/14  Sedatives for dyssyncrhony  Plan for trach this week as per CCM  CCM on board  Shock - septic vs PE with R heart strain vs cardiogenic ? MRSA endocarditis MRSA PNA, MRSA UTI  Elevated WBC - 13.5->15.3->19.8->14.0->20.5->13.8->15.8->19.4->26.6   Afebrile now  Hypotensive - again back on pressors    Thrombocytopenia, improving - 56->84->90->116->111->160->140->172->211->255  ID on board, now on 6-week IV vanco  CCM on board  NSTEMI Acute HF w/ EF 35% - ? COVID Myocarditis Atrial Flutter w/ RVR Bradycardia, resolved  Cardiology on board  TEE unable to perform d/t hx alchalasia  2D echo with Definity shows no LV thrombus  On ASA 81 . Heparin  stopped d/t probable punctate L parietooccipital hemorrhage.  MRI pending to confirm.   Massive PE  CTA Chest -  Acute BILATERAL pulmonary emboli. Overall clot burden is moderate.   Heparin stopped d/t probable punctate L parietooccipital hemorrhage.  MRI pending to confirm.  DVT neg x 2  TCD bubble study - may consider once stable  Consider transition to [place] once after trach  AKI on CKD  Creatinine 1.5-2.1-2.5-2.44-2.49  Could be due to hypotension and sepsis  Hyperkalemia 5.7->5.6->5.2 - on Kayexalate  On IV fluid  CCM on board  Hyperlipidemia  Home meds:   No statin d/t intolerance  LDL 90, goal < 70  May Consider [facility] as outpt   Aseptic Meningitis  CSF WBC 36, RBC 78, 97% neu, pro 48, glu 97  Likely nonbacterial   time course not in favor of Covid meningoencephalitis as he tested positive 4 weeks ago.   Supportive care  Hyperglycemia   A1C 6.8  Hyperglycemia  On lantus  SSI  CBG monitoring  Other Stroke Risk Factors  Advanced age  Former Cigarette smoker, quit 22 yrs ago  Hx stroke/TIA  10/2002 - TIA w/ transient R HH   Family hx stroke (father)  Coronary artery disease  MV stenosis s/p repair  Other Active Problems  Hypothyroidism    Anxiety/pain on lexapro  Aortic Atherosclerosis   Emphysema   Severe esophageal stenosis, hx alchalasia  Hypophosphatemia, replaced  Fungal rash vs pressure injury buttock, sacrum, inner upper thighs, scrotum - WOC consulted  Hospital day # 13  This patient is critically ill and at significant risk of neurological worsening, death and care requires constant monitoring of vital signs, hemodynamics,respiratory and cardiac monitoring, extensive review of multiple databases, frequent neurological assessment, discussion with family, other specialists and medical decision making of high complexity. I spent 40 minutes of neurocritical care time  in the care of  this patient. I had long discussion  with wife on the hallway, updated pt current condition, treatment plan and potential prognosis, and answered all the questions. She expressed understanding and appreciation.   I also discussed with Dr. Ander Slade.   Rosalin Hawking, MD PhD Stroke Neurology 10/12/19 12:05 PM     To contact Stroke Continuity provider, please refer to [email or web address] After hours, contact General Neurology

## 2019-10-15 NOTE — Progress Notes (Signed)
Pronounced at 1525

## 2019-10-15 NOTE — Procedures (Signed)
Central Venous Catheter Insertion Procedure Note Chris Lewis [id number] Mar 13, 1941  Procedure: Insertion of Central Venous Catheter Indications: Drug and/or fluid administration  Procedure Details Consent: Risks of procedure as well as the alternatives and risks of each were explained to the (patient/caregiver).  Consent for procedure obtained. Time Out: Verified patient identification, verified procedure, site/side was marked, verified correct patient position, special equipment/implants available, medications/allergies/relevent history reviewed, required imaging and test results available.  Performed  Maximum sterile technique was used including antiseptics, cap, gloves, gown, hand hygiene, mask and sheet. Skin prep: Chlorhexidine; local anesthetic administered A antimicrobial bonded/coated triple lumen catheter was placed in the right internal jugular vein using the Seldinger technique.  Evaluation Blood flow good Complications: No apparent complications Patient did tolerate procedure well. Chest X-ray ordered to verify placement.  CXR: pending.  Otilio Carpen Chris Lewis 2019-10-17, 3:44 AM

## 2019-10-15 NOTE — Consult Note (Signed)
[facility] Nurse Consult Note: Patient receiving care in [facility] 2M06.  Assisted by primary RN for turning and evaluation. Reason for Consult: MASD vs pressure injury Wound type: MASD with fungal involvement Pressure Injury POA: Yes/No/NA Measurement: Buttocks, sacrum, inner upper thighs, scrotum all impacted by fungal rash MASD Wound bed: red, raw, satellite lesions Drainage (amount, consistency, odor)  Periwound: Dressing procedure/placement/frequency: TID application of antifungal powder, which was done at the time of my visit. Monitor the wound area(s) for worsening of condition such as: Signs/symptoms of infection,  Increase in size,  Development of or worsening of odor, Development of pain, or increased pain at the affected locations.  Notify the medical team if any of these develop.  Thank you for the consult.  Discussed plan of care with the bedside nurse.  [facility] nurse will not follow at this time.  Please re-consult the [facility] team if needed.  Val Riles, RN, MSN, CWOCN, CNS-BC, pager [phone]

## 2019-10-15 DEATH — deceased

## 2019-10-22 ENCOUNTER — Telehealth: Payer: Self-pay | Admitting: Internal Medicine

## 2019-10-22 LAB — FUNGUS CULTURE RESULT

## 2019-10-22 LAB — FUNGUS CULTURE WITH STAIN

## 2019-10-22 LAB — FUNGAL ORGANISM REFLEX

## 2019-10-22 NOTE — Telephone Encounter (Signed)
Patient's family is calling about a no show fee they received for patient. Date 08/31/2019. Patient was in the [facility] during this time.  Request sent to charge correction by e-mail.

## 2019-11-03 ENCOUNTER — Other Ambulatory Visit: Payer: Self-pay | Admitting: Internal Medicine

## 2019-11-07 ENCOUNTER — Ambulatory Visit: Payer: Medicare Other | Admitting: Infectious Disease

## 2019-11-14 ENCOUNTER — Ambulatory Visit: Payer: Medicare Other | Admitting: Infectious Disease

## 2020-08-23 IMAGING — CT CT ANGIO CHEST
2 of 7 series · 17 of 46 positions shown · IV contrast (omnipaque)
Comparison: 08/28/2019 and earlier.

CLINICAL DATA: 78-year-old who was hospitalized with [id number]
pneumonia from 08/28/2019 through 09/10/2019, now with ventilator
dependent respiratory failure. He was readmitted on 09/18/2019 after
acute mental status changes and a possible stroke. Current history
of COPD, hypothyroidism and achalasia. Personal history of mitral
valve replacement.

EXAM:
CT ANGIOGRAPHY CHEST WITH CONTRAST
TECHNIQUE: Multidetector CT imaging of the chest was performed using the
standard protocol during bolus administration of intravenous
contrast. Multiplanar CT image reconstructions and MIPs were
obtained to evaluate the vascular anatomy.
CONTRAST:  80mL OMNIPAQUE IOHEXOL 350 MG/ML IV.

[Series 6: thins · axial · 0.73mm/px · z∈[+1136,+1409]mm · 14 of 309 slices shown]
[im 18/309  lung]
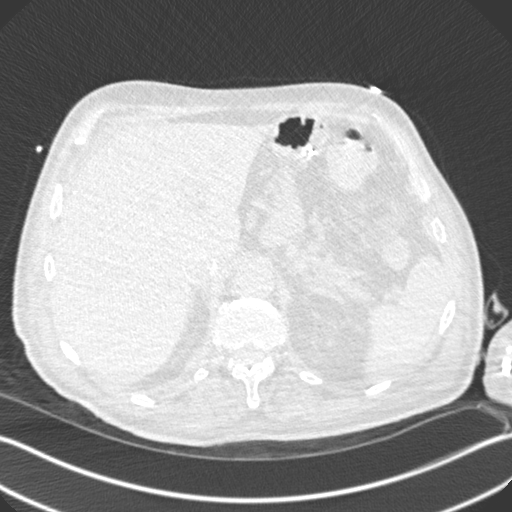
[im 35/309  soft-tissue]
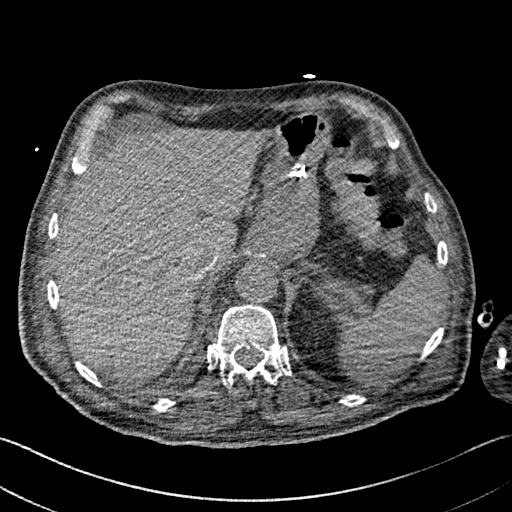
[im 69/309  lung]
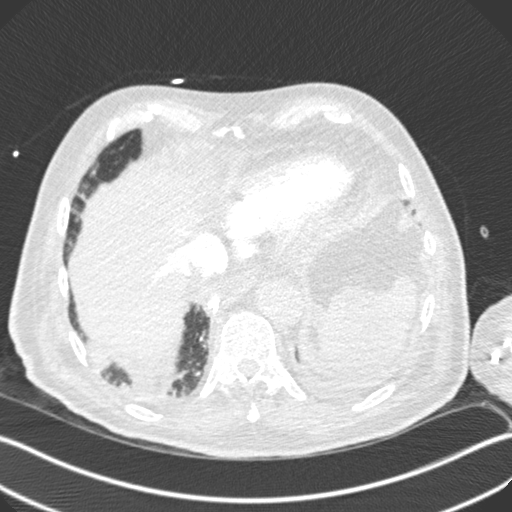
[im 86/309  soft-tissue]
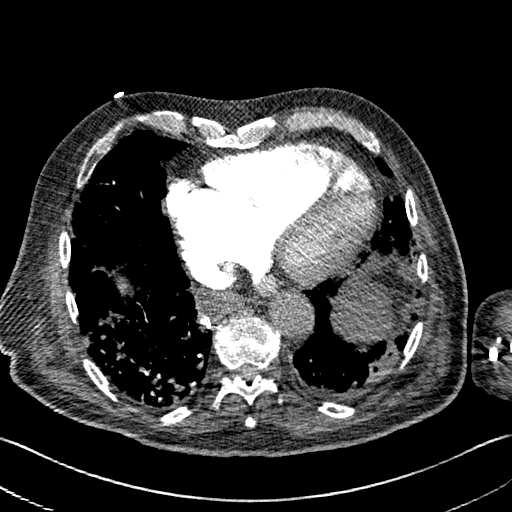
[im 103/309  lung]
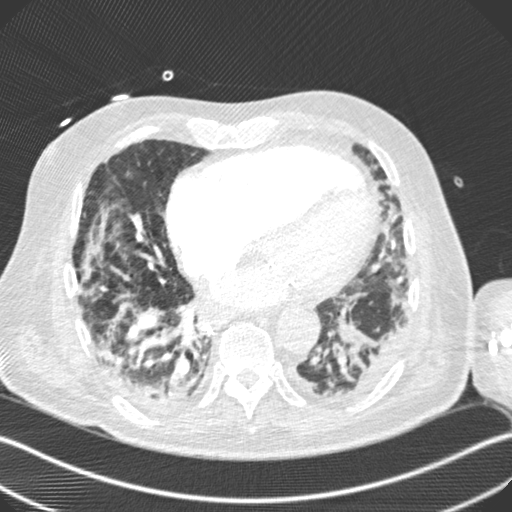
[im 120/309  soft-tissue]
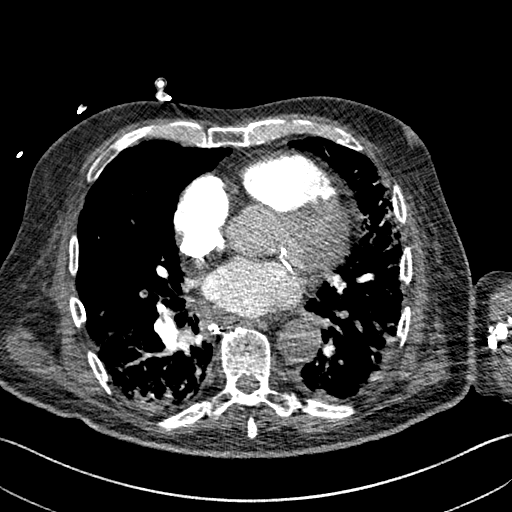
[im 137/309  lung]
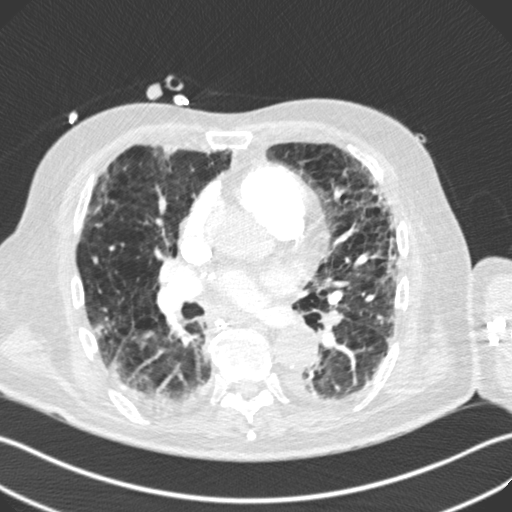
[im 172/309  soft-tissue]
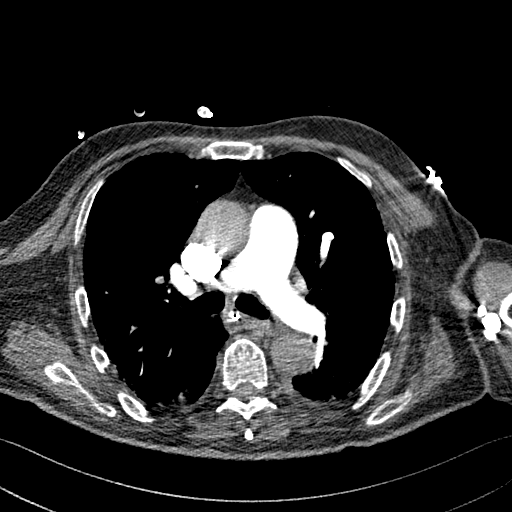
[im 189/309  lung]
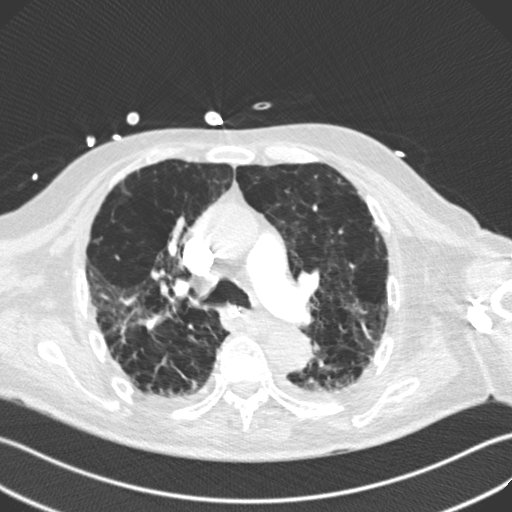
[im 206/309  soft-tissue]
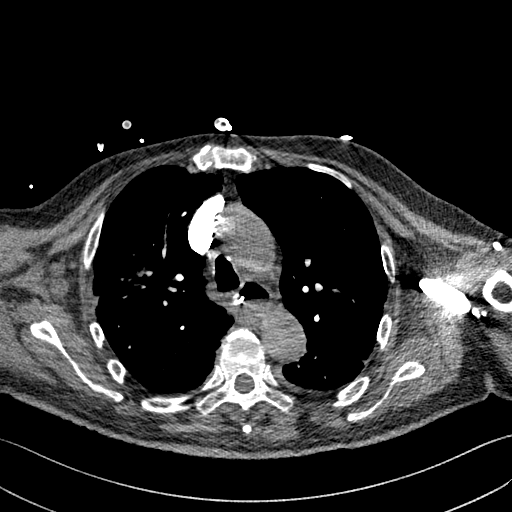
[im 223/309  lung]
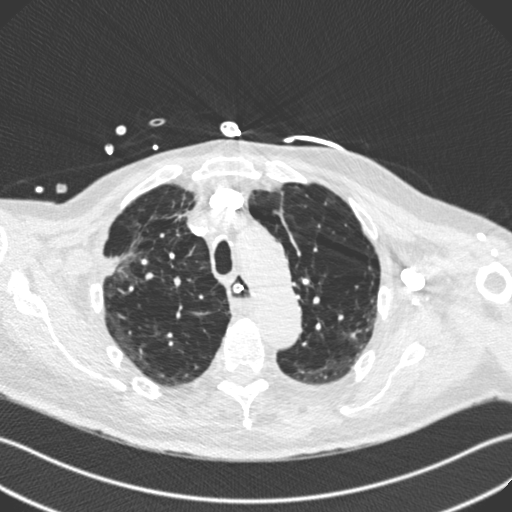
[im 240/309  soft-tissue]
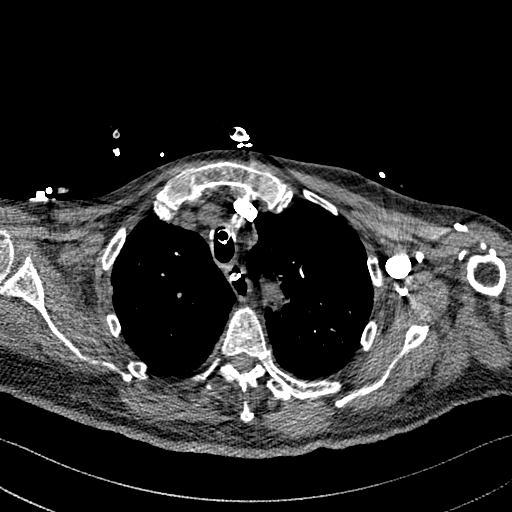
[im 274/309  lung]
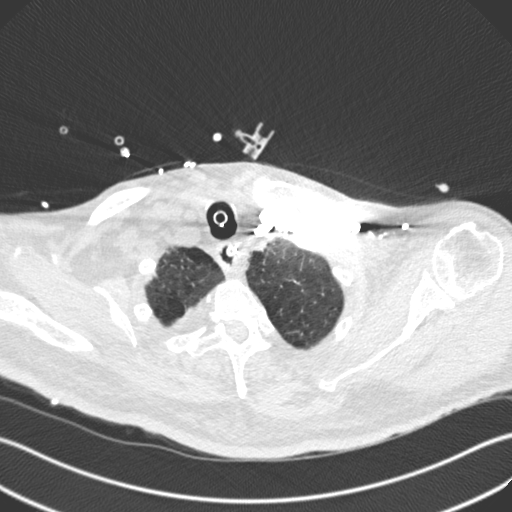
[im 291/309  soft-tissue]
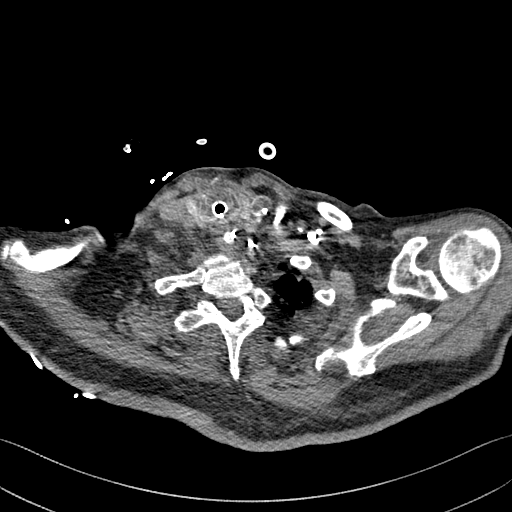

[Series 8: coronal mpr · coronal · 0.60mm/px · 3 of 151 slices shown]
[im 38/151  soft-tissue]
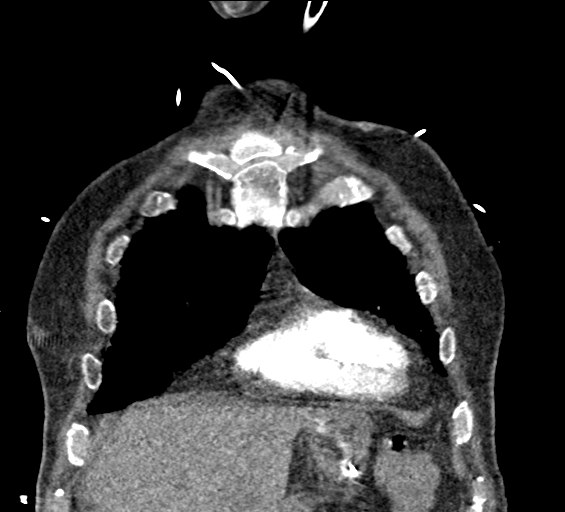
[im 76/151  soft-tissue]
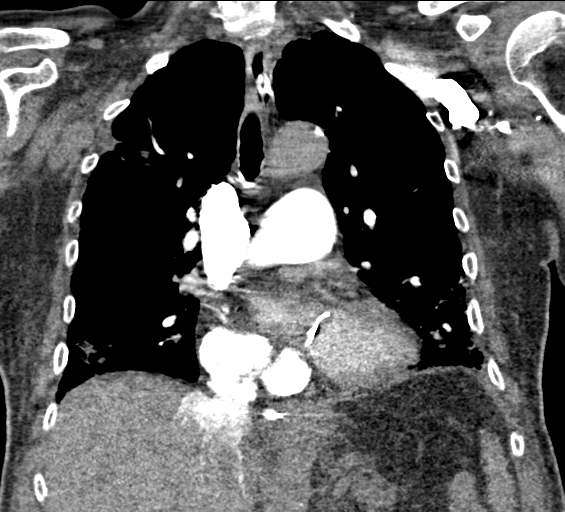
[im 113/151  soft-tissue]
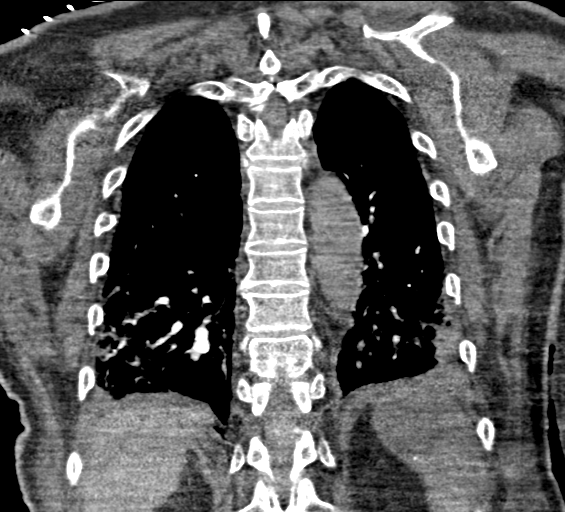

[17 of 46 positions shown; findings below may reference images not displayed]

FINDINGS: Cardiovascular: Contrast opacification of the pulmonary arteries is
excellent. Filling defect involving the LOWER LOBE pulmonary artery
extending into several segmental and subsegmental branches. Filling
defect involving the RIGHT interlobar pulmonary artery with
extension into the RIGHT MIDDLE LOBE pulmonary artery and the RIGHT
LOWER LOBE pulmonary artery and several segmental branches. Overall
clot burden is moderate. RV/LV ratio measures 1.3, indicating RIGHT
heart strain.

Cardiac silhouette moderately enlarged. No pericardial effusion.
Mild LAD coronary atherosclerosis. Mild atherosclerosis involving
the thoracic and proximal abdominal aorta without evidence of
aneurysm. Reflux of contrast from the RIGHT atrium into the
intrahepatic IVC and hepatic veins.

Mediastinum/Nodes: No pathologically enlarged mediastinal, hilar or
axillary lymph nodes. No mediastinal masses. Fluid-filled esophagus.
Normal-appearing thyroid gland.

Lungs/Pleura: Endotracheal tube in satisfactory position with its
tip approximately 4-5 cm above the carina. Emphysematous changes
throughout both lungs as noted previously. Residual scattered
peripheral airspace opacities throughout both lungs when compared to
the 08/28/2019 examination, with interval improvement. Densely
calcified RIGHT LOWER LOBE granuloma as noted previously. Very small
BILATERAL pleural effusions.

Upper Abdomen: Nasogastric tube courses into the stomach. Visualized
upper abdomen unremarkable.

Musculoskeletal: Multilevel degenerative disc disease and
spondylosis involving the mid and lower thoracic spine. No acute
findings.

Review of the MIP images confirms the above findings.
IMPRESSION: 1. Acute BILATERAL pulmonary emboli. Overall clot burden is
moderate.
2. CT evidence of RIGHT heart strain (RV/LV Ratio = 1.3) indicating
at least submassive (intermediate risk) PE. The presence of right
heart strain has been associated with an increased risk of morbidity
and mortality.
3. Residual scattered peripheral airspace opacities throughout both
lungs since the 08/28/2019 CT, indicating persistent pneumonia, with
interval improvement.
4. Very small BILATERAL pleural effusions.

Aortic Atherosclerosis (MZ5EC-BKE.E) and Emphysema ([id number]-[id number].R).

These results will be called to the ordering clinician or
representative by the Radiologist Assistant, and communication
documented in the PACS or zVision Dashboard.
